# Patient Record
Sex: Female | Born: 1985 | Race: Black or African American | Hispanic: No | Marital: Single | Smoking: Former smoker
Health system: Southern US, Community
[De-identification: ages and names within clinical notes are randomized; demographics above are authoritative.]

## PROBLEM LIST (undated history)

## (undated) ENCOUNTER — Inpatient Hospital Stay (HOSPITAL_COMMUNITY): Payer: Self-pay

## (undated) DIAGNOSIS — R569 Unspecified convulsions: Secondary | ICD-10-CM

## (undated) DIAGNOSIS — J45909 Unspecified asthma, uncomplicated: Secondary | ICD-10-CM

## (undated) DIAGNOSIS — N809 Endometriosis, unspecified: Secondary | ICD-10-CM

## (undated) DIAGNOSIS — R87629 Unspecified abnormal cytological findings in specimens from vagina: Secondary | ICD-10-CM

## (undated) DIAGNOSIS — N812 Incomplete uterovaginal prolapse: Secondary | ICD-10-CM

## (undated) DIAGNOSIS — R102 Pelvic and perineal pain: Secondary | ICD-10-CM

## (undated) DIAGNOSIS — N926 Irregular menstruation, unspecified: Secondary | ICD-10-CM

## (undated) DIAGNOSIS — Z3490 Encounter for supervision of normal pregnancy, unspecified, unspecified trimester: Secondary | ICD-10-CM

## (undated) HISTORY — DX: Endometriosis, unspecified: N80.9

## (undated) HISTORY — PX: LEEP: SHX91

## (undated) HISTORY — DX: Incomplete uterovaginal prolapse: N81.2

## (undated) HISTORY — DX: Pelvic and perineal pain: R10.2

## (undated) HISTORY — DX: Unspecified convulsions: R56.9

## (undated) HISTORY — DX: Encounter for supervision of normal pregnancy, unspecified, unspecified trimester: Z34.90

## (undated) HISTORY — DX: Irregular menstruation, unspecified: N92.6

## (undated) HISTORY — DX: Unspecified abnormal cytological findings in specimens from vagina: R87.629

---
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
## 2003-05-15 HISTORY — PX: OTHER SURGICAL HISTORY: SHX169

## 2005-06-07 ENCOUNTER — Emergency Department (HOSPITAL_COMMUNITY): Admission: EM | Admit: 2005-06-07 | Discharge: 2005-06-07 | Payer: Self-pay | Admitting: Emergency Medicine

## 2011-09-04 DIAGNOSIS — T782XXA Anaphylactic shock, unspecified, initial encounter: Secondary | ICD-10-CM | POA: Insufficient documentation

## 2011-09-04 HISTORY — DX: Anaphylactic shock, unspecified, initial encounter: T78.2XXA

## 2012-07-13 ENCOUNTER — Encounter (HOSPITAL_COMMUNITY): Payer: Self-pay | Admitting: Emergency Medicine

## 2012-07-13 ENCOUNTER — Emergency Department (HOSPITAL_COMMUNITY)
Admission: EM | Admit: 2012-07-13 | Discharge: 2012-07-13 | Disposition: A | Discharge status: Home / Self Care | Payer: Medicaid Other | Attending: Emergency Medicine | Admitting: Emergency Medicine

## 2012-07-13 ENCOUNTER — Emergency Department (HOSPITAL_COMMUNITY): Payer: Medicaid Other

## 2012-07-13 DIAGNOSIS — Y9289 Other specified places as the place of occurrence of the external cause: Secondary | ICD-10-CM | POA: Insufficient documentation

## 2012-07-13 DIAGNOSIS — S93409A Sprain of unspecified ligament of unspecified ankle, initial encounter: Secondary | ICD-10-CM | POA: Insufficient documentation

## 2012-07-13 DIAGNOSIS — Y936A Activity, physical games generally associated with school recess, summer camp and children: Secondary | ICD-10-CM | POA: Insufficient documentation

## 2012-07-13 DIAGNOSIS — X500XXA Overexertion from strenuous movement or load, initial encounter: Secondary | ICD-10-CM | POA: Insufficient documentation

## 2012-07-13 DIAGNOSIS — R296 Repeated falls: Secondary | ICD-10-CM | POA: Insufficient documentation

## 2012-07-13 DIAGNOSIS — F17200 Nicotine dependence, unspecified, uncomplicated: Secondary | ICD-10-CM | POA: Insufficient documentation

## 2012-07-13 MED ORDER — IBUPROFEN 600 MG PO TABS: 600.0000 mg | ORAL_TABLET | Freq: Four times a day (QID) | ORAL | Status: DC | PRN

## 2012-07-13 MED ORDER — HYDROCODONE-ACETAMINOPHEN 5-325 MG PO TABS: 1.0000 | ORAL_TABLET | ORAL | Status: DC | PRN

## 2012-07-13 MED ORDER — HYDROCODONE-ACETAMINOPHEN 5-325 MG PO TABS
1.0000 | ORAL_TABLET | Freq: Once | ORAL | Status: AC
  Administered 2012-07-13: 1 via ORAL
  Filled 2012-07-13: qty 1

## 2012-07-13 NOTE — ED Notes (Signed)
Pt c/o right ankle pain/swelling after falling while playing kickball Friday.

## 2012-07-13 NOTE — ED Provider Notes (Signed)
History     CSN: [id number]  Arrival date & time 07/13/12  0902   First MD Initiated Contact with Patient 07/13/12 [phone]      Chief Complaint  Patient presents with  . Ankle Pain    (Consider location/radiation/quality/duration/timing/severity/associated sxs/prior treatment) HPI Comments: Valerie Huber is [name] presenting with right lateral ankle pain,  Swelling and bruising which has been progressively worsened since she rolled the ankle 2 days ago while playing kickball with her children.  She had mild pain yesterday which has worsened today.  She has minimized weight bearing today but pain is constant, sharp, and does not radiate,  Even at rest.  She has taken no medications prior to arrival.  She is visiting her family here,  And plans to go back to her home in [place] in 1 week.     The history is provided by the patient.    History reviewed. No pertinent past medical history.  History reviewed. No pertinent past surgical history.  No family history on file.  History  Substance Use Topics  . Smoking status: Current Every Day Smoker    Types: Cigarettes  . Smokeless tobacco: Not on file  . Alcohol Use: No    OB History   Grav Para Term Preterm Abortions TAB SAB Ect Mult Living                  Review of Systems  Musculoskeletal: Positive for joint swelling and arthralgias.  Skin: Negative for wound.  Neurological: Negative for weakness and numbness.    Allergies  Review of patient's allergies indicates no known allergies.  Home Medications   Current Outpatient Rx  Name  Route  Sig  Dispense  Refill  . HYDROcodone-acetaminophen (NORCO/VICODIN) 5-325 MG per tablet   Oral   Take 1 tablet by mouth every 4 (four) hours as needed for pain.   12 tablet   0   . ibuprofen (ADVIL,MOTRIN) 600 MG tablet   Oral   Take 1 tablet (600 mg total) by mouth every 6 (six) hours as needed for pain.   30 tablet   0     BP 132/80  Pulse 97  Temp(Src) 98  F (36.7 C)  Resp 20  Ht 5\' 4"  (1.626 m)  Wt 145 lb (65.772 kg)  BMI 24.88 kg/m2  SpO2 100%  LMP 06/29/2012  Physical Exam  Nursing note and vitals reviewed. Constitutional: She appears well-developed and well-nourished.  HENT:  Head: Normocephalic.  Cardiovascular: Normal rate and intact distal pulses.  Exam reveals no decreased pulses.   Pulses:      Dorsalis pedis pulses are 2+ on the right side, and 2+ on the left side.       Posterior tibial pulses are 2+ on the right side, and 2+ on the left side.  Musculoskeletal: She exhibits edema and tenderness.       Right ankle: She exhibits decreased range of motion, swelling and ecchymosis. She exhibits normal pulse. Tenderness. Lateral malleolus tenderness found. No head of 5th metatarsal and no proximal fibula tenderness found. Achilles tendon normal.  Neurological: She is alert. No sensory deficit.  Skin: Skin is warm, dry and intact.    ED Course  Procedures (including critical care time)  Labs Reviewed - No data to display Dg Ankle Complete Right  07/13/2012  *RADIOLOGY REPORT*  Clinical Data: Pain, swelling.  RIGHT ANKLE - COMPLETE 3+ VIEW  Comparison: None.  Findings: Spurring noted along the medial  talus, likely sequelae of old injury.  I see no acute fracture.  Joint spaces are maintained. Soft tissues are intact.  No subluxation or dislocation.  IMPRESSION: No acute bony abnormality.   Original Report Authenticated By: Charlett Nose, M.D.      1. Ankle sprain and strain, right, initial encounter       MDM  Pt placed in aso,  Crutches supplied.  Hydrocodone and ibuprofen prescribed.  Encouraged RICE,  Recheck by pcp in 1 week if not improved.    Patients labs and/or radiological studies were reviewed during the medical decision making and disposition process.         [name], PA 07/13/12 1622

## 2012-07-15 NOTE — ED Provider Notes (Signed)
Medical screening examination/treatment/procedure(s) were performed by non-physician practitioner and as supervising physician I was immediately available for consultation/collaboration.   Joseph L Zammit, MD 07/15/12 1439 

## 2012-08-14 ENCOUNTER — Emergency Department (HOSPITAL_COMMUNITY): Payer: Medicaid Other

## 2012-08-14 ENCOUNTER — Emergency Department (HOSPITAL_COMMUNITY)
Admission: EM | Admit: 2012-08-14 | Discharge: 2012-08-14 | Disposition: A | Discharge status: Home / Self Care | Payer: Medicaid Other | Attending: Emergency Medicine | Admitting: Emergency Medicine

## 2012-08-14 ENCOUNTER — Encounter (HOSPITAL_COMMUNITY): Payer: Self-pay | Admitting: *Deleted

## 2012-08-14 DIAGNOSIS — S0083XA Contusion of other part of head, initial encounter: Secondary | ICD-10-CM | POA: Insufficient documentation

## 2012-08-14 DIAGNOSIS — Z79899 Other long term (current) drug therapy: Secondary | ICD-10-CM | POA: Insufficient documentation

## 2012-08-14 DIAGNOSIS — S8000XA Contusion of unspecified knee, initial encounter: Secondary | ICD-10-CM | POA: Insufficient documentation

## 2012-08-14 DIAGNOSIS — S0003XA Contusion of scalp, initial encounter: Secondary | ICD-10-CM | POA: Insufficient documentation

## 2012-08-14 DIAGNOSIS — F17200 Nicotine dependence, unspecified, uncomplicated: Secondary | ICD-10-CM | POA: Insufficient documentation

## 2012-08-14 DIAGNOSIS — S8002XA Contusion of left knee, initial encounter: Secondary | ICD-10-CM

## 2012-08-14 MED ORDER — HYDROCODONE-ACETAMINOPHEN 5-325 MG PO TABS
1.0000 | ORAL_TABLET | ORAL | Status: DC | PRN
Start: 2012-08-14 — End: 2012-11-16

## 2012-08-14 MED ORDER — IBUPROFEN 600 MG PO TABS: 600.0000 mg | ORAL_TABLET | Freq: Four times a day (QID) | ORAL | Status: DC | PRN
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
## 2012-08-15 NOTE — ED Provider Notes (Signed)
Medical screening examination/treatment/procedure(s) were performed by non-physician practitioner and as supervising physician I was immediately available for consultation/collaboration.   Dione Booze, MD 08/15/12 [phone]

## 2012-10-31 ENCOUNTER — Emergency Department (HOSPITAL_COMMUNITY)
Admission: EM | Admit: 2012-10-31 | Discharge: 2012-10-31 | Disposition: A | Discharge status: Home / Self Care | Payer: Medicaid Other | Attending: Emergency Medicine | Admitting: Emergency Medicine

## 2012-10-31 ENCOUNTER — Encounter (HOSPITAL_COMMUNITY): Payer: Self-pay

## 2012-10-31 DIAGNOSIS — Y92410 Unspecified street and highway as the place of occurrence of the external cause: Secondary | ICD-10-CM | POA: Insufficient documentation

## 2012-10-31 DIAGNOSIS — S161XXA Strain of muscle, fascia and tendon at neck level, initial encounter: Secondary | ICD-10-CM

## 2012-10-31 DIAGNOSIS — S0993XA Unspecified injury of face, initial encounter: Secondary | ICD-10-CM | POA: Diagnosis present

## 2012-10-31 DIAGNOSIS — F17200 Nicotine dependence, unspecified, uncomplicated: Secondary | ICD-10-CM | POA: Insufficient documentation

## 2012-10-31 DIAGNOSIS — Y9389 Activity, other specified: Secondary | ICD-10-CM | POA: Diagnosis not present

## 2012-10-31 DIAGNOSIS — S139XXA Sprain of joints and ligaments of unspecified parts of neck, initial encounter: Secondary | ICD-10-CM | POA: Diagnosis not present

## 2012-10-31 MED ORDER — HYDROCODONE-ACETAMINOPHEN 5-325 MG PO TABS: 1.0000 | ORAL_TABLET | ORAL | Status: DC | PRN

## 2012-10-31 MED ORDER — IBUPROFEN 800 MG PO TABS: 800.0000 mg | ORAL_TABLET | Freq: Three times a day (TID) | ORAL | Status: DC

## 2012-10-31 MED ORDER — CYCLOBENZAPRINE HCL 5 MG PO TABS: 5.0000 mg | ORAL_TABLET | Freq: Three times a day (TID) | ORAL | Status: DC | PRN
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
## 2012-11-01 NOTE — ED Provider Notes (Signed)
Medical screening examination/treatment/procedure(s) were performed by non-physician practitioner and as supervising physician I was immediately available for consultation/collaboration.   Ala Capri W. Faiza Bansal, MD 11/01/12 1549 
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
## 2012-11-16 ENCOUNTER — Encounter (HOSPITAL_COMMUNITY): Payer: Self-pay | Admitting: Emergency Medicine

## 2012-11-16 ENCOUNTER — Emergency Department (HOSPITAL_COMMUNITY)
Admission: EM | Admit: 2012-11-16 | Discharge: 2012-11-16 | Discharge status: Against Medical Advice | Payer: Medicaid Other | Attending: Emergency Medicine | Admitting: Emergency Medicine

## 2012-11-16 ENCOUNTER — Emergency Department (HOSPITAL_COMMUNITY): Payer: Medicaid Other

## 2012-11-16 DIAGNOSIS — F17200 Nicotine dependence, unspecified, uncomplicated: Secondary | ICD-10-CM | POA: Insufficient documentation

## 2012-11-16 DIAGNOSIS — M79609 Pain in unspecified limb: Secondary | ICD-10-CM | POA: Insufficient documentation

## 2012-11-16 DIAGNOSIS — Z79899 Other long term (current) drug therapy: Secondary | ICD-10-CM | POA: Insufficient documentation

## 2012-11-16 DIAGNOSIS — Z791 Long term (current) use of non-steroidal anti-inflammatories (NSAID): Secondary | ICD-10-CM | POA: Insufficient documentation

## 2012-11-16 DIAGNOSIS — T07XXXA Unspecified multiple injuries, initial encounter: Secondary | ICD-10-CM

## 2012-11-16 DIAGNOSIS — R233 Spontaneous ecchymoses: Secondary | ICD-10-CM | POA: Insufficient documentation

## 2012-11-16 LAB — COMPREHENSIVE METABOLIC PANEL
ALT: 10 U/L (ref 0–35)
Alkaline Phosphatase: 49 U/L (ref 39–117)
BUN: 7 mg/dL (ref 6–23)
CO2: 28 mEq/L (ref 19–32)
Chloride: 104 mEq/L (ref 96–112)
GFR calc Af Amer: >90 mL/min (ref >90)
GFR calc non Af Amer: >90 mL/min (ref >90)
Glucose, Bld: 87 mg/dL (ref 70–99)
Potassium: 3.9 mEq/L (ref 3.5–5.1)
Sodium: 139 mEq/L (ref 135–145)
Total Bilirubin: 0.2 mg/dL — ABNORMAL LOW (ref 0.3–1.2)
Total Protein: 7.4 g/dL (ref 6.0–8.3)

## 2012-11-16 LAB — CBC WITH DIFFERENTIAL/PLATELET
Hemoglobin: 11.8 g/dL — ABNORMAL LOW (ref 12.0–15.0)
Lymphocytes Relative: 47 % — ABNORMAL HIGH (ref 12–46)
Lymphs Abs: 2.5 10*3/uL (ref 0.7–4.0)
MCH: 32.3 pg (ref 26.0–34.0)
Monocytes Relative: 9 % (ref 3–12)
Neutro Abs: 2.3 10*3/uL (ref 1.7–7.7)
Neutrophils Relative %: 43 % (ref 43–77)
Platelets: 238 10*3/uL (ref 150–400)
RBC: 3.65 MIL/uL — ABNORMAL LOW (ref 3.87–5.11)
WBC: 5.2 10*3/uL (ref 4.0–10.5)
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
## 2012-11-16 NOTE — ED Provider Notes (Signed)
History    CSN: [id number] Arrival date & time 11/16/12  1143  First MD Initiated Contact with Patient 11/16/12 1314     Chief Complaint  Patient presents with  . Headache  . Leg Pain   (Consider location/radiation/quality/duration/timing/severity/associated sxs/prior Treatment) Patient is a 27 y.o. female presenting with headaches and leg pain. The history is provided by the patient (pt complains of a headache and bruising to her legs).  Headache Pain location:  Generalized Quality:  Dull Radiates to:  Does not radiate Severity currently:  4/10 Severity at highest:  6/10 Onset quality:  Gradual Timing:  Constant Progression:  Unchanged Chronicity:  New Context: not activity   Associated symptoms: no abdominal pain, no back pain, no congestion, no cough, no diarrhea, no fatigue, no seizures and no sinus pressure   Leg Pain Associated symptoms: no back pain and no fatigue    History reviewed. No pertinent past medical history. Past Surgical History  Procedure Laterality Date  . Surgery for endometriosis     History reviewed. No pertinent family history. History  Substance Use Topics  . Smoking status: Current Every Day Smoker    Types: Cigarettes  . Smokeless tobacco: Not on file  . Alcohol Use: No   OB History   Grav Para Term Preterm Abortions TAB SAB Ect Mult Living                 Review of Systems  Constitutional: Negative for appetite change and fatigue.  HENT: Negative for congestion, sinus pressure and ear discharge.   Eyes: Negative for discharge.  Respiratory: Negative for cough.   Cardiovascular: Negative for chest pain.  Gastrointestinal: Negative for abdominal pain and diarrhea.  Genitourinary: Negative for frequency and hematuria.  Musculoskeletal: Negative for back pain.       Bruising to legs  Skin: Negative for rash.  Neurological: Positive for headaches. Negative for seizures.  Psychiatric/Behavioral: Negative for hallucinations.     Allergies  Review of patient's allergies indicates no known allergies.  Home Medications   Current Outpatient Rx  Name  Route  Sig  Dispense  Refill  . albuterol (PROVENTIL HFA;VENTOLIN HFA) 108 (90 BASE) MCG/ACT inhaler   Inhalation   Inhale 2 puffs into the lungs every 6 (six) hours as needed for wheezing.         . cyclobenzaprine (FLEXERIL) 5 MG tablet   Oral   Take 1 tablet (5 mg total) by mouth 3 (three) times daily as needed for muscle spasms.   15 tablet   0   . HYDROcodone-acetaminophen (NORCO/VICODIN) 5-325 MG per tablet   Oral   Take 1 tablet by mouth every 4 (four) hours as needed for pain.   15 tablet   0   . ibuprofen (ADVIL,MOTRIN) 800 MG tablet   Oral   Take 1 tablet (800 mg total) by mouth 3 (three) times daily.   21 tablet   0    BP 133/84  Pulse 77  Temp(Src) 98.7 F (37.1 C) (Oral)  Resp 18  Ht 5\' 4"  (1.626 m)  Wt 138 lb (62.596 kg)  BMI 23.68 kg/m2  SpO2 100%  LMP 10/24/2012 Physical Exam  Constitutional: She is oriented to person, place, and time. She appears well-developed.  HENT:  Head: Normocephalic.  Eyes: Conjunctivae and EOM are normal. No scleral icterus.  Neck: Neck supple. No thyromegaly present.  Cardiovascular: Normal rate and regular rhythm.  Exam reveals no gallop and no friction rub.   No murmur  heard. Pulmonary/Chest: No stridor. She has no wheezes. She has no rales. She exhibits no tenderness.  Abdominal: She exhibits no distension. There is no tenderness. There is no rebound.  Musculoskeletal: Normal range of motion. She exhibits no edema.  Moderate bruising to both legs  Lymphadenopathy:    She has no cervical adenopathy.  Neurological: She is oriented to person, place, and time. Coordination normal.  Skin: No rash noted. No erythema.  Psychiatric: She has a normal mood and affect. Her behavior is normal.    ED Course  Procedures (including critical care time) Labs Reviewed  CBC WITH DIFFERENTIAL - Abnormal;  Notable for the following:    RBC 3.65 (*)    Hemoglobin 11.8 (*)    HCT 35.2 (*)    Lymphocytes Relative 47 (*)    All other components within normal limits  COMPREHENSIVE METABOLIC PANEL - Abnormal; Notable for the following:    Total Bilirubin 0.2 (*)    All other components within normal limits  URINALYSIS, ROUTINE W REFLEX MICROSCOPIC   No results found. 1. Multiple bruises     MDM    Benny Lennert, MD 11/16/12 1433

## 2012-11-26 ENCOUNTER — Emergency Department (HOSPITAL_COMMUNITY)
Admission: EM | Admit: 2012-11-26 | Discharge: 2012-11-26 | Disposition: A | Discharge status: Home / Self Care | Payer: Medicaid Other | Attending: Emergency Medicine | Admitting: Emergency Medicine

## 2012-11-26 ENCOUNTER — Encounter (HOSPITAL_COMMUNITY): Payer: Self-pay | Admitting: Emergency Medicine

## 2012-11-26 ENCOUNTER — Emergency Department (HOSPITAL_COMMUNITY): Payer: Medicaid Other

## 2012-11-26 DIAGNOSIS — Y936A Activity, physical games generally associated with school recess, summer camp and children: Secondary | ICD-10-CM | POA: Insufficient documentation

## 2012-11-26 DIAGNOSIS — F17200 Nicotine dependence, unspecified, uncomplicated: Secondary | ICD-10-CM | POA: Insufficient documentation

## 2012-11-26 DIAGNOSIS — S93409A Sprain of unspecified ligament of unspecified ankle, initial encounter: Secondary | ICD-10-CM | POA: Insufficient documentation

## 2012-11-26 DIAGNOSIS — X500XXA Overexertion from strenuous movement or load, initial encounter: Secondary | ICD-10-CM | POA: Insufficient documentation

## 2012-11-26 DIAGNOSIS — S93401A Sprain of unspecified ligament of right ankle, initial encounter: Secondary | ICD-10-CM

## 2012-11-26 DIAGNOSIS — J45909 Unspecified asthma, uncomplicated: Secondary | ICD-10-CM | POA: Insufficient documentation

## 2012-11-26 DIAGNOSIS — Y929 Unspecified place or not applicable: Secondary | ICD-10-CM | POA: Insufficient documentation

## 2012-11-26 DIAGNOSIS — Z79899 Other long term (current) drug therapy: Secondary | ICD-10-CM | POA: Insufficient documentation

## 2012-11-26 HISTORY — DX: Unspecified asthma, uncomplicated: J45.909

## 2012-11-26 MED ORDER — OXYCODONE-ACETAMINOPHEN 5-325 MG PO TABS: 1.0000 | ORAL_TABLET | Freq: Four times a day (QID) | ORAL | Status: DC | PRN

## 2012-11-26 NOTE — ED Provider Notes (Signed)
   History    CSN: [id number] Arrival date & time 11/26/12  1022  First MD Initiated Contact with Patient 11/26/12 1037     Chief Complaint  Patient presents with  . Ankle Injury   (Consider location/radiation/quality/duration/timing/severity/associated sxs/prior Treatment) HPI  SUBJECTIVE: Valerie Huber is a 27 y.o. female who complains of inversion injury to the right ankle 3 day(s) ago. Immediate symptoms: immediate pain, immediate swelling, inability to bear weight directly after injury, no deformity was noted by the patient. Symptoms have been acute since that time. Prior history of related problems: previous foot/ankle injury (previous sprain). There is pain and swelling at the lateral aspect of that ankle.  Denies numbness or tingling. Able to bear weight but it causes sig. Pain. Low dose advil without relief. Patient using home brace with no relief.  Past Medical History  Diagnosis Date  . Asthma    Past Surgical History  Procedure Laterality Date  . Surgery for endometriosis     No family history on file. History  Substance Use Topics  . Smoking status: Current Every Day Smoker    Types: Cigarettes  . Smokeless tobacco: Not on file  . Alcohol Use: No   OB History   Grav Para Term Preterm Abortions TAB SAB Ect Mult Living                 Review of Systems  Allergies  Review of patient's allergies indicates no known allergies.  Home Medications   Current Outpatient Rx  Name  Route  Sig  Dispense  Refill  . cyclobenzaprine (FLEXERIL) 5 MG tablet   Oral   Take 1 tablet (5 mg total) by mouth 3 (three) times daily as needed for muscle spasms.   15 tablet   0   . ibuprofen (ADVIL,MOTRIN) 800 MG tablet   Oral   Take 1 tablet (800 mg total) by mouth 3 (three) times daily.   21 tablet   0   . albuterol (PROVENTIL HFA;VENTOLIN HFA) 108 (90 BASE) MCG/ACT inhaler   Inhalation   Inhale 2 puffs into the lungs every 6 (six) hours as needed for wheezing.           BP 115/81  Pulse 85  Temp(Src) 98.1 F (36.7 C) (Oral)  SpO2 100%  LMP 10/16/2012 Physical Exam She appears well, vital signs are normal.  CV: RRR, No M/R/G, Peripheral pulses intact. No peripheral edema. Lungs: CTAB Abd: Soft, Non tender, non distended There is swelling, ecchymosis, and tenderness over the lateral malleolus. No tenderness over the medial aspect of the ankle. The fifth metatarsal is tender. The ankle joint is intact without excessive opening on stressing.  TTP over the distal fibula.  ED Course  Procedures (including critical care time) Labs Reviewed - No data to display No results found. 1. Ankle sprain, right, initial encounter     MDM  11:05 AM BP 115/81  Pulse 85  Temp(Src) 98.1 F (36.7 C) (Oral)  SpO2 100%  LMP 10/16/2012 Imaging ordered. [place] ankle rules suggest imaging required.   No fissures or dislocations. We'll treat with Cam Walker boot pain meds followup at [facility].The patient appears reasonably screened and/or stabilized for discharge and I doubt any other medical condition or other [facility] requiring further screening, evaluation, or treatment in the ED at this time prior to discharge.   Arthor Captain, PA-C 11/26/12 1211

## 2012-11-26 NOTE — ED Notes (Signed)
Pt injured right ankle while playing kick-Valerie Huber 3 days ago. Right ankle swollen and discolored.

## 2012-11-26 NOTE — ED Provider Notes (Signed)
Medical screening examination/treatment/procedure(s) were performed by non-physician practitioner and as supervising physician I was immediately available for consultation/collaboration.   Yuriana Gaal, MD 11/26/12 1511 

## 2012-12-25 ENCOUNTER — Emergency Department (HOSPITAL_COMMUNITY)
Admission: EM | Admit: 2012-12-25 | Discharge: 2012-12-25 | Disposition: A | Discharge status: Home / Self Care | Payer: Medicaid Other | Attending: Emergency Medicine | Admitting: Emergency Medicine

## 2012-12-25 ENCOUNTER — Emergency Department (HOSPITAL_COMMUNITY): Payer: Medicaid Other

## 2012-12-25 ENCOUNTER — Encounter (HOSPITAL_COMMUNITY): Payer: Self-pay | Admitting: Emergency Medicine

## 2012-12-25 DIAGNOSIS — Y9289 Other specified places as the place of occurrence of the external cause: Secondary | ICD-10-CM | POA: Insufficient documentation

## 2012-12-25 DIAGNOSIS — Z3202 Encounter for pregnancy test, result negative: Secondary | ICD-10-CM | POA: Insufficient documentation

## 2012-12-25 DIAGNOSIS — Z79899 Other long term (current) drug therapy: Secondary | ICD-10-CM | POA: Insufficient documentation

## 2012-12-25 DIAGNOSIS — S139XXA Sprain of joints and ligaments of unspecified parts of neck, initial encounter: Secondary | ICD-10-CM | POA: Insufficient documentation

## 2012-12-25 DIAGNOSIS — S161XXA Strain of muscle, fascia and tendon at neck level, initial encounter: Secondary | ICD-10-CM

## 2012-12-25 DIAGNOSIS — J45909 Unspecified asthma, uncomplicated: Secondary | ICD-10-CM | POA: Insufficient documentation

## 2012-12-25 DIAGNOSIS — Y9389 Activity, other specified: Secondary | ICD-10-CM | POA: Insufficient documentation

## 2012-12-25 DIAGNOSIS — F17200 Nicotine dependence, unspecified, uncomplicated: Secondary | ICD-10-CM | POA: Insufficient documentation

## 2012-12-25 LAB — POCT PREGNANCY, URINE: Preg Test, Ur: NEGATIVE

## 2012-12-25 MED ORDER — NAPROXEN 500 MG PO TABS: 500.0000 mg | ORAL_TABLET | Freq: Two times a day (BID) | ORAL | Status: DC

## 2012-12-25 MED ORDER — METHOCARBAMOL 500 MG PO TABS: ORAL_TABLET | ORAL | Status: DC
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
## 2012-12-25 NOTE — Progress Notes (Signed)
ED/CM noted patient did not have health insurance and/or PCP listed in the computer.  Patient was given the [place] resource handout with information on the clinics, food pantries, and the handout for new health insurance sign-up.  Patient expressed appreciation for this. 
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
## 2012-12-26 NOTE — ED Provider Notes (Signed)
Medical screening examination/treatment/procedure(s) were performed by non-physician practitioner and as supervising physician I was immediately available for consultation/collaboration.    Brett Soza D Karren Newland, MD 12/26/12 0933 

## 2013-01-27 ENCOUNTER — Emergency Department (HOSPITAL_COMMUNITY)
Admission: EM | Admit: 2013-01-27 | Discharge: 2013-01-27 | Discharge status: Against Medical Advice | Payer: Medicaid Other | Attending: Emergency Medicine | Admitting: Emergency Medicine

## 2013-01-27 ENCOUNTER — Encounter (HOSPITAL_COMMUNITY): Payer: Self-pay

## 2013-01-27 DIAGNOSIS — R634 Abnormal weight loss: Secondary | ICD-10-CM | POA: Insufficient documentation

## 2013-01-27 DIAGNOSIS — N898 Other specified noninflammatory disorders of vagina: Secondary | ICD-10-CM | POA: Insufficient documentation

## 2013-01-27 DIAGNOSIS — F17200 Nicotine dependence, unspecified, uncomplicated: Secondary | ICD-10-CM | POA: Insufficient documentation

## 2013-01-27 NOTE — ED Notes (Signed)
Pt told registration she was leaving and will come back later.

## 2013-01-27 NOTE — ED Notes (Signed)
Pt reports has been having abnormal vaginal bleeding for the past year.  Reports her periods have been lasting approx 16 days and are very heavy.  Also reports has lost approx 22lb in the past month without trying.  Reports decreased appetite and when does eat, she vomits.  Pt says she just drinks water.

## 2013-02-09 ENCOUNTER — Emergency Department (HOSPITAL_COMMUNITY)
Admission: EM | Admit: 2013-02-09 | Discharge: 2013-02-09 | Disposition: A | Discharge status: Home / Self Care | Payer: Medicaid Other | Attending: Emergency Medicine | Admitting: Emergency Medicine

## 2013-02-09 ENCOUNTER — Emergency Department (HOSPITAL_COMMUNITY): Payer: Medicaid Other

## 2013-02-09 ENCOUNTER — Encounter (HOSPITAL_COMMUNITY): Payer: Self-pay | Admitting: *Deleted

## 2013-02-09 DIAGNOSIS — S2020XA Contusion of thorax, unspecified, initial encounter: Secondary | ICD-10-CM | POA: Insufficient documentation

## 2013-02-09 DIAGNOSIS — Z3202 Encounter for pregnancy test, result negative: Secondary | ICD-10-CM | POA: Insufficient documentation

## 2013-02-09 DIAGNOSIS — Z791 Long term (current) use of non-steroidal anti-inflammatories (NSAID): Secondary | ICD-10-CM | POA: Insufficient documentation

## 2013-02-09 DIAGNOSIS — S298XXA Other specified injuries of thorax, initial encounter: Secondary | ICD-10-CM | POA: Insufficient documentation

## 2013-02-09 DIAGNOSIS — J45901 Unspecified asthma with (acute) exacerbation: Secondary | ICD-10-CM | POA: Insufficient documentation

## 2013-02-09 DIAGNOSIS — Z79899 Other long term (current) drug therapy: Secondary | ICD-10-CM | POA: Insufficient documentation

## 2013-02-09 DIAGNOSIS — F17200 Nicotine dependence, unspecified, uncomplicated: Secondary | ICD-10-CM | POA: Insufficient documentation

## 2013-02-09 LAB — URINALYSIS, ROUTINE W REFLEX MICROSCOPIC
Bilirubin Urine: NEGATIVE
Ketones, ur: 15 mg/dL — AB
Specific Gravity, Urine: >1.03 — ABNORMAL HIGH (ref 1.005–1.030)
Urobilinogen, UA: 0.2 mg/dL (ref 0.0–1.0)

## 2013-02-09 LAB — URINE MICROSCOPIC-ADD ON

## 2013-02-09 LAB — POCT PREGNANCY, URINE: Preg Test, Ur: NEGATIVE

## 2013-02-09 MED ORDER — IBUPROFEN 600 MG PO TABS: 600.0000 mg | ORAL_TABLET | Freq: Four times a day (QID) | ORAL | Status: DC | PRN

## 2013-02-09 MED ORDER — OXYCODONE-ACETAMINOPHEN 5-325 MG PO TABS: 1.0000 | ORAL_TABLET | ORAL | Status: DC | PRN

## 2013-02-09 MED ORDER — HYDROMORPHONE HCL PF 2 MG/ML IJ SOLN
2.0000 mg | Freq: Once | INTRAMUSCULAR | Status: AC
  Administered 2013-02-09: 2 mg via INTRAMUSCULAR
  Filled 2013-02-09: qty 1
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
## 2013-02-09 NOTE — ED Notes (Signed)
MD at bedside. 
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
## 2013-07-28 ENCOUNTER — Emergency Department (HOSPITAL_COMMUNITY)
Admission: EM | Admit: 2013-07-28 | Discharge: 2013-07-28 | Disposition: A | Discharge status: Home / Self Care | Payer: Medicaid Other | Attending: Emergency Medicine | Admitting: Emergency Medicine

## 2013-07-28 ENCOUNTER — Encounter (HOSPITAL_COMMUNITY): Payer: Self-pay | Admitting: Emergency Medicine

## 2013-07-28 ENCOUNTER — Emergency Department (HOSPITAL_COMMUNITY): Payer: Medicaid Other

## 2013-07-28 DIAGNOSIS — J45909 Unspecified asthma, uncomplicated: Secondary | ICD-10-CM | POA: Insufficient documentation

## 2013-07-28 DIAGNOSIS — S8990XA Unspecified injury of unspecified lower leg, initial encounter: Secondary | ICD-10-CM | POA: Insufficient documentation

## 2013-07-28 DIAGNOSIS — S8010XA Contusion of unspecified lower leg, initial encounter: Secondary | ICD-10-CM | POA: Insufficient documentation

## 2013-07-28 DIAGNOSIS — F17200 Nicotine dependence, unspecified, uncomplicated: Secondary | ICD-10-CM | POA: Insufficient documentation

## 2013-07-28 DIAGNOSIS — M25569 Pain in unspecified knee: Secondary | ICD-10-CM

## 2013-07-28 DIAGNOSIS — S99929A Unspecified injury of unspecified foot, initial encounter: Principal | ICD-10-CM

## 2013-07-28 DIAGNOSIS — F411 Generalized anxiety disorder: Secondary | ICD-10-CM | POA: Insufficient documentation

## 2013-07-28 DIAGNOSIS — Y9301 Activity, walking, marching and hiking: Secondary | ICD-10-CM | POA: Insufficient documentation

## 2013-07-28 DIAGNOSIS — Z79899 Other long term (current) drug therapy: Secondary | ICD-10-CM | POA: Insufficient documentation

## 2013-07-28 DIAGNOSIS — R296 Repeated falls: Secondary | ICD-10-CM | POA: Insufficient documentation

## 2013-07-28 DIAGNOSIS — Y92838 Other recreation area as the place of occurrence of the external cause: Secondary | ICD-10-CM

## 2013-07-28 DIAGNOSIS — S99919A Unspecified injury of unspecified ankle, initial encounter: Principal | ICD-10-CM

## 2013-07-28 DIAGNOSIS — Y9239 Other specified sports and athletic area as the place of occurrence of the external cause: Secondary | ICD-10-CM | POA: Insufficient documentation

## 2013-07-28 MED ORDER — KETOROLAC TROMETHAMINE 60 MG/2ML IM SOLN
60.0000 mg | Freq: Once | INTRAMUSCULAR | Status: AC
  Administered 2013-07-28: 60 mg via INTRAMUSCULAR
  Filled 2013-07-28: qty 2

## 2013-07-28 MED ORDER — HYDROCODONE-ACETAMINOPHEN 5-325 MG PO TABS: 1.0000 | ORAL_TABLET | Freq: Four times a day (QID) | ORAL | Status: DC | PRN

## 2013-07-28 NOTE — ED Notes (Signed)
Pt reports was out walking her little dog and a big dog started chasing them.  Reports fell on pavement.  Pt has bruising and scratches to r lower leg.

## 2013-07-28 NOTE — ED Provider Notes (Signed)
CSN: [id number]     Arrival date & time 07/28/13  1520 History   First MD Initiated Contact with Patient 07/28/13 1613     Chief Complaint  Patient presents with  . Leg Pain     (Consider location/radiation/quality/duration/timing/severity/associated sxs/prior Treatment) Patient is a 28 y.o. female presenting with leg pain. The history is provided by the patient. No language interpreter was used.  Leg Pain Location:  Leg Injury: yes   Leg location:  R lower leg Pain details:    Quality:  Aching   Radiates to:  Does not radiate Dislocation: no   Associated symptoms: no back pain, no fever, no muscle weakness, no numbness and no tingling   Pt is a 28 year old female who was out in the park today walking her dog when a larger dog (Rottweiler) came after her and her dog. She was able to climb unde the bleachers but watched her dog get attacked. She reports that he dog was injured and this caused her a lot of anxiety. She also reports that she fell onto her right lower leg and she has bruises and pain in her right knee. She is ambulatory however, reports that it is very painful.   Past Medical History  Diagnosis Date  . Asthma    Past Surgical History  Procedure Laterality Date  . Surgery for endometriosis     Family History  Problem Relation Age of Onset  . Cancer Mother   . Cancer Other    History  Substance Use Topics  . Smoking status: Current Every Day Smoker -- 0.05 packs/day for 14 years    Types: Cigarettes  . Smokeless tobacco: Never Used  . Alcohol Use: No   OB History   Grav Para Term Preterm Abortions TAB SAB Ect Mult Living   3 1 1  2 2    1      Review of Systems  Constitutional: Negative for fever and chills.  Musculoskeletal: Positive for myalgias. Negative for back pain, gait problem and joint swelling.  Skin: Negative for rash.  Neurological: Negative for dizziness, weakness and numbness.  All other systems reviewed and are  negative.      Allergies  Flexeril  Home Medications   Current Outpatient Rx  Name  Route  Sig  Dispense  Refill  . acetaminophen (TYLENOL) 500 MG tablet   Oral   Take 1,000 mg by mouth daily as needed for mild pain, moderate pain or fever.         [name] albuterol (PROVENTIL HFA;VENTOLIN HFA) 108 (90 BASE) MCG/ACT inhaler   Inhalation   Inhale 2 puffs into the lungs every 6 (six) hours as needed for wheezing.         [name] EPINEPHrine (EPI-PEN) 0.3 mg/0.3 mL SOAJ injection   Intramuscular   Inject 0.3 mg into the muscle once.         [name] HYDROcodone-acetaminophen (NORCO/VICODIN) 5-325 MG per tablet   Oral   Take 1-2 tablets by mouth every 6 (six) hours as needed.   15 tablet   0    BP 123/84  Pulse 84  Temp(Src) 98.6 F (37 C) (Oral)  Resp 20  SpO2 95%  LMP 07/16/2013 Physical Exam  Nursing note and vitals reviewed. Constitutional: She is oriented to person, place, and time. She appears well-developed and well-nourished. No distress.  Well-appearing   HENT:  Head: Normocephalic and atraumatic.  Eyes: Conjunctivae and EOM are normal.  Neck: Normal range of motion. Neck  supple. No JVD present. No tracheal deviation present. No thyromegaly present.  Cardiovascular: Normal rate and normal heart sounds.   Pulmonary/Chest: Effort normal and breath sounds normal. No respiratory distress. She has no wheezes.  Musculoskeletal:  Right lower, leg painful and knee with limited ROM due to pain. No numbness or tingling. No focal weakness or deficits. Distal pulses 2+, capillary refill brisk.   Lymphadenopathy:    She has no cervical adenopathy.  Neurological: She is oriented to person, place, and time.  Skin: Skin is warm and dry. No rash noted.  Right shin with ecchymosis and small scratch  Psychiatric: She has a normal mood and affect. Her behavior is normal. Judgment and thought content normal.    ED Course  Procedures (including critical care time) Labs Review Labs  Reviewed - No data to display Imaging Review No results found.   EKG Interpretation None      MDM   Final diagnoses:  Knee pain    Right knee x-ray; no fracture or dislocation. Limited ROM of right knee due to pain and discomfort. No gross deformity or swelling. Distal pulses 2+, capillary refill brisk. Ace wrap applied to right knee for support. RICE instructions given. Discussed plan of care with pt and she agrees. Prescription for hydrocodone given. Follow-up with Dr. Luna Glasgow.        Elisha Headland, NP 08/01/13 1816

## 2013-07-28 NOTE — Discharge Instructions (Signed)
Knee Effusion The medical term for having fluid in your knee is effusion. This is often due to an internal derangement of the knee. This means something is wrong inside the knee. Some of the causes of fluid in the knee may be torn cartilage, a torn ligament, or bleeding into the joint from an injury. Your knee is likely more difficult to bend and move. This is often because there is increased pain and pressure in the joint. The time it takes for recovery from a knee effusion depends on different factors, including:   Type of injury.  Your age.  Physical and medical conditions.  Rehabilitation Strategies. How long you will be away from your normal activities will depend on what kind of knee problem you have and how much damage is present. Your knee has two types of cartilage. Articular cartilage covers the bone ends and lets your knee bend and move smoothly. Two menisci, thick pads of cartilage that form a rim inside the joint, help absorb shock and stabilize your knee. Ligaments bind the bones together and support your knee joint. Muscles move the joint, help support your knee, and take stress off the joint itself. CAUSES  Often an effusion in the knee is caused by an injury to one of the menisci. This is often a tear in the cartilage. Recovery after a meniscus injury depends on how much meniscus is damaged and whether you have damaged other knee tissue. Small tears may heal on their own with conservative treatment. Conservative means rest, limited weight bearing activity and muscle strengthening exercises. Your recovery may take up to 6 weeks.  TREATMENT  Larger tears may require surgery. Meniscus injuries may be treated during arthroscopy. Arthroscopy is a procedure in which your surgeon uses a small telescope like instrument to look in your knee. Your caregiver can make a more accurate diagnosis (learning what is wrong) by performing an arthroscopic procedure. If your injury is on the inner margin  of the meniscus, your surgeon may trim the meniscus back to a smooth rim. In other cases your surgeon will try to repair a damaged meniscus with stitches (sutures). This may make rehabilitation take longer, but may provide better long term result by helping your knee keep its shock absorption capabilities. Ligaments which are completely torn usually require surgery for repair. HOME CARE INSTRUCTIONS  Use crutches as instructed.  If a brace is applied, use as directed.  Once you are home, an ice pack applied to your swollen knee may help with discomfort and help decrease swelling.  Keep your knee raised (elevated) when you are not up and around or on crutches.  Only take over-the-counter or prescription medicines for pain, discomfort, or fever as directed by your caregiver.  Your caregivers will help with instructions for rehabilitation of your knee. This often includes strengthening exercises.  You may resume a normal diet and activities as directed. SEEK MEDICAL CARE IF:   There is increased swelling in your knee.  You notice redness, swelling, or increasing pain in your knee.  An unexplained oral temperature above 102 F (38.9 C) develops. SEEK IMMEDIATE MEDICAL CARE IF:   You develop a rash.  You have difficulty breathing.  You have any allergic reactions from medications you may have been given.  There is severe pain with any motion of the knee. MAKE SURE YOU:   Understand these instructions.  Will watch your condition.  Will get help right away if you are not doing well or get worse.  Document Released: 07/21/2003 Document Revised: 07/23/2011 Document Reviewed: 09/24/2007 [facility] Patient Information 2014 [employer].   RICE: Routine Care for Injuries The routine care of many injuries includes Rest, Ice, Compression, and Elevation (RICE). HOME CARE INSTRUCTIONS  Rest is needed to allow your body to heal. Routine activities can usually be resumed when  comfortable. Injured tendons and bones can take up to 6 weeks to heal. Tendons are the cord-like structures that attach muscle to bone.  Ice following an injury helps keep the swelling down and reduces pain.  Put ice in a plastic bag.  Place a towel between your skin and the bag.  Leave the ice on for 15-20 minutes, 03-04 times a day. Do this while awake, for the first 24 to 48 hours. After that, continue as directed by your caregiver.  Compression helps keep swelling down. It also gives support and helps with discomfort. If an elastic bandage has been applied, it should be removed and reapplied every 3 to 4 hours. It should not be applied tightly, but firmly enough to keep swelling down. Watch fingers or toes for swelling, bluish discoloration, coldness, numbness, or excessive pain. If any of these problems occur, remove the bandage and reapply loosely. Contact your caregiver if these problems continue.  Elevation helps reduce swelling and decreases pain. With extremities, such as the arms, hands, legs, and feet, the injured area should be placed near or above the level of the heart, if possible. SEEK IMMEDIATE MEDICAL CARE IF:  You have persistent pain and swelling.  You develop redness, numbness, or unexpected weakness.  Your symptoms are getting worse rather than improving after several days. These symptoms may indicate that further evaluation or further X-rays are needed. Sometimes, X-rays may not show a small broken bone (fracture) until 1 week or 10 days later. Make a follow-up appointment with your caregiver. Ask when your X-ray results will be ready. Make sure you get your X-ray results. Document Released: 08/12/2000 Document Revised: 07/23/2011 Document Reviewed: 09/29/2010 [facility] Patient Information 2014 [place], [place].   Follow RICE instructions Follow-up with Dr. Luna Glasgow, make appt within the week Ibuprofen for pain or discomfort Wear ace wrap for support Take hydrocodone  for mod-severe pain

## 2013-08-02 NOTE — ED Provider Notes (Signed)
Medical screening examination/treatment/procedure(s) were performed by non-physician practitioner and as supervising physician I was immediately available for consultation/collaboration.   EKG Interpretation None       Nat Christen, MD 08/02/13 [phone]

## 2013-08-04 ENCOUNTER — Encounter (HOSPITAL_COMMUNITY): Payer: Self-pay | Admitting: Emergency Medicine

## 2013-08-04 ENCOUNTER — Emergency Department (HOSPITAL_COMMUNITY)
Admission: EM | Admit: 2013-08-04 | Discharge: 2013-08-04 | Disposition: A | Discharge status: Home / Self Care | Payer: Medicaid Other | Attending: Emergency Medicine | Admitting: Emergency Medicine

## 2013-08-04 ENCOUNTER — Emergency Department (HOSPITAL_COMMUNITY): Payer: Medicaid Other

## 2013-08-04 DIAGNOSIS — S90559A Superficial foreign body, unspecified ankle, initial encounter: Principal | ICD-10-CM

## 2013-08-04 DIAGNOSIS — S70259A Superficial foreign body, unspecified hip, initial encounter: Secondary | ICD-10-CM | POA: Insufficient documentation

## 2013-08-04 DIAGNOSIS — S70359A Superficial foreign body, unspecified thigh, initial encounter: Principal | ICD-10-CM

## 2013-08-04 DIAGNOSIS — IMO0002 Reserved for concepts with insufficient information to code with codable children: Secondary | ICD-10-CM | POA: Insufficient documentation

## 2013-08-04 DIAGNOSIS — Y9302 Activity, running: Secondary | ICD-10-CM | POA: Insufficient documentation

## 2013-08-04 DIAGNOSIS — W64XXXA Exposure to other animate mechanical forces, initial encounter: Secondary | ICD-10-CM | POA: Insufficient documentation

## 2013-08-04 DIAGNOSIS — S80251A Superficial foreign body, right knee, initial encounter: Secondary | ICD-10-CM

## 2013-08-04 DIAGNOSIS — S80859A Superficial foreign body, unspecified lower leg, initial encounter: Principal | ICD-10-CM

## 2013-08-04 DIAGNOSIS — F17200 Nicotine dependence, unspecified, uncomplicated: Secondary | ICD-10-CM | POA: Insufficient documentation

## 2013-08-04 DIAGNOSIS — J45909 Unspecified asthma, uncomplicated: Secondary | ICD-10-CM | POA: Insufficient documentation

## 2013-08-04 DIAGNOSIS — Z79899 Other long term (current) drug therapy: Secondary | ICD-10-CM | POA: Insufficient documentation

## 2013-08-04 DIAGNOSIS — Y929 Unspecified place or not applicable: Secondary | ICD-10-CM | POA: Insufficient documentation

## 2013-08-04 MED ORDER — LIDOCAINE-EPINEPHRINE 1 %-1:100000 IJ SOLN: 10.0000 mL | Freq: Once | INTRAMUSCULAR | Status: DC

## 2013-08-04 MED ORDER — IBUPROFEN 400 MG PO TABS
600.0000 mg | ORAL_TABLET | Freq: Once | ORAL | Status: AC
  Administered 2013-08-04: 600 mg via ORAL
  Filled 2013-08-04: qty 2

## 2013-08-04 MED ORDER — LORAZEPAM 1 MG PO TABS
1.0000 mg | ORAL_TABLET | Freq: Once | ORAL | Status: AC
  Administered 2013-08-04: 1 mg via ORAL
  Filled 2013-08-04: qty 1

## 2013-08-04 MED ORDER — HYDROMORPHONE HCL PF 1 MG/ML IJ SOLN
1.0000 mg | Freq: Once | INTRAMUSCULAR | Status: AC
  Administered 2013-08-04: 1 mg via INTRAMUSCULAR
  Filled 2013-08-04: qty 1

## 2013-08-04 MED ORDER — LIDOCAINE HCL (PF) 1 % IJ SOLN
INTRAMUSCULAR | Status: AC
  Administered 2013-08-04: 18:00:00
  Filled 2013-08-04: qty 5

## 2013-08-04 MED ORDER — CLINDAMYCIN HCL 300 MG PO CAPS: 300.0000 mg | ORAL_CAPSULE | Freq: Three times a day (TID) | ORAL | Status: DC

## 2013-08-04 NOTE — ED Notes (Signed)
Seen on 3/17 for same - dx with Knee effusion - c/o worsening pain since with no relief from pain meds.  Reports unable to see orthopedic until 4/7.  Pt states is unable to wait that long.  Pt tearful in triage.  Denies new injury.

## 2013-08-04 NOTE — ED Provider Notes (Addendum)
CSN: [id number]     Arrival date & time 08/04/13  1520 History  This chart was scribed for Virgel Manifold, MD,  by Stacy Gardner, ED Scribe. The patient was seen in room APFT21/APFT21 and the patient's care was started at 3:39 PM.    First MD Initiated Contact with Patient 08/04/13 1535     Chief Complaint  Patient presents with  . Knee Pain     (Consider location/radiation/quality/duration/timing/severity/associated sxs/prior Treatment) Patient is a 28 y.o. female presenting with knee pain. The history is provided by the patient and medical records. No language interpreter was used.  Knee Pain  HPI Comments: Valerie Huber is a 28 y.o. female who presents to the Emergency Department complaining of progressively worsening severe, constant right knee pain. Pt was seen last week for similar complain and was dx with knee effusion. The pain is worse since her visit and she denies any relief with medications. Pt request a another x-ray and referral to another orthopedist. Pt was running from a large dog and she hit her leg with trying to climb away from the dog. She mentions that the pain is now stabbing into her knee and worse with movement. Pt explains,  "something is not right".  Pt has to lift her leg to get into tub. Nothing seems to relieve the pain.  Past Medical History  Diagnosis Date  . Asthma    Past Surgical History  Procedure Laterality Date  . Surgery for endometriosis     Family History  Problem Relation Age of Onset  . Cancer Mother   . Cancer Other    History  Substance Use Topics  . Smoking status: Current Every Day Smoker -- 0.05 packs/day for 14 years    Types: Cigarettes  . Smokeless tobacco: Never Used  . Alcohol Use: No   OB History   Grav Para Term Preterm Abortions TAB SAB Ect Mult Living   3 1 1  2 2    1      Review of Systems  Musculoskeletal: Positive for arthralgias and myalgias.  All other systems reviewed and are  negative.      Allergies  Flexeril  Home Medications   Current Outpatient Rx  Name  Route  Sig  Dispense  Refill  . acetaminophen (TYLENOL) 500 MG tablet   Oral   Take 1,000 mg by mouth daily as needed for mild pain, moderate pain or fever.         [name] albuterol (PROVENTIL HFA;VENTOLIN HFA) 108 (90 BASE) MCG/ACT inhaler   Inhalation   Inhale 2 puffs into the lungs every 6 (six) hours as needed for wheezing.         [name] EPINEPHrine (EPI-PEN) 0.3 mg/0.3 mL SOAJ injection   Intramuscular   Inject 0.3 mg into the muscle once.         [name] HYDROcodone-acetaminophen (NORCO/VICODIN) 5-325 MG per tablet   Oral   Take 1-2 tablets by mouth every 6 (six) hours as needed.   15 tablet   0    BP 125/81  Pulse 95  Temp(Src) 98.9 F (37.2 C) (Oral)  Resp 16  Ht 5\' 4"  (1.626 m)  Wt 140 lb (63.504 kg)  BMI 24.02 kg/m2  SpO2 99%  LMP 07/16/2013 Physical Exam  Nursing note and vitals reviewed. Constitutional: She is oriented to person, place, and time. She appears well-developed and well-nourished.  HENT:  Head: Normocephalic.  Eyes: EOM are normal.  Neck: Normal range of motion.  Pulmonary/Chest: Effort  normal.  Abdominal: She exhibits no distension.  Musculoskeletal: She exhibits tenderness.  Small effusion of the right knee Severe tenderness of the anterior and lateral aspects of the right knee Sign def ROM secondary to pain NVI   Neurological: She is alert and oriented to person, place, and time.  Psychiatric: She has a normal mood and affect.    ED Course  FOREIGN BODY REMOVAL Date/Time: 08/04/2013 4:30 PM Performed by: Virgel Manifold Authorized by: Virgel Manifold Consent: Verbal consent obtained. Risks and benefits: risks, benefits and alternatives were discussed Consent given by: patient Patient identity confirmed: verbally with patient and arm band Body area: skin General location: lower extremity Location details: right knee Anesthesia: local  infiltration Local anesthetic: lidocaine 1% without epinephrine Anesthetic total: 1 ml Patient sedated: no Patient restrained: no (.) Patient cooperative: yes Localization method: probed (CT imaging) Removal mechanism: forceps and scalpel (a tiny incision was made with an #11 blade adjacent to palpated edge of FB .) Dressing: dressing applied Tendon involvement: none Depth: subcutaneous Complexity: simple 1 objects recovered. Objects recovered: ~3.8HW thin metalic object with a pointed end Post-procedure assessment: foreign body removed Patient tolerance: Patient tolerated the procedure well with no immediate complications.   (including critical care time) Labs Review Labs Reviewed - No data to display Imaging Review No results found.  Ct Knee Right Wo Contrast  08/04/2013   CLINICAL DATA:  Right knee swelling, bruising and stabbing pain after running injury last week.  EXAM: CT OF THE RIGHT KNEE WITHOUT CONTRAST  TECHNIQUE: Multidetector CT imaging was performed according to the standard protocol. Multiplanar CT image reconstructions were also generated.  COMPARISON:  Radiographs 07/28/2013.  FINDINGS: There is no evidence of acute fracture or dislocation. The joint spaces are preserved.  There is no significant joint effusion or evidence of intra-articular loose body. As evaluated by CT, the menisci, cruciate ligaments and extensor mechanism appear normal.  There is an apparent linear metallic foreign body within the subcutaneous tissues lateral to the patellar tendon. This is superficially positioned within the subcutaneous fat and may extend to the skin surface anteriorly, although does not appear to reflect something overlying the patient. It measures 1.4 cm in length and is not visible on the radiographs obtained last week.  IMPRESSION: 1. Apparent new linear metallic foreign body within the subcutaneous fat lateral to the patellar tendon. Question needle fragment. 2. No evidence of  joint effusion or intra-articular infection. 3. No evidence of acute fracture or dislocation.   Electronically Signed   By: Camie Patience M.D.   On: 08/04/2013 16:49    EKG Interpretation None      MDM   Final diagnoses:  Foreign body of knee, right, superficial    28 year old female with right knee pain. Interestingly, she has a metallic foreign body. This was not noted on recent x-rays. She adamantly denies any interim trauma or sticking herself with anything. On re-exam, the foreign body was palpated very superficially. A tiny nick in the overlying skin was made and the foreign body was extracted in its entirety. It appeared to be a broken 18g brad or other similar object with a pointed end.  No signs of infection, but will put on a short course of antibiotics.    Virgel Manifold, MD 08/09/13 [place], MD 08/13/13 [phone]

## 2013-08-04 NOTE — ED Notes (Signed)
Pain rt knee , seen here 3/17 for same. Has appt 4/7 with Dr Luna Glasgow.  Says she has "stabbing" pain rt knee that is new.  No new injury, Has an ace bandage on knee and using  Crutches.

## 2013-08-04 NOTE — ED Notes (Signed)
Dr Wilson Singer in to remove FB from knee

## 2013-08-04 NOTE — ED Notes (Signed)
Small metalic fb removed by Dr Wilson Singer. Appears to be part of a sewing needle

## 2013-11-17 ENCOUNTER — Other Ambulatory Visit (HOSPITAL_COMMUNITY): Payer: Self-pay | Admitting: Nurse Practitioner

## 2013-11-17 DIAGNOSIS — N920 Excessive and frequent menstruation with regular cycle: Secondary | ICD-10-CM

## 2013-11-20 ENCOUNTER — Ambulatory Visit (HOSPITAL_COMMUNITY)
Admission: RE | Admit: 2013-11-20 | Discharge: 2013-11-20 | Disposition: A | Discharge status: Home / Self Care | Payer: Medicaid Other | Source: Ambulatory Visit | Attending: Nurse Practitioner | Admitting: Nurse Practitioner

## 2013-11-20 DIAGNOSIS — N920 Excessive and frequent menstruation with regular cycle: Secondary | ICD-10-CM | POA: Insufficient documentation

## 2014-01-01 ENCOUNTER — Encounter (HOSPITAL_COMMUNITY): Payer: Self-pay | Admitting: Emergency Medicine

## 2014-01-01 ENCOUNTER — Emergency Department (HOSPITAL_COMMUNITY)
Admission: EM | Admit: 2014-01-01 | Discharge: 2014-01-01 | Disposition: A | Discharge status: Home / Self Care | Payer: Medicaid Other | Attending: Emergency Medicine | Admitting: Emergency Medicine

## 2014-01-01 ENCOUNTER — Emergency Department (HOSPITAL_COMMUNITY): Payer: Medicaid Other

## 2014-01-01 DIAGNOSIS — Y9289 Other specified places as the place of occurrence of the external cause: Secondary | ICD-10-CM | POA: Insufficient documentation

## 2014-01-01 DIAGNOSIS — Z792 Long term (current) use of antibiotics: Secondary | ICD-10-CM | POA: Diagnosis not present

## 2014-01-01 DIAGNOSIS — F17200 Nicotine dependence, unspecified, uncomplicated: Secondary | ICD-10-CM | POA: Insufficient documentation

## 2014-01-01 DIAGNOSIS — S93409A Sprain of unspecified ligament of unspecified ankle, initial encounter: Secondary | ICD-10-CM | POA: Insufficient documentation

## 2014-01-01 DIAGNOSIS — S99919A Unspecified injury of unspecified ankle, initial encounter: Secondary | ICD-10-CM | POA: Diagnosis present

## 2014-01-01 DIAGNOSIS — Y9366 Activity, soccer: Secondary | ICD-10-CM | POA: Diagnosis not present

## 2014-01-01 DIAGNOSIS — S93401A Sprain of unspecified ligament of right ankle, initial encounter: Secondary | ICD-10-CM

## 2014-01-01 DIAGNOSIS — J45909 Unspecified asthma, uncomplicated: Secondary | ICD-10-CM | POA: Insufficient documentation

## 2014-01-01 DIAGNOSIS — S99929A Unspecified injury of unspecified foot, initial encounter: Secondary | ICD-10-CM

## 2014-01-01 DIAGNOSIS — S8990XA Unspecified injury of unspecified lower leg, initial encounter: Secondary | ICD-10-CM | POA: Insufficient documentation

## 2014-01-01 DIAGNOSIS — X500XXA Overexertion from strenuous movement or load, initial encounter: Secondary | ICD-10-CM | POA: Insufficient documentation

## 2014-01-01 MED ORDER — NAPROXEN 500 MG PO TABS: 500.0000 mg | ORAL_TABLET | Freq: Two times a day (BID) | ORAL | Status: DC

## 2014-01-01 NOTE — ED Notes (Signed)
Pt was playing soccer last night.  Twisted ankle.  Placed ace bandage/ice and used tylenol for pain.  No relief.  Tried to go to work today and it is more difficult to walk.

## 2014-01-01 NOTE — Discharge Instructions (Signed)

## 2014-01-01 NOTE — ED Provider Notes (Signed)
CSN: [id number]     Arrival date & time 01/01/14  1227 History  This chart was scribed for Domenic Moras, PA, working with Pamella Pert, MD found by Starleen Arms, ED Scribe. This patient was seen in room WTR5/WTR5 and the patient's care was started at 12:43 PM.    Chief Complaint  Patient presents with  . Ankle Pain   The history is provided by the patient. No language interpreter was used.    HPI Comments: Valerie Huber is a 28 y.o. female who presents to the Emergency Department complaining of a right ankle injury sustained yesterday.  She states she was playing soccer with her kids when she twisted her ankle inward.  She was able to ambulate afterward but it aggravates the pain.  She states the pain worsened today and is currently rated a 9/10.  She describes it as throbbing, non radiating.  She states she has taken 6 Tylenol's since onset, iced the area, and used an ace bandage with no relief.  She states she has a history of a sprain of the affected anklye 3 years ago.  Patient has had associated mild knee pain yesterday that has since resolved.    Past Medical History  Diagnosis Date  . Asthma    Past Surgical History  Procedure Laterality Date  . Surgery for endometriosis     Family History  Problem Relation Age of Onset  . Cancer Mother   . Cancer Other    History  Substance Use Topics  . Smoking status: Current Every Day Smoker -- 0.05 packs/day for 14 years    Types: Cigarettes  . Smokeless tobacco: Never Used  . Alcohol Use: No   OB History   Grav Para Term Preterm Abortions TAB SAB Ect Mult Living   3 1 1  2 2    1      Review of Systems  Constitutional: Negative for fever.  Musculoskeletal: Positive for joint swelling.  Neurological: Negative for numbness.      Allergies  Flexeril  Home Medications   Prior to Admission medications   Medication Sig Start Date End Date Taking? Authorizing Provider  acetaminophen (TYLENOL) 500 MG tablet Take 1,000 mg  by mouth daily as needed for mild pain, moderate pain or fever.    Historical Provider, MD  albuterol (PROVENTIL HFA;VENTOLIN HFA) 108 (90 BASE) MCG/ACT inhaler Inhale 2 puffs into the lungs every 6 (six) hours as needed for wheezing.    Historical Provider, MD  clindamycin (CLEOCIN) 300 MG capsule Take 1 capsule (300 mg total) by mouth 3 (three) times daily. 08/04/13   Virgel Manifold, MD  EPINEPHrine (EPI-PEN) 0.3 mg/0.3 mL SOAJ injection Inject 0.3 mg into the muscle once.    Historical Provider, MD  HYDROcodone-acetaminophen (NORCO/VICODIN) 5-325 MG per tablet Take 1-2 tablets by mouth every 6 (six) hours as needed. 07/28/13   Elisha Headland, NP   There were no vitals taken for this visit. Physical Exam  Nursing note and vitals reviewed. Constitutional: She is oriented to person, place, and time. She appears well-developed and well-nourished. No distress.  HENT:  Head: Normocephalic and atraumatic.  Eyes: Conjunctivae and EOM are normal.  Neck: Neck supple. No tracheal deviation present.  Cardiovascular: Normal rate.   Pulmonary/Chest: Effort normal. No respiratory distress.  Musculoskeletal: Normal range of motion. She exhibits edema and tenderness.  Point tenderness to lateral malleolus with mild edema and erythema to affected area but no crepitus or obvious deformity.  Pain with dorsiflexion, plantarflexion, eversion,  and inversion.  Pedal pulses palpable with brisk cap refill.  Knees normal.    Neurological: She is alert and oriented to person, place, and time.  Skin: Skin is warm and dry.  Psychiatric: She has a normal mood and affect. Her behavior is normal.    ED Course  Procedures (including critical care time)  DIAGNOSTIC STUDIES: Oxygen Saturation is 100% on RA, normal by my interpretation.    COORDINATION OF CARE:  12:49 PM Advised patient of suspicion of ankle sprain.  Will order ankle x-ray to r/o fracture.  Patient acknowledges and agrees with plan.    1:37 PM Xray neg,  RICE therapy recommended.  ICE pack given.  Ortho referral as needed.  Pt is NVI.  ASO provided.  Pt able to ambulate. 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  Will provide note  Labs Review Labs Reviewed - No data to display  Imaging Review Dg Ankle Complete Right  01/01/2014   CLINICAL DATA:  Inverted RIGHT foot playing soccer 1 day ago, ankle pain, pain worse this morning; history of sprained ankle 2-3 years ago  EXAM: RIGHT ANKLE - COMPLETE 3+ VIEW  COMPARISON:  11/26/2012  FINDINGS: Osseous mineralization normal.  Joint spaces preserved.  No fracture, dislocation, or bone destruction.  IMPRESSION: Normal exam.   Electronically Signed   By: Lavonia Dana M.D.   On: 01/01/2014 13:19     EKG Interpretation None      MDM   Final diagnoses:  Right ankle sprain, initial encounter    BP 156/98  Pulse 113  Temp(Src) 99.6 F (37.6 C) (Oral)  Resp 18  SpO2 100%  LMP 12/23/2013  I have reviewed nursing notes and vital signs. I personally reviewed the imaging tests through PACS system  I reviewed available ER/hospitalization records thought the EMR   I personally performed the services described in this documentation, which was scribed in my presence. The recorded information has been reviewed and is accurate.     Domenic Moras, PA-C 01/01/14 2000

## 2014-01-03 NOTE — ED Provider Notes (Signed)
Medical screening examination/treatment/procedure(s) were performed by non-physician practitioner and as supervising physician I was immediately available for consultation/collaboration.   EKG Interpretation None        Pamella Pert, MD 01/03/14 [phone]

## 2014-02-01 ENCOUNTER — Emergency Department (HOSPITAL_COMMUNITY): Payer: Medicaid Other

## 2014-02-01 ENCOUNTER — Encounter (HOSPITAL_COMMUNITY): Payer: Self-pay | Admitting: Emergency Medicine

## 2014-02-01 ENCOUNTER — Emergency Department (HOSPITAL_COMMUNITY)
Admission: EM | Admit: 2014-02-01 | Discharge: 2014-02-01 | Disposition: A | Discharge status: Home / Self Care | Payer: Medicaid Other | Attending: Emergency Medicine | Admitting: Emergency Medicine

## 2014-02-01 DIAGNOSIS — J45909 Unspecified asthma, uncomplicated: Secondary | ICD-10-CM | POA: Insufficient documentation

## 2014-02-01 DIAGNOSIS — S99919A Unspecified injury of unspecified ankle, initial encounter: Secondary | ICD-10-CM

## 2014-02-01 DIAGNOSIS — S99929A Unspecified injury of unspecified foot, initial encounter: Secondary | ICD-10-CM

## 2014-02-01 DIAGNOSIS — Y929 Unspecified place or not applicable: Secondary | ICD-10-CM | POA: Diagnosis not present

## 2014-02-01 DIAGNOSIS — F17200 Nicotine dependence, unspecified, uncomplicated: Secondary | ICD-10-CM | POA: Insufficient documentation

## 2014-02-01 DIAGNOSIS — Y9389 Activity, other specified: Secondary | ICD-10-CM | POA: Diagnosis not present

## 2014-02-01 DIAGNOSIS — W010XXA Fall on same level from slipping, tripping and stumbling without subsequent striking against object, initial encounter: Secondary | ICD-10-CM | POA: Diagnosis not present

## 2014-02-01 DIAGNOSIS — S8012XA Contusion of left lower leg, initial encounter: Secondary | ICD-10-CM

## 2014-02-01 DIAGNOSIS — S8010XA Contusion of unspecified lower leg, initial encounter: Secondary | ICD-10-CM | POA: Diagnosis not present

## 2014-02-01 DIAGNOSIS — S8990XA Unspecified injury of unspecified lower leg, initial encounter: Secondary | ICD-10-CM | POA: Insufficient documentation

## 2014-02-01 MED ORDER — HYDROMORPHONE HCL 1 MG/ML IJ SOLN
1.0000 mg | Freq: Once | INTRAMUSCULAR | Status: AC
Start: 2014-02-01 — End: 2014-02-01
  Administered 2014-02-01: 1 mg via INTRAVENOUS
  Filled 2014-02-01: qty 1

## 2014-02-01 MED ORDER — ONDANSETRON HCL 4 MG/2ML IJ SOLN
4.0000 mg | Freq: Once | INTRAMUSCULAR | Status: AC
  Administered 2014-02-01: 4 mg via INTRAVENOUS
  Filled 2014-02-01: qty 2

## 2014-02-01 MED ORDER — FENTANYL CITRATE 0.05 MG/ML IJ SOLN
50.0000 ug | Freq: Once | INTRAMUSCULAR | Status: AC
  Administered 2014-02-01: 50 ug via INTRAVENOUS

## 2014-02-01 MED ORDER — LORAZEPAM 2 MG/ML IJ SOLN
0.5000 mg | Freq: Once | INTRAMUSCULAR | Status: AC
  Administered 2014-02-01: 0.5 mg via INTRAVENOUS
  Filled 2014-02-01: qty 1

## 2014-02-01 MED ORDER — FENTANYL CITRATE 0.05 MG/ML IJ SOLN
INTRAMUSCULAR | Status: AC
  Filled 2014-02-01: qty 2

## 2014-02-01 MED ORDER — HYDROCODONE-ACETAMINOPHEN 5-325 MG PO TABS: 1.0000 | ORAL_TABLET | Freq: Four times a day (QID) | ORAL | Status: DC | PRN

## 2014-02-01 NOTE — ED Notes (Signed)
Pt calling sister to come pick her up, sister works in [place].

## 2014-02-01 NOTE — ED Notes (Signed)
Pt's sister on way from [place] to pick her up.

## 2014-02-01 NOTE — ED Notes (Signed)
Pt slipped in shower and fell, deformity to left leg. Splinted on arrival by EMS.

## 2014-02-01 NOTE — Discharge Instructions (Signed)
Keep legs elevated.  Follow up with dr. Luna Glasgow in one week.

## 2014-02-02 NOTE — ED Provider Notes (Signed)
CSN: [id number]     Arrival date & time 02/01/14  1346 History   First MD Initiated Contact with Patient 02/01/14 1417     Chief Complaint  Patient presents with  . Leg Injury     (Consider location/radiation/quality/duration/timing/severity/associated sxs/prior Treatment) Patient is a 28 y.o. female presenting with fall. The history is provided by the patient (the pt fell in the bath tub and hurt her left lower leg).  Fall This is a new problem. The current episode started 1 to 2 hours ago. The problem occurs constantly. The problem has not changed since onset.Pertinent negatives include no chest pain, no abdominal pain and no headaches. The symptoms are aggravated by bending. Nothing relieves the symptoms. She has tried nothing for the symptoms.    Past Medical History  Diagnosis Date  . Asthma    Past Surgical History  Procedure Laterality Date  . Surgery for endometriosis     Family History  Problem Relation Age of Onset  . Cancer Mother   . Cancer Other    History  Substance Use Topics  . Smoking status: Current Every Day Smoker -- 0.05 packs/day for 14 years    Types: Cigarettes  . Smokeless tobacco: Never Used  . Alcohol Use: No   OB History   Grav Para Term Preterm Abortions TAB SAB Ect Mult Living   3 1 1  2 2    1      Review of Systems  Constitutional: Negative for appetite change and fatigue.  HENT: Negative for congestion, ear discharge and sinus pressure.   Eyes: Negative for discharge.  Respiratory: Negative for cough.   Cardiovascular: Negative for chest pain.  Gastrointestinal: Negative for abdominal pain and diarrhea.  Genitourinary: Negative for frequency and hematuria.  Musculoskeletal: Negative for back pain.       Left lower leg pain  Skin: Negative for rash.  Neurological: Negative for seizures and headaches.  Psychiatric/Behavioral: Negative for hallucinations.      Allergies  Flexeril and Ibuprofen  Home Medications   Prior to  Admission medications   Medication Sig Start Date End Date Taking? Authorizing Provider  acetaminophen (TYLENOL) 500 MG tablet Take 1,000 mg by mouth every 6 (six) hours as needed for moderate pain or fever (For ankle pain.).    Yes Historical Provider, MD  albuterol (PROVENTIL HFA;VENTOLIN HFA) 108 (90 BASE) MCG/ACT inhaler Inhale 2 puffs into the lungs every 6 (six) hours as needed for wheezing.   Yes Historical Provider, MD  EPINEPHrine (EPI-PEN) 0.3 mg/0.3 mL SOAJ injection Inject 0.3 mg into the muscle once.   Yes Historical Provider, MD  Multiple Vitamin (MULTIVITAMIN WITH MINERALS) TABS tablet Take 1 tablet by mouth every morning.   Yes Historical Provider, MD  HYDROcodone-acetaminophen (NORCO/VICODIN) 5-325 MG per tablet Take 1 tablet by mouth every 6 (six) hours as needed. 02/01/14   Maudry Diego, MD   BP 116/85  Pulse 68  Temp(Src) 98.5 F (36.9 C) (Oral)  Resp 18  Ht 5\' 3"  (1.6 m)  Wt 150 lb (68.04 kg)  BMI 26.58 kg/m2  SpO2 100%  LMP 02/01/2014 Physical Exam  Constitutional: She is oriented to person, place, and time. She appears well-developed.  HENT:  Head: Normocephalic.  Eyes: Conjunctivae are normal.  Neck: No tracheal deviation present.  Cardiovascular:  No murmur heard. Musculoskeletal:  Tender left lower leg anterior with swelling  Neurological: She is oriented to person, place, and time.  Skin: Skin is warm.  Psychiatric: She has a  normal mood and affect.    ED Course  Procedures (including critical care time) Labs Review Labs Reviewed - No data to display  Imaging Review Dg Tibia/fibula Left  02/01/2014   CLINICAL DATA:  LEFT lower leg pain, fell in shower today, abrasion, bruising and "goose-egg" at proximal LEFT lower leg  EXAM: LEFT TIBIA AND FIBULA - 2 VIEW  COMPARISON:  LEFT knee radiographs 08/14/2012  FINDINGS: Knee and ankle joint alignments normal.  Osseous mineralization normal.  Pretibial soft tissue swelling at proximal LEFT lower leg.  No  acute fracture, dislocation, or bone destruction.  IMPRESSION: No acute osseous abnormalities.   Electronically Signed   By: Lavonia Dana M.D.   On: 02/01/2014 14:42     EKG Interpretation None      MDM   Final diagnoses:  Contusion of leg, left, initial encounter    The chart was scribed for me under my direct supervision.  I personally performed the history, physical, and medical decision making and all procedures in the evaluation of this patient.Maudry Diego, MD 02/02/14 [phone]

## 2014-02-06 ENCOUNTER — Emergency Department (HOSPITAL_COMMUNITY)
Admission: EM | Admit: 2014-02-06 | Discharge: 2014-02-06 | Disposition: A | Discharge status: Home / Self Care | Payer: Medicaid Other | Attending: Emergency Medicine | Admitting: Emergency Medicine

## 2014-02-06 ENCOUNTER — Encounter (HOSPITAL_COMMUNITY): Payer: Self-pay | Admitting: Emergency Medicine

## 2014-02-06 DIAGNOSIS — M79609 Pain in unspecified limb: Secondary | ICD-10-CM | POA: Diagnosis present

## 2014-02-06 DIAGNOSIS — J45909 Unspecified asthma, uncomplicated: Secondary | ICD-10-CM | POA: Diagnosis not present

## 2014-02-06 DIAGNOSIS — Z79899 Other long term (current) drug therapy: Secondary | ICD-10-CM | POA: Diagnosis not present

## 2014-02-06 DIAGNOSIS — S8012XD Contusion of left lower leg, subsequent encounter: Secondary | ICD-10-CM

## 2014-02-06 DIAGNOSIS — Z792 Long term (current) use of antibiotics: Secondary | ICD-10-CM | POA: Insufficient documentation

## 2014-02-06 DIAGNOSIS — F17200 Nicotine dependence, unspecified, uncomplicated: Secondary | ICD-10-CM | POA: Insufficient documentation

## 2014-02-06 DIAGNOSIS — G8911 Acute pain due to trauma: Secondary | ICD-10-CM | POA: Insufficient documentation

## 2014-02-06 MED ORDER — ONDANSETRON 8 MG PO TBDP
8.0000 mg | ORAL_TABLET | Freq: Once | ORAL | Status: AC
  Administered 2014-02-06: 8 mg via ORAL
  Filled 2014-02-06: qty 1

## 2014-02-06 MED ORDER — CEPHALEXIN 500 MG PO CAPS: 500.0000 mg | ORAL_CAPSULE | Freq: Three times a day (TID) | ORAL | Status: DC

## 2014-02-06 MED ORDER — HYDROMORPHONE HCL 1 MG/ML IJ SOLN
1.0000 mg | Freq: Once | INTRAMUSCULAR | Status: AC
  Administered 2014-02-06: 1 mg via INTRAMUSCULAR
  Filled 2014-02-06: qty 1

## 2014-02-06 MED ORDER — OXYCODONE-ACETAMINOPHEN 7.5-325 MG PO TABS: 1.0000 | ORAL_TABLET | Freq: Four times a day (QID) | ORAL | Status: DC | PRN

## 2014-02-06 NOTE — ED Notes (Signed)
Patient with no complaints at this time. Respirations even and unlabored. Skin warm/dry. Discharge instructions reviewed with patient at this time. Patient given opportunity to voice concerns/ask questions. Patient discharged at this time and left Emergency Department with steady gait.   

## 2014-02-06 NOTE — Discharge Instructions (Signed)
Contusion °A contusion is a deep bruise. Contusions happen when an injury causes bleeding under the skin. Signs of bruising include pain, puffiness (swelling), and discolored skin. The contusion may turn blue, purple, or yellow. °HOME CARE  °· Put ice on the injured area. °¨ Put ice in a plastic bag. °¨ Place a towel between your skin and the bag. °¨ Leave the ice on for 15-20 minutes, 03-04 times a day. °· Only take medicine as told by your doctor. °· Rest the injured area. °· If possible, raise (elevate) the injured area to lessen puffiness. °GET HELP RIGHT AWAY IF:  °· You have more bruising or puffiness. °· You have pain that is getting worse. °· Your puffiness or pain is not helped by medicine. °MAKE SURE YOU:  °· Understand these instructions. °· Will watch your condition. °· Will get help right away if you are not doing well or get worse. °Document Released: 10/17/2007 Document Revised: 07/23/2011 Document Reviewed: 03/05/2011 °ExitCare® Patient Information ©2015 [employer]. This information is not intended to replace advice given to you by your health care provider. Make sure you discuss any questions you have with your health care provider. ° °

## 2014-02-06 NOTE — ED Notes (Signed)
Pt was seen in ED on 9/21 for a leg injury. Tried to make appt with Dr. Luna Glasgow but needs a referral from a primary care doctor. Come in today because of increased pain to Valerie Huber left leg. The pain medication she was given on 9/21 is not providing any relief from the pain.

## 2014-02-08 NOTE — ED Provider Notes (Signed)
CSN: [id number]     Arrival date & time 02/06/14  1749 History   First MD Initiated Contact with Patient 02/06/14 1803     Chief Complaint  Patient presents with  . Leg Pain     (Consider location/radiation/quality/duration/timing/severity/associated sxs/prior Treatment) HPI  Valerie Huber is a 28 y.o. female who presents to the Emergency Department complaining of pain to her left lower leg.  Patient was seen here on 02/01/14 after a after from a bathub in which she struck her left lower leg on the edge of the tub as she fell.  She reports continued pain to the front of her left lower leg with bruising.  She has been taking her prescribed pain medication, but states it does nothing to help control the pain despite taking two every four hours.  She was advised to f/u with Dr. Brooke Bonito office and she states that when she contacted his office, she was told she needed a referral from her PCP in order to be seen and her PCP cannot see her for another week.  She denies fever, chills, redness, numbness or weakness of the leg.  Pain is worse with movement of her foot and weight bearing.     Past Medical History  Diagnosis Date  . Asthma    Past Surgical History  Procedure Laterality Date  . Surgery for endometriosis     Family History  Problem Relation Age of Onset  . Cancer Mother   . Cancer Other    History  Substance Use Topics  . Smoking status: Current Every Day Smoker -- 0.05 packs/day for 14 years    Types: Cigarettes  . Smokeless tobacco: Never Used  . Alcohol Use: No   OB History   Grav Para Term Preterm Abortions TAB SAB Ect Mult Living   3 1 1  2 2    1      Review of Systems  Constitutional: Negative for fever and chills.  Respiratory: Negative for shortness of breath.   Genitourinary: Negative for dysuria and difficulty urinating.  Musculoskeletal: Positive for arthralgias and myalgias. Negative for joint swelling.  Skin: Negative for wound.  All other systems  reviewed and are negative.     Allergies  Flexeril and Ibuprofen  Home Medications   Prior to Admission medications   Medication Sig Start Date End Date Taking? Authorizing Provider  acetaminophen (TYLENOL) 500 MG tablet Take 1,000 mg by mouth every 6 (six) hours as needed for moderate pain or fever (For ankle pain.).    Yes Historical Provider, MD  HYDROcodone-acetaminophen (NORCO/VICODIN) 5-325 MG per tablet Take 1 tablet by mouth every 6 (six) hours as needed. 02/01/14  Yes Maudry Diego, MD  Multiple Vitamin (MULTIVITAMIN WITH MINERALS) TABS tablet Take 1 tablet by mouth daily.    Yes Historical Provider, MD  cephALEXin (KEFLEX) 500 MG capsule Take 1 capsule (500 mg total) by mouth 3 (three) times daily. 02/06/14   Janica Eldred L. Thalya Fouche, PA-C  EPINEPHrine (EPI-PEN) 0.3 mg/0.3 mL SOAJ injection Inject 0.3 mg into the muscle once.    Historical Provider, MD  oxyCODONE-acetaminophen (PERCOCET) 7.5-325 MG per tablet Take 1 tablet by mouth every 6 (six) hours as needed for pain. 02/06/14   Jevon Littlepage L. Bettyanne Dittman, PA-C   BP 135/100  Pulse 86  Temp(Src) 99.3 F (37.4 C) (Oral)  Resp 20  Ht 5\' 4"  (1.626 m)  Wt 145 lb (65.772 kg)  BMI 24.88 kg/m2  SpO2 100%  LMP 02/01/2014 Physical Exam  Nursing note  and vitals reviewed. Constitutional: She is oriented to person, place, and time. She appears well-developed.  Pt is tearful  HENT:  Head: Normocephalic and atraumatic.  Cardiovascular: Normal rate, regular rhythm, normal heart sounds and intact distal pulses.   No murmur heard. Pulmonary/Chest: Effort normal and breath sounds normal. No respiratory distress.  Musculoskeletal: She exhibits tenderness.       Left lower leg: She exhibits tenderness and swelling. She exhibits no bony tenderness, no deformity and no laceration.       Legs: Left lower leg is soft, ttp anteriorly, posterior aspect is non-tender.  DP pulse is brisk, distal sensation intact.  CR is < 2 sec  Neurological: She is alert  and oriented to person, place, and time. She exhibits normal muscle tone. Coordination normal.  Skin: Skin is warm and dry.    ED Course  Procedures (including critical care time) Labs Review Labs Reviewed - No data to display  Imaging Review Dg Tibia/fibula Left  02/01/2014   CLINICAL DATA:  LEFT lower leg pain, fell in shower today, abrasion, bruising and "goose-egg" at proximal LEFT lower leg  EXAM: LEFT TIBIA AND FIBULA - 2 VIEW  COMPARISON:  LEFT knee radiographs 08/14/2012  FINDINGS: Knee and ankle joint alignments normal.  Osseous mineralization normal.  Pretibial soft tissue swelling at proximal LEFT lower leg.  No acute fracture, dislocation, or bone destruction.  IMPRESSION: No acute osseous abnormalities.   Electronically Signed   By: Lavonia Dana M.D.   On: 02/01/2014 14:42    EKG Interpretation None      MDM   Final diagnoses:  Contusion, lower leg, left, subsequent encounter    Patient with injury to left lower leg on 02/01/14 and seen here at onset.  Had negative imaging 5 days ago.  Mild STS of the anterior LLE with mild ecchymosis.  NV intact.  Compartments of the leg are soft.  Patient also evaluated by Dr. Lacinda Axon and clinical suspicion for compartment syndrome is low, no edema, erythema or tenderness posteriorly to suggest DVT. Care plan discussed to include different pain medication and antibiotic to cover for possible early developing cellulitis.     Patient is feeling better after IM pain medication and has crutches.  ACE wrap also given for comfort and pt advised not to wear for long periods of time.  She requests referral to another orthopedic provider and referral info given for [facility] and Dr. Aline Brochure.  Rx for percocet and pt agrees to continue minimal wt bearing , ice and elevation.  She appears stable for d/c and agrees to plan.  Filed Vitals:   02/06/14 2016  BP: 135/100  Pulse: 86  Temp:   Resp:      Kilyn Maragh L. Latunya Kissick, PA-C 02/08/14 1218  Atheena Spano L.  Vanessa Reston, PA-C 02/08/14 1219

## 2014-02-09 NOTE — ED Provider Notes (Signed)
Medical screening examination/treatment/procedure(s) were performed by non-physician practitioner and as supervising physician I was immediately available for consultation/collaboration.   EKG Interpretation None       Nat Christen, MD 02/09/14 2303

## 2014-03-15 ENCOUNTER — Encounter (HOSPITAL_COMMUNITY): Payer: Self-pay | Admitting: Emergency Medicine

## 2014-03-31 ENCOUNTER — Encounter (HOSPITAL_COMMUNITY): Payer: Self-pay | Admitting: *Deleted

## 2014-03-31 ENCOUNTER — Emergency Department (HOSPITAL_COMMUNITY)
Admission: EM | Admit: 2014-03-31 | Discharge: 2014-03-31 | Disposition: A | Discharge status: Home / Self Care | Payer: Medicaid Other | Attending: Emergency Medicine | Admitting: Emergency Medicine

## 2014-03-31 DIAGNOSIS — Y929 Unspecified place or not applicable: Secondary | ICD-10-CM | POA: Diagnosis not present

## 2014-03-31 DIAGNOSIS — Y9389 Activity, other specified: Secondary | ICD-10-CM | POA: Insufficient documentation

## 2014-03-31 DIAGNOSIS — T161XXA Foreign body in right ear, initial encounter: Secondary | ICD-10-CM | POA: Insufficient documentation

## 2014-03-31 DIAGNOSIS — Y998 Other external cause status: Secondary | ICD-10-CM | POA: Diagnosis not present

## 2014-03-31 DIAGNOSIS — X58XXXA Exposure to other specified factors, initial encounter: Secondary | ICD-10-CM | POA: Insufficient documentation

## 2014-03-31 DIAGNOSIS — Z720 Tobacco use: Secondary | ICD-10-CM | POA: Diagnosis not present

## 2014-03-31 DIAGNOSIS — J45909 Unspecified asthma, uncomplicated: Secondary | ICD-10-CM | POA: Insufficient documentation

## 2014-03-31 DIAGNOSIS — Z79899 Other long term (current) drug therapy: Secondary | ICD-10-CM | POA: Diagnosis not present

## 2014-03-31 DIAGNOSIS — Z792 Long term (current) use of antibiotics: Secondary | ICD-10-CM | POA: Insufficient documentation

## 2014-03-31 NOTE — Discharge Instructions (Signed)
Ear Foreign Body °An ear foreign body is an object that is stuck in the ear. It is common for young children to put objects into the ear canal. These may include pebbles, beads, beans, and any other small objects which will fit. In adults, objects such as cotton swabs may become lodged in the ear canal. In all ages, the most common foreign bodies are insects that enter the ear canal.  °SYMPTOMS  °Foreign bodies may cause pain, buzzing or roaring sounds, hearing loss, and ear drainage.  °HOME CARE INSTRUCTIONS  °· Keep all follow-up appointments with your caregiver as told. °· Keep small objects out of reach of young children. Tell them not to put anything in their ears. °SEEK IMMEDIATE MEDICAL CARE IF:  °· You have bleeding from the ear. °· You have increased pain or swelling of the ear. °· You have reduced hearing. °· You have discharge coming from the ear. °· You have a fever. °· You have a headache. °MAKE SURE YOU:  °· Understand these instructions. °· Will watch your condition. °· Will get help right away if you are not doing well or get worse. °Document Released: 04/27/2000 Document Revised: 07/23/2011 Document Reviewed: 12/17/2007 °ExitCare® Patient Information ©2015 [employer]. This information is not intended to replace advice given to you by your health care provider. Make sure you discuss any questions you have with your health care provider. ° °

## 2014-03-31 NOTE — ED Notes (Signed)
Pt was scratching the inside of her rt ear with a wet Q-tip, end of Q-tip came off in pt's ear.

## 2014-03-31 NOTE — ED Provider Notes (Signed)
CSN: [id number]     Arrival date & time 03/31/14  1555 History   First MD Initiated Contact with Patient 03/31/14 1739     Chief Complaint  Patient presents with  . Foreign Body in [place]     (Consider location/radiation/quality/duration/timing/severity/associated sxs/prior Treatment) Patient is a 28 y.o. female presenting with foreign body in ear. The history is provided by the patient. No language interpreter was used.  Foreign Body in Ear This is a new problem. The current episode started today. The problem occurs constantly. Nothing aggravates the symptoms. She has tried nothing for the symptoms.  Pt reports cotton tip of qtip came off in her ear.  Past Medical History  Diagnosis Date  . Asthma    Past Surgical History  Procedure Laterality Date  . Surgery for endometriosis     Family History  Problem Relation Age of Onset  . Cancer Mother   . Cancer Other    History  Substance Use Topics  . Smoking status: Current Every Day Smoker -- 0.05 packs/day for 14 years    Types: Cigarettes  . Smokeless tobacco: Never Used  . Alcohol Use: No   OB History    Gravida Para Term Preterm AB TAB SAB Ectopic Multiple Living   3 1 1  2 2    1      Review of Systems  HENT: Positive for ear pain.   All other systems reviewed and are negative.     Allergies  Flexeril and Ibuprofen  Home Medications   Prior to Admission medications   Medication Sig Start Date End Date Taking? Authorizing Provider  acetaminophen (TYLENOL) 500 MG tablet Take 1,000 mg by mouth every 6 (six) hours as needed for moderate pain or fever (For ankle pain.).     Historical Provider, MD  cephALEXin (KEFLEX) 500 MG capsule Take 1 capsule (500 mg total) by mouth 3 (three) times daily. 02/06/14   Tammy L. Triplett, PA-C  EPINEPHrine (EPI-PEN) 0.3 mg/0.3 mL SOAJ injection Inject 0.3 mg into the muscle once.    Historical Provider, MD  HYDROcodone-acetaminophen (NORCO/VICODIN) 5-325 MG per tablet Take 1 tablet  by mouth every 6 (six) hours as needed. 02/01/14   Maudry Diego, MD  Multiple Vitamin (MULTIVITAMIN WITH MINERALS) TABS tablet Take 1 tablet by mouth daily.     Historical Provider, MD  oxyCODONE-acetaminophen (PERCOCET) 7.5-325 MG per tablet Take 1 tablet by mouth every 6 (six) hours as needed for pain. 02/06/14   Tammy L. Triplett, PA-C   BP 131/91 mmHg  Pulse 76  Temp(Src) 99.1 F (37.3 C) (Oral)  Resp 18  Ht 5\' 4"  (1.626 m)  Wt 149 lb (67.586 kg)  BMI 25.56 kg/m2  SpO2 100%  LMP 03/17/2014 Physical Exam  Constitutional: She is oriented to person, place, and time. She appears well-developed and well-nourished.  HENT:  Head: Normocephalic.  Mouth/Throat: Oropharynx is clear and moist.  Cotton in right ear canal  Deep,   Eyes: Pupils are equal, round, and reactive to light.  Neurological: She is alert and oriented to person, place, and time.  Skin: Skin is warm.  Psychiatric: She has a normal mood and affect.  Nursing note and vitals reviewed.   ED Course  Procedures (including critical care time) Labs Review Labs Reviewed - No data to display  Imaging Review No results found.   EKG Interpretation None      MDM I attempted removal with alligator forcep.  Pt unable to tolerate.  Ear canal  irrigatted to remove end of qtip,  qtip broke into multiple pieces of cotton.  Tm intact post irriagtion and pt reports relief.   Final diagnoses:  Foreign body in right ear, initial encounter        Fransico Meadow, PA-C 04/01/14 0003  Orpah Greek, MD 04/05/14 1041

## 2014-11-02 LAB — HM PAP SMEAR: HM Pap smear: DETECTED

## 2014-11-18 ENCOUNTER — Encounter: Payer: Self-pay | Admitting: *Deleted

## 2014-11-19 ENCOUNTER — Encounter: Payer: Self-pay | Admitting: Obstetrics and Gynecology

## 2014-11-19 ENCOUNTER — Ambulatory Visit (INDEPENDENT_AMBULATORY_CARE_PROVIDER_SITE_OTHER): Payer: Medicaid Other | Admitting: Obstetrics and Gynecology

## 2014-11-19 VITALS — BP 110/66 | Ht 64.0 in | Wt 147.0 lb

## 2014-11-19 DIAGNOSIS — N871 Moderate cervical dysplasia: Secondary | ICD-10-CM

## 2014-11-19 NOTE — Progress Notes (Signed)
Patient ID: Valerie Huber, female   DOB: 03-23-1986, 29 y.o.   MRN: [id number] Pt here today for a colposcopy. Pt states that this is her first abnormal pap and that she would like to discuss the results of this with Dr. Glo Herring. Pt was unable to void today but started her cycle Wednesday.

## 2014-11-19 NOTE — Addendum Note (Signed)
Addended by: Farley Ly on: 11/19/2014 12:48 PM   Modules accepted: Orders

## 2014-11-19 NOTE — Progress Notes (Signed)
Patient ID: Valerie Huber, female   DOB: 01-16-1986, 29 y.o.   MRN: [id number] Referral from [facility] for abnormal pap noting suspected HSIL. Records are reviewed and confirmed.I agree that colposcopy indicated.  [name] 29 y.o. [id number] here for colposcopy for high-grade squamous intraepithelial neoplasia  (HGSIL-encompassing moderate and severe dysplasia) with high risk HPV pap smear on 6/21 at [place] was her first abnormal pap smear  . Pt is currently menstruating.   Discussed role for HPV in cervical dysplasia, need for surveillance.  Patient given informed consent, signed copy in the chart, time out was performed.  Placed in lithotomy position. Cervix viewed with speculum and colposcope after application of acetic acid.   Colposcopy adequate? Yes  Moderate abnormalities at posterior lip of the cervix; biopsies obtained at 3 o'clock and 6 o'clock.   ECC specimen obtained. All specimens were labelled and sent to pathology. 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 Will follow up pathology and manage accordingly.  Routine preventative health maintenance measures emphasized.   Will f/u by phone w/i a week   This chart was scribed for [name], MD by [name], ED Scribe. This patient was seen in room 2 and the patient's care was started at 12:14 PM.   I personally performed the services described in this documentation, which was SCRIBED in my presence. The recorded information has been reviewed and considered accurate. It has been edited as necessary during review. [name], MD

## 2014-11-22 ENCOUNTER — Other Ambulatory Visit: Payer: Self-pay | Admitting: Obstetrics and Gynecology

## 2014-11-23 ENCOUNTER — Telehealth: Payer: Self-pay | Admitting: Obstetrics and Gynecology

## 2014-11-23 ENCOUNTER — Emergency Department (HOSPITAL_COMMUNITY): Payer: Medicaid Other

## 2014-11-23 ENCOUNTER — Emergency Department (HOSPITAL_COMMUNITY)
Admission: EM | Admit: 2014-11-23 | Discharge: 2014-11-23 | Disposition: A | Discharge status: Home / Self Care | Payer: Medicaid Other | Attending: Emergency Medicine | Admitting: Emergency Medicine

## 2014-11-23 ENCOUNTER — Encounter (HOSPITAL_COMMUNITY): Payer: Self-pay | Admitting: Emergency Medicine

## 2014-11-23 DIAGNOSIS — Z720 Tobacco use: Secondary | ICD-10-CM | POA: Diagnosis not present

## 2014-11-23 DIAGNOSIS — Z87448 Personal history of other diseases of urinary system: Secondary | ICD-10-CM | POA: Diagnosis not present

## 2014-11-23 DIAGNOSIS — Z3202 Encounter for pregnancy test, result negative: Secondary | ICD-10-CM | POA: Insufficient documentation

## 2014-11-23 DIAGNOSIS — Z79899 Other long term (current) drug therapy: Secondary | ICD-10-CM | POA: Insufficient documentation

## 2014-11-23 DIAGNOSIS — J45909 Unspecified asthma, uncomplicated: Secondary | ICD-10-CM | POA: Diagnosis not present

## 2014-11-23 DIAGNOSIS — K920 Hematemesis: Secondary | ICD-10-CM | POA: Diagnosis not present

## 2014-11-23 DIAGNOSIS — R1110 Vomiting, unspecified: Secondary | ICD-10-CM

## 2014-11-23 LAB — COMPREHENSIVE METABOLIC PANEL
ALT: 11 U/L — ABNORMAL LOW (ref 14–54)
ANION GAP: 8 (ref 5–15)
AST: 15 U/L (ref 15–41)
Albumin: 4.3 g/dL (ref 3.5–5.0)
Alkaline Phosphatase: 39 U/L (ref 38–126)
BUN: 7 mg/dL (ref 6–20)
CALCIUM: 8.7 mg/dL — AB (ref 8.9–10.3)
CO2: 24 mmol/L (ref 22–32)
Chloride: 104 mmol/L (ref 101–111)
Creatinine, Ser: 0.67 mg/dL (ref 0.44–1.00)
GFR calc non Af Amer: >60 mL/min (ref >60)
Glucose, Bld: 100 mg/dL — ABNORMAL HIGH (ref 65–99)
Potassium: 3.4 mmol/L — ABNORMAL LOW (ref 3.5–5.1)
SODIUM: 136 mmol/L (ref 135–145)
TOTAL PROTEIN: 7.3 g/dL (ref 6.5–8.1)
Total Bilirubin: 0.6 mg/dL (ref 0.3–1.2)

## 2014-11-23 LAB — CBC WITH DIFFERENTIAL/PLATELET
Basophils Absolute: 0 10*3/uL (ref 0.0–0.1)
Basophils Relative: 0 % (ref 0–1)
Eosinophils Absolute: 0 10*3/uL (ref 0.0–0.7)
Eosinophils Relative: 1 % (ref 0–5)
HEMATOCRIT: 34.6 % — AB (ref 36.0–46.0)
Hemoglobin: 11.6 g/dL — ABNORMAL LOW (ref 12.0–15.0)
Lymphocytes Relative: 46 % (ref 12–46)
Lymphs Abs: 2.2 10*3/uL (ref 0.7–4.0)
MCH: 32.1 pg (ref 26.0–34.0)
MCHC: 33.5 g/dL (ref 30.0–36.0)
MCV: 95.8 fL (ref 78.0–100.0)
MONO ABS: 0.4 10*3/uL (ref 0.1–1.0)
Monocytes Relative: 8 % (ref 3–12)
Neutro Abs: 2.1 10*3/uL (ref 1.7–7.7)
Neutrophils Relative %: 45 % (ref 43–77)
PLATELETS: 220 10*3/uL (ref 150–400)
RBC: 3.61 MIL/uL — ABNORMAL LOW (ref 3.87–5.11)
RDW: 12.2 % (ref 11.5–15.5)
WBC: 4.7 10*3/uL (ref 4.0–10.5)

## 2014-11-23 LAB — URINE MICROSCOPIC-ADD ON

## 2014-11-23 LAB — URINALYSIS, ROUTINE W REFLEX MICROSCOPIC
BILIRUBIN URINE: NEGATIVE
Glucose, UA: NEGATIVE mg/dL
KETONES UR: NEGATIVE mg/dL
LEUKOCYTES UA: NEGATIVE
NITRITE: NEGATIVE
PH: 6.5 (ref 5.0–8.0)
PROTEIN: NEGATIVE mg/dL
SPECIFIC GRAVITY, URINE: 1.01 (ref 1.005–1.030)
Urobilinogen, UA: 0.2 mg/dL (ref 0.0–1.0)

## 2014-11-23 LAB — PREGNANCY, URINE: PREG TEST UR: NEGATIVE

## 2014-11-23 MED ORDER — PANTOPRAZOLE SODIUM 40 MG IV SOLR
40.0000 mg | Freq: Once | INTRAVENOUS | Status: AC
  Administered 2014-11-23: 40 mg via INTRAVENOUS
  Filled 2014-11-23: qty 40

## 2014-11-23 MED ORDER — ONDANSETRON HCL 4 MG/2ML IJ SOLN
4.0000 mg | Freq: Once | INTRAMUSCULAR | Status: AC
  Administered 2014-11-23: 4 mg via INTRAVENOUS

## 2014-11-23 MED ORDER — PANTOPRAZOLE SODIUM 40 MG PO TBEC: 40.0000 mg | DELAYED_RELEASE_TABLET | Freq: Every day | ORAL | Status: DC

## 2014-11-23 MED ORDER — SODIUM CHLORIDE 0.9 % IV BOLUS (SEPSIS)
1000.0000 mL | Freq: Once | INTRAVENOUS | Status: AC
Start: 2014-11-23 — End: 2014-11-23
  Administered 2014-11-23: 1000 mL via INTRAVENOUS

## 2014-11-23 MED ORDER — ONDANSETRON HCL 4 MG/2ML IJ SOLN
INTRAMUSCULAR | Status: AC
  Administered 2014-11-23: 4 mg via INTRAVENOUS
  Filled 2014-11-23: qty 2

## 2014-11-23 MED ORDER — ONDANSETRON 4 MG PO TBDP: ORAL_TABLET | ORAL | Status: DC

## 2014-11-23 NOTE — ED Provider Notes (Signed)
CSN: [id number]     Arrival date & time 11/23/14  0919 History   First MD Initiated Contact with Patient 11/23/14 [phone]     Chief Complaint  Patient presents with  . Hematemesis     (Consider location/radiation/quality/duration/timing/severity/associated sxs/prior Treatment) Patient is a 29 y.o. female presenting with vomiting. The history is provided by the patient (pt states she has been vomiting blood since last night.  she has vomited about 5 times.  blood is bright red).  Emesis Severity:  Mild Timing:  Intermittent Quality:  Bright red blood Able to tolerate:  Liquids Progression:  Unchanged Chronicity:  New Recent urination:  Normal Associated symptoms: no abdominal pain, no diarrhea and no headaches     Past Medical History  Diagnosis Date  . Asthma   . Vaginal Pap smear, abnormal   . Endometriosis   . Seizures    Past Surgical History  Procedure Laterality Date  . Surgery for endometriosis     Family History  Problem Relation Age of Onset  . Cancer Mother     kidney   . Asthma Mother   . Hypertension Mother    History  Substance Use Topics  . Smoking status: Current Every Day Smoker -- 0.50 packs/day for 14 years    Types: Cigarettes  . Smokeless tobacco: Never Used  . Alcohol Use: No     Comment: occ.   OB History    Gravida Para Term Preterm AB TAB SAB Ectopic Multiple Living   3 1 1  2 2    1      Review of Systems  Constitutional: Negative for appetite change and fatigue.  HENT: Negative for congestion, ear discharge and sinus pressure.   Eyes: Negative for discharge.  Respiratory: Negative for cough.   Cardiovascular: Negative for chest pain.  Gastrointestinal: Positive for vomiting. Negative for abdominal pain and diarrhea.  Genitourinary: Negative for frequency and hematuria.  Musculoskeletal: Negative for back pain.  Skin: Negative for rash.  Neurological: Negative for seizures and headaches.  Psychiatric/Behavioral: Negative for  hallucinations.      Allergies  Flexeril and Ibuprofen  Home Medications   Prior to Admission medications   Medication Sig Start Date End Date Taking? Authorizing Provider  acetaminophen (TYLENOL) 500 MG tablet Take 1,000 mg by mouth every 6 (six) hours as needed for moderate pain or fever (For ankle pain.).    Yes Historical Provider, MD  EPINEPHrine (EPI-PEN) 0.3 mg/0.3 mL SOAJ injection Inject 0.3 mg into the muscle once.   Yes Historical Provider, MD  Multiple Vitamin (MULTIVITAMIN WITH MINERALS) TABS tablet Take 1 tablet by mouth daily.    Yes Historical Provider, MD  ondansetron (ZOFRAN ODT) 4 MG disintegrating tablet 4mg  ODT q4 hours prn nausea/vomit 11/23/14   Milton Ferguson, MD  pantoprazole (PROTONIX) 40 MG tablet Take 1 tablet (40 mg total) by mouth daily. 11/23/14   Milton Ferguson, MD   BP 104/75 mmHg  Pulse 56  Temp(Src) 98.6 F (37 C) (Oral)  Resp 18  Ht 5\' 4"  (1.626 m)  Wt 147 lb (66.679 kg)  BMI 25.22 kg/m2  SpO2 100%  LMP 11/17/2014 Physical Exam  Constitutional: She is oriented to person, place, and time. She appears well-developed.  HENT:  Head: Normocephalic.  Eyes: Conjunctivae and EOM are normal. No scleral icterus.  Neck: Neck supple. No thyromegaly present.  Cardiovascular: Normal rate and regular rhythm.  Exam reveals no gallop and no friction rub.   No murmur heard. Pulmonary/Chest: No stridor. She  has no wheezes. She has no rales. She exhibits no tenderness.  Abdominal: She exhibits no distension. There is no tenderness. There is no rebound.  Musculoskeletal: Normal range of motion. She exhibits no edema.  Lymphadenopathy:    She has no cervical adenopathy.  Neurological: She is oriented to person, place, and time. She exhibits normal muscle tone. Coordination normal.  Skin: No rash noted. No erythema.  Psychiatric: She has a normal mood and affect. Her behavior is normal.    ED Course  Procedures (including critical care time) Labs Review Labs  Reviewed  CBC WITH DIFFERENTIAL/PLATELET - Abnormal; Notable for the following:    RBC 3.61 (*)    Hemoglobin 11.6 (*)    HCT 34.6 (*)    All other components within normal limits  COMPREHENSIVE METABOLIC PANEL - Abnormal; Notable for the following:    Potassium 3.4 (*)    Glucose, Bld 100 (*)    Calcium 8.7 (*)    ALT 11 (*)    All other components within normal limits  URINALYSIS, ROUTINE W REFLEX MICROSCOPIC (NOT AT [facility]) - Abnormal; Notable for the following:    Hgb urine dipstick MODERATE (*)    All other components within normal limits  URINE MICROSCOPIC-ADD ON - Abnormal; Notable for the following:    Squamous Epithelial / LPF MANY (*)    Bacteria, UA FEW (*)    All other components within normal limits  PREGNANCY, URINE    Imaging Review Dg Abd Acute W/chest  11/23/2014   CLINICAL DATA:  Vomiting and diarrhea.  Right lower abdominal pain.  EXAM: DG ABDOMEN ACUTE W/ 1V CHEST  COMPARISON:  Chest x-ray on 02/09/2013  FINDINGS: The heart size is normal. There is no evidence of pulmonary edema, consolidation, pneumothorax, nodule or pleural fluid.  Abdominal films show no evidence of bowel obstruction, ileus or free air. No abnormal calcifications are identified. Soft tissues are unremarkable.  IMPRESSION: No acute findings in the chest or abdomen.   Electronically Signed   By: Aletta Edouard M.D.   On: 11/23/2014 13:10     EKG Interpretation None      MDM   Final diagnoses:  Vomiting  Hematemesis with nausea    Hematemesis,  Possibly ulcer, gastritis, unlikely esophageal varices,   Will rx with protonix and zofran with gi follow up     Milton Ferguson, MD 11/23/14 1359

## 2014-11-23 NOTE — ED Notes (Signed)
Pt states that she is feeling better at this time.

## 2014-11-23 NOTE — ED Notes (Signed)
Pt states that she had a colposcopy done on 11/19/14.  States that she was having some bleeding after removing tampon yesterday.  States bleeding has stopped today but started vomiting blood yesterday and today has vomited blood twice.

## 2014-11-23 NOTE — Discharge Instructions (Signed)
Follow up with dr. Oneida Alar this week.  Return if problems

## 2014-11-23 NOTE — ED Notes (Signed)
Pt denies any needs.

## 2014-11-23 NOTE — Telephone Encounter (Signed)
I spoke with the pt and the pt went to ED and is currently being treated at this time. Pt may call us back to update [place]. Pt has been vomiting and vomiting blood a few times.

## 2014-11-23 NOTE — ED Notes (Signed)
Pt actively vomiting.  No blood noted.  Medicated with Zofran.

## 2014-11-24 ENCOUNTER — Telehealth: Payer: Self-pay | Admitting: Gastroenterology

## 2014-11-24 NOTE — Telephone Encounter (Signed)
CONTACT PT FOR NPV WITH ANY PROVIDER FOR HEMATEMESIS/NAUSEA WITHIN NEXT 2 WEEKS.

## 2014-11-25 ENCOUNTER — Encounter: Payer: Self-pay | Admitting: Gastroenterology

## 2014-11-25 LAB — URINE CULTURE

## 2014-11-25 NOTE — Telephone Encounter (Signed)
OV made for 7/27 at 8 with AS, appt letter mailed and I am trying to get medical records from [facility]

## 2014-11-26 NOTE — Progress Notes (Signed)
ED Antimicrobial Stewardship Positive Culture Follow Up   Valerie Huber is an 29 y.o. female who presented to [facility] on 11/23/2014 with a chief complaint of  Chief Complaint  Patient presents with  . Hematemesis    Recent Results (from the past 720 hour(s))  Urine culture     Status: None   Collection Time: 11/23/14 10:17 AM  Result Value Ref Range Status   Specimen Description URINE, CLEAN CATCH  Final   Special Requests NONE  Final   Culture   Final    80,000 COLONIES/ml GROUP B STREP(S.AGALACTIAE)ISOLATED TESTING AGAINST S. AGALACTIAE NOT ROUTINELY PERFORMED DUE TO PREDICTABILITY OF AMP/PEN/VAN SUSCEPTIBILITY. Performed at [facility]    Report Status 11/25/2014 FINAL  Final    [x]  No treatment needed  51 YOF s/p a recent colonoscopy on 7/8 who presented with hematemesis and received work-up related to this. The patient was s/p recent menstrual cycle with tampon use - no specific urinary complaints, afebrile, WBC wnl, not pregnant, likely colonizer and not pathogenic.   New antibiotic prescription: No treatment needed >> asymptomatic bacteriuria.   ED Provider: Glendell Docker, NP   Lawson Radar 11/26/2014, 8:21 AM Infectious Diseases Pharmacist Phone# [phone]

## 2014-11-27 ENCOUNTER — Telehealth: Payer: Self-pay | Admitting: *Deleted

## 2014-11-27 NOTE — ED Notes (Signed)
(+)  urine culture, would not treat, colonized, no urinary  Sx, per Alycia Rossetti, Pharm

## 2014-11-29 ENCOUNTER — Telehealth: Payer: Self-pay | Admitting: Obstetrics and Gynecology

## 2014-11-29 NOTE — Telephone Encounter (Signed)
Pt came into the office today stating she has not heard back from our office in regards to her cervical biopsy and ECC results from 11/22/2014. 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 Glo Herring. LEEP,  procedure discussed in detail, all questions answered. Pt scheduled for LEEP on 12/06/2014 with Dr. Glo Herring.

## 2014-12-06 ENCOUNTER — Ambulatory Visit (INDEPENDENT_AMBULATORY_CARE_PROVIDER_SITE_OTHER): Payer: Medicaid Other | Admitting: Obstetrics and Gynecology

## 2014-12-06 ENCOUNTER — Encounter: Payer: Self-pay | Admitting: Obstetrics and Gynecology

## 2014-12-06 ENCOUNTER — Other Ambulatory Visit: Payer: Self-pay | Admitting: Obstetrics and Gynecology

## 2014-12-06 VITALS — BP 100/70 | Ht 64.0 in | Wt 143.5 lb

## 2014-12-06 DIAGNOSIS — R87618 Other abnormal cytological findings on specimens from cervix uteri: Secondary | ICD-10-CM

## 2014-12-06 DIAGNOSIS — Z3200 Encounter for pregnancy test, result unknown: Secondary | ICD-10-CM | POA: Diagnosis not present

## 2014-12-06 DIAGNOSIS — N871 Moderate cervical dysplasia: Secondary | ICD-10-CM

## 2014-12-06 LAB — POCT URINE PREGNANCY: Preg Test, Ur: NEGATIVE

## 2014-12-06 NOTE — Addendum Note (Signed)
Addended by: Farley Ly on: 12/06/2014 03:09 PM   Modules accepted: Orders

## 2014-12-06 NOTE — Progress Notes (Signed)
Patient ID: Valerie Huber, female   DOB: 02-Apr-1986, 29 y.o.   MRN: [id number] Pt here today for a LEEP. Procedure explained and consent signed.

## 2014-12-06 NOTE — Progress Notes (Signed)
Patient ID: Valerie Huber, female   DOB: [birth year], 29 y.o.   MRN: [id number]    [facility] Visit  Patient name: Valerie Huber MRN [id number]  Date of birth: Sep 25, 1985  CC & HPI:  Zanayah Shadowens is a 29 y.o. female presenting today for LEEP. 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 She had a negative UPT.  ROS:  A complete 10 system review of systems was obtained and all systems are negative except as noted in the HPI and PMH.   Pertinent History Reviewed:   Reviewed: Significant for abnormal pap, endometriosis  Medical         Past Medical History  Diagnosis Date  . Asthma   . Vaginal Pap smear, abnormal   . Endometriosis   . Seizures                               Surgical Hx:    Past Surgical History  Procedure Laterality Date  . Surgery for endometriosis     Medications: Reviewed & Updated - see associated section                       Current outpatient prescriptions:  .  acetaminophen (TYLENOL) 500 MG tablet, Take 1,000 mg by mouth every 6 (six) hours as needed for moderate pain or fever (For ankle pain.). , Disp: , Rfl:  .  pantoprazole (PROTONIX) 40 MG tablet, Take 1 tablet (40 mg total) by mouth daily., Disp: 30 tablet, Rfl: 0 .  EPINEPHrine (EPI-PEN) 0.3 mg/0.3 mL SOAJ injection, Inject 0.3 mg into the muscle once., Disp: , Rfl:  .  Multiple Vitamin (MULTIVITAMIN WITH MINERALS) TABS tablet, Take 1 tablet by mouth daily. , Disp: , Rfl:  .  ondansetron (ZOFRAN ODT) 4 MG disintegrating tablet, 4mg  ODT q4 hours prn nausea/vomit (Patient not taking: Reported on 12/06/2014), Disp: 12 tablet, Rfl: 0   Social History: Reviewed -  reports that she has been smoking Cigarettes.  She has a 7 pack-year smoking history. She has never used smokeless tobacco.  Objective Findings:  Vitals: Blood pressure 100/70, height 5\' 4"  (1.626 m), weight 143 lb 8 oz (65.091 kg), last menstrual period  11/17/2014.  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  A coated speculum was inserted into the vagina and colposcopic examination was performed with 4% acidic acid with findings noted above.  The cervix was then painted with Lugol's solution to delineate the margins of the lesion and transformation zone.  Approximately 10cc's of 2% xylocaine with epinephrine was infiltrated deep near the outer margin of the transformation zone circumferentially at 12, 3, 6, and 9 o'clock positions.  The [facility] Electrosurgical Generator was then turned on after the patient was grounded with pad electrode on her thigh and jewelry removed.  The  settings on the generator were Blend 1 current 40 watts cut and 40 watts on the coagulation mode.  A size 69mm loop electrode was utilized to exercise the atypical transformation zone.  The tip of the electrode was placed 3 mm from the edge of the lesion at 3, 6, 9 and 12 o'clock position.  The electrode was moved slowly over the lesion was within the loop limits.  A vaginal wall retractor was not used.  The loop was then repositioned and the finger switch on the hand held piece was activated ( or footpedal depressed).  A slight pressur5e on the shaft was applied and the loop was extended into the tissue up to its crossbar to a depth of 5 mm, then  with steady, slow motion across and underneath the endocervical button was not excised.  The loop electrode was replaced with ball electrode set at 50 watts and the base of the crater was fulgurated circumferentially.  Monsell's paint was then applied for additional hemostasis. The specime was opened at 9 for orientation, and placed in formation fixative for pathology evaluation.  Patient tolerated the procedure well with minimal blood loss and without any complications.  After the procedure patient left office with stable vital signs and instructions sheet.   I personally performed the services described in this documentation, which was SCRIBED in my presence. The recorded information has been reviewed and considered accurate. It has been edited as necessary during review. [name], MD   LEEP   Specimen open at 9 o'clock   Assessment & Plan:   A:  1. LEEP performed  P:  1. Advised pt to refrain from sexual activity for approximately 2 weeks    This chart was scribed for [name], MD by [name], Medical Scribe. This patient was seen in room 2 and the patient's care was started at 2:04 PM.   I personally performed the services described in this documentation, which was SCRIBED in my presence. The recorded information has been reviewed and considered accurate. It has been edited as necessary during review. [name], MD

## 2014-12-08 ENCOUNTER — Ambulatory Visit (INDEPENDENT_AMBULATORY_CARE_PROVIDER_SITE_OTHER): Payer: Medicaid Other | Admitting: Gastroenterology

## 2014-12-08 ENCOUNTER — Other Ambulatory Visit: Payer: Self-pay

## 2014-12-08 ENCOUNTER — Encounter: Payer: Self-pay | Admitting: Gastroenterology

## 2014-12-08 VITALS — BP 114/78 | HR 69 | Temp 97.6°F | Ht 64.0 in | Wt 145.2 lb

## 2014-12-08 DIAGNOSIS — K920 Hematemesis: Secondary | ICD-10-CM | POA: Diagnosis not present

## 2014-12-08 DIAGNOSIS — R110 Nausea: Secondary | ICD-10-CM | POA: Diagnosis not present

## 2014-12-08 NOTE — Assessment & Plan Note (Signed)
29 year old with acute onset of hematemesis several weeks ago, now resolved. No prior GERD symptoms, use of NSAIDs/aspirin powders. Query MW tear, esophagitis, gastritis/PUD. Would recommend EGD for diagnostic purposes.   Proceed with upper endoscopy in the near future with Dr. Oneida Alar. The risks, benefits, and alternatives have been discussed in detail with patient. They have stated understanding and desire to proceed.  Continue Protonix and Zofran as instructed Continue to use tylenol products only

## 2014-12-08 NOTE — Progress Notes (Signed)
Primary Care Physician:  [place] Primary Gastroenterologist:  Dr. Oneida Alar   Chief Complaint  Patient presents with  . Nausea  . Abdominal Pain    HPI:   Valerie Huber is a 29 y.o. female presenting today at the request of the ED secondary to hematemesis.   Acute symptom onset, starting on July 11 and lasting through the 12. Had repetitive vomiting, 5-6 times. Looked like color of tomato sauce. The next morning still with hematemesis. No fever. Hematemesis with first episode of vomiting, not after repetitive vomiting. No further hematemesis after ED visit. Prescribed nausea medication, Protonix. Some mild nausea but no vomiting. No history of GERD. No NSAIDs or aspirin powders. Has been taking tylenol a lot due to GYN issues. No melena. No appetite. Will go a few days without food then eat again. Not normal for patient.   Recently had LEEP procedure on July 25. Menstrual cycle prolonged, sometimes a month or longer. Associated with lower abdominal.   Past Medical History  Diagnosis Date  . Asthma   . Vaginal Pap smear, abnormal   . Endometriosis   . Seizures     Past Surgical History  Procedure Laterality Date  . Surgery for endometriosis  2005  . Leep      Current Outpatient Prescriptions  Medication Sig Dispense Refill  . EPINEPHrine (EPI-PEN) 0.3 mg/0.3 mL SOAJ injection Inject 0.3 mg into the muscle once.    . ondansetron (ZOFRAN ODT) 4 MG disintegrating tablet 4mg  ODT q4 hours prn nausea/vomit 12 tablet 0  . pantoprazole (PROTONIX) 40 MG tablet Take 1 tablet (40 mg total) by mouth daily. 30 tablet 0   No current facility-administered medications for this visit.    Allergies as of 12/08/2014 - Review Complete 12/08/2014  Allergen Reaction Noted  . Flexeril [cyclobenzaprine] Hives 12/25/2012  . Ibuprofen Other (See Comments) 01/01/2014    Family History  Problem Relation Age of Onset  . Cancer Mother     kidney   . Asthma  Mother   . Hypertension Mother   . Colon cancer Neg Hx     History   Social History  . Marital Status: Single    Spouse Name: N/A  . Number of Children: N/A  . Years of Education: N/A   Occupational History  . Housecleaning    Social History Main Topics  . Smoking status: Current Every Day Smoker -- 0.50 packs/day for 14 years    Types: Cigarettes  . Smokeless tobacco: Never Used     Comment: QUIT SMOKING ON JULY 11th. STATES WOULD ONLY HAVE A FEW CIGARETTES A WEEK PRIOR TO THIS.   [name] Alcohol Use: No     Comment: occ.  . Drug Use: No  . Sexual Activity: Yes    Birth Control/ Protection: None   Other Topics Concern  . Not on file   Social History Narrative    Review of Systems: Negative unless mentioned in HPI.  Physical Exam: BP 114/78 mmHg  Pulse 69  Temp(Src) 97.6 F (36.4 C) (Oral)  Ht 5\' 4"  (1.626 m)  Wt 145 lb 3.2 oz (65.862 kg)  BMI 24.91 kg/m2  LMP 11/17/2014 General:   Alert and oriented. Pleasant and cooperative. Well-nourished and well-developed.  Head:  Normocephalic and atraumatic. Eyes:  Without icterus, sclera clear and conjunctiva pink.  Ears:  Normal auditory acuity. Nose:  No deformity, discharge,  or lesions. Lungs:  Clear to auscultation bilaterally. No wheezes, rales,  or rhonchi. No distress.  Heart:  S1, S2 present without murmurs appreciated.  Abdomen:  +BS, soft, mild discomfort to left of umbilicus and non-distended. No HSM noted. No guarding or rebound. No masses appreciated.  Rectal:  Deferred  Msk:  Symmetrical without gross deformities. Normal posture. Extremities:  Without edema. Neurologic:  Alert and  oriented x4;  grossly normal neurologically. Psych:  Alert and cooperative. Normal mood and affect.  Lab Results  Component Value Date   WBC 4.7 11/23/2014   HGB 11.6* 11/23/2014   HCT 34.6* 11/23/2014   MCV 95.8 11/23/2014   PLT 220 11/23/2014   Lab Results  Component Value Date   ALT 11* 11/23/2014   AST 15 11/23/2014     ALKPHOS 39 11/23/2014   BILITOT 0.6 11/23/2014

## 2014-12-08 NOTE — Patient Instructions (Signed)
We have scheduled you for an upper endoscopy with Dr. Oneida Alar to further evaluate.   Continue taking Protonix once each morning, 30 minutes before breakfast. Continue Zofran as needed.

## 2014-12-10 ENCOUNTER — Ambulatory Visit (HOSPITAL_COMMUNITY)
Admission: RE | Admit: 2014-12-10 | Discharge: 2014-12-10 | Disposition: A | Discharge status: Home / Self Care | Payer: Medicaid Other | Source: Ambulatory Visit | Attending: Gastroenterology | Admitting: Gastroenterology

## 2014-12-10 ENCOUNTER — Encounter (HOSPITAL_COMMUNITY)
Admission: RE | Disposition: A | Discharge status: Home / Self Care | Payer: Self-pay | Source: Ambulatory Visit | Attending: Gastroenterology

## 2014-12-10 ENCOUNTER — Encounter (HOSPITAL_COMMUNITY): Payer: Self-pay | Admitting: *Deleted

## 2014-12-10 DIAGNOSIS — K2950 Unspecified chronic gastritis without bleeding: Secondary | ICD-10-CM | POA: Insufficient documentation

## 2014-12-10 DIAGNOSIS — K269 Duodenal ulcer, unspecified as acute or chronic, without hemorrhage or perforation: Secondary | ICD-10-CM | POA: Diagnosis not present

## 2014-12-10 DIAGNOSIS — K920 Hematemesis: Secondary | ICD-10-CM

## 2014-12-10 DIAGNOSIS — R110 Nausea: Secondary | ICD-10-CM

## 2014-12-10 DIAGNOSIS — F17210 Nicotine dependence, cigarettes, uncomplicated: Secondary | ICD-10-CM | POA: Insufficient documentation

## 2014-12-10 DIAGNOSIS — K2980 Duodenitis without bleeding: Secondary | ICD-10-CM | POA: Insufficient documentation

## 2014-12-10 DIAGNOSIS — K2990 Gastroduodenitis, unspecified, without bleeding: Secondary | ICD-10-CM | POA: Diagnosis not present

## 2014-12-10 DIAGNOSIS — B9681 Helicobacter pylori [H. pylori] as the cause of diseases classified elsewhere: Secondary | ICD-10-CM | POA: Insufficient documentation

## 2014-12-10 DIAGNOSIS — K2970 Gastritis, unspecified, without bleeding: Secondary | ICD-10-CM | POA: Diagnosis not present

## 2014-12-10 HISTORY — PX: ESOPHAGOGASTRODUODENOSCOPY: SHX5428

## 2014-12-10 SURGERY — EGD (ESOPHAGOGASTRODUODENOSCOPY)
Anesthesia: Moderate Sedation

## 2014-12-10 MED ORDER — SODIUM CHLORIDE 0.9 % IJ SOLN
INTRAMUSCULAR | Status: AC
Start: 2014-12-10 — End: 2014-12-10
  Filled 2014-12-10: qty 3

## 2014-12-10 MED ORDER — PANTOPRAZOLE SODIUM 40 MG PO TBEC: 40.0000 mg | DELAYED_RELEASE_TABLET | Freq: Two times a day (BID) | ORAL | Status: DC

## 2014-12-10 MED ORDER — MIDAZOLAM HCL 5 MG/5ML IJ SOLN
INTRAMUSCULAR | Status: DC | PRN
  Administered 2014-12-10: 2 mg via INTRAVENOUS
  Administered 2014-12-10: 1 mg via INTRAVENOUS
  Administered 2014-12-10 (×2): 2 mg via INTRAVENOUS
  Administered 2014-12-10: 1 mg via INTRAVENOUS

## 2014-12-10 MED ORDER — LIDOCAINE VISCOUS 2 % MT SOLN
OROMUCOSAL | Status: AC
  Filled 2014-12-10: qty 15

## 2014-12-10 MED ORDER — LIDOCAINE VISCOUS 2 % MT SOLN
OROMUCOSAL | Status: DC | PRN
  Administered 2014-12-10: 5 mL via OROMUCOSAL

## 2014-12-10 MED ORDER — SODIUM CHLORIDE 0.9 % IV SOLN: INTRAVENOUS | Status: DC

## 2014-12-10 MED ORDER — MEPERIDINE HCL 100 MG/ML IJ SOLN
INTRAMUSCULAR | Status: AC
  Filled 2014-12-10: qty 2

## 2014-12-10 MED ORDER — MIDAZOLAM HCL 5 MG/5ML IJ SOLN
INTRAMUSCULAR | Status: AC
  Filled 2014-12-10: qty 10

## 2014-12-10 MED ORDER — MEPERIDINE HCL 100 MG/ML IJ SOLN
INTRAMUSCULAR | Status: DC | PRN
  Administered 2014-12-10: 50 mg via INTRAVENOUS
  Administered 2014-12-10 (×4): 25 mg via INTRAVENOUS

## 2014-12-10 MED ORDER — PROMETHAZINE HCL 25 MG/ML IJ SOLN
INTRAMUSCULAR | Status: AC
  Filled 2014-12-10: qty 1

## 2014-12-10 MED ORDER — PROMETHAZINE HCL 25 MG/ML IJ SOLN: 12.5000 mg | Freq: Once | INTRAMUSCULAR | Status: DC

## 2014-12-10 MED ORDER — STERILE WATER FOR IRRIGATION IR SOLN
Status: DC | PRN
  Administered 2014-12-10: 09:00:00

## 2014-12-10 MED ORDER — PROMETHAZINE HCL 25 MG/ML IJ SOLN
INTRAMUSCULAR | Status: DC | PRN
  Administered 2014-12-10: 12.5 mg via INTRAVENOUS

## 2014-12-10 NOTE — Op Note (Signed)
[facility] [address] [place], [address]   ENDOSCOPY PROCEDURE REPORT  PATIENT: Valerie Huber, Valerie Huber  MR#: [id number] BIRTHDATE: 1985-07-01 , 29  yrs. old GENDER: female  ENDOSCOPIST: Danie Binder, MD REFERRED KD:[facility]  PROCEDURE DATE: 01-05-2015 PROCEDURE:   EGD w/ biopsy  INDICATIONS:hematemesis. MEDICATIONS: Promethazine (Phenergan) 12.5 mg IV, Demerol 150 mg IV, and Versed 8 mg IV TOPICAL ANESTHETIC:   Viscous Xylocaine ASA CLASS:  DESCRIPTION OF PROCEDURE:     Physical exam was performed.  Informed consent was obtained from the patient after explaining the benefits, risks, and alternatives to the procedure.  The patient was connected to the monitor and placed in the left lateral position.  Continuous oxygen was provided by nasal cannula and IV medicine administered through an indwelling cannula.  After administration of sedation, the patients esophagus was intubated and the EG-[id number] ([id number])  endoscope was advanced under direct visualization to the second portion of the duodenum.  The scope was removed slowly by carefully examining the color, texture, anatomy, and integrity of the mucosa on the way out.  The patient was recovered in endoscopy and discharged home in satisfactory condition.  Estimated blood loss is zero unless otherwise noted in this procedure report.    ESOPHAGUS: The mucosa of the esophagus appeared normal.   STOMACH: Moderate erosive gastritis (inflammation) was found in the gastric antrum.  Multiple biopsies were performed using cold forceps. DUODENUM: A small non-bleeding, clean-based and punctate ulcer was found in the duodenal bulb.   Moderate duodenal inflammation was found in the duodenal bulb.   The duodenal mucosa showed no abnormalities in the 2nd part of the duodenum. COMPLICATIONS: There were no immediate complications.  ENDOSCOPIC IMPRESSION: 1.   HEMATEMESIS DUE TO MODERATE Erosive gastritis,  DUODENITIS, AND A DUODENAL ULCER  RECOMMENDATIONS: INCREASE PRotonix TO twice daily. STRICTLY AVOID ASPIRIN, BC/GOODY POWDERS, IBUPROFEN/MOTRIN, OR NAPROXEN/ALEVE. AWAIT BIOPSY RESULTS . FOLLOW UP Jan 30th @ 9:30am  REPEAT EXAM:   _______________________________ Lorrin MaisDanie Binder, MD 01-05-15 3:31 PM   CPT CODES: ICD CODES:  The ICD and CPT codes recommended by this software are interpretations from the data that the clinical staff has captured with the software.  The verification of the translation of this report to the ICD and CPT codes and modifiers is the sole responsibility of the health care institution and practicing physician where this report was generated.  [facility] will not be held responsible for the validity of the ICD and CPT codes included on this report.  AMA assumes no liability for data contained or not contained herein. CPT is a Designer, television/film set of the [employer].

## 2014-12-10 NOTE — H&P (Signed)
  Primary Care Physician:  [place] Primary Gastroenterologist:  Dr. Oneida Alar  Pre-Procedure History & Physical: HPI:  Valerie Huber is a 29 y.o. female here for HEMATEMESIS.  Past Medical History  Diagnosis Date  . Asthma   . Vaginal Pap smear, abnormal   . Endometriosis   . Seizures     Past Surgical History  Procedure Laterality Date  . Surgery for endometriosis  2005  . Leep      Prior to Admission medications   Medication Sig Start Date End Date Taking? Authorizing Provider  acetaminophen (TYLENOL) 500 MG tablet Take 1,000 mg by mouth every 6 (six) hours as needed for moderate pain.   Yes Historical Provider, MD  ondansetron (ZOFRAN ODT) 4 MG disintegrating tablet 4mg  ODT q4 hours prn nausea/vomit 11/23/14  Yes Milton Ferguson, MD  pantoprazole (PROTONIX) 40 MG tablet Take 1 tablet (40 mg total) by mouth daily. 11/23/14  Yes Milton Ferguson, MD  EPINEPHrine (EPI-PEN) 0.3 mg/0.3 mL SOAJ injection Inject 0.3 mg into the muscle once.    Historical Provider, MD    Allergies as of 12/08/2014 - Review Complete 12/08/2014  Allergen Reaction Noted  . Flexeril [cyclobenzaprine] Hives 12/25/2012  . Ibuprofen Other (See Comments) 01/01/2014    Family History  Problem Relation Age of Onset  . Cancer Mother     kidney   . Asthma Mother   . Hypertension Mother   . Colon cancer Neg Hx     History   Social History  . Marital Status: Single    Spouse Name: N/A  . Number of Children: N/A  . Years of Education: N/A   Occupational History  . Housecleaning    Social History Main Topics  . Smoking status: Current Every Day Smoker -- 0.25 packs/day for 14 years    Types: Cigarettes  . Smokeless tobacco: Never Used     Comment: QUIT SMOKING ON JULY 11th. STATES WOULD ONLY HAVE A FEW CIGARETTES A WEEK PRIOR TO THIS.   [name] Alcohol Use: No     Comment: occ.  . Drug Use: No  . Sexual Activity: Yes    Birth Control/ Protection: None   Other Topics  Concern  . Not on file   Social History Narrative    Review of Systems: See HPI, otherwise negative ROS   Physical Exam: LMP 12/10/2014 General:   Alert,  pleasant and cooperative in NAD Head:  Normocephalic and atraumatic. Neck:  Supple; Lungs:  Clear throughout to auscultation.    Heart:  Regular rate and rhythm. Abdomen:  Soft, nontender and nondistended. Normal bowel sounds, without guarding, and without rebound.   Neurologic:  Alert and  oriented x4;  grossly normal neurologically.  Impression/Plan:     HEMATEMESIS  PLAN:  EGD TODAY

## 2014-12-10 NOTE — Discharge Instructions (Signed)
YOU THREW UP BLOOD DUE TO GASTRITIS AND SMALL BOWEL ULCERS. I biopsied your stomach.    INCREASE PRotonix TO twice daily.  STRICTLY AVOID ASPIRIN, BC/GOODY POWDERS, IBUPROFEN/MOTRIN, OR NAPROXEN/ALEVE BECAUSE YOU HAVE AN ULCER.  YOUR BIOPSY RESULTS WILL BE AVAILABLE IN MY CHART AFTER AUG 1 AND MY OFFICE WILL CONTACT YOU IN 10-14 DAYS WITH YOUR RESULTS.   FOLLOW UP IN 6 MOS.  Jan 30th @ 9:30am    UPPER ENDOSCOPY AFTER CARE Read the instructions outlined below and refer to this sheet in the next week. These discharge instructions provide you with general information on caring for yourself after you leave the hospital. While your treatment has been planned according to the most current medical practices available, unavoidable complications occasionally occur. If you have any problems or questions after discharge, call DR. FIELDS, [phone].  ACTIVITY  You may resume your regular activity, but move at a slower pace for the next 24 hours.   Take frequent rest periods for the next 24 hours.   Walking will help get rid of the air and reduce the bloated feeling in your belly (abdomen).   No driving for 24 hours (because of the medicine (anesthesia) used during the test).   You may shower.   Do not sign any important legal documents or operate any machinery for 24 hours (because of the anesthesia used during the test).    NUTRITION  Drink plenty of fluids.   You may resume your normal diet as instructed by your doctor.   Begin with a light meal and progress to your normal diet. Heavy or fried foods are harder to digest and may make you feel sick to your stomach (nauseated).   Avoid alcoholic beverages for 24 hours or as instructed.    MEDICATIONS  You may resume your normal medications.   WHAT YOU CAN EXPECT TODAY  Some feelings of bloating in the abdomen.   Passage of more gas than usual.    IF YOU HAD A BIOPSY TAKEN DURING THE UPPER ENDOSCOPY:  No aspirin  products for 14 days.   Eat a soft diet IF YOU HAVE NAUSEA, BLOATING, ABDOMINAL PAIN, OR VOMITING.    FINDING OUT THE RESULTS OF YOUR TEST Not all test results are available during your visit. DR. Oneida Alar WILL CALL YOU WITHIN 14 DAYS OF YOUR PROCEDUE WITH YOUR RESULTS. Do not assume everything is normal if you have not heard from DR. FIELDS, CALL HER OFFICE AT [phone].  SEEK IMMEDIATE MEDICAL ATTENTION AND CALL THE OFFICE: [phone] IF:  You have more than a spotting of blood in your stool.   Your belly is swollen (abdominal distention).   You are nauseated or vomiting.   You have a temperature over 101F.   You have abdominal pain or discomfort that is severe or gets worse throughout the day.  Ulcer Disease (Peptic Ulcer, Gastric Ulcer, Duodenal Ulcer) You have an ulcer. This may be in your stomach (gastric ulcer) or in the first part of your small bowel, the duodenum (duodenal ulcer). An ulcer is a break in the lining of the stomach or duodenum. The ulcer causes erosion into the deeper tissue.  CAUSES The stomach has a lining to protect itself from the acid that digests food. The lining can be damaged in two main ways:  The Helico Pylori bacteria (H. Pyolori) can infect the lining of the stomach and cause ulcers.   ASPIRIN/Nonsteroidal, anti-inflammatory medications (NSAIDS) can cause gastric ulcerations.   Smoking tobacco can increase  the acid in the stomach. This can lead to ulcers, and will impair healing of ulcers.   SYMPTOMS The problems (symptoms) of ulcer disease are usually a burning or gnawing of the mid-upper belly (abdomen). This is often worse on an empty stomach and may get better with food. This may be associated with feeling sick to your stomach (nausea), bloating, and vomiting. If the ulcer results in bleeding, it can cause:  Black, tarry stools.   Vomiting of bright red blood.   Vomiting coffee-ground-looking materials.   SEVERE ANEMIA  HOME CARE  INSTRUCTIONS Continue regular work and usual activities unless advised otherwise by your caregiver.  Avoid tobacco, alcohol, and caffeine. Tobacco use will decrease and slow the rates of healing.   Avoid foods that seem to aggravate or cause discomfort.   Special diets are not usually needed.   Gastritis  Gastritis is an inflammation (the body's way of reacting to injury and/or infection) of the stomach. It is often caused by viral or bacterial (germ) infections. It can also be caused BY ASPIRIN, BC/GOODY POWDER'S, (IBUPROFEN) MOTRIN, OR ALEVE (NAPROXEN), chemicals (including alcohol), SPICY FOODS, and medications. This illness may be associated with generalized malaise (feeling tired, not well), UPPER ABDOMINAL STOMACH cramps, and fever. One common bacterial cause of gastritis is an organism known as H. Pylori. This can be treated with antibiotics.

## 2014-12-10 NOTE — Progress Notes (Signed)
REVIEWED-NO ADDITIONAL RECOMMENDATIONS. 

## 2014-12-13 NOTE — Progress Notes (Signed)
CC'ED TO PCP 

## 2014-12-14 ENCOUNTER — Encounter (HOSPITAL_COMMUNITY): Payer: Self-pay | Admitting: Gastroenterology

## 2014-12-17 ENCOUNTER — Telehealth: Payer: Self-pay | Admitting: Gastroenterology

## 2014-12-17 NOTE — Telephone Encounter (Signed)
Tried to call with no answer  

## 2014-12-17 NOTE — Telephone Encounter (Signed)
PLEASE CALL PT. Her stomach Bx showed H. Pylori infection. She needs AMOXICILLIN 500 mg 2 po BID for 10 days and Biaxin 500 mg po bid for 10 days. She needs PROTONIX 40 mg BID for 10 days then 1 po DAILY for 3 mos. Med side effects include NAUSEA, VOMITING, DIARRHEA, ABDOMINAL pain, and metallic taste.  OPV JAN 30.

## 2014-12-20 NOTE — Telephone Encounter (Signed)
Pt is aware.  

## 2014-12-20 NOTE — Telephone Encounter (Signed)
OV MADE °

## 2014-12-20 NOTE — Telephone Encounter (Signed)
Rx's called to [facility] at [facility].

## 2014-12-21 ENCOUNTER — Encounter: Payer: Self-pay | Admitting: Obstetrics and Gynecology

## 2014-12-21 ENCOUNTER — Ambulatory Visit (INDEPENDENT_AMBULATORY_CARE_PROVIDER_SITE_OTHER): Payer: Medicaid Other | Admitting: Obstetrics and Gynecology

## 2014-12-21 VITALS — BP 120/76 | Ht 64.0 in | Wt 143.0 lb

## 2014-12-21 DIAGNOSIS — Z9889 Other specified postprocedural states: Secondary | ICD-10-CM

## 2014-12-21 DIAGNOSIS — D069 Carcinoma in situ of cervix, unspecified: Secondary | ICD-10-CM | POA: Insufficient documentation

## 2014-12-21 NOTE — Progress Notes (Signed)
   Subjective:  Valerie Huber is a 29 y.o. female now 2 weeks status post LEEP for CIN III.[name] Path: Cin iii with + endocervical margin. THE PATient has a lot of deep pelvic pain, which was present long before the Leep, and continues bilateral lower abd ache.  pt had Bright red blood in vomitus postop and is being seen by GI for endoscopy due to hx of ULCERS.  Review of Systems Negative except + dyspareunia   Diet:   bland   Bowel movements : normal.  Pain is controlled with current analgesics. Medications being used: otc meds,.  Also on biaxin protonix, zofran  Objective:  BP 120/76 mmHg  Ht 5\' 4"  (1.626 m)  Wt 143 lb (64.864 kg)  BMI 24.53 kg/m2  LMP 12/10/2014 General:Well developed, well nourished.  No acute distress. Abdomen: Bowel sounds normal, soft, non-tender. Pelvic Exam:    External Genitalia:  Normal.    Vagina: Normal    Cervix: Normal excellent healing s/p LEEP     Uterus:2nd degree descensus, retroverted, dull ache with contact.    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  DYSPARUENIA UTERINE RETROVERSION.  Plan:  1.Wound care discussed   2. . current medications.UNCHANGED  3. Activity restrictions: none 4. return to work: not applicable. 5. Follow up in 4 week  PT ADVISED TO SELF EXAM, AND GIVEN INFO RE RETROVERTED UTERUS AND DYSPAREUNIA  PT MAY CONSIDER VAG HYST FOR ABOVE REASONS.[name]

## 2014-12-21 NOTE — Progress Notes (Signed)
Patient ID: Valerie Huber, female   DOB: December 24, 1985, 29 y.o.   MRN: [id number] Pt here today for follow up after a LEEP procedure. Pt states that her pain has gotten worse. Pt states that she has noticed the same odor and discharge from her last visit.

## 2014-12-31 ENCOUNTER — Encounter: Payer: Self-pay | Admitting: Adult Health

## 2014-12-31 ENCOUNTER — Ambulatory Visit (INDEPENDENT_AMBULATORY_CARE_PROVIDER_SITE_OTHER): Payer: Medicaid Other | Admitting: Adult Health

## 2014-12-31 VITALS — BP 122/88 | HR 68 | Ht 64.0 in | Wt 145.5 lb

## 2014-12-31 DIAGNOSIS — R102 Pelvic and perineal pain: Secondary | ICD-10-CM

## 2014-12-31 DIAGNOSIS — N926 Irregular menstruation, unspecified: Secondary | ICD-10-CM | POA: Diagnosis not present

## 2014-12-31 DIAGNOSIS — N812 Incomplete uterovaginal prolapse: Secondary | ICD-10-CM

## 2014-12-31 DIAGNOSIS — N809 Endometriosis, unspecified: Secondary | ICD-10-CM

## 2014-12-31 HISTORY — DX: Irregular menstruation, unspecified: N92.6

## 2014-12-31 HISTORY — DX: Pelvic and perineal pain: R10.2

## 2014-12-31 HISTORY — DX: Incomplete uterovaginal prolapse: N81.2

## 2014-12-31 MED ORDER — HYDROCODONE-ACETAMINOPHEN 5-325 MG PO TABS: 1.0000 | ORAL_TABLET | Freq: Four times a day (QID) | ORAL | Status: DC | PRN

## 2014-12-31 NOTE — Patient Instructions (Signed)
Pelvic Pain Female pelvic pain can be caused by many different things and start from a variety of places. Pelvic pain refers to pain that is located in the lower half of the abdomen and between your hips. The pain may occur over a short period of time (acute) or may be reoccurring (chronic). The cause of pelvic pain may be related to disorders affecting the female reproductive organs (gynecologic), but it may also be related to the bladder, kidney stones, an intestinal complication, or muscle or skeletal problems. Getting help right away for pelvic pain is important, especially if there has been severe, sharp, or a sudden onset of unusual pain. It is also important to get help right away because some types of pelvic pain can be life threatening.  CAUSES  Below are only some of the causes of pelvic pain. The causes of pelvic pain can be in one of several categories.   Gynecologic.  Pelvic inflammatory disease.  Sexually transmitted infection.  Ovarian cyst or a twisted ovarian ligament (ovarian torsion).  Uterine lining that grows outside the uterus (endometriosis).  Fibroids, cysts, or tumors.  Ovulation.  Pregnancy.  Pregnancy that occurs outside the uterus (ectopic pregnancy).  Miscarriage.  Labor.  Abruption of the placenta or ruptured uterus.  Infection.  Uterine infection (endometritis).  Bladder infection.  Diverticulitis.  Miscarriage related to a uterine infection (septic abortion).  Bladder.  Inflammation of the bladder (cystitis).  Kidney stone(s).  Gastrointestinal.  Constipation.  Diverticulitis.  Neurologic.  Trauma.  Feeling pelvic pain because of mental or emotional causes (psychosomatic).  Cancers of the bowel or pelvis. EVALUATION  Your caregiver will want to take a careful history of your concerns. This includes recent changes in your health, a careful gynecologic history of your periods (menses), and a sexual history. Obtaining your family  history and medical history is also important. Your caregiver may suggest a pelvic exam. A pelvic exam will help identify the location and severity of the pain. It also helps in the evaluation of which organ system may be involved. In order to identify the cause of the pelvic pain and be properly treated, your caregiver may order tests. These tests may include:   A pregnancy test.  Pelvic ultrasonography.  An X-ray exam of the abdomen.  A urinalysis or evaluation of vaginal discharge.  Blood tests. HOME CARE INSTRUCTIONS   Only take over-the-counter or prescription medicines for pain, discomfort, or fever as directed by your caregiver.   Rest as directed by your caregiver.   Eat a balanced diet.   Drink enough fluids to make your urine clear or pale yellow, or as directed.   Avoid sexual intercourse if it causes pain.   Apply warm or cold compresses to the lower abdomen depending on which one helps the pain.   Avoid stressful situations.   Keep a journal of your pelvic pain. Write down when it started, where the pain is located, and if there are things that seem to be associated with the pain, such as food or your menstrual cycle.  Follow up with your caregiver as directed.  SEEK MEDICAL CARE IF:  Your medicine does not help your pain.  You have abnormal vaginal discharge. SEEK IMMEDIATE MEDICAL CARE IF:   You have heavy bleeding from the vagina.   Your pelvic pain increases.   You feel light-headed or faint.   You have chills.   You have pain with urination or blood in your urine.   You have uncontrolled diarrhea   or vomiting.   You have a fever or persistent symptoms for more than 3 days.  You have a fever and your symptoms suddenly get worse.   You are being physically or sexually abused.  MAKE SURE YOU:  Understand these instructions.  Will watch your condition.  Will get help if you are not doing well or get worse. Document Released:  03/27/2004 Document Revised: 09/14/2013 Document Reviewed: 08/20/2011 [facility] Patient Information 2015 [place], [place]. This information is not intended to replace advice given to you by your health care provider. Make sure you discuss any questions you have with your health care provider. Return in 4 days for [place] and then see Dr Glo Herring in 1-2 days

## 2014-12-31 NOTE — Progress Notes (Signed)
Subjective:     Patient ID: Valerie Huber, female   DOB: 1985-12-23, 29 y.o.   MRN: [id number]  HPI Valerie Huber is a 29 year old black female in complaining of pelvic pain and irregular period, had one 8/1 to 8/8 then again 8/17, pain worse when bleeding, has history of endometriosis. She is seeing Dr Oneida Alar and has being treated for an ulcer.She had a LEEP in July by Dr Glo Herring for [place]. She says she has not worked in 2 weeks due to pain in stomach.   Review of Systems Patient denies any headaches, hearing loss, fatigue, blurred vision, shortness of breath, chest pain, problems with bowel movements, urination, or intercourse. No joint pain or mood swings.See HPI for positives. Reviewed past medical,surgical, social and family history. Reviewed medications and allergies.     Objective:   Physical Exam BP 122/88 mmHg  Pulse 68  Ht '5\' 4"'  (1.626 m)  Wt 145 lb 8 oz (65.998 kg)  BMI 24.96 kg/m2  LMP 12/29/2014 Skin warm and dry.Pelvic: external genitalia is normal in appearance no lesions, vagina: period like blood,urethra has no lesions or masses noted, cervix: bulbous, uterus: normal size, shape and contour, mildly tender, no masses felt,has second degree uterine descensus, and posterior position, adnexa: no masses, has tenderness across lower pelvic area, right>left. Bladder is non tender and no masses felt. Neck: mid line trachea, normal thyroid, good ROM, no lymphadenopathy noted. Lungs: clear to ausculation bilaterally. Cardiovascular: regular rate and rhythm. GC/CHL obtained. Will get labs and [place] to assess, discussed surgery as option or lupron, she would like 1 more child if possible.    Assessment:     Pelvic pain Endometriosis by lap. Second degree uterine descensus Irregular period    Plan:     Check CBC,CMP,TSH and ESR GC/CHL sent  Rx Norco 5-325 mg #30 1 every 6 hours prn pain with no refills Return in 31 days for gyn [place] and then see Dr Glo Herring in 1-2 days to discuss  surgery vs lupron   Review handout on pelvic pain

## 2015-01-01 LAB — COMPREHENSIVE METABOLIC PANEL
ALT: 15 IU/L (ref 0–32)
AST: 12 IU/L (ref 0–40)
Albumin/Globulin Ratio: 1.7 (ref 1.1–2.5)
Albumin: 4.5 g/dL (ref 3.5–5.5)
Alkaline Phosphatase: 43 IU/L (ref 39–117)
BUN/Creatinine Ratio: 17 (ref 8–20)
BUN: 12 mg/dL (ref 6–20)
Bilirubin Total: <0.2 mg/dL (ref 0.0–1.2)
CALCIUM: 9.4 mg/dL (ref 8.7–10.2)
CO2: 22 mmol/L (ref 18–29)
CREATININE: 0.72 mg/dL (ref 0.57–1.00)
Chloride: 105 mmol/L (ref 97–108)
GFR, EST AFRICAN AMERICAN: 129 mL/min/{1.73_m2} (ref >59)
GFR, EST NON AFRICAN AMERICAN: 112 mL/min/{1.73_m2} (ref >59)
GLUCOSE: 90 mg/dL (ref 65–99)
Globulin, Total: 2.6 g/dL (ref 1.5–4.5)
Potassium: 4.2 mmol/L (ref 3.5–5.2)
Sodium: 144 mmol/L (ref 134–144)
TOTAL PROTEIN: 7.1 g/dL (ref 6.0–8.5)

## 2015-01-01 LAB — CBC
Hematocrit: 35.5 % (ref 34.0–46.6)
Hemoglobin: 11.6 g/dL (ref 11.1–15.9)
MCH: 30.9 pg (ref 26.6–33.0)
MCHC: 32.7 g/dL (ref 31.5–35.7)
MCV: 94 fL (ref 79–97)
PLATELETS: 267 10*3/uL (ref 150–379)
RBC: 3.76 x10E6/uL — AB (ref 3.77–5.28)
RDW: 12.7 % (ref 12.3–15.4)
WBC: 4.1 10*3/uL (ref 3.4–10.8)

## 2015-01-01 LAB — TSH: TSH: 0.684 u[IU]/mL (ref 0.450–4.500)

## 2015-01-01 LAB — SEDIMENTATION RATE: Sed Rate: 6 mm/hr (ref 0–32)

## 2015-01-02 LAB — GC/CHLAMYDIA PROBE AMP
Chlamydia trachomatis, NAA: NEGATIVE
Neisseria gonorrhoeae by PCR: NEGATIVE

## 2015-01-04 ENCOUNTER — Ambulatory Visit (INDEPENDENT_AMBULATORY_CARE_PROVIDER_SITE_OTHER): Payer: Medicaid Other

## 2015-01-04 ENCOUNTER — Telehealth: Payer: Self-pay | Admitting: Adult Health

## 2015-01-04 ENCOUNTER — Other Ambulatory Visit: Payer: Self-pay | Admitting: Adult Health

## 2015-01-04 DIAGNOSIS — R102 Pelvic and perineal pain: Secondary | ICD-10-CM

## 2015-01-04 DIAGNOSIS — N921 Excessive and frequent menstruation with irregular cycle: Secondary | ICD-10-CM | POA: Diagnosis not present

## 2015-01-04 NOTE — Progress Notes (Signed)
PELVIC US TA/TV: retroverted uterus w a 1.1 x .7 x 1.1cm submucosal fundal fibroid,EEC 4.89mm, normal ov's bilat (mobile),small amount of complex cul de sac fluid,mult nabothian cysts,pelvic discomfort during ultrasound

## 2015-01-04 NOTE — Telephone Encounter (Signed)
Left message to call about labs 

## 2015-01-06 ENCOUNTER — Ambulatory Visit: Payer: Medicaid Other | Admitting: Obstetrics and Gynecology

## 2015-01-06 ENCOUNTER — Telehealth: Payer: Self-pay | Admitting: Adult Health

## 2015-01-06 ENCOUNTER — Encounter: Payer: Self-pay | Admitting: *Deleted

## 2015-01-06 NOTE — Telephone Encounter (Signed)
Left message to call about US  

## 2015-01-10 ENCOUNTER — Telehealth: Payer: Self-pay | Admitting: *Deleted

## 2015-01-11 ENCOUNTER — Telehealth: Payer: Self-pay | Admitting: Obstetrics and Gynecology

## 2015-01-11 NOTE — Telephone Encounter (Signed)
Mailbox full, unable to leave msg. Pt has fu appt 01/21/15

## 2015-01-11 NOTE — Telephone Encounter (Signed)
Mailbox full

## 2015-01-18 ENCOUNTER — Ambulatory Visit: Payer: Medicaid Other | Admitting: Obstetrics and Gynecology

## 2015-01-21 ENCOUNTER — Encounter: Payer: Self-pay | Admitting: Obstetrics and Gynecology

## 2015-01-21 ENCOUNTER — Ambulatory Visit (INDEPENDENT_AMBULATORY_CARE_PROVIDER_SITE_OTHER): Payer: Medicaid Other | Admitting: Obstetrics and Gynecology

## 2015-01-21 VITALS — BP 120/76 | Ht 64.0 in | Wt 147.0 lb

## 2015-01-21 DIAGNOSIS — N871 Moderate cervical dysplasia: Secondary | ICD-10-CM

## 2015-01-21 DIAGNOSIS — Z9889 Other specified postprocedural states: Secondary | ICD-10-CM

## 2015-01-21 MED ORDER — ETHYNODIOL DIAC-ETH ESTRADIOL 1-35 MG-MCG PO TABS: 1.0000 | ORAL_TABLET | Freq: Every day | ORAL | Status: DC

## 2015-01-21 MED ORDER — ESTRADIOL 1 MG PO TABS: 1.0000 mg | ORAL_TABLET | Freq: Every day | ORAL | Status: DC

## 2015-01-21 NOTE — Progress Notes (Signed)
Patient ID: Amariyana Heacox, female   DOB: 02-08-1986, 29 y.o.   MRN: [id number]   [facility] Visit  Patient name: Sanvi Ehler MRN [id number]  Date of birth: 04/29/1986  CC & HPI:  Cinzia Devos is a 29 y.o. female presenting today for f/u of irregular period and severe pelvic pain. Pt reports she continues to bleed today and experience pain. Pt denies being on any birth control at this time and denies being sexually active currently. Pt, however, plans on having more children in the future. Pt notes a Hx of similar Sx and reports she was unsuccessfully treated with depo (stopping one year ago) and thereafter [facility] pills. Pt denies any current tobacco use, reporting she quit some months ago.    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 ROS:  Office visit for discussion only.   Pertinent History Reviewed:   Reviewed: Significant for endometriosis, irregular periods and pelvic pain.  Medical         Past Medical History  Diagnosis Date  . Asthma   . Vaginal Pap smear, abnormal   . Endometriosis   . Seizures   . Pelvic pain in female 12/31/2014  . Second degree uterine prolapse 12/31/2014  . Irregular periods 12/31/2014                              Surgical Hx:    Past Surgical History  Procedure Laterality Date  . Surgery for endometriosis  2005  . Leep    . Esophagogastroduodenoscopy N/A 12/10/2014    Procedure: ESOPHAGOGASTRODUODENOSCOPY (EGD);  Surgeon: Danie Binder, MD;  Location: [facility];  Service: Endoscopy;  Laterality: N/A;  1415 - moved to 8:45 - office to notify   Medications: Reviewed & Updated - see associated section                       Current outpatient prescriptions:  .  acetaminophen (TYLENOL) 500 MG tablet, Take 1,000 mg by mouth  every 6 (six) hours as needed for moderate pain., Disp: , Rfl:  .  amoxicillin (AMOXIL) 500 MG capsule, Take 500 mg by mouth 3 (three) times daily., Disp: , Rfl:  .  clarithromycin (BIAXIN) 500 MG tablet, Take 500 mg by mouth 2 (two) times daily., Disp: , Rfl:  .  EPINEPHrine (EPI-PEN) 0.3 mg/0.3 mL SOAJ injection, Inject 0.3 mg into the muscle once., Disp: , Rfl:  .  HYDROcodone-acetaminophen (NORCO/VICODIN) 5-325 MG per tablet, Take 1 tablet by mouth every 6 (six) hours as needed., Disp: 30 tablet, Rfl: 0 .  pantoprazole (PROTONIX) 40 MG tablet, Take 1 tablet (40 mg total) by mouth 2 (two) times daily., Disp: 60 tablet, Rfl: 3   Social History: Reviewed -  reports that she has quit smoking. Her smoking use included Cigarettes. She has a 3.5 pack-year smoking history. She has never used smokeless tobacco.  Objective Findings:  Vitals: Blood pressure 120/76, height 5\' 4"  (1.626 m), weight 147 lb (66.679 kg), last menstrual period 12/29/2014.  Physical Examination: Office visit for discussion only.     Assessment & Plan:   A:  1. Discussed results of pelvic sonogram for menorrhagia/ pelvic pain 2. Discussed colposcopy & need  for further tx for endocervical margin involvement at initial leep 3. Higher dose BC pill to combat bleeding.   P:  1. Colposcopy & button of endocervical tissue to be excised in 2 months.  2. In 2 months procedure for further LEEP.  3. Pt advised to keep calendar documenting pain levels and bleeding.  4Rx OCP 1/35, daily, with estradiol 1 mg on days of breakthru.    This chart was SCRIBED for Mallory Shirk, MD by Terressa Koyanagi, ED Scribe. This patient was seen in room office, and the patient's care was started at 10:32 AM. `````Attestation of Attending Supervision of Advanced Practitioner: Evaluation and management procedures were performed by the PA/NP/CNM/OB Fellow under my supervision/collaboration. Chart reviewed and agree with management and  plan.  Kendalynn Wideman V 01/22/2015 9:41 PM

## 2015-01-21 NOTE — Progress Notes (Signed)
Patient ID: Valerie Huber, female   DOB: 01/09/1986, 29 y.o.   MRN: [id number] Pt here today for follow up and go over results. Pt states that she is still having some bleeding.

## 2015-01-22 DIAGNOSIS — N871 Moderate cervical dysplasia: Secondary | ICD-10-CM | POA: Insufficient documentation

## 2015-02-18 ENCOUNTER — Encounter: Payer: Medicaid Other | Admitting: Obstetrics and Gynecology

## 2015-02-25 ENCOUNTER — Encounter: Payer: Medicaid Other | Admitting: Obstetrics and Gynecology

## 2015-04-14 ENCOUNTER — Ambulatory Visit (INDEPENDENT_AMBULATORY_CARE_PROVIDER_SITE_OTHER): Payer: Medicaid Other | Admitting: Adult Health

## 2015-04-14 ENCOUNTER — Encounter: Payer: Self-pay | Admitting: Adult Health

## 2015-04-14 VITALS — BP 108/60 | HR 82 | Ht 64.0 in | Wt 154.5 lb

## 2015-04-14 DIAGNOSIS — O3680X0 Pregnancy with inconclusive fetal viability, not applicable or unspecified: Secondary | ICD-10-CM

## 2015-04-14 DIAGNOSIS — Z3490 Encounter for supervision of normal pregnancy, unspecified, unspecified trimester: Secondary | ICD-10-CM

## 2015-04-14 DIAGNOSIS — N926 Irregular menstruation, unspecified: Secondary | ICD-10-CM

## 2015-04-14 DIAGNOSIS — Z3201 Encounter for pregnancy test, result positive: Secondary | ICD-10-CM

## 2015-04-14 HISTORY — DX: Encounter for supervision of normal pregnancy, unspecified, unspecified trimester: Z34.90

## 2015-04-14 LAB — POCT URINE PREGNANCY: PREG TEST UR: POSITIVE — AB

## 2015-04-14 MED ORDER — PRENATAL PLUS 27-1 MG PO TABS: 1.0000 | ORAL_TABLET | Freq: Every day | ORAL | Status: DC

## 2015-04-14 NOTE — Progress Notes (Signed)
Subjective:     Patient ID: Valerie Huber, female   DOB: August 04, 1985, 29 y.o.   MRN: [id number]  HPI Valerie Huber is a 29 year old black female in for UPT has missed a period and had +HPT.  Review of Systems Patient denies any headaches, hearing loss, fatigue, blurred vision, shortness of breath, chest pain, abdominal pain, problems with bowel movements, urination, or intercourse. No joint pain or mood swings. Reviewed past medical,surgical, social and family history. Reviewed medications and allergies.     Objective:   Physical Exam BP 108/60 mmHg  Pulse 82  Ht 5\' 4"  (1.626 m)  Wt 154 lb 8 oz (70.081 kg)  BMI 26.51 kg/m2  LMP 02/12/2015 (Approximate) UPT +, about 8+5 week sby LMP with EDD 11/19/15,medicaid form given, Skin warm and dry. Neck: mid line trachea, normal thyroid, good ROM, no lymphadenopathy noted. Lungs: clear to ausculation bilaterally. Cardiovascular: regular rate and rhythm.Abdomen soft non tender, uterus retroverted,can not see on abdominal US, will get in 12/5 for [place].    Assessment:     Pregnant     Plan:    Rx prenatal plus #30 take 1 daily with 11 refills Return 12/5 for dating [place] Review handout on first trimester

## 2015-04-14 NOTE — Patient Instructions (Signed)
Return 12/5 for dating [place] First Trimester of Pregnancy The first trimester of pregnancy is from week 1 until the end of week 12 (months 1 through 3). A week after a sperm fertilizes an egg, the egg will implant on the wall of the uterus. This embryo will begin to develop into a baby. Genes from you and your partner are forming the baby. The female genes determine whether the baby is a boy or a girl. At 6-8 weeks, the eyes and face are formed, and the heartbeat can be seen on ultrasound. At the end of 12 weeks, all the baby's organs are formed.  Now that you are pregnant, you will want to do everything you can to have a healthy baby. Two of the most important things are to get good prenatal care and to follow your health care provider's instructions. Prenatal care is all the medical care you receive before the baby's birth. This care will help prevent, find, and treat any problems during the pregnancy and childbirth. BODY CHANGES Your body goes through many changes during pregnancy. The changes vary from woman to woman.   You may gain or lose a couple of pounds at first.  You may feel sick to your stomach (nauseous) and throw up (vomit). If the vomiting is uncontrollable, call your health care provider.  You may tire easily.  You may develop headaches that can be relieved by medicines approved by your health care provider.  You may urinate more often. Painful urination may mean you have a bladder infection.  You may develop heartburn as a result of your pregnancy.  You may develop constipation because certain hormones are causing the muscles that push waste through your intestines to slow down.  You may develop hemorrhoids or swollen, bulging veins (varicose veins).  Your breasts may begin to grow larger and become tender. Your nipples may stick out more, and the tissue that surrounds them (areola) may become darker.  Your gums may bleed and may be sensitive to brushing and flossing.  Dark  spots or blotches (chloasma, mask of pregnancy) may develop on your face. This will likely fade after the baby is born.  Your menstrual periods will stop.  You may have a loss of appetite.  You may develop cravings for certain kinds of food.  You may have changes in your emotions from day to day, such as being excited to be pregnant or being concerned that something may go wrong with the pregnancy and baby.  You may have more vivid and strange dreams.  You may have changes in your hair. These can include thickening of your hair, rapid growth, and changes in texture. Some women also have hair loss during or after pregnancy, or hair that feels dry or thin. Your hair will most likely return to normal after your baby is born. WHAT TO EXPECT AT YOUR PRENATAL VISITS During a routine prenatal visit:  You will be weighed to make sure you and the baby are growing normally.  Your blood pressure will be taken.  Your abdomen will be measured to track your baby's growth.  The fetal heartbeat will be listened to starting around week 10 or 12 of your pregnancy.  Test results from any previous visits will be discussed. Your health care provider may ask you:  How you are feeling.  If you are feeling the baby move.  If you have had any abnormal symptoms, such as leaking fluid, bleeding, severe headaches, or abdominal cramping.  If you  are using any tobacco products, including cigarettes, chewing tobacco, and electronic cigarettes.  If you have any questions. Other tests that may be performed during your first trimester include:  Blood tests to find your blood type and to check for the presence of any previous infections. They will also be used to check for low iron levels (anemia) and Rh antibodies. Later in the pregnancy, blood tests for diabetes will be done along with other tests if problems develop.  Urine tests to check for infections, diabetes, or protein in the urine.  An ultrasound to  confirm the proper growth and development of the baby.  An amniocentesis to check for possible genetic problems.  Fetal screens for spina bifida and Down syndrome.  You may need other tests to make sure you and the baby are doing well.  HIV (human immunodeficiency virus) testing. Routine prenatal testing includes screening for HIV, unless you choose not to have this test. HOME CARE INSTRUCTIONS  Medicines  Follow your health care provider's instructions regarding medicine use. Specific medicines may be either safe or unsafe to take during pregnancy.  Take your prenatal vitamins as directed.  If you develop constipation, try taking a stool softener if your health care provider approves. Diet  Eat regular, well-balanced meals. Choose a variety of foods, such as meat or vegetable-based protein, fish, milk and low-fat dairy products, vegetables, fruits, and whole grain breads and cereals. Your health care provider will help you determine the amount of weight gain that is right for you.  Avoid raw meat and uncooked cheese. These carry germs that can cause birth defects in the baby.  Eating four or five small meals rather than three large meals a day may help relieve nausea and vomiting. If you start to feel nauseous, eating a few soda crackers can be helpful. Drinking liquids between meals instead of during meals also seems to help nausea and vomiting.  If you develop constipation, eat more high-fiber foods, such as fresh vegetables or fruit and whole grains. Drink enough fluids to keep your urine clear or pale yellow. Activity and Exercise  Exercise only as directed by your health care provider. Exercising will help you:  Control your weight.  Stay in shape.  Be prepared for labor and delivery.  Experiencing pain or cramping in the lower abdomen or low back is a good sign that you should stop exercising. Check with your health care provider before continuing normal exercises.  Try  to avoid standing for long periods of time. Move your legs often if you must stand in one place for a long time.  Avoid heavy lifting.  Wear low-heeled shoes, and practice good posture.  You may continue to have sex unless your health care provider directs you otherwise. Relief of Pain or Discomfort  Wear a good support bra for breast tenderness.   Take warm sitz baths to soothe any pain or discomfort caused by hemorrhoids. Use hemorrhoid cream if your health care provider approves.   Rest with your legs elevated if you have leg cramps or low back pain.  If you develop varicose veins in your legs, wear support hose. Elevate your feet for 15 minutes, 3-4 times a day. Limit salt in your diet. Prenatal Care  Schedule your prenatal visits by the twelfth week of pregnancy. They are usually scheduled monthly at first, then more often in the last 2 months before delivery.  Write down your questions. Take them to your prenatal visits.  Keep all your prenatal  visits as directed by your health care provider. Safety  Wear your seat belt at all times when driving.  Make a list of emergency phone numbers, including numbers for family, friends, the hospital, and police and fire departments. General Tips  Ask your health care provider for a referral to a local prenatal education class. Begin classes no later than at the beginning of month 6 of your pregnancy.  Ask for help if you have counseling or nutritional needs during pregnancy. Your health care provider can offer advice or refer you to specialists for help with various needs.  Do not use hot tubs, steam rooms, or saunas.  Do not douche or use tampons or scented sanitary pads.  Do not cross your legs for long periods of time.  Avoid cat litter boxes and soil used by cats. These carry germs that can cause birth defects in the baby and possibly loss of the fetus by miscarriage or stillbirth.  Avoid all smoking, herbs, alcohol, and  medicines not prescribed by your health care provider. Chemicals in these affect the formation and growth of the baby.  Do not use any tobacco products, including cigarettes, chewing tobacco, and electronic cigarettes. If you need help quitting, ask your health care provider. You may receive counseling support and other resources to help you quit.  Schedule a dentist appointment. At home, brush your teeth with a soft toothbrush and be gentle when you floss. SEEK MEDICAL CARE IF:   You have dizziness.  You have mild pelvic cramps, pelvic pressure, or nagging pain in the abdominal area.  You have persistent nausea, vomiting, or diarrhea.  You have a bad smelling vaginal discharge.  You have pain with urination.  You notice increased swelling in your face, hands, legs, or ankles. SEEK IMMEDIATE MEDICAL CARE IF:   You have a fever.  You are leaking fluid from your vagina.  You have spotting or bleeding from your vagina.  You have severe abdominal cramping or pain.  You have rapid weight gain or loss.  You vomit blood or material that looks like coffee grounds.  You are exposed to [place] measles and have never had them.  You are exposed to fifth disease or chickenpox.  You develop a severe headache.  You have shortness of breath.  You have any kind of trauma, such as from a fall or a car accident.   This information is not intended to replace advice given to you by your health care provider. Make sure you discuss any questions you have with your health care provider.   Document Released: 04/24/2001 Document Revised: 05/21/2014 Document Reviewed: 03/10/2013 Elsevier Interactive Patient Education [employer].

## 2015-04-18 ENCOUNTER — Ambulatory Visit (INDEPENDENT_AMBULATORY_CARE_PROVIDER_SITE_OTHER): Payer: Medicaid Other

## 2015-04-18 DIAGNOSIS — O3680X0 Pregnancy with inconclusive fetal viability, not applicable or unspecified: Secondary | ICD-10-CM

## 2015-04-18 DIAGNOSIS — Z3A01 Less than 8 weeks gestation of pregnancy: Secondary | ICD-10-CM | POA: Diagnosis not present

## 2015-04-18 NOTE — Progress Notes (Signed)
[place] 5+4wks,single IUP w/ys,pos fht 92bpm,normal ov's bilat,retroverted uterus,crl 2.63mm,f/u ultrasound in 10 days to recheck fht per [facility] [place].

## 2015-04-21 ENCOUNTER — Other Ambulatory Visit: Payer: Self-pay | Admitting: Adult Health

## 2015-04-21 DIAGNOSIS — O3680X0 Pregnancy with inconclusive fetal viability, not applicable or unspecified: Secondary | ICD-10-CM

## 2015-04-28 ENCOUNTER — Encounter: Payer: Medicaid Other | Admitting: Women's Health

## 2015-04-28 ENCOUNTER — Other Ambulatory Visit: Payer: Self-pay | Admitting: Adult Health

## 2015-04-28 ENCOUNTER — Ambulatory Visit (INDEPENDENT_AMBULATORY_CARE_PROVIDER_SITE_OTHER): Payer: Medicaid Other

## 2015-04-28 DIAGNOSIS — Z3A01 Less than 8 weeks gestation of pregnancy: Secondary | ICD-10-CM | POA: Diagnosis not present

## 2015-04-28 DIAGNOSIS — O3680X0 Pregnancy with inconclusive fetal viability, not applicable or unspecified: Secondary | ICD-10-CM

## 2015-04-28 NOTE — Progress Notes (Signed)
[place] 7wks measurements c/w dates,fhr 144bpm,normal ov's bilat,crl 12.16mm

## 2015-05-10 ENCOUNTER — Ambulatory Visit (INDEPENDENT_AMBULATORY_CARE_PROVIDER_SITE_OTHER): Payer: Medicaid Other | Admitting: Advanced Practice Midwife

## 2015-05-10 ENCOUNTER — Encounter: Payer: Self-pay | Admitting: Advanced Practice Midwife

## 2015-05-10 VITALS — BP 110/70 | HR 78 | Wt 158.0 lb

## 2015-05-10 DIAGNOSIS — Z1389 Encounter for screening for other disorder: Secondary | ICD-10-CM

## 2015-05-10 DIAGNOSIS — Z3481 Encounter for supervision of other normal pregnancy, first trimester: Secondary | ICD-10-CM | POA: Diagnosis not present

## 2015-05-10 DIAGNOSIS — Z369 Encounter for antenatal screening, unspecified: Secondary | ICD-10-CM

## 2015-05-10 DIAGNOSIS — Z0283 Encounter for blood-alcohol and blood-drug test: Secondary | ICD-10-CM

## 2015-05-10 DIAGNOSIS — Z331 Pregnant state, incidental: Secondary | ICD-10-CM

## 2015-05-10 DIAGNOSIS — Z3A08 8 weeks gestation of pregnancy: Secondary | ICD-10-CM

## 2015-05-10 DIAGNOSIS — Z3490 Encounter for supervision of normal pregnancy, unspecified, unspecified trimester: Secondary | ICD-10-CM | POA: Insufficient documentation

## 2015-05-10 DIAGNOSIS — Z3491 Encounter for supervision of normal pregnancy, unspecified, first trimester: Secondary | ICD-10-CM

## 2015-05-10 LAB — POCT URINALYSIS DIPSTICK
Blood, UA: NEGATIVE
GLUCOSE UA: NEGATIVE
Ketones, UA: NEGATIVE
LEUKOCYTES UA: NEGATIVE
NITRITE UA: NEGATIVE
Protein, UA: NEGATIVE

## 2015-05-10 NOTE — Progress Notes (Signed)
Subjective:    Valerie Huber is a [id number] [redacted]w[redacted]d being seen today for her first obstetrical visit.  Her obstetrical history is significant for TAB X2 and uncomplicated SVD in [id number].  Pregnancy history fully reviewed.  Patient reports some mild nausea, declines meds. 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    Filed Vitals:   05/10/15 1354  BP: 110/70  Pulse: 78  Weight: 158 lb (71.668 kg)    HISTORY: OB History  Gravida Para Term Preterm AB SAB TAB Ectopic Multiple Living  4 1 1  2  2   2     # Outcome Date GA Lbr Len/2nd Weight Sex Delivery Anes PTL Lv  4 Current           3 Term 10/07/05 [redacted]w[redacted]d  7 lb 5 oz (3.317 kg) F Vag-Spont EPI N Y  2 TAB           1 TAB              Past Medical History  Diagnosis Date  . Asthma   . Vaginal Pap smear, abnormal   . Endometriosis   . Seizures ([place])   . Pelvic pain in female 12/31/2014  . Second degree uterine prolapse 12/31/2014  . Irregular periods 12/31/2014  . Pregnant 04/14/2015   Past Surgical History  Procedure Laterality Date  . Surgery for endometriosis  2005  . Leep    . Esophagogastroduodenoscopy N/A 12/10/2014    Procedure: ESOPHAGOGASTRODUODENOSCOPY (EGD);  Surgeon: Danie Binder, MD;  Location: [facility];  Service: Endoscopy;  Laterality: N/A;  1415 - moved to 8:45 - office to notify   Family History  Problem Relation Age of Onset  . Cancer Mother     kidney   . Asthma Mother   . Hypertension Mother   . Colon cancer Neg Hx   . Cancer Paternal Grandmother      Exam                                      System:     Skin: normal coloration and turgor, no rashes    Neurologic: oriented, normal, normal mood   Extremities: normal strength, tone, and muscle mass   HEENT PERRLA   Mouth/Teeth mucous membranes moist, normal  dentition   Neck supple and no masses   Cardiovascular: regular rate and rhythm   Respiratory:  appears well, vitals normal, no respiratory distress, acyanotic   Abdomen: soft, non-tender;   FHR: 160 [place]          Assessment:    Pregnancy: [id number] Patient Active Problem List   Diagnosis Date Noted  . Supervision of normal pregnancy 05/10/2015  . Moderate dysplasia of cervix (CIN II)  at endocervical margin of LEEP specimen 01/22/2015  . Pelvic pain in female 12/31/2014  . Endometriosis determined by laparoscopy 12/31/2014  . Second degree uterine prolapse 12/31/2014  . Irregular periods 12/31/2014  . 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 Hematemesis with nausea   . Hematemesis 12/08/2014        Plan:     Initial labs drawn. Continue prenatal vitamins  Problem list reviewed and updated  Reviewed n/v relief measures and warning s/s to report  Reviewed recommended weight gain based on pre-gravid BMI  Encouraged well-balanced diet Genetic Screening discussed Integrated Screen: requested.  Ultrasound discussed; fetal survey: requested.  Return for LROB, US:NT+1st IT.  CRESENZO-DISHMAN,Marvelyn Bouchillon 05/10/2015

## 2015-05-10 NOTE — Patient Instructions (Signed)

## 2015-05-11 LAB — URINE CULTURE: ORGANISM ID, BACTERIA: NO GROWTH

## 2015-05-15 NOTE — L&D Delivery Note (Signed)
Delivery Note At 12:59 AM a viable female was delivered via  (Presentation: direct OA). Compound hand and loose nuchal cord x 1.  APGAR: 8,9 ; weight  Pending.   Placenta status: Intact, Spontaneous.  Cord:  3 vessel with the following complications: none. SOOL with SROM, no augmentation. GBS inadequately treated.  Anesthesia:  Epidural Episiotomy:  none Lacerations:  none Suture Repair: none Est. Blood Loss (mL):  150mL  Mom to postpartum.  Baby to Couplet care / Skin to Skin.  Ralene Ok 12/02/2015, 1:24 AM   I was present for the above  CRESENZO-DISHMAN,[place] Teters

## 2015-05-20 LAB — CBC
HEMOGLOBIN: 10.9 g/dL — AB (ref 11.1–15.9)
Hematocrit: 32 % — ABNORMAL LOW (ref 34.0–46.6)
MCH: 31.8 pg (ref 26.6–33.0)
MCHC: 34.1 g/dL (ref 31.5–35.7)
MCV: 93 fL (ref 79–97)
Platelets: 258 10*3/uL (ref 150–379)
RBC: 3.43 x10E6/uL — ABNORMAL LOW (ref 3.77–5.28)
RDW: 12.5 % (ref 12.3–15.4)
WBC: 5.3 10*3/uL (ref 3.4–10.8)

## 2015-05-20 LAB — URINALYSIS, ROUTINE W REFLEX MICROSCOPIC
Bilirubin, UA: NEGATIVE
Glucose, UA: NEGATIVE
Ketones, UA: NEGATIVE
NITRITE UA: NEGATIVE
PH UA: 6.5 (ref 5.0–7.5)
Protein, UA: NEGATIVE
Specific Gravity, UA: 1.021 (ref 1.005–1.030)
UUROB: 0.2 mg/dL (ref 0.2–1.0)

## 2015-05-20 LAB — PMP SCREEN PROFILE (10S), URINE
AMPHETAMINE SCRN UR: NEGATIVE ng/mL
Barbiturate Screen, Ur: NEGATIVE ng/mL
Benzodiazepine Screen, Urine: NEGATIVE ng/mL
COCAINE(METAB.) SCREEN, URINE: NEGATIVE ng/mL
Cannabinoids Ur Ql Scn: POSITIVE ng/mL
Creatinine(Crt), U: 143.5 mg/dL (ref 20.0–300.0)
METHADONE SCREEN, URINE: NEGATIVE ng/mL
OPIATE SCRN UR: NEGATIVE ng/mL
Oxycodone+Oxymorphone Ur Ql Scn: NEGATIVE ng/mL
PCP SCRN UR: NEGATIVE ng/mL
PROPOXYPHENE SCREEN: NEGATIVE ng/mL
Ph of Urine: 6.5 (ref 4.5–8.9)

## 2015-05-20 LAB — CYSTIC FIBROSIS MUTATION 97: GENE DIS ANAL CARRIER INTERP BLD/T-IMP: NOT DETECTED

## 2015-05-20 LAB — RPR: RPR: NONREACTIVE

## 2015-05-20 LAB — ANTIBODY SCREEN: ANTIBODY SCREEN: NEGATIVE

## 2015-05-20 LAB — MICROSCOPIC EXAMINATION: Casts: NONE SEEN /lpf

## 2015-05-20 LAB — ABO/RH: RH TYPE: POSITIVE

## 2015-05-20 LAB — RUBELLA SCREEN: Rubella Antibodies, IGG: 5.9 index (ref >0.99)

## 2015-05-20 LAB — HEPATITIS B SURFACE ANTIGEN: HEP B S AG: NEGATIVE

## 2015-05-20 LAB — SICKLE CELL SCREEN: Sickle Cell Screen: NEGATIVE

## 2015-05-20 LAB — VARICELLA ZOSTER ANTIBODY, IGG: VARICELLA: 514 {index} (ref >165)

## 2015-06-06 ENCOUNTER — Other Ambulatory Visit: Payer: Self-pay | Admitting: Advanced Practice Midwife

## 2015-06-06 DIAGNOSIS — Z3682 Encounter for antenatal screening for nuchal translucency: Secondary | ICD-10-CM

## 2015-06-07 ENCOUNTER — Encounter: Payer: Self-pay | Admitting: Women's Health

## 2015-06-07 ENCOUNTER — Ambulatory Visit (INDEPENDENT_AMBULATORY_CARE_PROVIDER_SITE_OTHER): Payer: Medicaid Other | Admitting: Women's Health

## 2015-06-07 ENCOUNTER — Ambulatory Visit (INDEPENDENT_AMBULATORY_CARE_PROVIDER_SITE_OTHER): Payer: Medicaid Other

## 2015-06-07 VITALS — BP 102/62 | HR 64 | Wt 158.0 lb

## 2015-06-07 DIAGNOSIS — Z3491 Encounter for supervision of normal pregnancy, unspecified, first trimester: Secondary | ICD-10-CM

## 2015-06-07 DIAGNOSIS — Z3682 Encounter for antenatal screening for nuchal translucency: Secondary | ICD-10-CM

## 2015-06-07 DIAGNOSIS — Z1389 Encounter for screening for other disorder: Secondary | ICD-10-CM

## 2015-06-07 DIAGNOSIS — Z331 Pregnant state, incidental: Secondary | ICD-10-CM

## 2015-06-07 DIAGNOSIS — Z36 Encounter for antenatal screening of mother: Secondary | ICD-10-CM

## 2015-06-07 LAB — POCT URINALYSIS DIPSTICK
GLUCOSE UA: NEGATIVE
KETONES UA: NEGATIVE
LEUKOCYTES UA: NEGATIVE
Nitrite, UA: NEGATIVE
Protein, UA: NEGATIVE

## 2015-06-07 NOTE — Patient Instructions (Signed)

## 2015-06-07 NOTE — Progress Notes (Signed)
[place] 12+5wks,measurements c/w dates,crl 74.43mm,unable to see rt ov,normal lt ov,NB present,NT 1.39mm,fhr 158bpm,ant pl gr 0

## 2015-06-07 NOTE — Progress Notes (Signed)
Low-risk OB appointment [id number] [redacted]w[redacted]d Estimated Date of Delivery: 12/15/15 BP 102/62 mmHg  Pulse 64  Wt 158 lb (71.668 kg)  LMP 02/12/2015 (Approximate)  BP, weight, and urine reviewed.  Refer to obstetrical flow sheet for FH & FHR.  No fm yet. Denies cramping, lof, vb, or uti s/s. No complaints. Reviewed today's normal nt u/s, warning s/s to report. Plan:  Continue routine obstetrical care  F/U in 4wks for OB appointment w/ pap, and 2nd IT

## 2015-06-09 LAB — MATERNAL SCREEN, INTEGRATED #1
CROWN RUMP LENGTH MAT SCREEN: 74 mm
GEST. AGE ON COLLECTION DATE: 13.1 wk
MATERNAL AGE AT EDD: 32.3 a
NUCHAL TRANSLUCENCY (NT): 1.6 mm
NUMBER OF FETUSES: 1
PAPP-A Value: 3033.2 ng/mL
WEIGHT: 158 [lb_av]

## 2015-06-13 ENCOUNTER — Ambulatory Visit: Payer: Medicaid Other | Admitting: Nurse Practitioner

## 2015-06-13 ENCOUNTER — Telehealth: Payer: Self-pay | Admitting: Nurse Practitioner

## 2015-06-13 ENCOUNTER — Encounter: Payer: Self-pay | Admitting: Nurse Practitioner

## 2015-06-13 NOTE — Telephone Encounter (Signed)
Noted  

## 2015-06-13 NOTE — Telephone Encounter (Signed)
PATIENT WAS A NO SHOW AND LETTER SENT  °

## 2015-07-05 ENCOUNTER — Ambulatory Visit (INDEPENDENT_AMBULATORY_CARE_PROVIDER_SITE_OTHER): Payer: Medicaid Other | Admitting: Women's Health

## 2015-07-05 ENCOUNTER — Encounter: Payer: Self-pay | Admitting: Women's Health

## 2015-07-05 ENCOUNTER — Other Ambulatory Visit (HOSPITAL_COMMUNITY)
Admission: RE | Admit: 2015-07-05 | Discharge: 2015-07-05 | Disposition: A | Discharge status: Home / Self Care | Payer: Medicaid Other | Source: Ambulatory Visit | Attending: Obstetrics & Gynecology | Admitting: Obstetrics & Gynecology

## 2015-07-05 VITALS — BP 100/64 | HR 76 | Wt 159.0 lb

## 2015-07-05 DIAGNOSIS — Z1151 Encounter for screening for human papillomavirus (HPV): Secondary | ICD-10-CM | POA: Diagnosis present

## 2015-07-05 DIAGNOSIS — Z01419 Encounter for gynecological examination (general) (routine) without abnormal findings: Secondary | ICD-10-CM | POA: Diagnosis present

## 2015-07-05 DIAGNOSIS — Z113 Encounter for screening for infections with a predominantly sexual mode of transmission: Secondary | ICD-10-CM | POA: Insufficient documentation

## 2015-07-05 DIAGNOSIS — Z363 Encounter for antenatal screening for malformations: Secondary | ICD-10-CM

## 2015-07-05 DIAGNOSIS — Z3492 Encounter for supervision of normal pregnancy, unspecified, second trimester: Secondary | ICD-10-CM

## 2015-07-05 DIAGNOSIS — Z124 Encounter for screening for malignant neoplasm of cervix: Secondary | ICD-10-CM

## 2015-07-05 DIAGNOSIS — F1290 Cannabis use, unspecified, uncomplicated: Secondary | ICD-10-CM

## 2015-07-05 DIAGNOSIS — Z1389 Encounter for screening for other disorder: Secondary | ICD-10-CM

## 2015-07-05 DIAGNOSIS — Z3A17 17 weeks gestation of pregnancy: Secondary | ICD-10-CM

## 2015-07-05 DIAGNOSIS — F1210 Cannabis abuse, uncomplicated: Secondary | ICD-10-CM

## 2015-07-05 DIAGNOSIS — Z3682 Encounter for antenatal screening for nuchal translucency: Secondary | ICD-10-CM

## 2015-07-05 DIAGNOSIS — Z331 Pregnant state, incidental: Secondary | ICD-10-CM

## 2015-07-05 NOTE — Addendum Note (Signed)
Addended by: Traci Sermon A on: 07/05/2015 03:43 PM   Modules accepted: Orders

## 2015-07-05 NOTE — Patient Instructions (Signed)

## 2015-07-05 NOTE — Progress Notes (Signed)
Low-risk OB appointment [id number] [redacted]w[redacted]d Estimated Date of Delivery: 12/15/15 BP 100/64 mmHg  Pulse 76  Wt 159 lb (72.122 kg)  LMP 02/12/2015 (Approximate)  BP, weight reviewed.  Unable to void, voided right before she came. Refer to obstetrical flow sheet for FH & FHR.  Has been feeling a lot of fm. Denies cramping, lof, vb, or uti s/s. No complaints. Declines flu shot Thin prep pap obtained Reviewed warning s/s to report. Plan:  Continue routine obstetrical care  F/U in 3wks for OB appointment and anatomy u/s 2nd IT today

## 2015-07-07 LAB — MATERNAL SCREEN, INTEGRATED #2
AFP MOM: 1.19
Alpha-Fetoprotein: 44.4 ng/mL
CROWN RUMP LENGTH: 74 mm
DIA MoM: 1.92
DIA VALUE: 317 pg/mL
Estriol, Unconjugated: 1.72 ng/mL
GEST. AGE ON COLLECTION DATE: 13.1 wk
GESTATIONAL AGE: 17.1 wk
HCG VALUE: 14.3 [IU]/mL
MATERNAL AGE AT EDD: 32.3 a
Nuchal Translucency (NT): 1.6 mm
Nuchal Translucency MoM: 0.88
Number of Fetuses: 1
PAPP-A MOM: 2.56
PAPP-A VALUE: 3033.2 ng/mL
Test Results:: NEGATIVE
WEIGHT: 159 [lb_av]
Weight: 158 [lb_av]
hCG MoM: 0.53
uE3 MoM: 1.61

## 2015-07-11 LAB — CYTOLOGY - PAP

## 2015-07-13 ENCOUNTER — Emergency Department (HOSPITAL_COMMUNITY)
Admission: EM | Admit: 2015-07-13 | Discharge: 2015-07-13 | Disposition: A | Discharge status: Home / Self Care | Payer: Medicaid Other | Source: Home / Self Care | Attending: Emergency Medicine | Admitting: Emergency Medicine

## 2015-07-13 ENCOUNTER — Inpatient Hospital Stay (HOSPITAL_COMMUNITY)
Admission: AD | Admit: 2015-07-13 | Discharge: 2015-07-13 | Disposition: A | Discharge status: Home / Self Care | Payer: Medicaid Other | Source: Ambulatory Visit | Attending: Family Medicine | Admitting: Family Medicine

## 2015-07-13 ENCOUNTER — Telehealth: Payer: Self-pay | Admitting: *Deleted

## 2015-07-13 ENCOUNTER — Inpatient Hospital Stay (HOSPITAL_COMMUNITY): Payer: Medicaid Other

## 2015-07-13 ENCOUNTER — Encounter (HOSPITAL_COMMUNITY): Payer: Self-pay | Admitting: Emergency Medicine

## 2015-07-13 ENCOUNTER — Encounter (HOSPITAL_COMMUNITY): Payer: Self-pay | Admitting: *Deleted

## 2015-07-13 DIAGNOSIS — Z3A17 17 weeks gestation of pregnancy: Secondary | ICD-10-CM | POA: Diagnosis not present

## 2015-07-13 DIAGNOSIS — S6992XA Unspecified injury of left wrist, hand and finger(s), initial encounter: Secondary | ICD-10-CM | POA: Diagnosis not present

## 2015-07-13 DIAGNOSIS — W182XXA Fall in (into) shower or empty bathtub, initial encounter: Secondary | ICD-10-CM

## 2015-07-13 DIAGNOSIS — O9A212 Injury, poisoning and certain other consequences of external causes complicating pregnancy, second trimester: Secondary | ICD-10-CM | POA: Diagnosis not present

## 2015-07-13 DIAGNOSIS — R109 Unspecified abdominal pain: Secondary | ICD-10-CM | POA: Diagnosis not present

## 2015-07-13 DIAGNOSIS — O26892 Other specified pregnancy related conditions, second trimester: Secondary | ICD-10-CM | POA: Insufficient documentation

## 2015-07-13 DIAGNOSIS — M25532 Pain in left wrist: Secondary | ICD-10-CM | POA: Insufficient documentation

## 2015-07-13 DIAGNOSIS — J45909 Unspecified asthma, uncomplicated: Secondary | ICD-10-CM | POA: Insufficient documentation

## 2015-07-13 DIAGNOSIS — W010XXA Fall on same level from slipping, tripping and stumbling without subsequent striking against object, initial encounter: Secondary | ICD-10-CM | POA: Diagnosis not present

## 2015-07-13 DIAGNOSIS — S3991XA Unspecified injury of abdomen, initial encounter: Secondary | ICD-10-CM | POA: Diagnosis not present

## 2015-07-13 DIAGNOSIS — O9989 Other specified diseases and conditions complicating pregnancy, childbirth and the puerperium: Secondary | ICD-10-CM | POA: Diagnosis not present

## 2015-07-13 DIAGNOSIS — W182XXS Fall in (into) shower or empty bathtub, sequela: Secondary | ICD-10-CM

## 2015-07-13 DIAGNOSIS — Z87891 Personal history of nicotine dependence: Secondary | ICD-10-CM | POA: Diagnosis not present

## 2015-07-13 LAB — URINALYSIS, ROUTINE W REFLEX MICROSCOPIC
Bilirubin Urine: NEGATIVE
Glucose, UA: NEGATIVE mg/dL
Ketones, ur: NEGATIVE mg/dL
Nitrite: NEGATIVE
Protein, ur: NEGATIVE mg/dL
Specific Gravity, Urine: 1.015 (ref 1.005–1.030)
pH: 6.5 (ref 5.0–8.0)

## 2015-07-13 LAB — URINE MICROSCOPIC-ADD ON: BACTERIA UA: NONE SEEN

## 2015-07-13 MED ORDER — ACETAMINOPHEN 325 MG PO TABS: 650.0000 mg | ORAL_TABLET | Freq: Once | ORAL | Status: DC

## 2015-07-13 NOTE — MAU Provider Note (Signed)
History     CSN: [id number]  Arrival date and time: 07/13/15 1207   First Provider Initiated Contact with Patient 07/13/15 1237        Chief Complaint  Patient presents with  . Fall  . Abdominal Pain  . Wrist Pain   HPI Comments: Valerie Huber is a 30 y.o. G3P1011 at [redacted]w[redacted]d who presents s/p fall. States slipped on "suds" while getting out of the shower; grabbed onto the shower curtain & fell outside of the tub landing on her abdomen & wrist. Reports left wrist pain & mid to RLQ abdominal pain since the fall. Fall occurred around 11 am this morning. Denies vaginal bleeding or LOF. Denies LOC.  Fall The accident occurred 1 to 3 hours ago. The fall occurred while standing (slipped in bathtub after shower). She landed on hard floor. There was no blood loss. The point of impact was the left wrist (& abdomen). The pain is present in the left wrist (& abdomen). The pain is at a severity of 5/10. The symptoms are aggravated by pressure on injury. Associated symptoms include abdominal pain. Pertinent negatives include no headaches or loss of consciousness. She has tried nothing for the symptoms.    OB History    Gravida Para Term Preterm AB TAB SAB Ectopic Multiple Living   3 1 1  1 1    1       Past Medical History  Diagnosis Date  . Asthma   . Vaginal Pap smear, abnormal   . Endometriosis   . Seizures ([place])   . Pelvic pain in female 12/31/2014  . Second degree uterine prolapse 12/31/2014  . Irregular periods 12/31/2014  . Pregnant 04/14/2015    Past Surgical History  Procedure Laterality Date  . Surgery for endometriosis  2005  . Leep    . Esophagogastroduodenoscopy N/A 12/10/2014    SLF: hematoemesis due to moderate erosive gastritis, duoedentitis, and duodenal ulcer    Family History  Problem Relation Age of Onset  . Cancer Mother     kidney   . Asthma Mother   . Hypertension Mother   . Colon cancer Neg Hx   . Cancer Paternal Grandmother     Social History  Substance  Use Topics  . Smoking status: Former Smoker -- 0.25 packs/day for 14 years    Types: Cigarettes  . Smokeless tobacco: Never Used     Comment: QUIT SMOKING ON JULY 11th. STATES WOULD ONLY HAVE A FEW CIGARETTES A WEEK PRIOR TO THIS.   [name] Alcohol Use: No     Comment: occ.    Allergies:  Allergies  Allergen Reactions  . Flexeril [Cyclobenzaprine] Hives  . Ibuprofen Other (See Comments)    "If I take 2-3 during the day it causes my stomach to become upset"    Prescriptions prior to admission  Medication Sig Dispense Refill Last Dose  . EPINEPHrine (EPI-PEN) 0.3 mg/0.3 mL SOAJ injection Inject 0.3 mg into the muscle once. Reported on 06/07/2015   Not Taking  . prenatal vitamin w/FE, FA (PRENATAL 1 + 1) 27-1 MG TABS tablet Take 1 tablet by mouth daily at 12 noon. 30 each 11 Taking    Review of Systems  Constitutional: Negative.   Gastrointestinal: Positive for abdominal pain.  Genitourinary: Negative.   Musculoskeletal: Positive for falls. Negative for back pain and neck pain.  Neurological: Negative for loss of consciousness and headaches.   Physical Exam   Blood pressure 114/65, pulse 78, temperature 99.5 F (37.5 C),  temperature source Oral, resp. rate 18, last menstrual period 02/12/2015.  Physical Exam  Nursing note and vitals reviewed. Constitutional: She is oriented to person, place, and time. She appears well-developed and well-nourished. No distress.  HENT:  Head: Normocephalic and atraumatic.  Eyes: Conjunctivae are normal. Right eye exhibits no discharge. Left eye exhibits no discharge. No scleral icterus.  Neck: Normal range of motion.  Cardiovascular: Normal rate, regular rhythm and normal heart sounds.   No murmur heard. Respiratory: Effort normal and breath sounds normal. No respiratory distress. She has no wheezes.  GI: Soft. Bowel sounds are normal. There is tenderness in the right lower quadrant. There is no rigidity and no guarding.    Musculoskeletal:        Left wrist: She exhibits tenderness (@ ulnar aspect of wrist). She exhibits normal range of motion, no swelling, no deformity and no laceration.  Neurological: She is alert and oriented to person, place, and time.  Skin: Skin is warm and dry. She is not diaphoretic.  Psychiatric: She has a normal mood and affect. Her behavior is normal. Judgment and thought content normal.    MAU Course  Procedures Results for orders placed or performed during the hospital encounter of 07/13/15 (from the past 24 hour(s))  Urinalysis, Routine w reflex microscopic (not at [facility])     Status: Abnormal   Collection Time: 07/13/15 12:15 PM  Result Value Ref Range   Color, Urine YELLOW YELLOW   APPearance CLEAR CLEAR   Specific Gravity, Urine 1.015 1.005 - 1.030   pH 6.5 5.0 - 8.0   Glucose, UA NEGATIVE NEGATIVE mg/dL   Hgb urine dipstick TRACE (A) NEGATIVE   Bilirubin Urine NEGATIVE NEGATIVE   Ketones, ur NEGATIVE NEGATIVE mg/dL   Protein, ur NEGATIVE NEGATIVE mg/dL   Nitrite NEGATIVE NEGATIVE   Leukocytes, UA TRACE (A) NEGATIVE  Urine microscopic-add on     Status: Abnormal   Collection Time: 07/13/15 12:15 PM  Result Value Ref Range   Squamous Epithelial / LPF 0-5 (A) NONE SEEN   WBC, UA 0-5 0 - 5 WBC/hpf   RBC / HPF 0-5 0 - 5 RBC/hpf   Bacteria, UA NONE SEEN NONE SEEN   Urine-Other MUCOUS PRESENT    Dg Wrist Complete Left  07/13/2015  CLINICAL DATA:  [place] this morning on wrist. Wrist pain and swelling. Initial encounter. EXAM: LEFT WRIST - COMPLETE 3+ VIEW COMPARISON:  None. FINDINGS: There is no evidence of fracture or dislocation. There is no evidence of arthropathy or other focal bone abnormality. Soft tissues are unremarkable. IMPRESSION: Negative. Electronically Signed   By: Earle Gell M.D.   On: 07/13/2015 13:20    MDM FHT 146 by doppler Xray of wrist normal Tylenol 650 mg po Ultrasound normal  Assessment and Plan  A:  1. Fall in (into) shower or empty bathtub, sequela   2. [redacted] weeks  gestation of pregnancy   3. Abdominal trauma, initial encounter   4. Acute wrist pain, left     P: Discharge home Discussed reasons to return to MAU Apply otc antibiotic ointment prn to abrasion Take otc tylenol per package instructions Keep schedule f/u with ob  Jorje Guild 07/13/2015, 12:37 PM

## 2015-07-13 NOTE — Telephone Encounter (Signed)
Pt states she is at [facility] ER because she fell getting out of the bathroom on her stomach. Per Nigel Berthold, CNM, Pt needs to go to [facility] for 4 hour monitoring. Pt verbalized understanding.

## 2015-07-13 NOTE — Discharge Instructions (Signed)
Apply antibiotic ointment to abrasion on abdomen.  Return to MAU immediately for severe abdominal pain and/or vaginal bleeding    What Do I Need to Know About Injuries During Pregnancy? Trauma is the most common cause of injury and death in pregnant women. This can also result in significant harm or death of the baby. Your baby is protected in the womb (uterus) by a sac filled with fluid (amniotic sac). Your baby can be harmed if there is direct, high-impact trauma to your abdomen and pelvis. This type of trauma can result in tearing of your uterus, the placenta pulling away from the wall of the uterus (placenta abruption), or the amniotic sac breaking open (rupture of membranes). These injuries can decrease or stop the blood supply to your baby or cause you to go into labor earlier than expected. Minor falls and low-impact automobile accidents do not usually harm your baby, even if they do minimally harm you. WHAT KIND OF INJURIES CAN AFFECT MY PREGNANCY? The most common causes of injury or death to a baby include:  Falls. Falls are more common in the second and third trimester of the pregnancy. Factors that increase your risk of falling include:  Increase in your weight.  The change in your center of gravity.  Tripping over an object that cannot be seen.  Increased looseness (laxity) of your ligaments resulting in less coordinated movements (you may feel clumsy).  Falling during high-risk activities like horseback riding or skiing.  Automobile accidents. It is important to wear your seat belt properly, with the lap belt below your abdomen, and always practice safe driving.  Domestic violence or assault.  Burns (Building services engineer). The most common causes of injury or death to the pregnant woman include:  Injuries that cause severe bleeding, shock, and loss of blood flow to major organs.  Head and neck injuries that result in severe brain or spinal damage.  Chest trauma that can  cause direct injury to the heart and lungs or any injury that affects the area enclosed by the ribs. Trauma to this area can result in cardiorespiratory arrest. WHAT CAN I DO TO PROTECT MYSELF AND MY BABY FROM INJURY WHILE I AM PREGNANT?  Remove slippery rugs and loose objects on the floor that increase your risk of tripping.  Avoid walking on wet or slippery floors.  Wear comfortable shoes that have a good grip on the sole. Do not wear high-heeled shoes.  Always wear your seat belt properly, with the lap belt below your abdomen, and always practice safe driving. Do not ride on a motorcycle while pregnant.  Do not participate in high-impact activities or sports.  Avoid fires, starting fires, lifting heavy pots of boiling or hot liquids, and fixing electrical problems.  Only take over-the-counter or prescription medicines for pain, fever, or discomfort as directed by your health care provider.  Know your blood type and the father's blood type in case you develop vaginal bleeding or experience an injury for which a blood transfusion may be necessary.  Call your local emergency services (911 in the [place] if you are a victim of domestic violence or assault. Spousal abuse can be a significant cause of trauma during pregnancy. For help and support, contact the [employer]. WHEN SHOULD I SEEK IMMEDIATE MEDICAL CARE?   You fall on your abdomen or experience any high-force accident or injury.  You have been assaulted (domestic or otherwise).  You have been in a car accident.  You develop  vaginal bleeding.  You develop fluid leaking from the vagina.  You develop uterine contractions (pelvic cramping, pain, or significant low back pain).  You become weak or faint, or have uncontrolled vomiting after trauma.  You had a serious burn. This includes burns to the face, neck, hands, or genitals, or burns greater than the size of your palm anywhere else.  You develop neck  stiffness or pain after a fall or from other trauma.  You develop a headache or vision problems after a fall or from other trauma.  You do not feel the baby moving or the baby is not moving as much as before a fall or other trauma.   This information is not intended to replace advice given to you by your health care provider. Make sure you discuss any questions you have with your health care provider.   Document Released: 06/07/2004 Document Revised: 05/21/2014 Document Reviewed: 02/04/2013 Elsevier Interactive Patient Education [employer].

## 2015-07-13 NOTE — ED Notes (Signed)
[place] getting out of shower at 1045.  Pt is 4 months pregnant.  Injury to abdomen and left wrist.  C/o burning to abdomen.

## 2015-07-13 NOTE — MAU Note (Signed)
Pt presents to MAU with complaints of falling in the shower and landing on her abdomen and left wrist. Denies any vaginal bleeding or abnormal discharge

## 2015-07-13 NOTE — ED Notes (Signed)
Pt not in room at this time.  Gown lying on bed.

## 2015-07-26 ENCOUNTER — Ambulatory Visit (INDEPENDENT_AMBULATORY_CARE_PROVIDER_SITE_OTHER): Payer: Medicaid Other | Admitting: Women's Health

## 2015-07-26 ENCOUNTER — Encounter: Payer: Self-pay | Admitting: Women's Health

## 2015-07-26 ENCOUNTER — Ambulatory Visit (INDEPENDENT_AMBULATORY_CARE_PROVIDER_SITE_OTHER): Payer: Medicaid Other

## 2015-07-26 VITALS — BP 110/58 | HR 64 | Wt 166.0 lb

## 2015-07-26 DIAGNOSIS — O99322 Drug use complicating pregnancy, second trimester: Secondary | ICD-10-CM | POA: Diagnosis not present

## 2015-07-26 DIAGNOSIS — Z36 Encounter for antenatal screening of mother: Secondary | ICD-10-CM

## 2015-07-26 DIAGNOSIS — Z1389 Encounter for screening for other disorder: Secondary | ICD-10-CM

## 2015-07-26 DIAGNOSIS — L292 Pruritus vulvae: Secondary | ICD-10-CM | POA: Diagnosis not present

## 2015-07-26 DIAGNOSIS — B9689 Other specified bacterial agents as the cause of diseases classified elsewhere: Secondary | ICD-10-CM

## 2015-07-26 DIAGNOSIS — F1290 Cannabis use, unspecified, uncomplicated: Secondary | ICD-10-CM

## 2015-07-26 DIAGNOSIS — N760 Acute vaginitis: Secondary | ICD-10-CM | POA: Diagnosis not present

## 2015-07-26 DIAGNOSIS — Z331 Pregnant state, incidental: Secondary | ICD-10-CM | POA: Diagnosis not present

## 2015-07-26 DIAGNOSIS — Z3492 Encounter for supervision of normal pregnancy, unspecified, second trimester: Secondary | ICD-10-CM

## 2015-07-26 DIAGNOSIS — Z363 Encounter for antenatal screening for malformations: Secondary | ICD-10-CM

## 2015-07-26 LAB — POCT WET PREP (WET MOUNT): Clue Cells Wet Prep Whiff POC: POSITIVE

## 2015-07-26 MED ORDER — METRONIDAZOLE 500 MG PO TABS: 500.0000 mg | ORAL_TABLET | Freq: Two times a day (BID) | ORAL | Status: DC

## 2015-07-26 NOTE — Patient Instructions (Addendum)
[place] Pediatricians/Family Doctors:  [facility] [phone]                 [facility] [phone] (usually not accepting new patients unless you have family there already, you are always welcome to call and ask)            [facility] ([place]) [phone]   [facility] Pediatricians/Family Doctors:   [place]: [phone]  [facility]: [phone]   Second Trimester of Pregnancy The second trimester is from week 13 through week 28, months 4 through 6. The second trimester is often a time when you feel your best. Your body has also adjusted to being pregnant, and you begin to feel better physically. Usually, morning sickness has lessened or quit completely, you may have more energy, and you may have an increase in appetite. The second trimester is also a time when the fetus is growing rapidly. At the end of the sixth month, the fetus is about 9 inches long and weighs about 1 pounds. You will likely begin to feel the baby move (quickening) between 18 and 20 weeks of the pregnancy. BODY CHANGES Your body goes through many changes during pregnancy. The changes vary from woman to woman.  2. Your weight will continue to increase. You will notice your lower abdomen bulging out. 3. You may begin to get stretch marks on your hips, abdomen, and breasts. 4. You may develop headaches that can be relieved by medicines approved by your health care provider. 5. You may urinate more often because the fetus is pressing on your bladder. 6. You may develop or continue to have heartburn as a result of your pregnancy. 7. You may develop constipation because certain hormones are causing the muscles that push waste through your intestines to slow down. 8. You may develop hemorrhoids or swollen, bulging veins (varicose veins). 9. You may have back pain because of the  weight gain and pregnancy hormones relaxing your joints between the bones in your pelvis and as a result of a shift in weight and the muscles that support your balance. 10. Your breasts will continue to grow and be tender. 11. Your gums may bleed and may be sensitive to brushing and flossing. 12. Dark spots or blotches (chloasma, mask of pregnancy) may develop on your face. This will likely fade after the baby is born. 13. A dark line from your belly button to the pubic area (linea nigra) may appear. This will likely fade after the baby is born. 14. You may have changes in your hair. These can include thickening of your hair, rapid growth, and changes in texture. Some women also have hair loss during or after pregnancy, or hair that feels dry or thin. Your hair will most likely return to normal after your baby is born. WHAT TO EXPECT AT YOUR PRENATAL VISITS During a routine prenatal visit: 2. You will be weighed to make sure you and the fetus are growing normally. 3. Your blood pressure will be taken. 4. Your abdomen will be measured to track your baby's growth. 5. The fetal heartbeat will be listened to. 6. Any test results from the previous visit will be discussed. Your health care provider may ask you: 2. How you are feeling. 3. If you are feeling the baby move. 4. If you have had any abnormal symptoms, such as leaking fluid, bleeding, severe  headaches, or abdominal cramping. 5. If you are using any tobacco products, including cigarettes, chewing tobacco, and electronic cigarettes. 6. If you have any questions. Other tests that may be performed during your second trimester include: 2. Blood tests that check for: 1. Low iron levels (anemia). 2. Gestational diabetes (between 24 and 28 weeks). 3. Rh antibodies. 3. Urine tests to check for infections, diabetes, or protein in the urine. 4. An ultrasound to confirm the proper growth and development of the baby. 5. An amniocentesis to check for  possible genetic problems. 6. Fetal screens for spina bifida and Down syndrome. 7. HIV (human immunodeficiency virus) testing. Routine prenatal testing includes screening for HIV, unless you choose not to have this test. HOME CARE INSTRUCTIONS  3. Avoid all smoking, herbs, alcohol, and unprescribed drugs. These chemicals affect the formation and growth of the baby. 4. Do not use any tobacco products, including cigarettes, chewing tobacco, and electronic cigarettes. If you need help quitting, ask your health care provider. You may receive counseling support and other resources to help you quit. 5. Follow your health care provider's instructions regarding medicine use. There are medicines that are either safe or unsafe to take during pregnancy. 6. Exercise only as directed by your health care provider. Experiencing uterine cramps is a good sign to stop exercising. 7. Continue to eat regular, healthy meals. 8. Wear a good support bra for breast tenderness. 9. Do not use hot tubs, steam rooms, or saunas. 10. Wear your seat belt at all times when driving. 11. Avoid raw meat, uncooked cheese, cat litter boxes, and soil used by cats. These carry germs that can cause birth defects in the baby. 12. Take your prenatal vitamins. 13. Take 1500-2000 mg of calcium daily starting at the 20th week of pregnancy until you deliver your baby. 14. Try taking a stool softener (if your health care provider approves) if you develop constipation. Eat more high-fiber foods, such as fresh vegetables or fruit and whole grains. Drink plenty of fluids to keep your urine clear or pale yellow. 15. Take warm sitz baths to soothe any pain or discomfort caused by hemorrhoids. Use hemorrhoid cream if your health care provider approves. 16. If you develop varicose veins, wear support hose. Elevate your feet for 15 minutes, 3-4 times a day. Limit salt in your diet. 17. Avoid heavy lifting, wear low heel shoes, and practice good  posture. 18. Rest with your legs elevated if you have leg cramps or low back pain. 19. Visit your dentist if you have not gone yet during your pregnancy. Use a soft toothbrush to brush your teeth and be gentle when you floss. 20. A sexual relationship may be continued unless your health care provider directs you otherwise. 21. Continue to go to all your prenatal visits as directed by your health care provider. SEEK MEDICAL CARE IF:   You have dizziness.  You have mild pelvic cramps, pelvic pressure, or nagging pain in the abdominal area.  You have persistent nausea, vomiting, or diarrhea.  You have a bad smelling vaginal discharge.  You have pain with urination. SEEK IMMEDIATE MEDICAL CARE IF:   You have a fever.  You are leaking fluid from your vagina.  You have spotting or bleeding from your vagina.  You have severe abdominal cramping or pain.  You have rapid weight gain or loss.  You have shortness of breath with chest pain.  You notice sudden or extreme swelling of your face, hands, ankles, feet, or legs.  You have not felt your baby move in over an hour.  You have severe headaches that do not go away with medicine.  You have vision changes.   This information is not intended to replace advice given to you by your health care provider. Make sure you discuss any questions you have with your health care provider.   Document Released: 04/24/2001 Document Revised: 05/21/2014 Document Reviewed: 07/01/2012 Elsevier Interactive Patient Education 2016 Elsevier Inc.  Bacterial Vaginosis Bacterial vaginosis is a vaginal infection that occurs when the normal balance of bacteria in the vagina is disrupted. It results from an overgrowth of certain bacteria. This is the most common vaginal infection in women of childbearing age. Treatment is important to prevent complications, especially in pregnant women, as it can cause a premature delivery. CAUSES  Bacterial vaginosis is  caused by an increase in harmful bacteria that are normally present in smaller amounts in the vagina. Several different kinds of bacteria can cause bacterial vaginosis. However, the reason that the condition develops is not fully understood. RISK FACTORS Certain activities or behaviors can put you at an increased risk of developing bacterial vaginosis, including: 15. Having a new sex partner or multiple sex partners. 16. Douching. 17. Using an intrauterine device (IUD) for contraception. Women do not get bacterial vaginosis from toilet seats, bedding, swimming pools, or contact with objects around them. SIGNS AND SYMPTOMS  Some women with bacterial vaginosis have no signs or symptoms. Common symptoms include: 7. Grey vaginal discharge. 8. A fishlike odor with discharge, especially after sexual intercourse. 9. Itching or burning of the vagina and vulva. 10. Burning or pain with urination. DIAGNOSIS  Your health care provider will take a medical history and examine the vagina for signs of bacterial vaginosis. A sample of vaginal fluid may be taken. Your health care provider will look at this sample under a microscope to check for bacteria and abnormal cells. A vaginal pH test may also be done.  TREATMENT  Bacterial vaginosis may be treated with antibiotic medicines. These may be given in the form of a pill or a vaginal cream. A second round of antibiotics may be prescribed if the condition comes back after treatment. Because bacterial vaginosis increases your risk for sexually transmitted diseases, getting treated can help reduce your risk for chlamydia, gonorrhea, HIV, and herpes. HOME CARE INSTRUCTIONS  7. Only take over-the-counter or prescription medicines as directed by your health care provider. 8. If antibiotic medicine was prescribed, take it as directed. Make sure you finish it even if you start to feel better. 9. Tell all sexual partners that you have a vaginal infection. They should see  their health care provider and be treated if they have problems, such as a mild rash or itching. 10. During treatment, it is important that you follow these instructions: 1. Avoid sexual activity or use condoms correctly. 2. Do not douche. 3. Avoid alcohol as directed by your health care provider. 4. Avoid breastfeeding as directed by your health care provider. SEEK MEDICAL CARE IF:  8. Your symptoms are not improving after 3 days of treatment. 9. You have increased discharge or pain. 10. You have a fever. MAKE SURE YOU:  22. Understand these instructions. 23. Will watch your condition. 24. Will get help right away if you are not doing well or get worse. FOR MORE INFORMATION  Centers for Disease Control and Prevention, Division of STD Prevention: Appraiser[employer]raud[employer] American Sexual Health Association (ASHA): www.ashastd.org    This information is not intended  to replace advice given to you by your health care provider. Make sure you discuss any questions you have with your health care provider.   Document Released: 04/30/2005 Document Revised: 05/21/2014 Document Reviewed: 12/10/2012 Elsevier Interactive Patient Education [employer].

## 2015-07-26 NOTE — Addendum Note (Signed)
Addended by: Traci Sermon A on: 07/26/2015 03:04 PM   Modules accepted: Orders

## 2015-07-26 NOTE — Progress Notes (Signed)
Low-risk OB appointment G3P1011 [redacted]w[redacted]d Estimated Date of Delivery: 12/15/15 BP 110/58 mmHg  Pulse 64  Wt 166 lb (75.297 kg)  LMP 02/12/2015 (Approximate)  BP, weight.  Unable to void, will try again before she leaves. Refer to obstetrical flow sheet for FH & FHR.  Reports good fm.  Denies regular uc's, lof, vb, or uti s/s. Vulvar itching x few days, no odor/discharge. Wants STD screen.  Spec exam: cx visually closed, mod amt thin white malodorous d/c, wet prep many clues c/w BV. Rx metronidazole 500mg  BID x 7d for BV, no sex or etoh while taking. Will also check gc/ct per pt request. No THC in 4mths- will recheck UDS today.  Reviewed today's normal anatomy u/s, warning s/s to report, fm. Plan:  Continue routine obstetrical care  F/U in 4wks for OB appointment

## 2015-07-26 NOTE — Progress Notes (Signed)
[place] 19+5 wks,breech,ant pl gr 0,cx 4.6 cm,normal ov's bilat,svp of fluid 5 cm,efw 364 g,fhr 150 bpm, anatomy complete no obvious abnormalities seen

## 2015-07-27 ENCOUNTER — Telehealth: Payer: Self-pay | Admitting: Obstetrics & Gynecology

## 2015-07-27 NOTE — Telephone Encounter (Signed)
Pt states got sick, N/V, dizziness after taking the Metronidazole. Pt informed to eat something before taking the Metronidazole, push fluids. Pt verbalized understanding.

## 2015-07-27 NOTE — Telephone Encounter (Signed)
Pt called stating that she would like a call back from  A nurse regarding a medication that is making her throw up. Please contact

## 2015-08-23 ENCOUNTER — Encounter: Payer: Medicaid Other | Admitting: Obstetrics & Gynecology

## 2015-09-16 ENCOUNTER — Ambulatory Visit (INDEPENDENT_AMBULATORY_CARE_PROVIDER_SITE_OTHER): Payer: Medicaid Other | Admitting: Obstetrics & Gynecology

## 2015-09-16 ENCOUNTER — Encounter: Payer: Self-pay | Admitting: Obstetrics & Gynecology

## 2015-09-16 VITALS — BP 100/60 | HR 76 | Wt 166.4 lb

## 2015-09-16 DIAGNOSIS — B9689 Other specified bacterial agents as the cause of diseases classified elsewhere: Secondary | ICD-10-CM

## 2015-09-16 DIAGNOSIS — Z3492 Encounter for supervision of normal pregnancy, unspecified, second trimester: Secondary | ICD-10-CM

## 2015-09-16 DIAGNOSIS — N760 Acute vaginitis: Secondary | ICD-10-CM

## 2015-09-16 DIAGNOSIS — A499 Bacterial infection, unspecified: Secondary | ICD-10-CM | POA: Diagnosis not present

## 2015-09-16 DIAGNOSIS — Z331 Pregnant state, incidental: Secondary | ICD-10-CM

## 2015-09-16 MED ORDER — METRONIDAZOLE 500 MG PO TABS: 500.0000 mg | ORAL_TABLET | Freq: Two times a day (BID) | ORAL | Status: DC

## 2015-09-16 NOTE — Progress Notes (Signed)
Pt has not been seen since 07/26/2015  G3P1011 [redacted]w[redacted]d Estimated Date of Delivery: 12/15/15  Blood pressure 100/60, pulse 76, weight 166 lb 6.4 oz (75.479 kg), last menstrual period 02/12/2015.   BP weight and urine results all reviewed and noted.  Please refer to the obstetrical flow sheet for the fundal height and fetal heart rate documentation:  Patient reports good fetal movement, denies any bleeding and no rupture of membranes symptoms or regular contractions. Patient is complaining of a malodorous vaginal discharge Was treated for BV 3/14, pt states she took her meds  Wet Prep:   A sample of vaginal discharge was obtained from the posterior fornix using a cotton swab. 2 drops of saline were placed on a slide and the cotton swab was immersed in the saline. Microscopic evaluation was performed and results were as follows:  Negative  for yeast  Positive for clue cells , consistent with Bacterial vaginosis Negative for trichomonas  Abnormal- heavy WBC population   Whiff test: Positive  All questions were answered.  No orders of the defined types were placed in this encounter.    Plan:  Continued routine obstetrical care,     Return in about 1 week (around 09/23/2015) for PN2, , LROB.   Meds ordered this encounter  Medications  . metroNIDAZOLE (FLAGYL) 500 MG tablet    Sig: Take 1 tablet (500 mg total) by mouth 2 (two) times daily.    Dispense:  14 tablet    Refill:  0

## 2015-09-23 ENCOUNTER — Other Ambulatory Visit: Payer: Medicaid Other

## 2015-09-23 ENCOUNTER — Encounter: Payer: Medicaid Other | Admitting: Obstetrics & Gynecology

## 2015-09-23 ENCOUNTER — Encounter: Payer: Self-pay | Admitting: Obstetrics & Gynecology

## 2015-10-20 ENCOUNTER — Other Ambulatory Visit: Payer: Medicaid Other

## 2015-10-21 LAB — GLUCOSE TOLERANCE, 2 HOURS W/ 1HR
GLUCOSE, 1 HOUR: 170 mg/dL (ref 65–179)
Glucose, 2 hour: 140 mg/dL (ref 65–152)
Glucose, Fasting: 82 mg/dL (ref 65–91)

## 2015-10-21 LAB — CBC
Hematocrit: 31.3 % — ABNORMAL LOW (ref 34.0–46.6)
Hemoglobin: 10.4 g/dL — ABNORMAL LOW (ref 11.1–15.9)
MCH: 30.9 pg (ref 26.6–33.0)
MCHC: 33.2 g/dL (ref 31.5–35.7)
MCV: 93 fL (ref 79–97)
PLATELETS: 217 10*3/uL (ref 150–379)
RBC: 3.37 x10E6/uL — AB (ref 3.77–5.28)
RDW: 14 % (ref 12.3–15.4)
WBC: 5.6 10*3/uL (ref 3.4–10.8)

## 2015-10-21 LAB — RPR: RPR: NONREACTIVE

## 2015-10-21 LAB — HIV ANTIBODY (ROUTINE TESTING W REFLEX): HIV Screen 4th Generation wRfx: NONREACTIVE

## 2015-10-21 LAB — ANTIBODY SCREEN: ANTIBODY SCREEN: NEGATIVE

## 2015-10-25 ENCOUNTER — Ambulatory Visit (INDEPENDENT_AMBULATORY_CARE_PROVIDER_SITE_OTHER): Payer: Medicaid Other | Admitting: Advanced Practice Midwife

## 2015-10-25 VITALS — BP 98/68 | HR 94 | Wt 171.0 lb

## 2015-10-25 DIAGNOSIS — Z3493 Encounter for supervision of normal pregnancy, unspecified, third trimester: Secondary | ICD-10-CM

## 2015-10-25 DIAGNOSIS — F1210 Cannabis abuse, uncomplicated: Secondary | ICD-10-CM

## 2015-10-25 DIAGNOSIS — Z1389 Encounter for screening for other disorder: Secondary | ICD-10-CM

## 2015-10-25 DIAGNOSIS — Z331 Pregnant state, incidental: Secondary | ICD-10-CM

## 2015-10-25 DIAGNOSIS — O0933 Supervision of pregnancy with insufficient antenatal care, third trimester: Secondary | ICD-10-CM

## 2015-10-25 DIAGNOSIS — O0930 Supervision of pregnancy with insufficient antenatal care, unspecified trimester: Secondary | ICD-10-CM | POA: Insufficient documentation

## 2015-10-25 DIAGNOSIS — F1290 Cannabis use, unspecified, uncomplicated: Secondary | ICD-10-CM

## 2015-10-25 LAB — POCT URINALYSIS DIPSTICK
Glucose, UA: NEGATIVE
Ketones, UA: NEGATIVE
LEUKOCYTES UA: NEGATIVE
NITRITE UA: NEGATIVE
RBC UA: NEGATIVE

## 2015-10-25 NOTE — Progress Notes (Signed)
[id number] [redacted]w[redacted]d Estimated Date of Delivery: 12/15/15  Blood pressure 98/68, pulse 94, weight 171 lb (77.565 kg), last menstrual period 02/12/2015.   BP weight and urine results all reviewed and noted.  Please refer to the obstetrical flow sheet for the fundal height and fetal heart rate documentation:  Patient reports good fetal movement, denies any bleeding and no rupture of membranes symptoms or regular contractions. Patient is without complaints other than normal pregnancy complaints.  Wants to be induced ASAP.  All questions were answered.  Orders Placed This Encounter  Procedures  . Pain Management Screening Profile (10S)  . POCT urinalysis dipstick    Plan:  Continued routine obstetrical care,   Return in about 2 weeks (around 11/08/2015) for LROB.

## 2015-10-25 NOTE — Addendum Note (Signed)
Addended by: Diona Fanti A on: 10/25/2015 05:15 PM   Modules accepted: Orders

## 2015-10-25 NOTE — Addendum Note (Signed)
Addended by: Diona Fanti A on: 10/25/2015 05:11 PM   Modules accepted: Orders

## 2015-10-26 LAB — PMP SCREEN PROFILE (10S), URINE
AMPHETAMINE SCRN UR: NEGATIVE ng/mL
Barbiturate Screen, Ur: NEGATIVE ng/mL
Benzodiazepine Screen, Urine: NEGATIVE ng/mL
CANNABINOIDS UR QL SCN: POSITIVE ng/mL
COCAINE(METAB.) SCREEN, URINE: NEGATIVE ng/mL
Creatinine(Crt), U: 197 mg/dL (ref 20.0–300.0)
METHADONE SCREEN, URINE: NEGATIVE ng/mL
OXYCODONE+OXYMORPHONE UR QL SCN: NEGATIVE ng/mL
Opiate Scrn, Ur: NEGATIVE ng/mL
PCP SCRN UR: NEGATIVE ng/mL
PH UR, DRUG SCRN: 6.3 (ref 4.5–8.9)
PROPOXYPHENE SCREEN: NEGATIVE ng/mL

## 2015-10-27 ENCOUNTER — Telehealth: Payer: Self-pay | Admitting: Advanced Practice Midwife

## 2015-10-27 NOTE — Telephone Encounter (Signed)
Pt walked into office today with c/o bruising one on the back of both legs (size of quarter). Pt states "woke up this am and they were there." Nigel Berthold, CNM visualized bruises, platelet count 217 on 10/20/2015, pt reassured nothing to be concerned about at this time. Pt verbalized understanding.

## 2015-11-02 ENCOUNTER — Telehealth: Payer: Self-pay | Admitting: *Deleted

## 2015-11-02 ENCOUNTER — Ambulatory Visit (INDEPENDENT_AMBULATORY_CARE_PROVIDER_SITE_OTHER): Payer: Medicaid Other | Admitting: Obstetrics and Gynecology

## 2015-11-02 VITALS — BP 100/60 | HR 100 | Wt 171.0 lb

## 2015-11-02 DIAGNOSIS — Z1389 Encounter for screening for other disorder: Secondary | ICD-10-CM

## 2015-11-02 DIAGNOSIS — O368130 Decreased fetal movements, third trimester, not applicable or unspecified: Secondary | ICD-10-CM

## 2015-11-02 DIAGNOSIS — Z3A34 34 weeks gestation of pregnancy: Secondary | ICD-10-CM | POA: Diagnosis not present

## 2015-11-02 DIAGNOSIS — Z331 Pregnant state, incidental: Secondary | ICD-10-CM

## 2015-11-02 NOTE — Progress Notes (Signed)
Pt worked in today for decreased fetal movement. Pt states that she has not felt the baby move since 6:30 yesterday morning. Unable to void at this time.

## 2015-11-02 NOTE — Progress Notes (Signed)
Patient ID: Valerie Huber, female   DOB: Feb 19, 1986, 30 y.o.   MRN: [id number] Royetta Asal APPOINTMENT [id number] [redacted]w[redacted]d Estimated Date of Delivery: 12/15/15 [facility]  Patient reports decreased fetal movement, denies any bleeding and no rupture of membranes symptoms or regular contractions. Patient complaints: Pt WORKED IN for decreased fetal movement.  Weight 171 lb (77.565 kg), last menstrual period 02/12/2015.  refer to the ob flow sheet for FH and FHR, also BP, Wt, Urine results:notable for trace protein                          FH: 34 cm  FHR; 131 bpm                                            Questions were answered. Assessment: LROB G3P1011 @ [redacted]w[redacted]d   NST normal. Pt reassured.   Plan:  Continued routine obstetrical care,   F/u as scheduled for pnx care   By signing my name below, I, Stephania Fragmin, attest that this documentation has been prepared under the direction and in the presence of [name], MD. Electronically Signed: Stephania Fragmin, ED Scribe. 11/02/2015. 3:14 PM.  I personally performed the services described in this documentation, which was SCRIBED in my presence. The recorded information has been reviewed and considered accurate. It has been edited as necessary during review. [name], MD

## 2015-11-02 NOTE — Telephone Encounter (Signed)
Pt states no FM yesterday or today. Pt states she has ate and drank and still no movement. Pt given an appt today for evaluation.

## 2015-11-08 ENCOUNTER — Encounter: Payer: Medicaid Other | Admitting: Women's Health

## 2015-11-08 ENCOUNTER — Encounter (HOSPITAL_COMMUNITY): Payer: Self-pay

## 2015-11-08 ENCOUNTER — Inpatient Hospital Stay (HOSPITAL_COMMUNITY)
Admission: AD | Admit: 2015-11-08 | Discharge: 2015-11-08 | Disposition: A | Discharge status: Home / Self Care | Payer: Medicaid Other | Source: Ambulatory Visit | Attending: Family Medicine | Admitting: Family Medicine

## 2015-11-08 DIAGNOSIS — Z3A34 34 weeks gestation of pregnancy: Secondary | ICD-10-CM | POA: Diagnosis not present

## 2015-11-08 DIAGNOSIS — O99613 Diseases of the digestive system complicating pregnancy, third trimester: Secondary | ICD-10-CM | POA: Insufficient documentation

## 2015-11-08 DIAGNOSIS — J45909 Unspecified asthma, uncomplicated: Secondary | ICD-10-CM | POA: Diagnosis not present

## 2015-11-08 DIAGNOSIS — O26893 Other specified pregnancy related conditions, third trimester: Secondary | ICD-10-CM | POA: Diagnosis not present

## 2015-11-08 DIAGNOSIS — Z87891 Personal history of nicotine dependence: Secondary | ICD-10-CM | POA: Insufficient documentation

## 2015-11-08 DIAGNOSIS — O99513 Diseases of the respiratory system complicating pregnancy, third trimester: Secondary | ICD-10-CM | POA: Insufficient documentation

## 2015-11-08 DIAGNOSIS — N898 Other specified noninflammatory disorders of vagina: Secondary | ICD-10-CM | POA: Diagnosis not present

## 2015-11-08 DIAGNOSIS — K5900 Constipation, unspecified: Secondary | ICD-10-CM | POA: Diagnosis not present

## 2015-11-08 LAB — URINALYSIS, ROUTINE W REFLEX MICROSCOPIC
BILIRUBIN URINE: NEGATIVE
Glucose, UA: NEGATIVE mg/dL
Ketones, ur: 15 mg/dL — AB
Nitrite: NEGATIVE
PH: 7 (ref 5.0–8.0)
Protein, ur: NEGATIVE mg/dL
SPECIFIC GRAVITY, URINE: 1.01 (ref 1.005–1.030)

## 2015-11-08 LAB — URINE MICROSCOPIC-ADD ON

## 2015-11-08 NOTE — MAU Note (Signed)
Pt reports a lot of rectal pressure and a mucous discharge.

## 2015-11-08 NOTE — MAU Provider Note (Signed)
History     CSN: [id number]  Arrival date and time: 11/08/15 [id number]   First Provider Initiated Contact with Patient 11/08/15 2025      No chief complaint on file.  HPI  Pt is 288w5d G3P1011 who presents with ?LOF.  At 5:55pm pt took underwear off and noticed a lot of white milky dischare.  Pt put pantiliner on and had heavy clearish discharge .  Pt denies spotting or bleeding.  Baby is active. Pt had a bowel movement yesterday.  Pt has increase rectal pressure since 5 something feels like needs to have BM but cannot go.  Past Medical History  Diagnosis Date  . Asthma   . Vaginal Pap smear, abnormal   . Endometriosis   . Seizures (HCC)   . Pelvic pain in female 12/31/2014  . Second degree uterine prolapse 12/31/2014  . Irregular periods 12/31/2014  . Pregnant 04/14/2015    Past Surgical History  Procedure Laterality Date  . Surgery for endometriosis  2005  . Leep    . Esophagogastroduodenoscopy N/A 12/10/2014    SLF: hematoemesis due to moderate erosive gastritis, duoedentitis, and duodenal ulcer    Family History  Problem Relation Age of Onset  . Cancer Mother     kidney   . Asthma Mother   . Hypertension Mother   . Colon cancer Neg Hx   . Cancer Paternal Grandmother     Social History  Substance Use Topics  . Smoking status: Former Smoker -- 0.25 packs/day for 14 years    Types: Cigarettes  . Smokeless tobacco: Never Used     Comment: QUIT SMOKING ON JULY 11th. STATES WOULD ONLY HAVE A FEW CIGARETTES A WEEK PRIOR TO THIS.   [name]. Alcohol Use: No     Comment: occ.    Allergies:  Allergies  Allergen Reactions  . Flexeril [Cyclobenzaprine] Hives  . Ibuprofen Other (See Comments)    "If I take 2-3 during the day it causes my stomach to become upset"    Prescriptions prior to admission  Medication Sig Dispense Refill Last Dose  . EPINEPHrine (EPI-PEN) 0.3 mg/0.3 mL SOAJ injection Inject 0.3 mg into the muscle once. Reported on 09/16/2015   Taking  . metroNIDAZOLE  (FLAGYL) 500 MG tablet Take 1 tablet (500 mg total) by mouth 2 (two) times daily. (Patient not taking: Reported on 10/25/2015) 14 tablet 0 Not Taking  . prenatal vitamin w/FE, FA (PRENATAL 1 + 1) 27-1 MG TABS tablet Take 1 tablet by mouth daily at 12 noon. 30 each 11 Taking    Review of Systems  Constitutional: Negative for fever and chills.  Gastrointestinal: Negative for nausea, vomiting, abdominal pain, diarrhea and constipation.  Genitourinary: Negative for dysuria.  Neurological: Negative for dizziness and headaches.   Physical Exam   Blood pressure 117/71, pulse 98, temperature 98.8 F (37.1 C), temperature source Oral, resp. rate 16, height 5\' 4"  (1.626 m), last menstrual period 02/12/2015, SpO2 100 %.  Physical Exam  Vitals reviewed. Constitutional: She is oriented to person, place, and time. She appears well-developed and well-nourished. No distress.  HENT:  Head: Normocephalic.  Eyes: Pupils are equal, round, and reactive to light.  Neck: Normal range of motion. Neck supple.  Cardiovascular: Normal rate.   Respiratory: Effort normal.  GI: Soft. She exhibits distension. There is no tenderness. There is no rebound and no guarding.  Genitourinary:  Small amount of thick white discharge in vault; cervix closed-FT; long, soft Stool in rectum palpable  Musculoskeletal: Normal  range of motion.  Neurological: She is alert and oriented to person, place, and time.  Skin: Skin is warm and dry.  Psychiatric: She has a normal mood and affect.    MAU Course  Procedures Fern- neg NST reactive baeline 135 with 6-25 bpm variability; 15x15 accelerations; no ctx noted  Assessment and Plan  Vaginal discharge in pregnancy [redacted]w[redacted]d Neg Fern Reactive NST Keep OB appointment Return for any spotting/bleeding, LOF Kick counts Constipation- discussed options OTC remedies Valerie Huber 11/08/2015, 8:26 PM

## 2015-11-08 NOTE — Discharge Instructions (Signed)

## 2015-11-16 ENCOUNTER — Encounter: Payer: Self-pay | Admitting: Women's Health

## 2015-11-16 ENCOUNTER — Ambulatory Visit (INDEPENDENT_AMBULATORY_CARE_PROVIDER_SITE_OTHER): Payer: Medicaid Other | Admitting: Women's Health

## 2015-11-16 VITALS — BP 100/80 | HR 88 | Wt 173.4 lb

## 2015-11-16 DIAGNOSIS — F1290 Cannabis use, unspecified, uncomplicated: Secondary | ICD-10-CM

## 2015-11-16 DIAGNOSIS — O26893 Other specified pregnancy related conditions, third trimester: Secondary | ICD-10-CM

## 2015-11-16 DIAGNOSIS — Z3483 Encounter for supervision of other normal pregnancy, third trimester: Secondary | ICD-10-CM

## 2015-11-16 DIAGNOSIS — R630 Anorexia: Secondary | ICD-10-CM

## 2015-11-16 DIAGNOSIS — F1210 Cannabis abuse, uncomplicated: Secondary | ICD-10-CM

## 2015-11-16 DIAGNOSIS — Z3493 Encounter for supervision of normal pregnancy, unspecified, third trimester: Secondary | ICD-10-CM

## 2015-11-16 DIAGNOSIS — Z3A36 36 weeks gestation of pregnancy: Secondary | ICD-10-CM

## 2015-11-16 DIAGNOSIS — O99323 Drug use complicating pregnancy, third trimester: Secondary | ICD-10-CM

## 2015-11-16 NOTE — Progress Notes (Signed)
Low-risk OB appointment G3P1011 [redacted]w[redacted]d Estimated Date of Delivery: 12/15/15 BP 100/80 mmHg  Pulse 88  Wt 173 lb 6.4 oz (78.654 kg)  LMP 02/12/2015 (Approximate)  BP, weight, reviewed.  Unable to void at this time. Refer to obstetrical flow sheet for FH & FHR.  Reports good fm.  Denies regular uc's, lof, vb, or uti s/s. No appetite since Thursday, states she hasn't consumed any solid food since Thurs. Has to make herself drink liquids. Doesn't feel nauseated, does have nausea meds at home. No diarrhea, no recent sick contacts. Feels like she just sleeps and lays around all the time. Denies depression/anxiety or h/o same. Denies SI. Very stoic. Asked if she is interested in counseling- states she called [facility] herself 2wks ago and has seen them once for counseling. When asked what made her call, she stated 'that's when my decreased appetite started'. Only set up to see them once/mth at this time. Encouraged her to call to see them more often. States they have not mentioned anything about her being depressed/they just talk. Discussed antidepressants, states she doesn't want to take any medicine. Just 'wants this baby out', lots of pressure and feeling uncomfortable, just 'ready to have this baby'. Encouraged eating small snacks/meals throughout the day, supplement w/ boost/ensure if needed.  Reviewed ptl s/s, fkc. Plan:  Continue routine obstetrical care  F/U in 1wk for OB appointment and gbs

## 2015-11-16 NOTE — Patient Instructions (Signed)
Boost/ensure 2-3 times daily  Call the office [phone]) or go to [facility] if:  You begin to have strong, frequent contractions  Your water breaks.  Sometimes it is a big gush of fluid, sometimes it is just a trickle that keeps getting your panties wet or running down your legs  You have vaginal bleeding.  It is normal to have a small amount of spotting if your cervix was checked.   You don't feel your baby moving like normal.  If you don't, get you something to eat and drink and lay down and focus on feeling your baby move.  You should feel at least 10 movements in 2 hours.  If you don't, you should call the office or go to [facility] Preterm labor is when labor starts at less than 37 weeks of pregnancy. The normal length of a pregnancy is 39 to 41 weeks. CAUSES Often, there is no identifiable underlying cause as to why a woman goes into preterm labor. One of the most common known causes of preterm labor is infection. Infections of the uterus, cervix, vagina, amniotic sac, bladder, kidney, or even the lungs (pneumonia) can cause labor to start. Other suspected causes of preterm labor include:   Urogenital infections, such as yeast infections and bacterial vaginosis.   Uterine abnormalities (uterine shape, uterine septum, fibroids, or bleeding from the placenta).   A cervix that has been operated on (it may fail to stay closed).   Malformations in the fetus.   Multiple gestations (twins, triplets, and so on).   Breakage of the amniotic sac.  RISK FACTORS  Having a previous history of preterm labor.   Having premature rupture of membranes (PROM).   Having a placenta that covers the opening of the cervix (placenta previa).   Having a placenta that separates from the uterus (placental abruption).   Having a cervix that is too weak to hold the fetus in the uterus (incompetent cervix).   Having too much fluid in the amniotic  sac (polyhydramnios).   Taking illegal drugs or smoking while pregnant.   Not gaining enough weight while pregnant.   Being younger than 26 and older than 32 years old.   Having a low socioeconomic status.   Being African American. SYMPTOMS Signs and symptoms of preterm labor include:   Menstrual-like cramps, abdominal pain, or back pain.  Uterine contractions that are regular, as frequent as six in an hour, regardless of their intensity (may be mild or painful).  Contractions that start on the top of the uterus and spread down to the lower abdomen and back.   A sense of increased pelvic pressure.   A watery or bloody mucus discharge that comes from the vagina.  TREATMENT Depending on the length of the pregnancy and other circumstances, your health care provider may suggest bed rest. If necessary, there are medicines that can be given to stop contractions and to mature the fetal lungs. If labor happens before 34 weeks of pregnancy, a prolonged hospital stay may be recommended. Treatment depends on the condition of both you and the fetus.  WHAT SHOULD YOU DO IF YOU THINK YOU ARE IN PRETERM LABOR? Call your health care provider right away. You will need to go to the hospital to get checked immediately. HOW CAN YOU PREVENT PRETERM LABOR IN FUTURE PREGNANCIES? You should:   Stop smoking if you smoke.  Maintain healthy weight gain and avoid chemicals and drugs that are not  necessary.  Be watchful for any type of infection.  Inform your health care provider if you have a known history of preterm labor.   This information is not intended to replace advice given to you by your health care provider. Make sure you discuss any questions you have with your health care provider.   Document Released: 07/21/2003 Document Revised: 12/31/2012 Document Reviewed: 06/02/2012 Elsevier Interactive Patient Education [employer].

## 2015-11-23 ENCOUNTER — Encounter: Payer: Self-pay | Admitting: Women's Health

## 2015-11-23 ENCOUNTER — Ambulatory Visit (INDEPENDENT_AMBULATORY_CARE_PROVIDER_SITE_OTHER): Payer: Medicaid Other | Admitting: Women's Health

## 2015-11-23 VITALS — BP 118/64 | HR 72 | Wt 176.0 lb

## 2015-11-23 DIAGNOSIS — Z1159 Encounter for screening for other viral diseases: Secondary | ICD-10-CM

## 2015-11-23 DIAGNOSIS — Z118 Encounter for screening for other infectious and parasitic diseases: Secondary | ICD-10-CM

## 2015-11-23 DIAGNOSIS — Z1389 Encounter for screening for other disorder: Secondary | ICD-10-CM

## 2015-11-23 DIAGNOSIS — Z3483 Encounter for supervision of other normal pregnancy, third trimester: Secondary | ICD-10-CM | POA: Diagnosis not present

## 2015-11-23 DIAGNOSIS — Z331 Pregnant state, incidental: Secondary | ICD-10-CM

## 2015-11-23 DIAGNOSIS — Z3493 Encounter for supervision of normal pregnancy, unspecified, third trimester: Secondary | ICD-10-CM

## 2015-11-23 DIAGNOSIS — Z3685 Encounter for antenatal screening for Streptococcus B: Secondary | ICD-10-CM

## 2015-11-23 DIAGNOSIS — Z3A37 37 weeks gestation of pregnancy: Secondary | ICD-10-CM

## 2015-11-23 LAB — POCT URINALYSIS DIPSTICK
GLUCOSE UA: NEGATIVE
Ketones, UA: NEGATIVE
Leukocytes, UA: NEGATIVE
Nitrite, UA: NEGATIVE
Protein, UA: NEGATIVE

## 2015-11-23 NOTE — Patient Instructions (Signed)
Call the office ([phone]) or go to [facility] if:  You begin to have strong, frequent contractions  Your water breaks.  Sometimes it is a big gush of fluid, sometimes it is just a trickle that keeps getting your panties wet or running down your legs  You have vaginal bleeding.  It is normal to have a small amount of spotting if your cervix was checked.   You don't feel your baby moving like normal.  If you don't, get you something to eat and drink and lay down and focus on feeling your baby move.  You should feel at least 10 movements in 2 hours.  If you don't, you should call the office or go to [facility].    Braxton Hicks Contractions Contractions of the uterus can occur throughout pregnancy. Contractions are not always a sign that you are in labor.  WHAT ARE BRAXTON HICKS CONTRACTIONS?  Contractions that occur before labor are called Braxton Hicks contractions, or false labor. Toward the end of pregnancy (32-34 weeks), these contractions can develop more often and may become more forceful. This is not true labor because these contractions do not result in opening (dilatation) and thinning of the cervix. They are sometimes difficult to tell apart from true labor because these contractions can be forceful and people have different pain tolerances. You should not feel embarrassed if you go to the hospital with false labor. Sometimes, the only way to tell if you are in true labor is for your health care provider to look for changes in the cervix. If there are no prenatal problems or other health problems associated with the pregnancy, it is completely safe to be sent home with false labor and await the onset of true labor. HOW CAN YOU TELL THE DIFFERENCE BETWEEN TRUE AND FALSE LABOR? False Labor  The contractions of false labor are usually shorter and not as hard as those of true labor.   The contractions are usually irregular.   The contractions are often felt in the front of  the lower abdomen and in the groin.   The contractions may go away when you walk around or change positions while lying down.   The contractions get weaker and are shorter lasting as time goes on.   The contractions do not usually become progressively stronger, regular, and closer together as with true labor.  True Labor  Contractions in true labor last 30-70 seconds, become very regular, usually become more intense, and increase in frequency.   The contractions do not go away with walking.   The discomfort is usually felt in the top of the uterus and spreads to the lower abdomen and low back.   True labor can be determined by your health care provider with an exam. This will show that the cervix is dilating and getting thinner.  WHAT TO REMEMBER  Keep up with your usual exercises and follow other instructions given by your health care provider.   Take medicines as directed by your health care provider.   Keep your regular prenatal appointments.   Eat and drink lightly if you think you are going into labor.   If Braxton Hicks contractions are making you uncomfortable:   Change your position from lying down or resting to walking, or from walking to resting.   Sit and rest in a tub of warm water.   Drink 2-3 glasses of water. Dehydration may cause these contractions.   Do slow and deep breathing several times an hour.    WHEN SHOULD I SEEK IMMEDIATE MEDICAL CARE? Seek immediate medical care if:  Your contractions become stronger, more regular, and closer together.   You have fluid leaking or gushing from your vagina.   You have a fever.   You pass blood-tinged mucus.   You have vaginal bleeding.   You have continuous abdominal pain.   You have low back pain that you never had before.   You feel your baby's head pushing down and causing pelvic pressure.   Your baby is not moving as much as it used to.    This information is not intended to  replace advice given to you by your health care provider. Make sure you discuss any questions you have with your health care provider.   Document Released: 04/30/2005 Document Revised: 05/05/2013 Document Reviewed: 02/09/2013 Elsevier Interactive Patient Education [employer].

## 2015-11-23 NOTE — Progress Notes (Signed)
Low-risk OB appointment G3P1011 [redacted]w[redacted]d Estimated Date of Delivery: 12/15/15 BP 118/64 mmHg  Pulse 72  Wt 176 lb (79.833 kg)  LMP 02/12/2015 (Approximate)  BP, weight, and urine reviewed.  Refer to obstetrical flow sheet for FH & FHR.  Reports good fm.  Denies regular uc's, lof, vb, or uti s/s. No complaints. Eating a little better than last week, has gained 3lbs. Next appt w/ [facility] is next week. Seems to be in a little better spirits this week. GBS & gc/ct collected SVE per request: 1.5/50/-2, vtx Reviewed labor s/s, fkc. Plan:  Continue routine obstetrical care  F/U in 1wk for OB appointment

## 2015-11-25 LAB — GC/CHLAMYDIA PROBE AMP
CHLAMYDIA, DNA PROBE: NEGATIVE
Neisseria gonorrhoeae by PCR: NEGATIVE

## 2015-11-25 LAB — STREP GP B NAA: STREP GROUP B AG: POSITIVE — AB

## 2015-11-26 ENCOUNTER — Encounter (HOSPITAL_COMMUNITY): Payer: Self-pay | Admitting: *Deleted

## 2015-11-26 ENCOUNTER — Inpatient Hospital Stay (HOSPITAL_COMMUNITY)
Admission: AD | Admit: 2015-11-26 | Discharge: 2015-11-26 | Disposition: A | Discharge status: Home / Self Care | Payer: Medicaid Other | Source: Ambulatory Visit | Attending: Obstetrics & Gynecology | Admitting: Obstetrics & Gynecology

## 2015-11-26 DIAGNOSIS — Z888 Allergy status to other drugs, medicaments and biological substances status: Secondary | ICD-10-CM | POA: Diagnosis not present

## 2015-11-26 DIAGNOSIS — O0933 Supervision of pregnancy with insufficient antenatal care, third trimester: Secondary | ICD-10-CM

## 2015-11-26 DIAGNOSIS — R102 Pelvic and perineal pain: Secondary | ICD-10-CM | POA: Diagnosis present

## 2015-11-26 DIAGNOSIS — O99613 Diseases of the digestive system complicating pregnancy, third trimester: Secondary | ICD-10-CM

## 2015-11-26 DIAGNOSIS — O26893 Other specified pregnancy related conditions, third trimester: Secondary | ICD-10-CM | POA: Insufficient documentation

## 2015-11-26 DIAGNOSIS — Z8249 Family history of ischemic heart disease and other diseases of the circulatory system: Secondary | ICD-10-CM | POA: Diagnosis not present

## 2015-11-26 DIAGNOSIS — K591 Functional diarrhea: Secondary | ICD-10-CM

## 2015-11-26 DIAGNOSIS — Z3A37 37 weeks gestation of pregnancy: Secondary | ICD-10-CM | POA: Insufficient documentation

## 2015-11-26 DIAGNOSIS — Z87891 Personal history of nicotine dependence: Secondary | ICD-10-CM | POA: Diagnosis not present

## 2015-11-26 DIAGNOSIS — R197 Diarrhea, unspecified: Secondary | ICD-10-CM | POA: Diagnosis not present

## 2015-11-26 LAB — URINALYSIS, ROUTINE W REFLEX MICROSCOPIC
Bilirubin Urine: NEGATIVE
Glucose, UA: NEGATIVE mg/dL
Hgb urine dipstick: NEGATIVE
Ketones, ur: NEGATIVE mg/dL
Leukocytes, UA: NEGATIVE
Nitrite: NEGATIVE
Protein, ur: NEGATIVE mg/dL
Specific Gravity, Urine: 1.02 (ref 1.005–1.030)
pH: 6 (ref 5.0–8.0)

## 2015-11-26 NOTE — MAU Provider Note (Signed)
MAU HISTORY AND PHYSICAL  Chief Complaint:  Vaginal Pain and Diarrhea   Valerie Huber is a 30 y.o.  G3P1011 with IUP at [redacted]w[redacted]d presenting for Vaginal Pain and Diarrhea . Patient states she has been having  irregular contractions, none vaginal bleeding, intact membranes, with active fetal movement.    Patient reports she has been having increased pelvic pressure for the past week or so. She denies dysuria and reports only rarely feeling contractions. She is drinking plenty of fluids and staying hydrated. She has not fevers or chills. She reports that 4 days ago she had many BMs that where loose that day. Yesterday she only had two BM and today she has only had one. She reports no blood in her stool. She denies fevers or chills. No one else in the home has loose BM. She has no sick contacts and is not high risk for c.diff  Past Medical History  Diagnosis Date  . Asthma   . Vaginal Pap smear, abnormal   . Endometriosis   . Seizures ([place])   . Pelvic pain in female 12/31/2014  . Second degree uterine prolapse 12/31/2014  . Irregular periods 12/31/2014  . Pregnant 04/14/2015    Past Surgical History  Procedure Laterality Date  . Surgery for endometriosis  2005  . Leep    . Esophagogastroduodenoscopy N/A 12/10/2014    SLF: hematoemesis due to moderate erosive gastritis, duoedentitis, and duodenal ulcer    Family History  Problem Relation Age of Onset  . Cancer Mother     kidney   . Asthma Mother   . Hypertension Mother   . Colon cancer Neg Hx   . Cancer Paternal Grandmother     Social History  Substance Use Topics  . Smoking status: Former Smoker -- 0.25 packs/day for 14 years    Types: Cigarettes  . Smokeless tobacco: Never Used     Comment: QUIT SMOKING ON JULY 11th. STATES WOULD ONLY HAVE A FEW CIGARETTES A WEEK PRIOR TO THIS.   [name] Alcohol Use: No     Comment: occ.    Allergies  Allergen Reactions  . Flexeril [Cyclobenzaprine] Hives  . Ibuprofen Other (See Comments)   "If I take 2-3 during the day it causes my stomach to become upset"    Prescriptions prior to admission  Medication Sig Dispense Refill Last Dose  . EPINEPHrine (EPI-PEN) 0.3 mg/0.3 mL SOAJ injection Inject 0.3 mg into the muscle once. Reported on 11/23/2015   Not Taking  . prenatal vitamin w/FE, FA (PRENATAL 1 + 1) 27-1 MG TABS tablet Take 1 tablet by mouth daily at 12 noon. (Patient not taking: Reported on 11/16/2015) 30 each 11 Not Taking    Review of Systems - Negative except for what is mentioned in HPI.  Physical Exam  Blood pressure 109/72, pulse 94, temperature 98.3 F (36.8 C), temperature source Oral, resp. rate 16, last menstrual period 02/12/2015. GENERAL: Well-developed, well-nourished female in no acute distress.  LUNGS: Clear to auscultation bilaterally.  HEART: Regular rate and rhythm. ABDOMEN: Soft, nontender, nondistended, gravid. No CVA tenderness EXTREMITIES: Nontender, no edema, 2+ distal pulses. Cervical Exam: 1.5 think high FHT:  140/mod var/ reactive cat 1 Contractions: Every very irregular   Labs: Results for orders placed or performed during the hospital encounter of 11/26/15 (from the past 24 hour(s))  Urinalysis, Routine w reflex microscopic (not at [facility])   Collection Time: 11/26/15  1:47 PM  Result Value Ref Range   Color, Urine YELLOW YELLOW  APPearance CLEAR CLEAR   Specific Gravity, Urine 1.020 1.005 - 1.030   pH 6.0 5.0 - 8.0   Glucose, UA NEGATIVE NEGATIVE mg/dL   Hgb urine dipstick NEGATIVE NEGATIVE   Bilirubin Urine NEGATIVE NEGATIVE   Ketones, ur NEGATIVE NEGATIVE mg/dL   Protein, ur NEGATIVE NEGATIVE mg/dL   Nitrite NEGATIVE NEGATIVE   Leukocytes, UA NEGATIVE NEGATIVE    Imaging Studies:  No results found.  Assessment: Valerie Huber is  30 y.o. G3P1011 at [redacted]w[redacted]d presents with Vaginal Pain and Diarrhea .  Plan: Pt with pelvic pressure. Pt is not experiencing contractions. Cervix is unchanged. This is likely normal sensation of  pregnancy.  Loose BM: resolving likley viral, not concern for bacterial with no fever. Possibly viral gastro but also not very likely with no nausea. Will advised supportive care and follow up if not resolving.   [name] 7/15/20172:41 PM

## 2015-11-26 NOTE — MAU Note (Signed)
C/o diarrhea since wed night; c/o vaginal pressure since Wednesday night;

## 2015-11-26 NOTE — Discharge Instructions (Signed)
Diarrhea  Diarrhea is watery poop (stool). It can make you feel weak, tired, thirsty, or give you a dry mouth (signs of dehydration). Watery poop is a sign of another problem, most often an infection. It often lasts 2-3 days. It can last longer if it is a sign of something serious. Take care of yourself as told by your doctor.  HOME CARE   · Drink 1 cup (8 ounces) of fluid each time you have watery poop.  · Do not drink the following fluids:    Those that contain simple sugars (fructose, glucose, galactose, lactose, sucrose, maltose).    Sports drinks.    Fruit juices.    Whole milk products.    Sodas.    Drinks with caffeine (coffee, tea, soda) or alcohol.  · Oral rehydration solution may be used if the doctor says it is okay. You may make your own solution. Follow this recipe:    ?-? teaspoon table salt.    ¾ teaspoon baking soda.    ? teaspoon salt substitute containing potassium chloride.    1 ? tablespoons sugar.    1 liter (34 ounces) of water.  · Avoid the following foods:    High fiber foods, such as raw fruits and vegetables.    Nuts, seeds, and whole grain breads and cereals.     Those that are sweetened with sugar alcohols (xylitol, sorbitol, mannitol).  · Try eating the following foods:    Starchy foods, such as rice, toast, pasta, low-sugar cereal, oatmeal, baked potatoes, crackers, and bagels.    Bananas.    Applesauce.  · Eat probiotic-rich foods, such as yogurt and milk products that are fermented.  · Wash your hands well after each time you have watery poop.  · Only take medicine as told by your doctor.  · Take a warm bath to help lessen burning or pain from having watery poop.  GET HELP RIGHT AWAY IF:   · You cannot drink fluids without throwing up (vomiting).  · You keep throwing up.  · You have blood in your poop, or your poop looks black and tarry.  · You do not pee (urinate) in 6-8 hours, or there is only a small amount of very dark pee.  · You have belly (abdominal) pain that gets worse or  stays in the same spot (localizes).  · You are weak, dizzy, confused, or light-headed.  · You have a very bad headache.  · Your watery poop gets worse or does not get better.  · You have a fever or lasting symptoms for more than 2-3 days.  · You have a fever and your symptoms suddenly get worse.  MAKE SURE YOU:   · Understand these instructions.  · Will watch your condition.  · Will get help right away if you are not doing well or get worse.     This information is not intended to replace advice given to you by your health care provider. Make sure you discuss any questions you have with your health care provider.     Document Released: 10/17/2007 Document Revised: 05/21/2014 Document Reviewed: 01/06/2012  Elsevier Interactive Patient Education ©2016 [employer]

## 2015-11-29 ENCOUNTER — Inpatient Hospital Stay (HOSPITAL_COMMUNITY)
Admission: AD | Admit: 2015-11-29 | Discharge: 2015-11-29 | Disposition: A | Discharge status: Home / Self Care | Payer: Medicaid Other | Source: Ambulatory Visit | Attending: Obstetrics and Gynecology | Admitting: Obstetrics and Gynecology

## 2015-11-29 ENCOUNTER — Encounter (HOSPITAL_COMMUNITY): Payer: Self-pay

## 2015-11-29 DIAGNOSIS — Z3493 Encounter for supervision of normal pregnancy, unspecified, third trimester: Secondary | ICD-10-CM | POA: Diagnosis not present

## 2015-11-29 DIAGNOSIS — Z3A38 38 weeks gestation of pregnancy: Secondary | ICD-10-CM | POA: Insufficient documentation

## 2015-11-29 NOTE — MAU Note (Signed)
Pt reports contractions and "a lot of bleeding " since 2 am. Also reports a lot of pressure.

## 2015-11-29 NOTE — Discharge Instructions (Signed)
Keep your scheduled appointment for prenatal care. Call office with further concerns or return to MAU as needed.

## 2015-11-30 ENCOUNTER — Encounter: Payer: Self-pay | Admitting: Advanced Practice Midwife

## 2015-11-30 ENCOUNTER — Telehealth: Payer: Self-pay | Admitting: Women's Health

## 2015-11-30 ENCOUNTER — Encounter: Payer: Medicaid Other | Admitting: Advanced Practice Midwife

## 2015-11-30 DIAGNOSIS — F339 Major depressive disorder, recurrent, unspecified: Secondary | ICD-10-CM | POA: Insufficient documentation

## 2015-11-30 DIAGNOSIS — Z87898 Personal history of other specified conditions: Secondary | ICD-10-CM | POA: Insufficient documentation

## 2015-11-30 DIAGNOSIS — F1911 Other psychoactive substance abuse, in remission: Secondary | ICD-10-CM | POA: Insufficient documentation

## 2015-11-30 DIAGNOSIS — F32A Depression, unspecified: Secondary | ICD-10-CM | POA: Insufficient documentation

## 2015-11-30 NOTE — Telephone Encounter (Signed)
Pt c/o contractions every 4-5 min, a lot of pain, been seen at [facility] this am was told dilated to 4-5 cm and having contractions. Pt advised to go to [facility] for evaluation. Pt verbalized understanding.

## 2015-12-01 ENCOUNTER — Inpatient Hospital Stay (HOSPITAL_COMMUNITY)
Admission: AD | Admit: 2015-12-01 | Discharge: 2015-12-04 | DRG: 775 | Disposition: A | Discharge status: Home / Self Care | Payer: Medicaid Other | Source: Ambulatory Visit | Attending: Family Medicine | Admitting: Family Medicine

## 2015-12-01 ENCOUNTER — Inpatient Hospital Stay (HOSPITAL_COMMUNITY): Payer: Medicaid Other | Admitting: Anesthesiology

## 2015-12-01 ENCOUNTER — Encounter (HOSPITAL_COMMUNITY): Payer: Self-pay

## 2015-12-01 DIAGNOSIS — Z8249 Family history of ischemic heart disease and other diseases of the circulatory system: Secondary | ICD-10-CM | POA: Diagnosis not present

## 2015-12-01 DIAGNOSIS — Z825 Family history of asthma and other chronic lower respiratory diseases: Secondary | ICD-10-CM

## 2015-12-01 DIAGNOSIS — IMO0001 Reserved for inherently not codable concepts without codable children: Secondary | ICD-10-CM

## 2015-12-01 DIAGNOSIS — Z3A38 38 weeks gestation of pregnancy: Secondary | ICD-10-CM

## 2015-12-01 DIAGNOSIS — O99354 Diseases of the nervous system complicating childbirth: Secondary | ICD-10-CM | POA: Diagnosis present

## 2015-12-01 DIAGNOSIS — J45909 Unspecified asthma, uncomplicated: Secondary | ICD-10-CM | POA: Diagnosis present

## 2015-12-01 DIAGNOSIS — Z8711 Personal history of peptic ulcer disease: Secondary | ICD-10-CM | POA: Diagnosis not present

## 2015-12-01 DIAGNOSIS — O9952 Diseases of the respiratory system complicating childbirth: Secondary | ICD-10-CM | POA: Diagnosis present

## 2015-12-01 DIAGNOSIS — O99824 Streptococcus B carrier state complicating childbirth: Secondary | ICD-10-CM | POA: Diagnosis present

## 2015-12-01 DIAGNOSIS — Z87891 Personal history of nicotine dependence: Secondary | ICD-10-CM

## 2015-12-01 DIAGNOSIS — O0933 Supervision of pregnancy with insufficient antenatal care, third trimester: Secondary | ICD-10-CM

## 2015-12-01 DIAGNOSIS — Z3483 Encounter for supervision of other normal pregnancy, third trimester: Secondary | ICD-10-CM | POA: Diagnosis present

## 2015-12-01 LAB — CBC
HEMATOCRIT: 31.6 % — AB (ref 36.0–46.0)
Hemoglobin: 10.8 g/dL — ABNORMAL LOW (ref 12.0–15.0)
MCH: 31.2 pg (ref 26.0–34.0)
MCHC: 34.2 g/dL (ref 30.0–36.0)
MCV: 91.3 fL (ref 78.0–100.0)
PLATELETS: 237 10*3/uL (ref 150–400)
RBC: 3.46 MIL/uL — ABNORMAL LOW (ref 3.87–5.11)
RDW: 13.3 % (ref 11.5–15.5)
WBC: 11.2 10*3/uL — ABNORMAL HIGH (ref 4.0–10.5)

## 2015-12-01 LAB — TYPE AND SCREEN
ABO/RH(D): A POS
ANTIBODY SCREEN: NEGATIVE

## 2015-12-01 MED ORDER — PHENYLEPHRINE 40 MCG/ML (10ML) SYRINGE FOR IV PUSH (FOR BLOOD PRESSURE SUPPORT)
80.0000 ug | PREFILLED_SYRINGE | INTRAVENOUS | Status: DC | PRN
  Filled 2015-12-01: qty 5

## 2015-12-01 MED ORDER — LIDOCAINE HCL (PF) 1 % IJ SOLN
INTRAMUSCULAR | Status: DC | PRN
  Administered 2015-12-01 (×2): 4 mL

## 2015-12-01 MED ORDER — ACETAMINOPHEN 325 MG PO TABS: 650.0000 mg | ORAL_TABLET | ORAL | Status: DC | PRN

## 2015-12-01 MED ORDER — SOD CITRATE-CITRIC ACID 500-334 MG/5ML PO SOLN: 30.0000 mL | ORAL | Status: DC | PRN

## 2015-12-01 MED ORDER — PHENYLEPHRINE 40 MCG/ML (10ML) SYRINGE FOR IV PUSH (FOR BLOOD PRESSURE SUPPORT)
PREFILLED_SYRINGE | INTRAVENOUS | Status: AC
  Filled 2015-12-01: qty 20

## 2015-12-01 MED ORDER — LIDOCAINE HCL (PF) 1 % IJ SOLN
30.0000 mL | INTRAMUSCULAR | Status: DC | PRN
  Filled 2015-12-01: qty 30

## 2015-12-01 MED ORDER — EPHEDRINE 5 MG/ML INJ
10.0000 mg | INTRAVENOUS | Status: DC | PRN
  Filled 2015-12-01: qty 2

## 2015-12-01 MED ORDER — LACTATED RINGERS IV SOLN: 500.0000 mL | Freq: Once | INTRAVENOUS | Status: DC

## 2015-12-01 MED ORDER — PHENYLEPHRINE 40 MCG/ML (10ML) SYRINGE FOR IV PUSH (FOR BLOOD PRESSURE SUPPORT)
80.0000 ug | PREFILLED_SYRINGE | INTRAVENOUS | Status: AC | PRN
  Administered 2015-12-01 – 2015-12-02 (×3): 80 ug via INTRAVENOUS

## 2015-12-01 MED ORDER — PENICILLIN G POTASSIUM 5000000 UNITS IJ SOLR
2.5000 10*6.[IU] | INTRAVENOUS | Status: DC
  Filled 2015-12-01 (×3): qty 2.5

## 2015-12-01 MED ORDER — OXYCODONE-ACETAMINOPHEN 5-325 MG PO TABS: 2.0000 | ORAL_TABLET | ORAL | Status: DC | PRN

## 2015-12-01 MED ORDER — PENICILLIN G POTASSIUM 5000000 UNITS IJ SOLR
5.0000 10*6.[IU] | Freq: Once | INTRAVENOUS | Status: AC
  Administered 2015-12-02: 5 10*6.[IU] via INTRAVENOUS
  Filled 2015-12-01 (×2): qty 5

## 2015-12-01 MED ORDER — LACTATED RINGERS IV SOLN
INTRAVENOUS | Status: DC
  Administered 2015-12-02: via INTRAVENOUS

## 2015-12-01 MED ORDER — FLEET ENEMA 7-19 GM/118ML RE ENEM: 1.0000 | ENEMA | RECTAL | Status: DC | PRN

## 2015-12-01 MED ORDER — FENTANYL 2.5 MCG/ML BUPIVACAINE 1/10 % EPIDURAL INFUSION (WH - ANES)
14.0000 mL/h | INTRAMUSCULAR | Status: DC | PRN
  Administered 2015-12-01 (×2): 14 mL/h via EPIDURAL

## 2015-12-01 MED ORDER — LACTATED RINGERS IV SOLN
500.0000 mL | INTRAVENOUS | Status: DC | PRN
  Administered 2015-12-01: 500 mL via INTRAVENOUS

## 2015-12-01 MED ORDER — FENTANYL 2.5 MCG/ML BUPIVACAINE 1/10 % EPIDURAL INFUSION (WH - ANES)
INTRAMUSCULAR | Status: AC
  Filled 2015-12-01: qty 125

## 2015-12-01 MED ORDER — OXYTOCIN 40 UNITS IN LACTATED RINGERS INFUSION - SIMPLE MED
2.5000 [IU]/h | INTRAVENOUS | Status: DC
  Filled 2015-12-01: qty 1000

## 2015-12-01 MED ORDER — OXYCODONE-ACETAMINOPHEN 5-325 MG PO TABS: 1.0000 | ORAL_TABLET | ORAL | Status: DC | PRN

## 2015-12-01 MED ORDER — OXYTOCIN BOLUS FROM INFUSION
500.0000 mL | INTRAVENOUS | Status: DC
  Administered 2015-12-02: 500 mL via INTRAVENOUS

## 2015-12-01 MED ORDER — FENTANYL CITRATE (PF) 100 MCG/2ML IJ SOLN
50.0000 ug | INTRAMUSCULAR | Status: DC | PRN
Start: 2015-12-01 — End: 2015-12-02

## 2015-12-01 MED ORDER — DIPHENHYDRAMINE HCL 50 MG/ML IJ SOLN: 12.5000 mg | INTRAMUSCULAR | Status: DC | PRN

## 2015-12-01 MED ORDER — ONDANSETRON HCL 4 MG/2ML IJ SOLN: 4.0000 mg | Freq: Four times a day (QID) | INTRAMUSCULAR | Status: DC | PRN

## 2015-12-01 NOTE — Progress Notes (Signed)
Notified of pt SROM, vaginal exam, and discomfort. Will admit to labor and delivery.

## 2015-12-01 NOTE — Anesthesia Procedure Notes (Signed)
Epidural Patient location during procedure: OB  Staffing Anesthesiologist: Joclynn Lumb Performed by: anesthesiologist   Preanesthetic Checklist Completed: patient identified, site marked, surgical consent, pre-op evaluation, timeout performed, IV checked, risks and benefits discussed and monitors and equipment checked  Epidural Patient position: sitting Prep: site prepped and draped and DuraPrep Patient monitoring: continuous pulse ox and blood pressure Approach: midline Location: L3-L4 Injection technique: LOR saline  Needle:  Needle type: Tuohy  Needle gauge: 17 G Needle length: 9 cm and 9 Needle insertion depth: 6 cm Catheter type: closed end flexible Catheter size: 19 Gauge Catheter at skin depth: 10 cm Test dose: negative  Assessment Events: blood not aspirated, injection not painful, no injection resistance, negative IV test and no paresthesia  Additional Notes Patient identified. Risks/Benefits/Options discussed with patient including but not limited to bleeding, infection, nerve damage, paralysis, failed block, incomplete pain control, headache, blood pressure changes, nausea, vomiting, reactions to medication both or allergic, itching and postpartum back pain. Confirmed with bedside nurse the patient's most recent platelet count. Confirmed with patient that they are not currently taking any anticoagulation, have any bleeding history or any family history of bleeding disorders. Patient expressed understanding and wished to proceed. All questions were answered. Sterile technique was used throughout the entire procedure. Please see nursing notes for vital signs. Test dose was given through epidural catheter and negative prior to continuing to dose epidural or start infusion. Warning signs of high block given to the patient including shortness of breath, tingling/numbness in hands, complete motor block, or any concerning symptoms with instructions to call for help. Patient was  given instructions on fall risk and not to get out of bed. All questions and concerns addressed with instructions to call with any issues or inadequate analgesia.    

## 2015-12-01 NOTE — MAU Note (Signed)
Pt presents complaining of SROM at 2119 and contractions every 3 minutes. 5cm on last exam.

## 2015-12-01 NOTE — H&P (Signed)
Amelyah Bargerstock is a 30 y.o. female G3P1011 with IUP at [redacted]w[redacted]d presenting for contractions. Pt states she has been having regular, every 2-3 minutes contractions, associated with none vaginal bleeding for 2 hours..  Membranes are ruptured, clear fluid, with active fetal movement.   [facility], missed a lot of appts  Prenatal History/Complications:  Insufficient care  Past Medical History: Past Medical History  Diagnosis Date  . Asthma   . Vaginal Pap smear, abnormal   . Endometriosis   . Seizures ([place])   . Pelvic pain in female 12/31/2014  . Second degree uterine prolapse 12/31/2014  . Irregular periods 12/31/2014  . Pregnant 04/14/2015    Past Surgical History: Past Surgical History  Procedure Laterality Date  . Surgery for endometriosis  2005  . Leep    . Esophagogastroduodenoscopy N/A 12/10/2014    SLF: hematoemesis due to moderate erosive gastritis, duoedentitis, and duodenal ulcer    Obstetrical History: OB History    Gravida Para Term Preterm AB TAB SAB Ectopic Multiple Living   3 1 1  1 1    1        Social History: Social History   Social History  . Marital Status: Single    Spouse Name: N/A  . Number of Children: N/A  . Years of Education: N/A   Occupational History  . Housecleaning    Social History Main Topics  . Smoking status: Former Smoker -- 0.25 packs/day for 14 years    Types: Cigarettes  . Smokeless tobacco: Never Used     Comment: QUIT SMOKING ON JULY 11th. STATES WOULD ONLY HAVE A FEW CIGARETTES A WEEK PRIOR TO THIS.   [name] Alcohol Use: No     Comment: occ.  . Drug Use: No  . Sexual Activity: Yes    Birth Control/ Protection: None   Other Topics Concern  . None   Social History Narrative    Family History: Family History  Problem Relation Age of Onset  . Cancer Mother     kidney   . Asthma Mother   . Hypertension Mother   . Colon cancer Neg Hx   . Cancer Paternal Grandmother     Allergies: Allergies  Allergen  Reactions  . Flexeril [Cyclobenzaprine] Hives  . Ibuprofen Other (See Comments)    "If I take 2-3 during the day it causes my stomach to become upset"    Prescriptions prior to admission  Medication Sig Dispense Refill Last Dose  . acetaminophen (TYLENOL) 500 MG tablet Take 1,000 mg by mouth every 6 (six) hours as needed for mild pain.   Past Week at Unknown time  . EPINEPHrine (EPI-PEN) 0.3 mg/0.3 mL SOAJ injection Inject 0.3 mg into the muscle once. Reported on 11/23/2015   never  . prenatal vitamin w/FE, FA (PRENATAL 1 + 1) 27-1 MG TABS tablet Take 1 tablet by mouth daily at 12 noon. 4 each 11 Past Month at Unknown time     Prenatal Transfer Tool  Maternal Diabetes: No Genetic Screening: Normal Maternal Ultrasounds/Referrals: Normal Fetal Ultrasounds or other Referrals:  None Maternal Substance Abuse:  No Significant Maternal Medications:  None Significant Maternal Lab Results: Lab values include: Group B Strep positive     Review of Systems   Constitutional: Negative for fever and chills Eyes: Negative for visual disturbances Respiratory: Negative for shortness of breath, dyspnea Cardiovascular: Negative for chest pain or palpitations  Gastrointestinal: Negative for vomiting, diarrhea and constipation.  POSITIVE for abdominal pain (contractions) Genitourinary:  Negative for dysuria and urgency Musculoskeletal: Negative for back pain, joint pain, myalgias  Neurological: Negative for dizziness and headaches      Height 5\' 4"  (1.626 m), weight 78.926 kg (174 lb), last menstrual period 02/12/2015. General appearance: alert, cooperative and no distress Lungs: clear to auscultation bilaterally Heart: regular rate and rhythm Abdomen: soft, non-tender; bowel sounds normal Pelvic: leaking clear fluid Extremities: Homans sign is negative, no sign of DVT DTR's 2+ Presentation: cephalic Fetal monitoring  Baseline: 140 bpm, Variability: Good {> 6 bpm), Accelerations: Reactive  and Decelerations: Absent Uterine activity  2-3 minutes  Dilation: 6 Effacement (%): 80 Station: -1 Exam by:: Glenford Peers [facility]   Prenatal labs: ABO, Rh: A/Positive/-- (12/27 1451) Antibody: Negative (06/08 0938) Rubella: !Error! RPR: Non Reactive (06/08 0938)  HBsAg: Negative (12/27 1451)  HIV: Non Reactive (06/08 [id number])  GBS: Positive (07/12 1330)    [facility]  Initiated Care at  84 weeks  FOB [place]  Dating By 6 week [place]  Pap LEEP 8/16  Pap 2/17: neg w/ -HRHPV  GC/CT -/- on pap      Repeat @ 36wks:  -/-  Genetic Screen NT/IT: neg  CF screen neg  Anatomic [place] Normal female, '[name]'  Flu vaccine Declined 07/05/15  Tdap Recommended ~ 28wks  Glucose Screen  2 hr  82/170/140  GBS Pos  Feed Preference breast  Contraception condoms  Circumcision Yes, at [place] Interested, info given-didn't make it  Pediatrician [facility]    No results found for this or any previous visit (from the past 24 hour(s)).  Assessment: Jaedyn Leight is a 30 y.o. G3P1011 with an IUP at [redacted]w[redacted]d presenting for active labor  Plan: #Labor: expectant management #Pain:  Per request #FWB Cat 1 #ID: GBS: [id number]   CRESENZO-DISHMAN,Kevonta Phariss 12/01/2015, 10:46 PM

## 2015-12-01 NOTE — Anesthesia Preprocedure Evaluation (Signed)
Anesthesia Evaluation  Patient identified by MRN, date of birth, ID band Patient awake    Reviewed: Allergy & Precautions, NPO status , Patient's Chart, lab work & pertinent test results  History of Anesthesia Complications Negative for: history of anesthetic complications  Airway Mallampati: II  TM Distance: >3 FB Neck ROM: Full    Dental no notable dental hx. (+) Dental Advisory Given   Pulmonary asthma , former smoker,    Pulmonary exam normal breath sounds clear to auscultation       Cardiovascular negative cardio ROS Normal cardiovascular exam Rhythm:Regular Rate:Normal     Neuro/Psych Seizures - (last sz one year prior, no current therapy),  negative psych ROS   GI/Hepatic negative GI ROS, Neg liver ROS,   Endo/Other  negative endocrine ROS  Renal/GU negative Renal ROS  negative genitourinary   Musculoskeletal negative musculoskeletal ROS (+)   Abdominal   Peds negative pediatric ROS (+)  Hematology negative hematology ROS (+)   Anesthesia Other Findings   Reproductive/Obstetrics (+) Pregnancy                             Anesthesia Physical Anesthesia Plan  ASA: II  Anesthesia Plan: Epidural   Post-op Pain Management:    Induction:   Airway Management Planned:   Additional Equipment:   Intra-op Plan:   Post-operative Plan:   Informed Consent: I have reviewed the patients History and Physical, chart, labs and discussed the procedure including the risks, benefits and alternatives for the proposed anesthesia with the patient or authorized representative who has indicated his/her understanding and acceptance.   Dental advisory given  Plan Discussed with: CRNA  Anesthesia Plan Comments:         Anesthesia Quick Evaluation

## 2015-12-02 ENCOUNTER — Encounter: Payer: Medicaid Other | Admitting: Obstetrics and Gynecology

## 2015-12-02 ENCOUNTER — Encounter (HOSPITAL_COMMUNITY): Payer: Self-pay

## 2015-12-02 DIAGNOSIS — Z3A38 38 weeks gestation of pregnancy: Secondary | ICD-10-CM

## 2015-12-02 DIAGNOSIS — IMO0001 Reserved for inherently not codable concepts without codable children: Secondary | ICD-10-CM

## 2015-12-02 DIAGNOSIS — O99824 Streptococcus B carrier state complicating childbirth: Secondary | ICD-10-CM

## 2015-12-02 LAB — ABO/RH: ABO/RH(D): A POS

## 2015-12-02 MED ORDER — DIPHENHYDRAMINE HCL 25 MG PO CAPS: 25.0000 mg | ORAL_CAPSULE | Freq: Four times a day (QID) | ORAL | Status: DC | PRN

## 2015-12-02 MED ORDER — PENICILLIN G POTASSIUM 5000000 UNITS IJ SOLR
2.5000 10*6.[IU] | INTRAVENOUS | Status: DC
  Filled 2015-12-02 (×2): qty 2.5

## 2015-12-02 MED ORDER — ONDANSETRON HCL 4 MG/2ML IJ SOLN: 4.0000 mg | INTRAMUSCULAR | Status: DC | PRN

## 2015-12-02 MED ORDER — BENZOCAINE-MENTHOL 20-0.5 % EX AERO: 1.0000 "application " | INHALATION_SPRAY | CUTANEOUS | Status: DC | PRN

## 2015-12-02 MED ORDER — COCONUT OIL OIL: 1.0000 "application " | TOPICAL_OIL | Status: DC | PRN

## 2015-12-02 MED ORDER — SOD CITRATE-CITRIC ACID 500-334 MG/5ML PO SOLN: 30.0000 mL | ORAL | Status: DC | PRN

## 2015-12-02 MED ORDER — DIBUCAINE 1 % RE OINT: 1.0000 "application " | TOPICAL_OINTMENT | RECTAL | Status: DC | PRN

## 2015-12-02 MED ORDER — NALBUPHINE HCL 10 MG/ML IJ SOLN: 5.0000 mg | INTRAMUSCULAR | Status: DC | PRN

## 2015-12-02 MED ORDER — WITCH HAZEL-GLYCERIN EX PADS: 1.0000 "application " | MEDICATED_PAD | CUTANEOUS | Status: DC | PRN

## 2015-12-02 MED ORDER — LIDOCAINE HCL (PF) 1 % IJ SOLN
30.0000 mL | INTRAMUSCULAR | Status: DC | PRN
  Filled 2015-12-02: qty 30

## 2015-12-02 MED ORDER — ACETAMINOPHEN 325 MG PO TABS
650.0000 mg | ORAL_TABLET | ORAL | Status: DC | PRN
  Administered 2015-12-02 – 2015-12-04 (×8): 650 mg via ORAL
  Filled 2015-12-02 (×8): qty 2

## 2015-12-02 MED ORDER — OXYTOCIN 40 UNITS IN LACTATED RINGERS INFUSION - SIMPLE MED: 2.5000 [IU]/h | INTRAVENOUS | Status: DC

## 2015-12-02 MED ORDER — PENICILLIN G POTASSIUM 5000000 UNITS IJ SOLR
5.0000 10*6.[IU] | Freq: Once | INTRAVENOUS | Status: DC
  Filled 2015-12-02: qty 5

## 2015-12-02 MED ORDER — ONDANSETRON HCL 4 MG/2ML IJ SOLN: 4.0000 mg | Freq: Four times a day (QID) | INTRAMUSCULAR | Status: DC | PRN

## 2015-12-02 MED ORDER — OXYCODONE-ACETAMINOPHEN 5-325 MG PO TABS: 2.0000 | ORAL_TABLET | ORAL | Status: DC | PRN

## 2015-12-02 MED ORDER — PRENATAL MULTIVITAMIN CH
1.0000 | ORAL_TABLET | Freq: Every day | ORAL | Status: DC
  Administered 2015-12-02 – 2015-12-03 (×2): 1 via ORAL
  Filled 2015-12-02 (×2): qty 1

## 2015-12-02 MED ORDER — ACETAMINOPHEN 325 MG PO TABS: 650.0000 mg | ORAL_TABLET | ORAL | Status: DC | PRN

## 2015-12-02 MED ORDER — ZOLPIDEM TARTRATE 5 MG PO TABS: 5.0000 mg | ORAL_TABLET | Freq: Every evening | ORAL | Status: DC | PRN

## 2015-12-02 MED ORDER — TETANUS-DIPHTH-ACELL PERTUSSIS 5-2.5-18.5 LF-MCG/0.5 IM SUSP: 0.5000 mL | Freq: Once | INTRAMUSCULAR | Status: DC

## 2015-12-02 MED ORDER — LACTATED RINGERS IV SOLN: INTRAVENOUS | Status: DC

## 2015-12-02 MED ORDER — OXYTOCIN BOLUS FROM INFUSION: 500.0000 mL | INTRAVENOUS | Status: DC

## 2015-12-02 MED ORDER — LACTATED RINGERS IV SOLN
500.0000 mL | INTRAVENOUS | Status: DC | PRN
Start: 2015-12-02 — End: 2015-12-02

## 2015-12-02 MED ORDER — ONDANSETRON HCL 4 MG PO TABS: 4.0000 mg | ORAL_TABLET | ORAL | Status: DC | PRN

## 2015-12-02 MED ORDER — SENNOSIDES-DOCUSATE SODIUM 8.6-50 MG PO TABS
2.0000 | ORAL_TABLET | ORAL | Status: DC
  Administered 2015-12-02: 2 via ORAL
  Filled 2015-12-02 (×2): qty 2

## 2015-12-02 MED ORDER — OXYCODONE-ACETAMINOPHEN 5-325 MG PO TABS: 1.0000 | ORAL_TABLET | ORAL | Status: DC | PRN

## 2015-12-02 MED ORDER — SIMETHICONE 80 MG PO CHEW
80.0000 mg | CHEWABLE_TABLET | ORAL | Status: DC | PRN
Start: 2015-12-02 — End: 2015-12-04

## 2015-12-02 NOTE — Lactation Note (Signed)
This note was copied from a baby's chart. Lactation Consultation Note  Patient Name: Valerie Huber [id number] Date: 12/02/2015 Reason for consult: Initial assessment  Initial visit at 15 hours of life. Mom is a P2 who nursed her 1st child for 2 weeks, but quit b/c of nipple pain (that child is now 32 yo). Mom says that "Valerie Huber" has been nursing well, so far. Mom made aware of O/P services, breastfeeding support groups, community resources, and our phone # for post-discharge questions.   I asked Mom re: the last time she smoked pot. She said she took "1-2 tokes" about 1 week ago b/c of "pain." When I informed Mom that smoking marijuana may be more detrimental during breastfeeding than during pregnancy, Mom stated that she did not plan to smoke pot anymore.  Valerie Hughs [facility] 12/02/2015, 4:47 PM

## 2015-12-02 NOTE — Anesthesia Postprocedure Evaluation (Signed)
Anesthesia Post Note  Patient: Valerie Huber  Procedure(s) Performed: * No procedures listed *  Patient location during evaluation: Mother Baby Anesthesia Type: Epidural Level of consciousness: awake, awake and alert, oriented and patient cooperative Pain management: pain level controlled Vital Signs Assessment: post-procedure vital signs reviewed and stable Respiratory status: spontaneous breathing, nonlabored ventilation and respiratory function stable Cardiovascular status: stable Postop Assessment: no headache, no backache, patient able to bend at knees and no signs of nausea or vomiting Anesthetic complications: no     Last Vitals:  Filed Vitals:   12/02/15 0308 12/02/15 0400  BP: 116/81 100/58  Pulse: 101 101  Temp: 37.9 C 37 C  Resp:      Last Pain:  Filed Vitals:   12/02/15 0505  PainSc: 0-No pain   Pain Goal:                 Valerie Huber L

## 2015-12-03 DIAGNOSIS — O99824 Streptococcus B carrier state complicating childbirth: Secondary | ICD-10-CM

## 2015-12-03 DIAGNOSIS — O99354 Diseases of the nervous system complicating childbirth: Secondary | ICD-10-CM

## 2015-12-03 DIAGNOSIS — Z3A38 38 weeks gestation of pregnancy: Secondary | ICD-10-CM

## 2015-12-03 DIAGNOSIS — Z87891 Personal history of nicotine dependence: Secondary | ICD-10-CM

## 2015-12-03 DIAGNOSIS — Z8711 Personal history of peptic ulcer disease: Secondary | ICD-10-CM

## 2015-12-03 DIAGNOSIS — O9952 Diseases of the respiratory system complicating childbirth: Secondary | ICD-10-CM

## 2015-12-03 LAB — HIV ANTIBODY (ROUTINE TESTING W REFLEX): HIV Screen 4th Generation wRfx: NONREACTIVE

## 2015-12-03 LAB — RPR: RPR Ser Ql: NONREACTIVE

## 2015-12-03 NOTE — Progress Notes (Signed)
Post Partum Day 1 Subjective: Eating, drinking, voiding, ambulating well.  +flatus.  Lochia and pain wnl.  Denies dizziness, lightheadedness, or sob. No complaints.   Objective: Blood pressure 112/74, pulse 77, temperature 98.2 F (36.8 C), temperature source Oral, resp. rate 18, height 5\' 4"  (1.626 m), weight 78.926 kg (174 lb), last menstrual period 02/12/2015, SpO2 99 %, unknown if currently breastfeeding.  Physical Exam:  General: alert, cooperative and no distress Lochia: appropriate Uterine Fundus: firm Incision: n/a DVT Evaluation: No evidence of DVT seen on physical exam. Negative Homan's sign. No cords or calf tenderness. No significant calf/ankle edema.    Recent Labs  12/01/15 2245  HGB 10.8*  HCT 31.6*    Assessment/Plan: Plan for d/c tomorrow, GBS tx inadequate SW consult d/t possible depression as noted during pnc, +THC, insufficient pnc Bottlefeeding IUD for contraception OP circ   LOS: 2 days   Tawnya Crook 12/03/2015, 10:42 AM

## 2015-12-03 NOTE — Progress Notes (Signed)
CSW acknowledged consult for MOB.  CSW attempted to meet with MOB on this date, however MOB was asleep.  CSW will attempt to meet with MOB again at a later time.

## 2015-12-03 NOTE — Progress Notes (Signed)
Post Partum Day Day 1  Subjective:  Valerie Huber is a 30 y.o. [id number] [redacted]w[redacted]d s/p SVD.  No acute events overnight.  Pt denies problems with ambulating, voiding or po intake.  She denies nausea or vomiting.  Pain is well controlled.  She has had flatus. She has not had bowel movement.  Lochia Moderate.  Plan for birth control is IUD.  Method of Feeding: Bottle  Objective: BP 112/74 mmHg  Pulse 77  Temp(Src) 98.2 F (36.8 C) (Oral)  Resp 18  Ht 5\' 4"  (1.626 m)  Wt 78.926 kg (174 lb)  BMI 29.85 kg/m2  SpO2 99%  LMP 02/12/2015 (Approximate)  Breastfeeding? Unknown  Physical Exam:  General: alert, cooperative and no distress Lochia:normal flow Chest: CTAB Heart: RRR no m/r/g Abdomen: soft, nontender, fundus firm  Uterine Fundus: firm, minimally tender to palpation DVT Evaluation: No evidence of DVT seen on physical exam. Negative Homan's sign.  Extremities: trace edema   Recent Labs  12/01/15 2245  HGB 10.8*  HCT 31.6*    Assessment/Plan:  ASSESSMENT: Valerie Huber is a 30 y.o. [id number] [redacted]w[redacted]d ppd #1 s/p NSVD doing well.  Discussed with midwife the need for social work consult due to suspected depression during pregnancy.  GBS treatment was inadequate. Plan for discharge tomorrow.   Patient plans to bottle feed baby, outpatient circumcision, and  IUD for contraception.     LOS: 2 days   Salomon Mast 12/03/2015,  0930

## 2015-12-04 MED ORDER — SENNOSIDES-DOCUSATE SODIUM 8.6-50 MG PO TABS: 2.0000 | ORAL_TABLET | ORAL | 4 refills | Status: DC

## 2015-12-04 NOTE — Discharge Summary (Signed)
OB Discharge Summary  Patient Name: Valerie Huber DOB: 1985-09-17 MRN: [id number]  Date of admission: 12/01/2015 Delivering MD: Sela Hilding   Date of discharge: 12/04/2015  Admitting diagnosis: 38w water broke, ctx every 3 min Intrauterine pregnancy: [redacted]w[redacted]d     Secondary diagnosis:Active Problems:   Active labor   Active labor at term  Additional problems:none     Discharge diagnosis: Term Pregnancy Delivered                                                                     Post partum procedures:n/a  Augmentation: n/a  Complications: None  Hospital course:  Onset of Labor With Vaginal Delivery     30 y.o. yo [id number] at [redacted]w[redacted]d was admitted in Active Labor on 12/01/2015. Patient had an uncomplicated labor course as follows:  Membrane Rupture Time/Date: 9:19 PM ,12/01/2015   Intrapartum Procedures: Episiotomy: None [1]                                         Lacerations:  None [1]  Patient had a delivery of a Viable infant. 12/02/2015  Information for the patient's newborn:  Evi, Pollok [id number]  Delivery Method: Vag-Spont    Pateint had an uncomplicated postpartum course.  She is ambulating, tolerating a regular diet, passing flatus, and urinating well. Patient is discharged home in stable condition on 12/04/15.    Physical exam Vitals:   12/02/15 1700 12/03/15 0549 12/03/15 1836 12/04/15 0625  BP: 116/77 112/74 109/84 117/77  Pulse: 91 77 (!) 105 72  Resp: 18 18 18 18   Temp: 98 F (36.7 C) 98.2 F (36.8 C) 99.3 F (37.4 C) 97.8 F (36.6 C)  TempSrc: Oral Oral Oral   SpO2:      Weight:      Height:       General: alert, cooperative and no distress Lochia: appropriate Uterine Fundus: firm Incision: N/A DVT Evaluation: No evidence of DVT seen on physical exam. Labs: Lab Results  Component Value Date   WBC 11.2 (H) 12/01/2015   HGB 10.8 (L) 12/01/2015   HCT 31.6 (L) 12/01/2015   MCV 91.3 12/01/2015   PLT 237 12/01/2015   CMP  Latest Ref Rng & Units 12/31/2014  Glucose 65 - 99 mg/dL 90  BUN 6 - 20 mg/dL 12  Creatinine 0.57 - 1.00 mg/dL 0.72  Sodium 134 - 144 mmol/L 144  Potassium 3.5 - 5.2 mmol/L 4.2  Chloride 97 - 108 mmol/L 105  CO2 18 - 29 mmol/L 22  Calcium 8.7 - 10.2 mg/dL 9.4  Total Protein 6.0 - 8.5 g/dL 7.1  Total Bilirubin 0.0 - 1.2 mg/dL <0.2  Alkaline Phos 39 - 117 IU/L 43  AST 0 - 40 IU/L 12  ALT 0 - 32 IU/L 15    Discharge instruction: per After Visit Summary and "Baby and Me Booklet".  After Visit Meds:    Medication List    STOP taking these medications   acetaminophen 500 MG tablet Commonly known as:  TYLENOL     TAKE these medications   EPINEPHrine 0.3 mg/0.3 mL Soaj injection Commonly known as:  EPI-PEN Inject 0.3 mg into the muscle once. Reported on 11/23/2015   prenatal vitamin w/FE, FA 27-1 MG Tabs tablet Take 1 tablet by mouth daily at 12 noon.   senna-docusate 8.6-50 MG tablet Commonly known as:  Senokot-S Take 2 tablets by mouth daily.       Diet: routine diet  Activity: Advance as tolerated. Pelvic rest for 6 weeks.   Outpatient follow up:6 weeks Follow up Appt:No future appointments. Follow up visit: No Follow-up on file.  Postpartum contraception: IUD Mirena  Newborn Data: Live born female  Birth Weight: 6 lb 12.5 oz (3075 g) APGAR: 8, 9  Baby Feeding: Bottle Disposition:home with mother   12/04/2015 Koren Shiver, CNM

## 2016-01-11 ENCOUNTER — Ambulatory Visit: Payer: Medicaid Other | Admitting: Women's Health

## 2016-01-26 ENCOUNTER — Emergency Department (HOSPITAL_COMMUNITY): Payer: Medicaid Other

## 2016-01-26 ENCOUNTER — Encounter (HOSPITAL_COMMUNITY): Payer: Self-pay | Admitting: Emergency Medicine

## 2016-01-26 ENCOUNTER — Emergency Department (HOSPITAL_COMMUNITY)
Admission: EM | Admit: 2016-01-26 | Discharge: 2016-01-26 | Disposition: A | Discharge status: Home / Self Care | Payer: Medicaid Other | Attending: Emergency Medicine | Admitting: Emergency Medicine

## 2016-01-26 DIAGNOSIS — Y929 Unspecified place or not applicable: Secondary | ICD-10-CM | POA: Insufficient documentation

## 2016-01-26 DIAGNOSIS — W19XXXA Unspecified fall, initial encounter: Secondary | ICD-10-CM

## 2016-01-26 DIAGNOSIS — R0602 Shortness of breath: Secondary | ICD-10-CM | POA: Diagnosis not present

## 2016-01-26 DIAGNOSIS — Y939 Activity, unspecified: Secondary | ICD-10-CM | POA: Diagnosis not present

## 2016-01-26 DIAGNOSIS — S299XXA Unspecified injury of thorax, initial encounter: Secondary | ICD-10-CM | POA: Diagnosis present

## 2016-01-26 DIAGNOSIS — S20229A Contusion of unspecified back wall of thorax, initial encounter: Secondary | ICD-10-CM

## 2016-01-26 DIAGNOSIS — J45909 Unspecified asthma, uncomplicated: Secondary | ICD-10-CM | POA: Insufficient documentation

## 2016-01-26 DIAGNOSIS — T148XXA Other injury of unspecified body region, initial encounter: Secondary | ICD-10-CM

## 2016-01-26 DIAGNOSIS — Y999 Unspecified external cause status: Secondary | ICD-10-CM | POA: Diagnosis not present

## 2016-01-26 DIAGNOSIS — W11XXXA Fall on and from ladder, initial encounter: Secondary | ICD-10-CM | POA: Diagnosis not present

## 2016-01-26 DIAGNOSIS — Z87891 Personal history of nicotine dependence: Secondary | ICD-10-CM | POA: Diagnosis not present

## 2016-01-26 MED ORDER — HYDROCODONE-ACETAMINOPHEN 5-325 MG PO TABS: 2.0000 | ORAL_TABLET | ORAL | 0 refills | Status: DC | PRN

## 2016-01-26 MED ORDER — METHOCARBAMOL 500 MG PO TABS: 500.0000 mg | ORAL_TABLET | Freq: Two times a day (BID) | ORAL | 0 refills | Status: DC

## 2016-01-26 NOTE — ED Triage Notes (Signed)
Pt was painting fell about 5 feet off a ladder, knocking the breath out of her, complaining of mid back pain

## 2016-01-26 NOTE — ED Provider Notes (Signed)
[facility] DEPT Provider Note   CSN: [id number] Arrival date & time: 01/26/16  1506     History   Chief Complaint Chief Complaint  Patient presents with  . Fall    HPI Valerie Huber is a 30 y.o. female.  The history is provided by the patient. No language interpreter was used.  Fall  This is a new problem. The problem occurs constantly. The problem has not changed since onset.Associated symptoms include shortness of breath. Pertinent negatives include no chest pain, no abdominal pain and no headaches. Nothing aggravates the symptoms. Nothing relieves the symptoms. She has tried nothing for the symptoms. The treatment provided no relief.  Pt fell off of a ladder yesterday.  Pt reports she hit on her bottom.  Pt complains of pain in her low back.  No impact of head. No loss of consciousness. No heel impact  Past Medical History:  Diagnosis Date  . Asthma   . Endometriosis   . Irregular periods 12/31/2014  . Pelvic pain in female 12/31/2014  . Pregnant 04/14/2015  . Second degree uterine prolapse 12/31/2014  . Seizures ([place])   . Vaginal Pap smear, abnormal     Patient Active Problem List   Diagnosis Date Noted  . Active labor at term 12/02/2015  . Active labor 12/01/2015  . Decreased appetite 11/16/2015  . Insufficient prenatal care 10/25/2015  . Marijuana use 07/05/2015  . Supervision of normal pregnancy 05/10/2015  . Moderate dysplasia of cervix (CIN II)  at endocervical margin of LEEP specimen 01/22/2015  . Pelvic pain in female 12/31/2014  . Endometriosis determined by laparoscopy 12/31/2014  . Second degree uterine prolapse 12/31/2014  . Irregular periods 12/31/2014  . 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 Hematemesis with nausea     Past Surgical History:  Procedure Laterality Date  . ESOPHAGOGASTRODUODENOSCOPY N/A 12/10/2014   SLF: hematoemesis due to moderate erosive gastritis, duoedentitis, and duodenal ulcer  . LEEP    . surgery for  endometriosis  2005    OB History    Gravida Para Term Preterm AB Living   3 2 2   1 2    SAB TAB Ectopic Multiple Live Births     1   0 2       Home Medications    Prior to Admission medications   Medication Sig Start Date End Date Taking? Authorizing Provider  EPINEPHrine (EPI-PEN) 0.3 mg/0.3 mL SOAJ injection Inject 0.3 mg into the muscle once. Reported on 11/23/2015    Historical Provider, MD  HYDROcodone-acetaminophen (NORCO/VICODIN) 5-325 MG tablet Take 2 tablets by mouth every 4 (four) hours as needed. 01/26/16   Fransico Meadow, PA-C  methocarbamol (ROBAXIN) 500 MG tablet Take 1 tablet (500 mg total) by mouth 2 (two) times daily. 01/26/16   Fransico Meadow, PA-C  prenatal vitamin w/FE, FA (PRENATAL 1 + 1) 27-1 MG TABS tablet Take 1 tablet by mouth daily at 12 noon. 04/14/15   Estill Dooms, NP  senna-docusate (SENOKOT-S) 8.6-50 MG tablet Take 2 tablets by mouth daily. 12/04/15   Keitha Butte, CNM    Family History Family History  Problem Relation Age of Onset  . Cancer Mother     kidney   . Asthma Mother   . Hypertension Mother   . Cancer Paternal Grandmother   . Colon cancer Neg Hx     Social History Social History  Substance Use Topics  . Smoking status: Former Smoker    Packs/day: 0.25  Years: 14.00    Types: Cigarettes  . Smokeless tobacco: Never Used     Comment: QUIT SMOKING ON JULY 11th. STATES WOULD ONLY HAVE A FEW CIGARETTES A WEEK PRIOR TO THIS.   [name] Alcohol use No     Comment: occ.     Allergies   Flexeril [cyclobenzaprine] and Ibuprofen   Review of Systems Review of Systems  Respiratory: Positive for shortness of breath.   Cardiovascular: Negative for chest pain.  Gastrointestinal: Negative for abdominal pain.  Neurological: Negative for headaches.  All other systems reviewed and are negative.    Physical Exam Updated Vital Signs BP 121/88   Pulse 80   Temp 100.2 F (37.9 C) (Oral)   Resp 18   Ht 5\' 4"  (1.626 m)   Wt 72.6 kg    LMP 01/17/2016   SpO2 100%   BMI 27.46 kg/m   Physical Exam  Constitutional: She is oriented to person, place, and time. She appears well-developed and well-nourished. No distress.  HENT:  Head: Normocephalic and atraumatic.  Eyes: Conjunctivae are normal.  Neck: Neck supple.  Cardiovascular: Normal rate and regular rhythm.   No murmur heard. Pulmonary/Chest: Effort normal and breath sounds normal. No respiratory distress.  Abdominal: Soft. There is no tenderness.  Musculoskeletal: She exhibits no edema.  Neurological: She is alert and oriented to person, place, and time.  Skin: Skin is warm and dry.  Psychiatric: She has a normal mood and affect.  Nursing note and vitals reviewed.    ED Treatments / Results  Labs (all labs ordered are listed, but only abnormal results are displayed) Labs Reviewed - No data to display  EKG  EKG Interpretation None       Radiology Dg Lumbar Spine Complete  Result Date: 01/26/2016 CLINICAL DATA:  Lumbar pain since a fall off a 5 foot ladder. EXAM: LUMBAR SPINE - COMPLETE 4+ VIEW COMPARISON:  None. FINDINGS: There is no evidence of lumbar spine fracture. Alignment is normal. Intervertebral disc spaces are maintained. IMPRESSION: Negative. Electronically Signed   By: Kathreen Devoid   On: 01/26/2016 17:34    Procedures Procedures (including critical care time)  Medications Ordered in ED Medications - No data to display   Initial Impression / Assessment and Plan / ED Course  I have reviewed the triage vital signs and the nursing notes.  Pertinent labs & imaging results that were available during my care of the patient were reviewed by me and considered in my medical decision making (see chart for details).  Clinical Course    Pt counseled on pain.   Pt advised to follow up with her MD for recheck if pain persist  Final Clinical Impressions(s) / ED Diagnoses   Final diagnoses:  Fall, initial encounter  Contusion, back, unspecified  laterality, initial encounter  Contusion    New Prescriptions Discharge Medication List as of 01/26/2016  5:59 PM    START taking these medications   Details  HYDROcodone-acetaminophen (NORCO/VICODIN) 5-325 MG tablet Take 2 tablets by mouth every 4 (four) hours as needed., Starting Thu 01/26/2016, Print    methocarbamol (ROBAXIN) 500 MG tablet Take 1 tablet (500 mg total) by mouth 2 (two) times daily., Starting Thu 01/26/2016, Print      An After Visit Summary was printed and given to the patient.   Hollace Kinnier Kenton Vale, PA-C 01/26/16 1851    Virgel Manifold, MD 01/29/16 [phone]

## 2016-01-26 NOTE — ED Notes (Signed)
Pt seen and evaluated by EDPa for initial assessment. 

## 2016-03-08 ENCOUNTER — Ambulatory Visit (INDEPENDENT_AMBULATORY_CARE_PROVIDER_SITE_OTHER): Payer: Medicaid Other | Admitting: *Deleted

## 2016-03-08 ENCOUNTER — Encounter: Payer: Self-pay | Admitting: Advanced Practice Midwife

## 2016-03-08 ENCOUNTER — Encounter: Payer: Self-pay | Admitting: *Deleted

## 2016-03-08 ENCOUNTER — Ambulatory Visit (INDEPENDENT_AMBULATORY_CARE_PROVIDER_SITE_OTHER): Payer: Medicaid Other | Admitting: Advanced Practice Midwife

## 2016-03-08 VITALS — BP 110/80 | HR 78 | Ht 64.0 in | Wt 169.0 lb

## 2016-03-08 DIAGNOSIS — Z3202 Encounter for pregnancy test, result negative: Secondary | ICD-10-CM

## 2016-03-08 DIAGNOSIS — Z3009 Encounter for other general counseling and advice on contraception: Secondary | ICD-10-CM

## 2016-03-08 DIAGNOSIS — Z308 Encounter for other contraceptive management: Secondary | ICD-10-CM

## 2016-03-08 DIAGNOSIS — Z30013 Encounter for initial prescription of injectable contraceptive: Secondary | ICD-10-CM | POA: Diagnosis not present

## 2016-03-08 LAB — POCT URINE PREGNANCY: Preg Test, Ur: NEGATIVE

## 2016-03-08 MED ORDER — MEDROXYPROGESTERONE ACETATE 150 MG/ML IM SUSP
150.0000 mg | Freq: Once | INTRAMUSCULAR | Status: AC
  Administered 2016-03-08: 150 mg via INTRAMUSCULAR

## 2016-03-08 MED ORDER — MEDROXYPROGESTERONE ACETATE 150 MG/ML IM SUSP: 150.0000 mg | INTRAMUSCULAR | 3 refills | Status: DC

## 2016-03-08 NOTE — Progress Notes (Signed)
Pt here for Depo. Pt tolerated shot well. Return in 12 weeks for next shot. JSY 

## 2016-03-08 NOTE — Progress Notes (Signed)
[facility] Visit  Patient name: Valerie Huber MRN [id number]  Date of birth: 1985/09/26  CC & HPI:  Valerie Huber is a 30 y.o. African American female presenting today for depo.  LMP 10/17 and hasn't had sex since then. Has been on depo before.   Pertinent History Reviewed:  Medical & Surgical Hx:   Past Medical History:  Diagnosis Date  . Asthma   . Endometriosis   . Irregular periods 12/31/2014  . Pelvic pain in female 12/31/2014  . Pregnant 04/14/2015  . Second degree uterine prolapse 12/31/2014  . Seizures ([place])   . Vaginal Pap smear, abnormal    Past Surgical History:  Procedure Laterality Date  . ESOPHAGOGASTRODUODENOSCOPY N/A 12/10/2014   SLF: hematoemesis due to moderate erosive gastritis, duoedentitis, and duodenal ulcer  . LEEP    . surgery for endometriosis  2005   Family History  Problem Relation Age of Onset  . Cancer Mother     kidney   . Asthma Mother   . Hypertension Mother   . Cancer Paternal Grandmother   . Colon cancer Neg Hx     Current Outpatient Prescriptions:  .  EPINEPHrine (EPI-PEN) 0.3 mg/0.3 mL SOAJ injection, Inject 0.3 mg into the muscle once. Reported on 11/23/2015, Disp: , Rfl:  .  medroxyPROGESTERone (DEPO-PROVERA) 150 MG/ML injection, Inject 1 mL (150 mg total) into the muscle every 3 (three) months., Disp: 1 mL, Rfl: 3 Social History: Reviewed -  reports that she has quit smoking. Her smoking use included Cigarettes. She has a 3.50 pack-year smoking history. She has never used smokeless tobacco.  Review of Systems:   Constitutional: Negative for fever and chills Eyes: Negative for visual disturbances Respiratory: Negative for shortness of breath, dyspnea Cardiovascular: Negative for chest pain or palpitations  Gastrointestinal: Negative for vomiting, diarrhea and constipation; no abdominal pain Genitourinary: Negative for dysuria and urgency, vaginal irritation or itching Musculoskeletal: Negative for back pain, joint  pain, myalgias  Neurological: Negative for dizziness and headaches    Objective Findings:    Physical Examination: General appearance - well appearing, and in no distress Mental status - alert, oriented to person, place, and time Chest:  Normal respiratory effort Heart - normal rate and regular rhythm Musculoskeletal:  Normal range of motion without pain Extremities:  No edema    No results found for this or any previous visit (from the past 24 hour(s)).    Assessment & Plan:  A:   Contraception management P:     Return for will come back later for depo injection.  CRESENZO-DISHMAN,Torian Quintero CNM 03/08/2016 3:33 PM

## 2016-04-20 ENCOUNTER — Encounter (HOSPITAL_COMMUNITY): Payer: Self-pay | Admitting: Emergency Medicine

## 2016-04-20 ENCOUNTER — Emergency Department (HOSPITAL_COMMUNITY): Payer: Medicaid Other

## 2016-04-20 ENCOUNTER — Emergency Department (HOSPITAL_COMMUNITY)
Admission: EM | Admit: 2016-04-20 | Discharge: 2016-04-20 | Disposition: A | Discharge status: Home Health | Payer: Medicaid Other | Attending: Emergency Medicine | Admitting: Emergency Medicine

## 2016-04-20 DIAGNOSIS — Y999 Unspecified external cause status: Secondary | ICD-10-CM | POA: Diagnosis not present

## 2016-04-20 DIAGNOSIS — Z87891 Personal history of nicotine dependence: Secondary | ICD-10-CM | POA: Diagnosis not present

## 2016-04-20 DIAGNOSIS — M549 Dorsalgia, unspecified: Secondary | ICD-10-CM | POA: Diagnosis not present

## 2016-04-20 DIAGNOSIS — Z79899 Other long term (current) drug therapy: Secondary | ICD-10-CM | POA: Insufficient documentation

## 2016-04-20 DIAGNOSIS — M25562 Pain in left knee: Secondary | ICD-10-CM | POA: Insufficient documentation

## 2016-04-20 DIAGNOSIS — M542 Cervicalgia: Secondary | ICD-10-CM | POA: Insufficient documentation

## 2016-04-20 DIAGNOSIS — J45909 Unspecified asthma, uncomplicated: Secondary | ICD-10-CM | POA: Insufficient documentation

## 2016-04-20 DIAGNOSIS — M25521 Pain in right elbow: Secondary | ICD-10-CM | POA: Diagnosis not present

## 2016-04-20 DIAGNOSIS — Y929 Unspecified place or not applicable: Secondary | ICD-10-CM | POA: Diagnosis not present

## 2016-04-20 DIAGNOSIS — Y939 Activity, unspecified: Secondary | ICD-10-CM | POA: Insufficient documentation

## 2016-04-20 DIAGNOSIS — S199XXA Unspecified injury of neck, initial encounter: Secondary | ICD-10-CM | POA: Diagnosis present

## 2016-04-20 MED ORDER — HYDROCODONE-ACETAMINOPHEN 5-325 MG PO TABS
1.0000 | ORAL_TABLET | Freq: Once | ORAL | Status: AC
  Administered 2016-04-20: 1 via ORAL
  Filled 2016-04-20: qty 1

## 2016-04-20 MED ORDER — HYDROCODONE-ACETAMINOPHEN 5-325 MG PO TABS: 1.0000 | ORAL_TABLET | Freq: Four times a day (QID) | ORAL | 0 refills | Status: DC | PRN

## 2016-04-20 NOTE — ED Notes (Signed)
Pt given pre-pack.  Prescription shredded.
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
## 2016-04-23 MED FILL — Hydrocodone-Acetaminophen Tab 5-325 MG: ORAL | Qty: 6 | Status: AC

## 2016-06-11 ENCOUNTER — Ambulatory Visit (INDEPENDENT_AMBULATORY_CARE_PROVIDER_SITE_OTHER): Payer: Medicaid Other

## 2016-06-11 ENCOUNTER — Encounter (INDEPENDENT_AMBULATORY_CARE_PROVIDER_SITE_OTHER): Payer: Self-pay

## 2016-06-11 VITALS — Wt 167.0 lb

## 2016-06-11 DIAGNOSIS — Z3202 Encounter for pregnancy test, result negative: Secondary | ICD-10-CM

## 2016-06-11 DIAGNOSIS — Z3042 Encounter for surveillance of injectable contraceptive: Secondary | ICD-10-CM

## 2016-06-11 LAB — POCT URINE PREGNANCY: Preg Test, Ur: NEGATIVE

## 2016-06-11 MED ORDER — MEDROXYPROGESTERONE ACETATE 150 MG/ML IM SUSP
150.0000 mg | Freq: Once | INTRAMUSCULAR | Status: AC
  Administered 2016-06-11: 150 mg via INTRAMUSCULAR

## 2016-06-11 NOTE — Progress Notes (Signed)
PT here for Depo Shot 150 mg IM given RT Deltoid. Pregnancy test negative. Will return 12 week for next. Tolerated well. Diona Fanti CMA

## 2016-07-02 ENCOUNTER — Ambulatory Visit: Payer: Medicaid Other | Admitting: Women's Health

## 2016-09-03 ENCOUNTER — Ambulatory Visit: Payer: Medicaid Other

## 2016-09-05 ENCOUNTER — Ambulatory Visit: Payer: Medicaid Other

## 2016-10-12 ENCOUNTER — Emergency Department (HOSPITAL_COMMUNITY): Payer: Medicaid Other

## 2016-10-12 ENCOUNTER — Encounter (HOSPITAL_COMMUNITY): Payer: Self-pay | Admitting: Emergency Medicine

## 2016-10-12 ENCOUNTER — Emergency Department (HOSPITAL_COMMUNITY)
Admission: EM | Admit: 2016-10-12 | Discharge: 2016-10-12 | Disposition: A | Discharge status: Home / Self Care | Payer: Medicaid Other | Attending: Emergency Medicine | Admitting: Emergency Medicine

## 2016-10-12 DIAGNOSIS — J45909 Unspecified asthma, uncomplicated: Secondary | ICD-10-CM | POA: Diagnosis not present

## 2016-10-12 DIAGNOSIS — Z87891 Personal history of nicotine dependence: Secondary | ICD-10-CM | POA: Diagnosis not present

## 2016-10-12 DIAGNOSIS — S93401A Sprain of unspecified ligament of right ankle, initial encounter: Secondary | ICD-10-CM | POA: Diagnosis not present

## 2016-10-12 DIAGNOSIS — W1789XA Other fall from one level to another, initial encounter: Secondary | ICD-10-CM | POA: Diagnosis not present

## 2016-10-12 DIAGNOSIS — Y929 Unspecified place or not applicable: Secondary | ICD-10-CM | POA: Insufficient documentation

## 2016-10-12 DIAGNOSIS — S99911A Unspecified injury of right ankle, initial encounter: Secondary | ICD-10-CM | POA: Diagnosis present

## 2016-10-12 DIAGNOSIS — Y999 Unspecified external cause status: Secondary | ICD-10-CM | POA: Diagnosis not present

## 2016-10-12 DIAGNOSIS — Y9339 Activity, other involving climbing, rappelling and jumping off: Secondary | ICD-10-CM | POA: Insufficient documentation

## 2016-10-12 MED ORDER — ACETAMINOPHEN 500 MG PO TABS
1000.0000 mg | ORAL_TABLET | Freq: Once | ORAL | Status: AC
  Administered 2016-10-12: 1000 mg via ORAL
  Filled 2016-10-12: qty 2

## 2016-10-12 NOTE — ED Provider Notes (Signed)
[facility] DEPT Provider Note   CSN: [id number] Arrival date & time: 10/12/16  1447     History   Chief Complaint Chief Complaint  Patient presents with  . Ankle Pain    HPI Valerie Huber is a 31 y.o. female.  HPI  The patient is a 31 year old female who presents with a complaint of right ankle pain after jumping off of a swing yesterday. She landed on her ankle in an awkward manner and caused acute onset of pain. The pain has been persistent since that time, worse with ambulation though she has been able to bear weight. She has no numbness of the foot, she has tenderness over the lateral ankle. Her symptoms of been persistent. She is allergic to anti-inflammatories.  Past Medical History:  Diagnosis Date  . Asthma   . Endometriosis   . Irregular periods 12/31/2014  . Pelvic pain in female 12/31/2014  . Pregnant 04/14/2015  . Second degree uterine prolapse 12/31/2014  . Seizures ([place])   . Vaginal Pap smear, abnormal     Patient Active Problem List   Diagnosis Date Noted  . Active labor at term 12/02/2015  . Active labor 12/01/2015  . Decreased appetite 11/16/2015  . Insufficient prenatal care 10/25/2015  . Marijuana use 07/05/2015  . Moderate dysplasia of cervix (CIN II)  at endocervical margin of LEEP specimen 01/22/2015  . Pelvic pain in female 12/31/2014  . Endometriosis determined by laparoscopy 12/31/2014  . Second degree uterine prolapse 12/31/2014  . Irregular periods 12/31/2014  . 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 Hematemesis with nausea     Past Surgical History:  Procedure Laterality Date  . ESOPHAGOGASTRODUODENOSCOPY N/A 12/10/2014   SLF: hematoemesis due to moderate erosive gastritis, duoedentitis, and duodenal ulcer  . LEEP    . surgery for endometriosis  2005    OB History    Gravida Para Term Preterm AB Living   3 2 2   1 2    SAB TAB Ectopic Multiple Live Births     1   0 2       Home Medications    Prior to  Admission medications   Medication Sig Start Date End Date Taking? Authorizing Provider  EPINEPHrine (EPI-PEN) 0.3 mg/0.3 mL SOAJ injection Inject 0.3 mg into the muscle once. Reported on 11/23/2015    [provider]  medroxyPROGESTERone (DEPO-PROVERA) 150 MG/ML injection Inject 1 mL (150 mg total) into the muscle every 3 (three) months. 03/08/16   Christin Fudge, CNM    Family History Family History  Problem Relation Age of Onset  . Cancer Mother        kidney   . Asthma Mother   . Hypertension Mother   . Cancer Paternal Grandmother   . Colon cancer Neg Hx     Social History Social History  Substance Use Topics  . Smoking status: Former Smoker    Packs/day: 0.25    Years: 14.00    Types: Cigarettes  . Smokeless tobacco: Never Used     Comment: QUIT SMOKING ON JULY 11th. STATES WOULD ONLY HAVE A FEW CIGARETTES A WEEK PRIOR TO THIS.   [name] Alcohol use No     Comment: occ.     Allergies   Flexeril [cyclobenzaprine] and Ibuprofen   Review of Systems Review of Systems  Musculoskeletal: Positive for arthralgias.  Neurological: Negative for numbness.     Physical Exam Updated Vital Signs BP 123/88 (BP Location: Right Arm)   Pulse 99  Temp 99.6 F (37.6 C) (Oral)   Resp 18   Ht 5\' 4"  (1.626 m)   Wt 81.6 kg (180 lb)   LMP 10/05/2016 (Approximate)   SpO2 100%   Breastfeeding? No   BMI 30.90 kg/m   Physical Exam  Constitutional: She appears well-developed and well-nourished. No distress.  HENT:  Head: Normocephalic and atraumatic.  Eyes: Conjunctivae are normal. No scleral icterus.  Cardiovascular: Normal rate, regular rhythm and intact distal pulses.   Pulmonary/Chest: Effort normal and breath sounds normal.  Musculoskeletal: She exhibits tenderness ( Tender to palpation over the right lateral malleolus and superior approximately 4 inches. Tenderness just distal to the malleolus and mild tenderness over the base of the fifth metatarsal.). She  exhibits no edema.  No swelling of the foot or ankle, no obvious bruising.  Neurological: She is alert.  Normal sensation to light touch of all of the toes.  Skin: Skin is warm and dry. No rash noted. She is not diaphoretic.  No abrasions lacerations wounds or bruising.  Nursing note and vitals reviewed.    ED Treatments / Results  Labs (all labs ordered are listed, but only abnormal results are displayed) Labs Reviewed - No data to display   Radiology Dg Ankle Complete Right  Result Date: 10/12/2016 CLINICAL DATA:  Lateral ankle pain and swelling after jumping off a swing. EXAM: RIGHT ANKLE - COMPLETE 3+ VIEW COMPARISON:  09/24/2012 and 01/01/2014 ankle radiographs FINDINGS: There is mild soft tissue swelling over the lateral malleolus. Minimal anterior spurring off the tibial plafond. Intact ankle joint. No acute fracture or dislocation. Spurring is noted off the subtalar joint medially consistent with changes of coalition and osteoarthritis as before. IMPRESSION: 1. Soft tissue swelling over the lateral malleolus. 2. No acute osseous abnormality or joint dislocations. 3. 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 Electronically Signed   By: Ashley Royalty M.D.   On: 10/12/2016 15:56    Procedures Procedures (including critical care time)  Medications Ordered in ED Medications  acetaminophen (TYLENOL) tablet 1,000 mg (not administered)     Initial Impression / Assessment and Plan / ED Course  I have reviewed the triage vital signs and the nursing notes.  Pertinent labs & imaging results that were available during my care of the patient were reviewed by me and considered in my medical decision making (see chart for details).     Evaluated with x-ray to rule out fracture, otherwise the patient is well-appearing, anticipate discharge with supportive care including rice therapy. The patient  expressed her understanding and agreement to the plan  Xray neg  Final Clinical Impressions(s) / ED Diagnoses   Final diagnoses:  Sprain of right ankle, unspecified ligament, initial encounter    New Prescriptions New Prescriptions   No medications on file     Noemi Chapel, MD 10/12/16 1604

## 2016-10-12 NOTE — ED Triage Notes (Signed)
Pt reports swinging yesterday and jumping out of swing, landing wrong on right ankle.

## 2016-10-12 NOTE — ED Notes (Signed)
Applied ASO to patient's right foot. Patient states "he asked me if I wanted crutches but I don't think I need them." Patient refused crutches and wheelchair at discharge. Ambulated to exit with little difficulty and no assistance.

## 2016-10-12 NOTE — Discharge Instructions (Signed)

## 2017-03-03 ENCOUNTER — Emergency Department (HOSPITAL_COMMUNITY): Payer: Medicaid Other

## 2017-03-03 ENCOUNTER — Encounter (HOSPITAL_COMMUNITY): Payer: Self-pay | Admitting: Emergency Medicine

## 2017-03-03 ENCOUNTER — Emergency Department (HOSPITAL_COMMUNITY)
Admission: EM | Admit: 2017-03-03 | Discharge: 2017-03-03 | Disposition: A | Discharge status: Home / Self Care | Payer: Medicaid Other | Attending: Emergency Medicine | Admitting: Emergency Medicine

## 2017-03-03 DIAGNOSIS — G8929 Other chronic pain: Secondary | ICD-10-CM

## 2017-03-03 DIAGNOSIS — M25571 Pain in right ankle and joints of right foot: Secondary | ICD-10-CM | POA: Diagnosis not present

## 2017-03-03 DIAGNOSIS — Z79899 Other long term (current) drug therapy: Secondary | ICD-10-CM | POA: Diagnosis not present

## 2017-03-03 DIAGNOSIS — J45909 Unspecified asthma, uncomplicated: Secondary | ICD-10-CM | POA: Diagnosis not present

## 2017-03-03 DIAGNOSIS — Z87891 Personal history of nicotine dependence: Secondary | ICD-10-CM | POA: Diagnosis not present

## 2017-03-03 MED ORDER — TRAMADOL HCL 50 MG PO TABS: 50.0000 mg | ORAL_TABLET | Freq: Four times a day (QID) | ORAL | 0 refills | Status: DC | PRN

## 2017-03-03 NOTE — ED Triage Notes (Addendum)
Patient c/o right ankle pain  Since Thursday. Denies any new injury. Per patient injured ankle in past 3 different times. Denies any swelling. Patient reports taking tylenol for pain with no relief, last dose 1500mg  at 4:30pm. Patient using ASO at home with no relief.

## 2017-03-03 NOTE — Discharge Instructions (Signed)
Wear the ankle brace as needed.  Continue to elevate and apply ice packs on/off to your ankle.  Call one of the orthopedic providers listed to arrange a follow-up

## 2017-03-04 ENCOUNTER — Telehealth: Payer: Self-pay | Admitting: Orthopedic Surgery

## 2017-03-04 NOTE — Telephone Encounter (Signed)
Patient called and stated she went to ER regarding ankle pain.  She was seen there also in June for this ankle.  She said they told her that she has spurs in her ankle.  I told her that due to her insurance being Medicaid [place], she would need to speak to her PCP, [facility] so they would approve her to come here.  She said she would call them and have them give [place] approval to be seen.

## 2017-03-05 NOTE — ED Provider Notes (Signed)
[facility] EMERGENCY DEPARTMENT Provider Note   CSN: [id number] Arrival date & time: 03/03/17  1651     History   Chief Complaint Chief Complaint  Patient presents with  . Ankle Pain    HPI Valerie Huber is a 31 y.o. female.  HPI   Valerie Huber is a 31 y.o. female who presents to the Emergency Department complaining of right ankle pain for 3 days.  States she "twisted" the ankle recently and has injured the same ankle multiple times.  She has tried bracing , ice and ibuprofen without relief.  States the pain is constant, but worse with weight bearing.  She denies calf pain or swelling, discoloration, numbness and swelling to the ankle.   Past Medical History:  Diagnosis Date  . Asthma   . Endometriosis   . Irregular periods 12/31/2014  . Pelvic pain in female 12/31/2014  . Pregnant 04/14/2015  . Second degree uterine prolapse 12/31/2014  . Seizures ([place])   . Vaginal Pap smear, abnormal     Patient Active Problem List   Diagnosis Date Noted  . Active labor at term 12/02/2015  . Active labor 12/01/2015  . Decreased appetite 11/16/2015  . Insufficient prenatal care 10/25/2015  . Marijuana use 07/05/2015  . Moderate dysplasia of cervix (CIN II)  at endocervical margin of LEEP specimen 01/22/2015  . Pelvic pain in female 12/31/2014  . Endometriosis determined by laparoscopy 12/31/2014  . Second degree uterine prolapse 12/31/2014  . Irregular periods 12/31/2014  . 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 Hematemesis with nausea     Past Surgical History:  Procedure Laterality Date  . ESOPHAGOGASTRODUODENOSCOPY N/A 12/10/2014   SLF: hematoemesis due to moderate erosive gastritis, duoedentitis, and duodenal ulcer  . LEEP    . surgery for endometriosis  2005    OB History    Gravida Para Term Preterm AB Living   3 2 2   1 2    SAB TAB Ectopic Multiple Live Births     1   0 2       Home Medications    Prior to Admission medications     Medication Sig Start Date End Date Taking? Authorizing Provider  EPINEPHrine (EPI-PEN) 0.3 mg/0.3 mL SOAJ injection Inject 0.3 mg into the muscle once. Reported on 11/23/2015    [provider]  medroxyPROGESTERone (DEPO-PROVERA) 150 MG/ML injection Inject 1 mL (150 mg total) into the muscle every 3 (three) months. 03/08/16   Cresenzo-Dishmon, Joaquim Lai, CNM  traMADol (ULTRAM) 50 MG tablet Take 1 tablet (50 mg total) by mouth every 6 (six) hours as needed. 03/03/17   Kem Parkinson, PA-C    Family History Family History  Problem Relation Age of Onset  . Cancer Mother        kidney   . Asthma Mother   . Hypertension Mother   . Cancer Paternal Grandmother   . Colon cancer Neg Hx     Social History Social History  Substance Use Topics  . Smoking status: Former Smoker    Packs/day: 0.25    Years: 14.00    Types: Cigarettes  . Smokeless tobacco: Never Used     Comment: QUIT SMOKING ON JULY 11th. STATES WOULD ONLY HAVE A FEW CIGARETTES A WEEK PRIOR TO THIS.   [name] Alcohol use No     Comment: occ.     Allergies   Flexeril [cyclobenzaprine] and Ibuprofen   Review of Systems Review of Systems  Constitutional: Negative for chills and  fever.  Genitourinary: Negative for difficulty urinating and dysuria.  Musculoskeletal: Positive for arthralgias and joint swelling.  Skin: Negative for color change and wound.  All other systems reviewed and are negative.    Physical Exam Updated Vital Signs BP (!) 123/92 (BP Location: Right Arm)   Pulse 74   Temp 100 F (37.8 C) (Oral)   Resp 19   Ht 5\' 4"  (1.626 m)   Wt 79.4 kg (175 lb)   LMP 02/26/2017   SpO2 100%   BMI 30.04 kg/m   Physical Exam  Constitutional: She is oriented to person, place, and time. She appears well-developed and well-nourished. No distress.  HENT:  Head: Normocephalic and atraumatic.  Cardiovascular: Normal rate, regular rhythm and intact distal pulses.   Pulmonary/Chest: Effort normal and breath  sounds normal.  Musculoskeletal: She exhibits tenderness. She exhibits no edema.  Diffuse ttp of the lateral and anterior right ankle.  No erythema, edema, abrasion, bruising or bony deformity.  No proximal tenderness.  Neurological: She is alert and oriented to person, place, and time. No sensory deficit. She exhibits normal muscle tone. Coordination normal.  Skin: Skin is warm and dry. Capillary refill takes less than 2 seconds.  Nursing note and vitals reviewed.    ED Treatments / Results  Labs (all labs ordered are listed, but only abnormal results are displayed) Labs Reviewed - No data to display  EKG  EKG Interpretation None       Radiology No results found.   Procedures Procedures (including critical care time)  Medications Ordered in ED Medications - No data to display   Initial Impression / Assessment and Plan / ED Course  I have reviewed the triage vital signs and the nursing notes.  Pertinent labs & imaging results that were available during my care of the patient were reviewed by me and considered in my medical decision making (see chart for details).     Pt had XR of ankle on 10/12/16  XR reviewed.  No indication for additional imaging .  NV intact.  Compartments soft.    ASO applied by nursing.  Pain improves.  Referral given for orthopedic f/u.    Final Clinical Impressions(s) / ED Diagnoses   Final diagnoses:  Chronic pain of right ankle    New Prescriptions Discharge Medication List as of 03/03/2017  6:26 PM    START taking these medications   Details  traMADol (ULTRAM) 50 MG tablet Take 1 tablet (50 mg total) by mouth every 6 (six) hours as needed., Starting Sun 03/03/2017, Print         Volanda Mangine, PA-C 03/05/17 1956    Mesner, Corene Cornea, MD 03/11/17 1040

## 2017-03-06 ENCOUNTER — Ambulatory Visit: Payer: Medicaid Other | Admitting: Orthopedic Surgery

## 2017-03-11 ENCOUNTER — Telehealth: Payer: Self-pay | Admitting: Radiology

## 2017-03-11 ENCOUNTER — Ambulatory Visit (INDEPENDENT_AMBULATORY_CARE_PROVIDER_SITE_OTHER): Payer: Medicaid Other | Admitting: Orthopedic Surgery

## 2017-03-11 ENCOUNTER — Encounter: Payer: Self-pay | Admitting: Orthopedic Surgery

## 2017-03-11 VITALS — BP 130/90 | HR 69 | Ht 64.0 in | Wt 169.0 lb

## 2017-03-11 DIAGNOSIS — S93491A Sprain of other ligament of right ankle, initial encounter: Secondary | ICD-10-CM | POA: Diagnosis not present

## 2017-03-11 DIAGNOSIS — M62838 Other muscle spasm: Secondary | ICD-10-CM

## 2017-03-11 MED ORDER — DIAZEPAM 5 MG PO TABS: 5.0000 mg | ORAL_TABLET | Freq: Four times a day (QID) | ORAL | 0 refills | Status: DC | PRN

## 2017-03-11 MED ORDER — HYDROCODONE-ACETAMINOPHEN 5-325 MG PO TABS: 1.0000 | ORAL_TABLET | Freq: Four times a day (QID) | ORAL | 0 refills | Status: DC | PRN

## 2017-03-11 MED ORDER — PREDNISONE 10 MG PO TABS: 10.0000 mg | ORAL_TABLET | Freq: Three times a day (TID) | ORAL | 0 refills | Status: DC

## 2017-03-11 NOTE — Patient Instructions (Signed)
Take medications as ordered   Do not work until your pain improves

## 2017-03-11 NOTE — Telephone Encounter (Signed)
Pharmacy calling to clarify, new Medicaid regulations require notification when patient using diazepam and narcotics together, I will call pharmacy back, let me know about the diazepam, are you treating anxiety or treating muscle spasms? [phone]

## 2017-03-11 NOTE — Progress Notes (Signed)
NEW PATIENT OFFICE VISIT    Chief Complaint  Patient presents with  . Follow-up    ER follow up on right ankle    31 year old female presents with several injuries to her right ankle. Over the last year she's had multiple injuries to the ankle.  She's been to the ER several times most recent injury was June. However in the last 3 weeks she has noted intense pain in the lateral side of the leg and ankle behind the fibula and into the foot with painful dorsiflexion as well as plantar flexion. She describes as severe pain constant with constant aching and difficulty ambulating.  She has not had any rest and she tries to work and this requires a lot of standing.  She has worn an ankle brace. She has had x-rays which show soft tissue swelling over the lateral malleolus osteophytes in the medial subtalar joint with some arthrosis.  She presents for evaluation and treatment    Review of Systems  Constitutional: Negative for chills and fever.  Musculoskeletal: Negative for back pain.  Neurological: Negative for tingling and sensory change.     Past Medical History:  Diagnosis Date  . Asthma   . Endometriosis   . Irregular periods 12/31/2014  . Pelvic pain in female 12/31/2014  . Pregnant 04/14/2015  . Second degree uterine prolapse 12/31/2014  . Seizures ([place])   . Vaginal Pap smear, abnormal     Past Surgical History:  Procedure Laterality Date  . ESOPHAGOGASTRODUODENOSCOPY N/A 12/10/2014   SLF: hematoemesis due to moderate erosive gastritis, duoedentitis, and duodenal ulcer  . LEEP    . surgery for endometriosis  2005    Family History  Problem Relation Age of Onset  . Cancer Mother        kidney   . Asthma Mother   . Hypertension Mother   . Cancer Paternal Grandmother   . Colon cancer Neg Hx    Social History  Substance Use Topics  . Smoking status: Former Smoker    Packs/day: 0.25    Years: 14.00    Types: Cigarettes  . Smokeless tobacco: Never Used      Comment: QUIT SMOKING ON JULY 11th. STATES WOULD ONLY HAVE A FEW CIGARETTES A WEEK PRIOR TO THIS.   [name] Alcohol use No     Comment: occ.    @ALL @  No outpatient prescriptions have been marked as taking for the 03/11/17 encounter (Office Visit) with Carole Civil, MD.    BP 130/90   Pulse 69   Ht 5\' 4"  (1.626 m)   Wt 169 lb (76.7 kg)   LMP 02/26/2017   BMI 29.01 kg/m   Physical Exam  Constitutional: She is oriented to person, place, and time. She appears well-developed and well-nourished.  Neurological: She is alert and oriented to person, place, and time.  Psychiatric: She has a normal mood and affect. Judgment normal.  Vitals reviewed.   Ortho Exam   First most noticeable thing is that she has a severe limp favoring her injured right ankle. Right ankle is tender in the peroneal musculature with intense muscle spasm painful range of motion in all planes but especially passive plantar flexion inversion. Ankle joint is stable. Her muscle tone is severely increased with severe muscle spasm. Skin is intact. Sensation is normal. Pulses are good in the foot.    Meds ordered this encounter  Medications  . HYDROcodone-acetaminophen (NORCO) 5-325 MG tablet    Sig: Take 1 tablet by mouth  every 6 (six) hours as needed for moderate pain.    Dispense:  30 tablet    Refill:  0  . diazepam (VALIUM) 5 MG tablet    Sig: Take 1 tablet (5 mg total) by mouth every 6 (six) hours as needed for anxiety.    Dispense:  60 tablet    Refill:  0  . predniSONE (DELTASONE) 10 MG tablet    Sig: Take 1 tablet (10 mg total) by mouth 3 (three) times daily.    Dispense:  42 tablet    Refill:  0    Encounter Diagnoses  Name Primary?  . Sprain of anterior talofibular ligament of right ankle, initial encounter Yes  . Muscle spasm of right lower extremity      PLAN:  Recommend medications as noted. She should not work or walk on the ankle. I gave her prescription to get some crutches as  well  She'll follow-up in 2 weeks. We will need to consider possible lidocaine injection and casting.

## 2017-03-25 NOTE — Telephone Encounter (Signed)
Done as requested. Appointment completed.

## 2017-03-26 DIAGNOSIS — M62838 Other muscle spasm: Secondary | ICD-10-CM | POA: Diagnosis not present

## 2017-03-26 DIAGNOSIS — S93491A Sprain of other ligament of right ankle, initial encounter: Secondary | ICD-10-CM | POA: Diagnosis not present

## 2017-03-27 ENCOUNTER — Encounter: Payer: Self-pay | Admitting: Orthopedic Surgery

## 2017-03-27 ENCOUNTER — Telehealth: Payer: Self-pay | Admitting: Orthopedic Surgery

## 2017-03-27 ENCOUNTER — Ambulatory Visit (INDEPENDENT_AMBULATORY_CARE_PROVIDER_SITE_OTHER): Payer: Medicaid Other | Admitting: Orthopedic Surgery

## 2017-03-27 DIAGNOSIS — M62838 Other muscle spasm: Secondary | ICD-10-CM

## 2017-03-27 DIAGNOSIS — S93491A Sprain of other ligament of right ankle, initial encounter: Secondary | ICD-10-CM | POA: Diagnosis not present

## 2017-03-27 MED ORDER — HYDROCODONE-ACETAMINOPHEN 5-325 MG PO TABS: 1.0000 | ORAL_TABLET | Freq: Four times a day (QID) | ORAL | 0 refills | Status: DC | PRN

## 2017-03-27 MED ORDER — DIAZEPAM 5 MG PO TABS: 5.0000 mg | ORAL_TABLET | Freq: Four times a day (QID) | ORAL | 0 refills | Status: DC | PRN

## 2017-03-27 NOTE — Progress Notes (Signed)
Chief Complaint  Patient presents with  . Ankle Pain    right     Initial history: 31 year old female presents with several injuries to her right ankle. Over the last year she's had multiple injuries to the ankle.   She's been to the ER several times most recent injury was June. However in the last 3 weeks she has noted intense pain in the lateral side of the leg and ankle behind the fibula and into the foot with painful dorsiflexion as well as plantar flexion. She describes as severe pain constant with constant aching and difficulty ambulating.   She has not had any rest and she tries to work and this requires a lot of standing.   She has worn an ankle brace. She has had x-rays which show soft tissue swelling over the lateral malleolus osteophytes in the medial subtalar joint with some arthrosis.  We treated her with ASO bracing Valium and prednisone crutches  She thinks she may have gotten a rash from the prednisone  She has very minimal if any improvement.   review of systems rash abdomen  BP 129/80   Pulse 96   Ht 5\' 4"  (1.626 m)   Wt 164 lb (74.4 kg)   LMP 02/26/2017   BMI 28.15 kg/m  Inspection of the foot and ankle right lower extremity she is still having mild perineal muscle spasms I was able to dorsiflex her foot but then she went right back into spasm her ankle felt stable her motor exam was normal her skin was intact with some redness from the brace her neurovascular exam remained intact  Recommend Epson salt soaks active range of motion exercises.  We can stop the prednisone but she should continue the Valium and hydrocodone which was refilled  Return in 3 weeks  Encounter Diagnoses  Name Primary?  . Sprain of anterior talofibular ligament of right ankle, initial encounter   . Muscle spasm of right lower extremity

## 2017-03-27 NOTE — Telephone Encounter (Signed)
Spoke to pharmacy about this, she is on valium and hydrocodone, Dr Aline Brochure is aware and wants them to fill Rxs

## 2017-03-27 NOTE — Telephone Encounter (Signed)
Patient relays [place], ph# [phone], will not fill her prescriptions issued by Dr Aline Brochure today.  Please call to verify information needed to fill.

## 2017-03-27 NOTE — Patient Instructions (Signed)
Epsom salt 20-30 min 4 x a day   Then ankle foot exercises up and down 10 x each way

## 2017-04-01 ENCOUNTER — Encounter (HOSPITAL_COMMUNITY): Payer: Self-pay | Admitting: Emergency Medicine

## 2017-04-01 ENCOUNTER — Emergency Department (HOSPITAL_COMMUNITY)
Admission: EM | Admit: 2017-04-01 | Discharge: 2017-04-02 | Disposition: A | Discharge status: Home / Self Care | Payer: Medicaid Other | Attending: Emergency Medicine | Admitting: Emergency Medicine

## 2017-04-01 DIAGNOSIS — Z87891 Personal history of nicotine dependence: Secondary | ICD-10-CM | POA: Diagnosis not present

## 2017-04-01 DIAGNOSIS — L299 Pruritus, unspecified: Secondary | ICD-10-CM | POA: Diagnosis present

## 2017-04-01 DIAGNOSIS — J45909 Unspecified asthma, uncomplicated: Secondary | ICD-10-CM | POA: Diagnosis not present

## 2017-04-01 DIAGNOSIS — Z79899 Other long term (current) drug therapy: Secondary | ICD-10-CM | POA: Insufficient documentation

## 2017-04-01 DIAGNOSIS — T7840XA Allergy, unspecified, initial encounter: Secondary | ICD-10-CM | POA: Insufficient documentation

## 2017-04-01 MED ORDER — FAMOTIDINE IN NACL 20-0.9 MG/50ML-% IV SOLN
20.0000 mg | Freq: Once | INTRAVENOUS | Status: AC
  Administered 2017-04-01: 20 mg via INTRAVENOUS
  Filled 2017-04-01: qty 50

## 2017-04-01 MED ORDER — METHYLPREDNISOLONE SODIUM SUCC 125 MG IJ SOLR
125.0000 mg | Freq: Once | INTRAMUSCULAR | Status: AC
  Administered 2017-04-01: 125 mg via INTRAVENOUS
  Filled 2017-04-01: qty 2

## 2017-04-01 MED ORDER — SODIUM CHLORIDE 0.9 % IV BOLUS (SEPSIS)
500.0000 mL | Freq: Once | INTRAVENOUS | Status: AC
  Administered 2017-04-01: 500 mL via INTRAVENOUS

## 2017-04-01 MED ORDER — DIPHENHYDRAMINE HCL 50 MG/ML IJ SOLN
25.0000 mg | Freq: Once | INTRAMUSCULAR | Status: AC
  Administered 2017-04-01: 25 mg via INTRAVENOUS
  Filled 2017-04-01: qty 1

## 2017-04-01 NOTE — Discharge Instructions (Signed)
Take Benadryl for itching and follow-up with your doctor as needed.

## 2017-04-01 NOTE — ED Notes (Signed)
Pt states that the itching is better, pt updated on plan of care,

## 2017-04-01 NOTE — ED Notes (Signed)
Pt states that she was at [employer] eating and had taken the first dose of hydrocodone when she started itching about 7 minutes after taking the medication, pt denies any sob, denies any problems swallowing,

## 2017-04-01 NOTE — ED Provider Notes (Signed)
[facility] EMERGENCY DEPARTMENT Provider Note   CSN: [id number] Arrival date & time: 04/01/17  1943     History   Chief Complaint No chief complaint on file.   HPI Valerie Huber is a 31 y.o. female.  Patient complains of itching all over after she took 2 Vicodin's and had food at [employer].   The history is provided by the patient. No language interpreter was used.  Allergic Reaction  Presenting symptoms: itching   Presenting symptoms: no rash   Severity:  Moderate Prior allergic episodes:  No prior episodes Context: food   Context: not animal exposure   Relieved by:  Nothing Worsened by:  Nothing Ineffective treatments:  None tried   Past Medical History:  Diagnosis Date  . Asthma   . Endometriosis   . Irregular periods 12/31/2014  . Pelvic pain in female 12/31/2014  . Pregnant 04/14/2015  . Second degree uterine prolapse 12/31/2014  . Seizures ([place])   . Vaginal Pap smear, abnormal     Patient Active Problem List   Diagnosis Date Noted  . Active labor at term 12/02/2015  . Active labor 12/01/2015  . Decreased appetite 11/16/2015  . Insufficient prenatal care 10/25/2015  . Marijuana use 07/05/2015  . Moderate dysplasia of cervix (CIN II)  at endocervical margin of LEEP specimen 01/22/2015  . Pelvic pain in female 12/31/2014  . Endometriosis determined by laparoscopy 12/31/2014  . Second degree uterine prolapse 12/31/2014  . Irregular periods 12/31/2014  . 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 Hematemesis with nausea     Past Surgical History:  Procedure Laterality Date  . ESOPHAGOGASTRODUODENOSCOPY (EGD) N/A 12/10/2014   Performed by Danie Binder, MD at [facility]  . LEEP    . surgery for endometriosis  2005    OB History    Gravida Para Term Preterm AB Living   3 2 2   1 2    SAB TAB Ectopic Multiple Live Births     1   0 2       Home Medications    Prior to Admission medications   Medication Sig Start Date End Date  Taking? Authorizing Provider  diazepam (VALIUM) 5 MG tablet Take 1 tablet (5 mg total) every 6 (six) hours as needed by mouth for anxiety. 03/27/17  Yes Carole Civil, MD  EPINEPHrine (EPI-PEN) 0.3 mg/0.3 mL SOAJ injection Inject 0.3 mg into the muscle once. Reported on 11/23/2015   Yes [provider]  HYDROcodone-acetaminophen (NORCO) 5-325 MG tablet Take 1 tablet every 6 (six) hours as needed by mouth for moderate pain. 03/27/17  Yes Carole Civil, MD  predniSONE (DELTASONE) 5 MG tablet Take 5 mg 3 (three) times daily by mouth.   Yes [provider]  predniSONE (DELTASONE) 10 MG tablet Take 1 tablet (10 mg total) by mouth 3 (three) times daily. Patient not taking: Reported on 04/01/2017 03/11/17   Carole Civil, MD    Family History Family History  Problem Relation Age of Onset  . Cancer Mother        kidney   . Asthma Mother   . Hypertension Mother   . Cancer Paternal Grandmother   . Colon cancer Neg Hx     Social History Social History   Tobacco Use  . Smoking status: Former Smoker    Packs/day: 0.25    Years: 14.00    Pack years: 3.50    Types: Cigarettes  . Smokeless tobacco: Never Used  .  Tobacco comment: QUIT SMOKING ON JULY 11th. STATES WOULD ONLY HAVE A FEW CIGARETTES A WEEK PRIOR TO THIS.   Substance Use Topics  . Alcohol use: No    Alcohol/week: 0.0 oz    Comment: occ.  . Drug use: No     Allergies   Flexeril [cyclobenzaprine]; Ibuprofen; and Vicodin [hydrocodone-acetaminophen]   Review of Systems Review of Systems  Constitutional: Negative for appetite change and fatigue.  HENT: Negative for congestion, ear discharge and sinus pressure.   Eyes: Negative for discharge.  Respiratory: Negative for cough.   Cardiovascular: Negative for chest pain.  Gastrointestinal: Negative for abdominal pain and diarrhea.  Genitourinary: Negative for frequency and hematuria.  Musculoskeletal: Negative for back pain.  Skin: Positive  for itching. Negative for rash.       itching  Neurological: Negative for seizures and headaches.  Psychiatric/Behavioral: Negative for hallucinations.     Physical Exam Updated Vital Signs BP 95/61   Pulse 81   Temp 99.5 F (37.5 C) (Oral)   Resp 20   Ht 5\' 4"  (1.626 m)   Wt 74.4 kg (164 lb)   LMP 03/26/2017 (Approximate)   SpO2 100%   BMI 28.15 kg/m   Physical Exam  Constitutional: She is oriented to person, place, and time. She appears well-developed.  HENT:  Head: Normocephalic.  Eyes: Conjunctivae and EOM are normal. No scleral icterus.  Neck: Neck supple. No thyromegaly present.  Cardiovascular: Normal rate and regular rhythm. Exam reveals no gallop and no friction rub.  No murmur heard. Pulmonary/Chest: No stridor. She has no wheezes. She has no rales. She exhibits no tenderness.  Abdominal: She exhibits no distension. There is no tenderness. There is no rebound.  Musculoskeletal: Normal range of motion. She exhibits no edema.  Lymphadenopathy:    She has no cervical adenopathy.  Neurological: She is oriented to person, place, and time. She exhibits normal muscle tone. Coordination normal.  Skin: No rash noted. No erythema.  Psychiatric: She has a normal mood and affect. Her behavior is normal.     ED Treatments / Results  Labs (all labs ordered are listed, but only abnormal results are displayed) Labs Reviewed - No data to display  EKG  EKG Interpretation None       Radiology No results found.  Procedures Procedures (including critical care time)  Medications Ordered in ED Medications  diphenhydrAMINE (BENADRYL) injection 25 mg (25 mg Intravenous Given 04/01/17 2038)  methylPREDNISolone sodium succinate (SOLU-MEDROL) 125 mg/2 mL injection 125 mg (125 mg Intravenous Given 04/01/17 2039)  famotidine (PEPCID) IVPB 20 mg premix (0 mg Intravenous Stopped 04/01/17 2202)  sodium chloride 0.9 % bolus 500 mL (0 mLs Intravenous Stopped 04/01/17 2202)      Initial Impression / Assessment and Plan / ED Course  I have reviewed the triage vital signs and the nursing notes.  Pertinent labs & imaging results that were available during my care of the patient were reviewed by me and considered in my medical decision making (see chart for details).     Patient with allergic reaction.  Patient improved with Benadryl and steroids and will follow up with her doctor as needed  Final Clinical Impressions(s) / ED Diagnoses   Final diagnoses:  Allergic reaction, initial encounter    ED Discharge Orders    None       Milton Ferguson, MD 04/01/17 2352

## 2017-04-17 ENCOUNTER — Ambulatory Visit (INDEPENDENT_AMBULATORY_CARE_PROVIDER_SITE_OTHER): Payer: Medicaid Other | Admitting: Orthopedic Surgery

## 2017-04-17 ENCOUNTER — Encounter: Payer: Self-pay | Admitting: Orthopedic Surgery

## 2017-04-17 VITALS — BP 139/69 | HR 92 | Ht 64.0 in | Wt 164.0 lb

## 2017-04-17 DIAGNOSIS — M25471 Effusion, right ankle: Secondary | ICD-10-CM | POA: Diagnosis not present

## 2017-04-17 DIAGNOSIS — M62838 Other muscle spasm: Secondary | ICD-10-CM | POA: Diagnosis not present

## 2017-04-17 DIAGNOSIS — M1A0710 Idiopathic chronic gout, right ankle and foot, without tophus (tophi): Secondary | ICD-10-CM

## 2017-04-17 DIAGNOSIS — M25571 Pain in right ankle and joints of right foot: Secondary | ICD-10-CM | POA: Diagnosis not present

## 2017-04-17 DIAGNOSIS — S93491A Sprain of other ligament of right ankle, initial encounter: Secondary | ICD-10-CM

## 2017-04-17 MED ORDER — HYDROCODONE-ACETAMINOPHEN 5-325 MG PO TABS: 1.0000 | ORAL_TABLET | Freq: Four times a day (QID) | ORAL | 0 refills | Status: DC | PRN

## 2017-04-17 MED ORDER — DIAZEPAM 5 MG PO TABS: 5.0000 mg | ORAL_TABLET | Freq: Four times a day (QID) | ORAL | 0 refills | Status: DC | PRN

## 2017-04-17 NOTE — Progress Notes (Signed)
Progress Note   Patient ID: Valerie Huber, female   DOB: 15-Mar-1986, 31 y.o.   MRN: [id number]  Chief Complaint  Patient presents with  . Ankle Injury    right states getting worse     31 year old female no improvement.  She continues to have inability to bear weight on her right ankle, she is still on crutches.  She continues to have severe pain and perineal muscle spasm.  She continues to have dull aching throbbing discomfort with swelling and erythema around the lateral portion of the ankle near the malleolus  Initial history: 31 year old female presents with several injuries to her right ankle. Over the last year she's had multiple injuries to the ankle.   She's been to the ER several times most recent injury was June. However in the last 3 weeks she has noted intense pain in the lateral side of the leg and ankle behind the fibula and into the foot with painful dorsiflexion as well as plantar flexion. She describes as severe pain constant with constant aching and difficulty ambulating.   She has not had any rest and she tries to work and this requires a lot of standing.   She has worn an ankle brace. She has had x-rays which show soft tissue swelling over the lateral malleolus osteophytes in the medial subtalar joint with some arthrosis.   We treated her with ASO bracing Valium and prednisone crutches       Review of Systems  Constitutional: Negative for chills and fever.  Musculoskeletal: Negative for back pain.  Neurological: Negative for tingling and sensory change.   Current Meds  Medication Sig  . diazepam (VALIUM) 5 MG tablet Take 1 tablet (5 mg total) every 6 (six) hours as needed by mouth for anxiety.  [name] EPINEPHrine (EPI-PEN) 0.3 mg/0.3 mL SOAJ injection Inject 0.3 mg into the muscle once. Reported on 11/23/2015  . HYDROcodone-acetaminophen (NORCO) 5-325 MG tablet Take 1 tablet every 6 (six) hours as needed by mouth for moderate pain.  . predniSONE (DELTASONE) 5 MG  tablet Take 5 mg 3 (three) times daily by mouth.    Past Medical History:  Diagnosis Date  . Asthma   . Endometriosis   . Irregular periods 12/31/2014  . Pelvic pain in female 12/31/2014  . Pregnant 04/14/2015  . Second degree uterine prolapse 12/31/2014  . Seizures ([place])   . Vaginal Pap smear, abnormal      Allergies  Allergen Reactions  . Flexeril [Cyclobenzaprine] Hives  . Ibuprofen Other (See Comments)    "If I take 2-3 during the day it causes my stomach to become upset" hives also  . Vicodin [Hydrocodone-Acetaminophen]     Itching     BP 139/69   Pulse 92   Ht _0  (1.626 m)   Wt 164 lb (74.4 kg)   LMP 03/26/2017 (Approximate)   BMI 28.15 kg/m    Physical Exam  Constitutional: She is oriented to person, place, and time. She appears well-developed and well-nourished.  Musculoskeletal:       Right ankle: She exhibits decreased range of motion and swelling. She exhibits no ecchymosis, no deformity, no laceration and normal pulse. Tenderness. AITFL and CF ligament tenderness found. No lateral malleolus, no medial malleolus, no posterior TFL, no head of 5th metatarsal and no proximal fibula tenderness found. Achilles tendon exhibits no pain, no defect and normal Thompson's test results.       Feet:  Neurological: She is alert and oriented to person, place, and  time. She has normal strength. She displays no atrophy and no tremor. No sensory deficit. She exhibits normal muscle tone. She displays no seizure activity. Gait abnormal. Coordination normal.  Crutches required nonweightbearing  Skin: There is erythema.     Psychiatric: She has a normal mood and affect. Judgment normal.  Vitals reviewed.   Ortho Exam  See above   Medical decision-making  Imaging: order mri   Order cbc w diff Uric acid  Esr crp    Encounter Diagnoses  Name Primary?  . Sprain of anterior talofibular ligament of right ankle, initial encounter Yes  . Muscle spasm of right lower  extremity   . Idiopathic chronic gout of right ankle without tophus       Meds ordered this encounter  Medications  . HYDROcodone-acetaminophen (NORCO) 5-325 MG tablet    Sig: Take 1 tablet by mouth every 6 (six) hours as needed for moderate pain.    Dispense:  30 tablet    Refill:  0  . diazepam (VALIUM) 5 MG tablet    Sig: Take 1 tablet (5 mg total) by mouth every 6 (six) hours as needed for anxiety.    Dispense:  60 tablet    Refill:  0     Arther Abbott, MD 04/17/2017 10:20 AM

## 2017-04-23 ENCOUNTER — Telehealth: Payer: Self-pay | Admitting: Radiology

## 2017-04-23 NOTE — Telephone Encounter (Signed)
Called patient regarding the MRI sch for Monday Dec 17th  At 4pm. She voiced understanding.

## 2017-04-29 ENCOUNTER — Ambulatory Visit (HOSPITAL_COMMUNITY)
Admission: RE | Admit: 2017-04-29 | Discharge: 2017-04-29 | Disposition: A | Discharge status: Home / Self Care | Payer: Medicaid Other | Source: Ambulatory Visit | Attending: Orthopedic Surgery | Admitting: Orthopedic Surgery

## 2017-04-29 DIAGNOSIS — M1A0710 Idiopathic chronic gout, right ankle and foot, without tophus (tophi): Secondary | ICD-10-CM | POA: Diagnosis not present

## 2017-04-29 DIAGNOSIS — S93491A Sprain of other ligament of right ankle, initial encounter: Secondary | ICD-10-CM | POA: Diagnosis present

## 2017-04-29 DIAGNOSIS — M25571 Pain in right ankle and joints of right foot: Secondary | ICD-10-CM | POA: Insufficient documentation

## 2017-04-29 DIAGNOSIS — M25471 Effusion, right ankle: Secondary | ICD-10-CM | POA: Insufficient documentation

## 2017-04-29 DIAGNOSIS — X58XXXA Exposure to other specified factors, initial encounter: Secondary | ICD-10-CM | POA: Insufficient documentation

## 2017-04-29 DIAGNOSIS — M216X1 Other acquired deformities of right foot: Secondary | ICD-10-CM | POA: Diagnosis not present

## 2017-05-03 ENCOUNTER — Ambulatory Visit (INDEPENDENT_AMBULATORY_CARE_PROVIDER_SITE_OTHER): Payer: Medicaid Other | Admitting: Orthopedic Surgery

## 2017-05-03 ENCOUNTER — Encounter: Payer: Self-pay | Admitting: Orthopedic Surgery

## 2017-05-03 VITALS — BP 130/80 | HR 89 | Ht 64.0 in | Wt 164.0 lb

## 2017-05-03 DIAGNOSIS — Q6689 Other  specified congenital deformities of feet: Secondary | ICD-10-CM

## 2017-05-03 DIAGNOSIS — S93491A Sprain of other ligament of right ankle, initial encounter: Secondary | ICD-10-CM

## 2017-05-03 DIAGNOSIS — M62838 Other muscle spasm: Secondary | ICD-10-CM | POA: Diagnosis not present

## 2017-05-03 MED ORDER — HYDROCODONE-ACETAMINOPHEN 5-325 MG PO TABS: 1.0000 | ORAL_TABLET | Freq: Four times a day (QID) | ORAL | 0 refills | Status: DC | PRN

## 2017-05-03 NOTE — Patient Instructions (Signed)
Tarsal Coalition Tarsal coalition is a type of foot disorder. Seven bones (tarsals) make up the back of your foot, which includes the heel. Normally, these bones move freely within the foot and alongside one another. If you have a tarsal coalition, two or more of these bones have abnormally fused together in a bridge (bar) of bone, cartilage, or fibrous tissue. This can cause pain, difficulty walking, and other foot problems. Tarsal coalition can affect one foot or both feet. The condition can be mild or severe depending on:  How many tarsal bones are affected. Often, two bones are affected, but it can be more.  The size of the bars. Larger bars lead to more severe symptoms.  What are the causes? This condition is most commonly caused by a gene defect (gene mutation) that is passed down through families. In rare cases, this condition can be caused by:  Arthritis in the feet.  A previous foot injury.  An infection.  What increases the risk? This condition is more likely to develop in:  Children and adolescents.  People who have arthritis.  People who have had a previous foot injury or infection.  What are the signs or symptoms? Symptoms of tarsal coalition often begin in childhood or adolescence as bones begin to harden. Symptoms include:  A flat foot.  Difficulty walking.  Walking with a limp.  Foot stiffness.  Tiring easily when walking or standing.  Pain when standing or walking.  Muscle spasms.  How is this diagnosed? This condition is diagnosed based on your symptoms and medical history. Your health care provider will do a physical exam to check your foot. Your health care provider may also do imaging tests to confirm the diagnosis, such as:  X-ray.  MRI.  CT scan.  How is this treated? Treatment for tarsal coalition depends on the severity of your condition. Treatment may include:  Rest.  Over-the-counter or prescription medicines for pain.  Steroid  injections.  Physical therapy.  Orthotics, such as heel cups, wedges, and arch supports.  A cast or a walking boot.  You may need surgery to repair the tarsal coalition if nonsurgical treatments do not help. Follow these instructions at home: General instructions  Take over-the-counter and prescription medicines only as told by your health care provider.  If directed, apply ice to the injured area. ? Put ice in a plastic bag. ? Place a towel between your skin and the bag. ? Leave the ice on for 20 minutes, 2-3 times per day.  Return to your normal activities as told by your health care provider. Ask your health care provider what activities are safe for you.  Do exercises daily as told by your health care provider or physical therapist.  Keep all follow-up visits as told by your health care provider. This is important. Driving  Do not drive or operate heavy machinery while taking prescription pain medicine.  Ask your health care provider when it is safe to drive if you have a cast or walking boot on your foot. If you have a cast:  Do not stick anything inside the cast to scratch your skin. Doing that increases your risk of infection.  Check the skin around the cast every day. Report any concerns to your health care provider. You may put lotion on dry skin around the edges of the cast. Do not apply lotion to the skin underneath the cast.  Do not let your cast get wet if it is not waterproof. If it  is not waterproof, cover it with a watertight plastic bag when you take a bath or a shower. If You Have a Walking Boot:  Wear the walking boot as told by your health care provider. Remove it only as told by your health care provider.  Do not let your walking boot get wet if it is not waterproof.  Keep the walking boot clean. Contact a health care provider if:  Your pain gets worse.  You have new pain in the foot.  Your muscle spasms get worse.  You have a harder time with  walking. Get help right away if:  Your foot is numb or cold.  Your toenails turn blue, gray, or another dark color. This information is not intended to replace advice given to you by your health care provider. Make sure you discuss any questions you have with your health care provider. Document Released: 04/30/2005 Document Revised: 03/29/2016 Document Reviewed: 11/17/2014 Elsevier Interactive Patient Education  2017 [employer].

## 2017-05-03 NOTE — Addendum Note (Signed)
Addended byCandice Camp on: 05/03/2017 10:08 AM   Modules accepted: Orders

## 2017-05-03 NOTE — Progress Notes (Signed)
Progress Note   Patient ID: Valerie Huber, female   DOB: 04-04-1986, 31 y.o.   MRN: [id number]  Chief Complaint  Patient presents with  . Follow-up    MRI results of right ankle.    31 year old female followed for several weeks now with painful subtalar joint and pes planus refractory to immobilization prednisone Valium and Norco with continued pain went for MRI she has a tarsal coalition of the talocalcaneal area.     Review of Systems  Skin:       Erythema around the lateral ankle with no laceration  Neurological: Positive for tingling.   No outpatient medications have been marked as taking for the 05/03/17 encounter (Office Visit) with Carole Civil, MD.    Allergies  Allergen Reactions  . Flexeril [Cyclobenzaprine] Hives  . Ibuprofen Other (See Comments)    "If I take 2-3 during the day it causes my stomach to become upset" hives also  . Vicodin [Hydrocodone-Acetaminophen]     Itching      BP 130/80   Pulse 89   Ht 5\' 4"  (1.626 m)   Wt 164 lb (74.4 kg)   BMI 28.15 kg/m   Physical Exam  Constitutional: She is oriented to person, place, and time. She appears well-developed and well-nourished.  Musculoskeletal:       Feet:  Neurological: She is alert and oriented to person, place, and time.  Psychiatric: She has a normal mood and affect. Judgment normal.  Vitals reviewed.    Medical decision-making Encounter Diagnosis  Name Primary?  . Coalition, talocalcaneal Yes   Report summation is included below  To my personal interpretation of the MRI is that the bony anatomy is otherwise normal degenerative changes primarily there appears to be a fibrous coalition between the talus and calcaneus  MRI report IMPRESSION: 1. 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 2. Intact talofibular ligaments.     Electronically Signed  Referral to foot and ankle specialist  By: Lorriane Shire M.D.   On: 04/29/2017 16:35   Meds ordered this encounter  Medications  . HYDROcodone-acetaminophen (NORCO) 5-325 MG tablet    Sig: Take 1 tablet by mouth every 6 (six) hours as needed for moderate pain.    Dispense:  30 tablet    Refill:  0   Recommend referral to foot and ankle specialist for surgical treatment continue current medications of prednisone Valium and hydrocodone  Arther Abbott, MD 05/03/2017 9:51 AM

## 2017-05-17 ENCOUNTER — Telehealth (INDEPENDENT_AMBULATORY_CARE_PROVIDER_SITE_OTHER): Payer: Self-pay | Admitting: Radiology

## 2017-05-17 ENCOUNTER — Encounter (INDEPENDENT_AMBULATORY_CARE_PROVIDER_SITE_OTHER): Payer: Self-pay | Admitting: Orthopedic Surgery

## 2017-05-17 ENCOUNTER — Telehealth: Payer: Self-pay

## 2017-05-17 ENCOUNTER — Other Ambulatory Visit (INDEPENDENT_AMBULATORY_CARE_PROVIDER_SITE_OTHER): Payer: Self-pay

## 2017-05-17 ENCOUNTER — Emergency Department (HOSPITAL_COMMUNITY)
Admission: EM | Admit: 2017-05-17 | Discharge: 2017-05-17 | Discharge status: Absent without leave | Payer: Medicaid Other

## 2017-05-17 ENCOUNTER — Ambulatory Visit (INDEPENDENT_AMBULATORY_CARE_PROVIDER_SITE_OTHER): Payer: Medicaid Other | Admitting: Orthopedic Surgery

## 2017-05-17 DIAGNOSIS — Q6689 Other  specified congenital deformities of feet: Secondary | ICD-10-CM

## 2017-05-17 DIAGNOSIS — S93491S Sprain of other ligament of right ankle, sequela: Secondary | ICD-10-CM

## 2017-05-17 DIAGNOSIS — M25572 Pain in left ankle and joints of left foot: Secondary | ICD-10-CM

## 2017-05-17 HISTORY — DX: Other specified congenital deformities of feet: Q66.89

## 2017-05-17 MED ORDER — METHYLPREDNISOLONE ACETATE 80 MG/ML IJ SUSP
80.0000 mg | INTRAMUSCULAR | Status: AC | PRN
  Administered 2017-05-17: 80 mg via INTRA_ARTICULAR

## 2017-05-17 MED ORDER — LIDOCAINE HCL 1 % IJ SOLN
1.0000 mL | INTRAMUSCULAR | Status: AC | PRN
  Administered 2017-05-17: 1 mL

## 2017-05-17 MED ORDER — LIDOCAINE HCL 1 % IJ SOLN
2.0000 mL | INTRAMUSCULAR | Status: AC | PRN
  Administered 2017-05-17: 2 mL

## 2017-05-17 MED ORDER — METHYLPREDNISOLONE ACETATE 40 MG/ML IJ SUSP
40.0000 mg | INTRAMUSCULAR | Status: AC | PRN
  Administered 2017-05-17: 40 mg via INTRA_ARTICULAR

## 2017-05-17 NOTE — Telephone Encounter (Signed)
Pt had gone to [facility] and is now at [facility]

## 2017-05-17 NOTE — Progress Notes (Signed)
Office Visit Note   Patient: Valerie Huber           Date of Birth: [birth year]           MRN: [id number] Visit Date: 05/17/2017              Requested by: Carole Civil, [address], [place] [address] PCP: Raiford Simmonds., PA-C  No chief complaint on file.     HPI: Patient is a 32 year old woman who sustained an ankle sprain about 6 months ago when she jumped off a swing she states she has had pain and swelling since that time she is wearing an ankle stabilizing orthosis she has on crutches.  She complains of persistent pain and swelling.  Assessment & Plan: Visit Diagnoses:  1. Calcaneonavicular bar     Plan: We will draw a uric acid level today, the ankle and subtalar joint were injected.  We will reevaluate in 3 weeks.  Patient may require subtalar arthrodesis and possible arthroscopic ankle debridement.  Follow-Up Instructions: Return in about 3 weeks (around 06/07/2017).   Ortho Exam  Patient is alert, oriented, no adenopathy, well-dressed, normal affect, normal respiratory effort. Examination patient has a good dorsalis pedis pulse she has good subtalar motion of the left foot she has essentially no subtalar motion on the right.  She has good range of motion of both ankles.  She has swelling with skin color changes around the right ankle and right hindfoot.  She is tender to palpation globally across the ankle as well as across the subtalar joint.  Patient has pain to palpation of the sinus Tarsi she has pain reproduced with attempted inversion and eversion.  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  Her anterior drawer is stable with no laxity of the anterior talofibular ligament.  Imaging: No results found. No images are attached to the encounter.  Labs: Lab Results  Component Value Date   ESRSEDRATE 6 12/31/2014   REPTSTATUS 11/25/2014 FINAL 11/23/2014   CULT  11/23/2014    80,000 COLONIES/ml GROUP  B STREP(S.AGALACTIAE)ISOLATED TESTING AGAINST S. AGALACTIAE NOT ROUTINELY PERFORMED DUE TO PREDICTABILITY OF AMP/PEN/VAN SUSCEPTIBILITY. Performed at [facility]     @LABSALLVALUES (HGBA1)@  There is no height or weight on file to calculate BMI.  Orders:  No orders of the defined types were placed in this encounter.  No orders of the defined types were placed in this encounter.    Procedures: Small Joint Inj: R subtalar on 05/17/2017 8:45 AM Indications: pain and diagnostic evaluation Details: 22 G needle, dorsal approach  Spinal Needle: No  Medications: 1 mL lidocaine 1 %; 80 mg methylPREDNISolone acetate 80 MG/ML Outcome: tolerated well, no immediate complications Procedure, treatment alternatives, risks and benefits explained, specific risks discussed. Consent was given by the patient. Immediately prior to procedure a time out was called to verify the correct patient, procedure, equipment, support staff and site/side marked as required. Patient was prepped and draped in the usual sterile fashion.   Medium Joint Inj: R ankle on 05/17/2017 8:46 AM Indications: pain and diagnostic evaluation Details: 22 G 1.5 in needle, anteromedial approach Medications: 2 mL lidocaine 1 %; 40 mg methylPREDNISolone acetate 40 MG/ML Outcome: tolerated well, no immediate complications Procedure, treatment alternatives, risks and benefits explained, specific risks discussed. Consent was given by the patient. Immediately prior to procedure a time out was called to verify the correct patient, procedure, equipment, support staff  and site/side marked as required. Patient was prepped and draped in the usual sterile fashion.      Clinical Data: No additional findings.  ROS:  All other systems negative, except as noted in the HPI. Review of Systems  Objective: Vital Signs: There were no vitals taken for this visit.  Specialty Comments:  No specialty comments available.  PMFS  History: Patient Active Problem List   Diagnosis Date Noted  . 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 Active labor at term 12/02/2015  . Active labor 12/01/2015  . Decreased appetite 11/16/2015  . Insufficient prenatal care 10/25/2015  . Marijuana use 07/05/2015  . Moderate dysplasia of cervix (CIN II)  at endocervical margin of LEEP specimen 01/22/2015  . Pelvic pain in female 12/31/2014  . Endometriosis determined by laparoscopy 12/31/2014  . Second degree uterine prolapse 12/31/2014  . Irregular periods 12/31/2014  . 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 Hematemesis with nausea    Past Medical History:  Diagnosis Date  . Asthma   . Endometriosis   . Irregular periods 12/31/2014  . Pelvic pain in female 12/31/2014  . Pregnant 04/14/2015  . Second degree uterine prolapse 12/31/2014  . Seizures ([place])   . Vaginal Pap smear, abnormal     Family History  Problem Relation Age of Onset  . Cancer Mother        kidney   . Asthma Mother   . Hypertension Mother   . Cancer Paternal Grandmother   . Colon cancer Neg Hx     Past Surgical History:  Procedure Laterality Date  . ESOPHAGOGASTRODUODENOSCOPY N/A 12/10/2014   SLF: hematoemesis due to moderate erosive gastritis, duoedentitis, and duodenal ulcer  . LEEP    . surgery for endometriosis  2005   Social History   Occupational History  . Occupation: Housecleaning  Tobacco Use  . Smoking status: Former Smoker    Packs/day: 0.25    Years: 14.00    Pack years: 3.50    Types: Cigarettes  . Smokeless tobacco: Never Used  . Tobacco comment: QUIT SMOKING ON JULY 11th. STATES WOULD ONLY HAVE A FEW CIGARETTES A WEEK PRIOR TO THIS.   Substance and Sexual Activity  . Alcohol use: No    Alcohol/week: 0.0 oz    Comment: occ.  . Drug use: No  . Sexual activity: Yes    Birth control/protection: Injection

## 2017-05-17 NOTE — Telephone Encounter (Signed)
Patient called stating that the pain is 10 times worse since she received injection in her right ankle this morning.  Right ankle is swollen and red.  Stated that she is not able to put any weight on her right ankle.  Cb# [phone].  Please advise.

## 2017-05-17 NOTE — Addendum Note (Signed)
Addended by: Pamella Pert on: 05/17/2017 04:42 PM   Modules accepted: Orders

## 2017-05-17 NOTE — Telephone Encounter (Signed)
Patient calling today triage complaining of increased pain. She is very tearful and anxious on the phone. She had called previously approximately 30 minutes ago. Complaining that she can now not even put her foot on the floor due to pain. She is not on any blood thinners. She denies any type of calf pain. She is having no blistering or sores, or hives. She does not have any relevant history to allergy of cortisone, has taking prednisone without any type of reaction. She has no known history of allergy to lidocaine. Asked what she was taking for pain, she was taking hydrocodone. She is very upset, advised her to go to ER for pain control. I had advised it is not uncommon to feel worse before she feels better she had her injection just a couple hours ago, and often times people may not feel relief until the following day. Suggested ice, elevation, rest. She advised she is doing all this already. Advised to go to ER, because there is nothing in office we can give to relieve the level of pain she is in right now and this could be indicative of something else going on.

## 2017-05-18 LAB — URIC ACID: URIC ACID, SERUM: 4.9 mg/dL (ref 2.5–7.0)

## 2017-05-20 ENCOUNTER — Telehealth: Payer: Self-pay | Admitting: Orthopedic Surgery

## 2017-05-20 ENCOUNTER — Ambulatory Visit (INDEPENDENT_AMBULATORY_CARE_PROVIDER_SITE_OTHER): Payer: Medicaid Other | Admitting: Orthopedic Surgery

## 2017-05-20 ENCOUNTER — Encounter (INDEPENDENT_AMBULATORY_CARE_PROVIDER_SITE_OTHER): Payer: Self-pay | Admitting: Orthopedic Surgery

## 2017-05-20 DIAGNOSIS — G90521 Complex regional pain syndrome I of right lower limb: Secondary | ICD-10-CM

## 2017-05-20 DIAGNOSIS — Q6689 Other  specified congenital deformities of feet: Secondary | ICD-10-CM

## 2017-05-20 MED ORDER — GABAPENTIN 300 MG PO CAPS: 300.0000 mg | ORAL_CAPSULE | Freq: Three times a day (TID) | ORAL | 3 refills | Status: DC

## 2017-05-20 MED ORDER — AMITRIPTYLINE HCL 25 MG PO TABS: 25.0000 mg | ORAL_TABLET | Freq: Every day | ORAL | 3 refills | Status: DC

## 2017-05-20 MED ORDER — DULOXETINE HCL 30 MG PO CPEP: 30.0000 mg | ORAL_CAPSULE | Freq: Every day | ORAL | 0 refills | Status: DC

## 2017-05-20 NOTE — Telephone Encounter (Signed)
Will call after clinic

## 2017-05-20 NOTE — Progress Notes (Signed)
Office Visit Note   Patient: Valerie Huber           Date of Birth: April 19, 1986           MRN: [id number] Visit Date: 05/20/2017              Requested by: Raiford Simmonds., PA-C [address], [place] [address] PCP: Raiford Simmonds., PA-C  Chief Complaint  Patient presents with  . Right Ankle - Pain      HPI: Patient is a 32 year old woman who sprained her ankle about 6 months ago.  Patient was seen for initial evaluation on 05/17/2017.  She states she is been having excruciating pain in the ankle and foot for the past 6 months.  Her uric acid level on the initial office visit was 4.9.  Patient had an MRI scan which showed a subtalar bar.  On her last examination she underwent a subtalar injection as well as an ankle injection.  Patient states this made her pain 10 times worse.  She did go to [facility] emergency room and then to [facility] emergency room on Friday, she was given a dose of Toradol and a prescription for Toradol.  Patient states that this did not help.  Patient states that her pain is still  severe day and night.  Assessment & Plan: Visit Diagnoses:  1. Calcaneonavicular bar   2. Complex regional pain syndrome type 1 of right lower extremity     Plan: Discussed with the patient that the injection was to determine if her pain was coming from the ankle joint or the subtalar bar.  Since the injection did not help, her pain is most likely coming from complex regional pain syndrome type I.  Discussed that while she does not have all of the signs and symptoms of complex regional pain syndrome this is the most likely cause of her chronic pain with essentially normal x-ray and MRI scan other than the subtalar bone.  Discussed that since this is been going on for such a long time it is harder to stop the pain process of the sympathetic nervous system.  We will start her on Elavil at night 25 mg Cymbalta 30 mg in the morning and Neurontin 300 mg 3 times a  day.  Discussed that there are side effects of these medications and she should call us immediately if she has any side effects and we will adjust medications or doses accordingly.  Recommended against going to different doctors that with different opinion she would not obtain consistent care.  Discussed that sympathetic injections can be beneficial and patient states that due to the increased pain from her ankle and subtalar injection she is not willing to consider a sympathetic block.  Plan to follow-up in 1 week.  Follow-Up Instructions: Return in about 1 week (around 05/27/2017).   Ortho Exam  Patient is alert, oriented, no adenopathy, well-dressed, normal affect, normal respiratory effort.  Patient is on crutches and having difficulty weightbearing. On examination patient does have some skin discoloration laterally over the right ankle.  She does have swelling of the right lower extremity with some venous stasis changes of the right leg.  The foot is warm to the touch and has the same temperature as her left foot.  Patient does have hypersensitivity to light touch globally around the foot and ankle.  There is no temperature change there is no whiteness or increased redness to the foot she does have  the brawny edema laterally.  The skin is not red or white laterally.  Imaging: No results found. No images are attached to the encounter.  Labs: Lab Results  Component Value Date   ESRSEDRATE 6 12/31/2014   LABURIC 4.9 05/17/2017   REPTSTATUS 11/25/2014 FINAL 11/23/2014   CULT  11/23/2014    80,000 COLONIES/ml GROUP B STREP(S.AGALACTIAE)ISOLATED TESTING AGAINST S. AGALACTIAE NOT ROUTINELY PERFORMED DUE TO PREDICTABILITY OF AMP/PEN/VAN SUSCEPTIBILITY. Performed at [facility]     @LABSALLVALUES (HGBA1)@  There is no height or weight on file to calculate BMI.  Orders:  No orders of the defined types were placed in this encounter.  Meds ordered this encounter  Medications  .  amitriptyline (ELAVIL) 25 MG tablet    Sig: Take 1 tablet (25 mg total) by mouth at bedtime.    Dispense:  30 tablet    Refill:  3  . gabapentin (NEURONTIN) 300 MG capsule    Sig: Take 1 capsule (300 mg total) by mouth 3 (three) times daily. 3 times a day when necessary neuropathy pain    Dispense:  90 capsule    Refill:  3  . DULoxetine (CYMBALTA) 30 MG capsule    Sig: Take 1 capsule (30 mg total) by mouth daily with breakfast. Qam    Dispense:  30 capsule    Refill:  0     Procedures: No procedures performed  Clinical Data: No additional findings.  ROS:  All other systems negative, except as noted in the HPI. Review of Systems  Objective: Vital Signs: There were no vitals taken for this visit.  Specialty Comments:  No specialty comments available.  PMFS History: Patient Active Problem List   Diagnosis Date Noted  . Complex regional pain syndrome type 1 of right lower extremity 05/20/2017  . 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 Active labor at term 12/02/2015  . Active labor 12/01/2015  . Decreased appetite 11/16/2015  . Insufficient prenatal care 10/25/2015  . Marijuana use 07/05/2015  . Moderate dysplasia of cervix (CIN II)  at endocervical margin of LEEP specimen 01/22/2015  . Pelvic pain in female 12/31/2014  . Endometriosis determined by laparoscopy 12/31/2014  . Second degree uterine prolapse 12/31/2014  . Irregular periods 12/31/2014  . 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 Hematemesis with nausea    Past Medical History:  Diagnosis Date  . Asthma   . Endometriosis   . Irregular periods 12/31/2014  . Pelvic pain in female 12/31/2014  . Pregnant 04/14/2015  . Second degree uterine prolapse 12/31/2014  . Seizures ([place])   . Vaginal Pap smear, abnormal     Family History  Problem Relation Age of Onset  . Cancer Mother        kidney   . Asthma Mother   . Hypertension Mother   . Cancer Paternal Grandmother   . Colon cancer Neg Hx     Past  Surgical History:  Procedure Laterality Date  . ESOPHAGOGASTRODUODENOSCOPY N/A 12/10/2014   SLF: hematoemesis due to moderate erosive gastritis, duoedentitis, and duodenal ulcer  . LEEP    . surgery for endometriosis  2005   Social History   Occupational History  . Occupation: Housecleaning  Tobacco Use  . Smoking status: Former Smoker    Packs/day: 0.25    Years: 14.00    Pack years: 3.50    Types: Cigarettes  . Smokeless tobacco: Never Used  . Tobacco comment: QUIT SMOKING ON JULY 11th. STATES WOULD ONLY HAVE A FEW  CIGARETTES A WEEK PRIOR TO THIS.   Substance and Sexual Activity  . Alcohol use: No    Alcohol/week: 0.0 oz    Comment: occ.  . Drug use: No  . Sexual activity: Yes    Birth control/protection: Injection

## 2017-05-20 NOTE — Telephone Encounter (Signed)
Patient called today stating that she wanted to talk to Dr. Aline Brochure regarding the referral that sent her to.  She was apparently very upset but would not let me help her.  She said she wanted to speak to Dr. Aline Brochure.  I told her that he was with patients but that I would send a message.  Would you call and see if the patient will allow you to help her since she wouldn't let me?  Thanks

## 2017-05-20 NOTE — Telephone Encounter (Signed)
Patient has walked in and sat in the lobby. I have advised her per Dr Aline Brochure he has her message. She wants him to review notes from Dr Sharol Given. I have told her Dr Aline Brochure has the notes from Dr Sharol Given and has already reviewed them. I have advised her she does not have an appointment and Dr Aline Brochure has told me he will call her.

## 2017-05-20 NOTE — Telephone Encounter (Signed)
OK 

## 2017-05-20 NOTE — Telephone Encounter (Signed)
I called patient Valerie Huber has seen Dr Sharol Given had injections in ankle, states injections have made her ankle worse. Dr Sharol Given has diagnosed her with Complex Regional Pain syndrome, is going to treat her for this.   Patient requesting you call her back.

## 2017-05-20 NOTE — Telephone Encounter (Signed)
Patient has stopped by office, requests to speak with Dr Valerie Huber personally, as noted. States has some paperwork to ask Dr Valerie Huber about - I offered to look at the paperwork in the event I can assist, patient did not wish to present it to anyone but Dr Valerie Huber. Insists on waiting for Dr.

## 2017-05-21 ENCOUNTER — Other Ambulatory Visit: Payer: Self-pay | Admitting: Orthopedic Surgery

## 2017-05-21 ENCOUNTER — Telehealth: Payer: Self-pay | Admitting: Radiology

## 2017-05-21 DIAGNOSIS — Q6689 Other  specified congenital deformities of feet: Secondary | ICD-10-CM

## 2017-05-21 NOTE — Telephone Encounter (Signed)
I called patient about referral to Dr Lorraine Lax I have sent this for her and they will call her back with appointment I have advised her to get her MRI from [facility] on disk so she can take it with her for the appointment, she states she will do this. She also states she will call me back if she does not hear anything back from the appointment within a week or so.

## 2017-05-21 NOTE — Telephone Encounter (Signed)
Dr Aline Brochure has called her, and discussed. He has had me put in referral to Dr Lorraine Lax at [facility]

## 2017-05-27 ENCOUNTER — Ambulatory Visit (INDEPENDENT_AMBULATORY_CARE_PROVIDER_SITE_OTHER): Payer: Medicaid Other | Admitting: Orthopedic Surgery

## 2017-06-05 ENCOUNTER — Telehealth: Payer: Self-pay | Admitting: Orthopedic Surgery

## 2017-06-05 NOTE — Telephone Encounter (Signed)
Let her know and she should follow up with previous referral

## 2017-06-05 NOTE — Telephone Encounter (Signed)
[facility] has declined referral. Please advise.

## 2017-06-05 NOTE — Telephone Encounter (Signed)
I received a call this morning from a lady at [facility].  She states that the referral from Dr. Aline Brochure has been denied.    She did not give me a reason for the denial but she did state that Dr. Aline Brochure may request a peer to peer with Dr. Malon Kindle by email at [email or web address].

## 2017-06-06 ENCOUNTER — Telehealth: Payer: Self-pay | Admitting: Radiology

## 2017-06-06 NOTE — Telephone Encounter (Signed)
I called patient to advise. Left message for her to call me back.

## 2017-06-06 NOTE — Telephone Encounter (Signed)
I spoke to patient to advise her the doctor at [facility] has declined her referral. She was advised DrHarrison wants her to see Dr Sharol Given. I did tell her she may want to discuss with primary care, they may have another option

## 2017-06-10 ENCOUNTER — Ambulatory Visit (INDEPENDENT_AMBULATORY_CARE_PROVIDER_SITE_OTHER): Payer: Medicaid Other | Admitting: Orthopedic Surgery

## 2017-06-21 ENCOUNTER — Other Ambulatory Visit (INDEPENDENT_AMBULATORY_CARE_PROVIDER_SITE_OTHER): Payer: Self-pay | Admitting: Orthopedic Surgery

## 2017-06-25 NOTE — Telephone Encounter (Signed)
You started this medication for the pt in January and she was supposed to follow up in one week and did not come back. Do you want to refill this medication for pt for chronic pain syndrome?

## 2017-06-25 NOTE — Telephone Encounter (Signed)
Call patient, we can refill the cymbalta if its working, she needs to be following up with a doctor to manage her care

## 2017-06-26 NOTE — Telephone Encounter (Signed)
I called and lm on vm to advise of below.  

## 2017-07-30 ENCOUNTER — Encounter (HOSPITAL_COMMUNITY): Payer: Self-pay | Admitting: Emergency Medicine

## 2017-07-30 ENCOUNTER — Other Ambulatory Visit: Payer: Self-pay

## 2017-07-30 ENCOUNTER — Emergency Department (HOSPITAL_COMMUNITY)
Admission: EM | Admit: 2017-07-30 | Discharge: 2017-07-30 | Disposition: A | Discharge status: Home / Self Care | Payer: Medicaid Other | Attending: Emergency Medicine | Admitting: Emergency Medicine

## 2017-07-30 DIAGNOSIS — Z79899 Other long term (current) drug therapy: Secondary | ICD-10-CM | POA: Diagnosis not present

## 2017-07-30 DIAGNOSIS — Z87891 Personal history of nicotine dependence: Secondary | ICD-10-CM | POA: Insufficient documentation

## 2017-07-30 DIAGNOSIS — J45909 Unspecified asthma, uncomplicated: Secondary | ICD-10-CM | POA: Diagnosis not present

## 2017-07-30 DIAGNOSIS — R21 Rash and other nonspecific skin eruption: Secondary | ICD-10-CM

## 2017-07-30 MED ORDER — DEXAMETHASONE SODIUM PHOSPHATE 10 MG/ML IJ SOLN
10.0000 mg | Freq: Once | INTRAMUSCULAR | Status: AC
  Administered 2017-07-30: 10 mg via INTRAMUSCULAR
  Filled 2017-07-30: qty 1

## 2017-07-30 MED ORDER — HYDROXYZINE HCL 25 MG PO TABS: 25.0000 mg | ORAL_TABLET | Freq: Four times a day (QID) | ORAL | 0 refills | Status: DC

## 2017-07-30 NOTE — ED Triage Notes (Addendum)
Pt used a new soap at home causing a rash to face, torso and legs with itching. Benadryl taken at 5 am this morning.

## 2017-07-30 NOTE — ED Provider Notes (Signed)
[facility] EMERGENCY DEPARTMENT Provider Note   CSN: [id number] Arrival date & time: 07/30/17  1203     History   Chief Complaint Chief Complaint  Patient presents with  . Rash    HPI Valerie Huber is a 32 y.o. female who presents with a rash and itching.  She states that as soon as she used [employer] she developed itching around her torso.  Rash started to develop.  She has had extreme itching which is uncontrolled.  She has been taking Benadryl without significant relief.  She also had 1 dose of  left over prednisone but this has not provided significant relief.  She has used [employer] before but has never had this reaction.  She denies any other food or medicine allergies.  She has not had a fever.  She has not had any shortness of breath or wheezing.  HPI  Past Medical History:  Diagnosis Date  . Asthma   . Endometriosis   . Irregular periods 12/31/2014  . Pelvic pain in female 12/31/2014  . Pregnant 04/14/2015  . Second degree uterine prolapse 12/31/2014  . Seizures ([place])   . Vaginal Pap smear, abnormal     Patient Active Problem List   Diagnosis Date Noted  . Complex regional pain syndrome type 1 of right lower extremity 05/20/2017  . 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 Active labor at term 12/02/2015  . Active labor 12/01/2015  . Decreased appetite 11/16/2015  . Insufficient prenatal care 10/25/2015  . Marijuana use 07/05/2015  . Moderate dysplasia of cervix (CIN II)  at endocervical margin of LEEP specimen 01/22/2015  . Pelvic pain in female 12/31/2014  . Endometriosis determined by laparoscopy 12/31/2014  . Second degree uterine prolapse 12/31/2014  . Irregular periods 12/31/2014  . 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 Hematemesis with nausea     Past Surgical History:  Procedure Laterality Date  . ESOPHAGOGASTRODUODENOSCOPY N/A 12/10/2014   SLF: hematoemesis due to moderate erosive gastritis, duoedentitis, and duodenal ulcer  . LEEP    .  surgery for endometriosis  2005    OB History    Gravida Para Term Preterm AB Living   3 2 2   1 2    SAB TAB Ectopic Multiple Live Births     1   0 2       Home Medications    Prior to Admission medications   Medication Sig Start Date End Date Taking? Authorizing Provider  amitriptyline (ELAVIL) 25 MG tablet Take 1 tablet (25 mg total) by mouth at bedtime. 05/20/17   Newt Minion, MD  diazepam (VALIUM) 5 MG tablet Take 1 tablet (5 mg total) by mouth every 6 (six) hours as needed for anxiety. 04/17/17   Carole Civil, MD  DULoxetine (CYMBALTA) 30 MG capsule TAKE 1 CAPSULE(30 MG) BY MOUTH EVERY MORNING WITH BREAKFAST 06/26/17   Newt Minion, MD  EPINEPHrine (EPI-PEN) 0.3 mg/0.3 mL SOAJ injection Inject 0.3 mg into the muscle once. Reported on 11/23/2015    [provider]  gabapentin (NEURONTIN) 300 MG capsule Take 1 capsule (300 mg total) by mouth 3 (three) times daily. 3 times a day when necessary neuropathy pain 05/20/17   Newt Minion, MD  HYDROcodone-acetaminophen (NORCO) 5-325 MG tablet Take 1 tablet by mouth every 6 (six) hours as needed for moderate pain. 05/03/17   Carole Civil, MD  hydrOXYzine (ATARAX/VISTARIL) 25 MG tablet Take 1 tablet (25 mg total) by mouth every 6 (  six) hours. 07/30/17   Recardo Evangelist, PA-C  ketorolac (TORADOL) 10 MG tablet TK 1 T PO Q 6 H PRN P 05/17/17   [provider]    Family History Family History  Problem Relation Age of Onset  . Cancer Mother        kidney   . Asthma Mother   . Hypertension Mother   . Cancer Paternal Grandmother   . Colon cancer Neg Hx     Social History Social History   Tobacco Use  . Smoking status: Former Smoker    Packs/day: 0.25    Years: 14.00    Pack years: 3.50    Types: Cigarettes  . Smokeless tobacco: Never Used  . Tobacco comment: QUIT SMOKING ON JULY 11th. STATES WOULD ONLY HAVE A FEW CIGARETTES A WEEK PRIOR TO THIS.   Substance Use Topics  . Alcohol use: No     Alcohol/week: 0.0 oz    Comment: occ.  . Drug use: No     Allergies   Flexeril [cyclobenzaprine]; Ibuprofen; and Vicodin [hydrocodone-acetaminophen]   Review of Systems Review of Systems  Constitutional: Negative for fever.  Skin: Positive for rash.     Physical Exam Updated Vital Signs BP 132/86 (BP Location: Right Arm)   Pulse 92   Temp 99 F (37.2 C) (Oral)   Resp 20   Ht 5\' 4"  (1.626 m)   Wt 74.8 kg (165 lb)   SpO2 100%   BMI 28.32 kg/m   Physical Exam  Constitutional: She is oriented to person, place, and time. She appears well-developed and well-nourished. No distress.  Very uncomfortable appearing, pacing in room, scratching continuously  HENT:  Head: Normocephalic and atraumatic.  Eyes: Conjunctivae are normal. Pupils are equal, round, and reactive to light. Right eye exhibits no discharge. Left eye exhibits no discharge. No scleral icterus.  Neck: Normal range of motion.  Cardiovascular: Normal rate and regular rhythm.  Pulmonary/Chest: Effort normal and breath sounds normal. No respiratory distress.  Abdominal: She exhibits no distension.  Neurological: She is alert and oriented to person, place, and time.  Skin: Skin is warm and dry. Rash (Erythematous papular rash around her torso and back.) noted.  Psychiatric: She has a normal mood and affect. Her behavior is normal.  Nursing note and vitals reviewed.    ED Treatments / Results  Labs (all labs ordered are listed, but only abnormal results are displayed) Labs Reviewed - No data to display  EKG  EKG Interpretation None       Radiology No results found.  Procedures Procedures (including critical care time)  Medications Ordered in ED Medications  dexamethasone (DECADRON) injection 10 mg (10 mg Intramuscular Given 07/30/17 1230)     Initial Impression / Assessment and Plan / ED Course  I have reviewed the triage vital signs and the nursing notes.  Pertinent labs & imaging results that  were available during my care of the patient were reviewed by me and considered in my medical decision making (see chart for details).  32 year old female presents with a rash consistent with contact dermatitis.  No signs of a severe allergic reaction.  She was given a dose of IM Decadron.  She was given a prescription for Atarax.  Return precautions given.  Final Clinical Impressions(s) / ED Diagnoses   Final diagnoses:  Rash and nonspecific skin eruption    ED Discharge Orders        Ordered    hydrOXYzine (ATARAX/VISTARIL) 25 MG tablet  Every 6 hours     07/30/17 1224       Recardo Evangelist, Hershal Coria 07/30/17 1450    Fredia Sorrow, MD 07/31/17 [phone]

## 2017-07-30 NOTE — Discharge Instructions (Signed)
Please take Atarax for itching as directed Keep skin moisturized  Return if worsening

## 2017-08-02 ENCOUNTER — Encounter (HOSPITAL_COMMUNITY): Payer: Self-pay | Admitting: Emergency Medicine

## 2017-08-02 ENCOUNTER — Emergency Department (HOSPITAL_COMMUNITY): Payer: Medicaid Other

## 2017-08-02 ENCOUNTER — Other Ambulatory Visit: Payer: Self-pay

## 2017-08-02 ENCOUNTER — Emergency Department (HOSPITAL_COMMUNITY)
Admission: EM | Admit: 2017-08-02 | Discharge: 2017-08-02 | Disposition: A | Discharge status: Home / Self Care | Payer: Medicaid Other | Attending: Emergency Medicine | Admitting: Emergency Medicine

## 2017-08-02 DIAGNOSIS — S7012XA Contusion of left thigh, initial encounter: Secondary | ICD-10-CM | POA: Diagnosis not present

## 2017-08-02 DIAGNOSIS — Y999 Unspecified external cause status: Secondary | ICD-10-CM | POA: Diagnosis not present

## 2017-08-02 DIAGNOSIS — S20211A Contusion of right front wall of thorax, initial encounter: Secondary | ICD-10-CM | POA: Insufficient documentation

## 2017-08-02 DIAGNOSIS — S7011XA Contusion of right thigh, initial encounter: Secondary | ICD-10-CM | POA: Insufficient documentation

## 2017-08-02 DIAGNOSIS — J45909 Unspecified asthma, uncomplicated: Secondary | ICD-10-CM | POA: Insufficient documentation

## 2017-08-02 DIAGNOSIS — Z79899 Other long term (current) drug therapy: Secondary | ICD-10-CM | POA: Diagnosis not present

## 2017-08-02 DIAGNOSIS — S7010XA Contusion of unspecified thigh, initial encounter: Secondary | ICD-10-CM

## 2017-08-02 DIAGNOSIS — Z87891 Personal history of nicotine dependence: Secondary | ICD-10-CM | POA: Insufficient documentation

## 2017-08-02 DIAGNOSIS — Y92512 Supermarket, store or market as the place of occurrence of the external cause: Secondary | ICD-10-CM | POA: Diagnosis not present

## 2017-08-02 DIAGNOSIS — S79921A Unspecified injury of right thigh, initial encounter: Secondary | ICD-10-CM | POA: Diagnosis present

## 2017-08-02 DIAGNOSIS — Y9389 Activity, other specified: Secondary | ICD-10-CM | POA: Insufficient documentation

## 2017-08-02 LAB — I-STAT BETA HCG BLOOD, ED (MC, WL, AP ONLY): I-stat hCG, quantitative: <5 m[IU]/mL (ref <5)

## 2017-08-02 MED ORDER — OXYCODONE-ACETAMINOPHEN 5-325 MG PO TABS: 1.0000 | ORAL_TABLET | ORAL | 0 refills | Status: DC | PRN

## 2017-08-02 MED ORDER — ACETAMINOPHEN 325 MG PO TABS: 650.0000 mg | ORAL_TABLET | Freq: Once | ORAL | Status: DC

## 2017-08-02 NOTE — Discharge Instructions (Addendum)
Your x-rays are negative for any fractures or dislocations.  Your pain is secondary to soft tissue injury including bruising which should improve over the next 7-10 days.  You may use the medication prescribed if needed for pain relief.  This medication will make you drowsy so do not drive within 4 hours of taking it.  You may also benefit from ice packs applied to the areas that are most tender as much as it feels comfortable for the next 2 days.  After these 2 days you can also apply a heating pad for 20 minutes several times daily to those areas of bruising which can help resolve your symptoms quicker.
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
## 2017-08-08 ENCOUNTER — Encounter: Payer: Medicaid Other | Admitting: Obstetrics and Gynecology

## 2017-08-24 ENCOUNTER — Encounter (HOSPITAL_COMMUNITY): Payer: Self-pay | Admitting: Emergency Medicine

## 2017-08-24 ENCOUNTER — Emergency Department (HOSPITAL_COMMUNITY): Payer: Medicaid Other

## 2017-08-24 ENCOUNTER — Emergency Department (HOSPITAL_COMMUNITY)
Admission: EM | Admit: 2017-08-24 | Discharge: 2017-08-24 | Disposition: A | Discharge status: Home / Self Care | Payer: Medicaid Other | Attending: Emergency Medicine | Admitting: Emergency Medicine

## 2017-08-24 ENCOUNTER — Other Ambulatory Visit: Payer: Self-pay

## 2017-08-24 DIAGNOSIS — R0981 Nasal congestion: Secondary | ICD-10-CM | POA: Insufficient documentation

## 2017-08-24 DIAGNOSIS — J45909 Unspecified asthma, uncomplicated: Secondary | ICD-10-CM | POA: Insufficient documentation

## 2017-08-24 DIAGNOSIS — J029 Acute pharyngitis, unspecified: Secondary | ICD-10-CM | POA: Diagnosis not present

## 2017-08-24 DIAGNOSIS — J209 Acute bronchitis, unspecified: Secondary | ICD-10-CM | POA: Diagnosis not present

## 2017-08-24 DIAGNOSIS — E876 Hypokalemia: Secondary | ICD-10-CM | POA: Diagnosis not present

## 2017-08-24 DIAGNOSIS — J3489 Other specified disorders of nose and nasal sinuses: Secondary | ICD-10-CM | POA: Diagnosis not present

## 2017-08-24 DIAGNOSIS — R05 Cough: Secondary | ICD-10-CM | POA: Diagnosis present

## 2017-08-24 DIAGNOSIS — R112 Nausea with vomiting, unspecified: Secondary | ICD-10-CM | POA: Diagnosis not present

## 2017-08-24 DIAGNOSIS — Z87891 Personal history of nicotine dependence: Secondary | ICD-10-CM | POA: Insufficient documentation

## 2017-08-24 DIAGNOSIS — R51 Headache: Secondary | ICD-10-CM | POA: Diagnosis not present

## 2017-08-24 DIAGNOSIS — Z79899 Other long term (current) drug therapy: Secondary | ICD-10-CM | POA: Diagnosis not present

## 2017-08-24 DIAGNOSIS — R509 Fever, unspecified: Secondary | ICD-10-CM | POA: Insufficient documentation

## 2017-08-24 LAB — BASIC METABOLIC PANEL
ANION GAP: 9 (ref 5–15)
BUN: 11 mg/dL (ref 6–20)
CO2: 25 mmol/L (ref 22–32)
Calcium: 8.6 mg/dL — ABNORMAL LOW (ref 8.9–10.3)
Chloride: 106 mmol/L (ref 101–111)
Creatinine, Ser: 0.65 mg/dL (ref 0.44–1.00)
GFR calc Af Amer: >60 mL/min (ref >60)
GLUCOSE: 98 mg/dL (ref 65–99)
POTASSIUM: 2.9 mmol/L — AB (ref 3.5–5.1)
SODIUM: 140 mmol/L (ref 135–145)

## 2017-08-24 LAB — CBC WITH DIFFERENTIAL/PLATELET
BASOS ABS: 0 10*3/uL (ref 0.0–0.1)
Basophils Relative: 0 %
Eosinophils Absolute: 0.2 10*3/uL (ref 0.0–0.7)
Eosinophils Relative: 3 %
HEMATOCRIT: 33.4 % — AB (ref 36.0–46.0)
HEMOGLOBIN: 10.9 g/dL — AB (ref 12.0–15.0)
LYMPHS PCT: 49 %
Lymphs Abs: 2.6 10*3/uL (ref 0.7–4.0)
MCH: 30.9 pg (ref 26.0–34.0)
MCHC: 32.6 g/dL (ref 30.0–36.0)
MCV: 94.6 fL (ref 78.0–100.0)
Monocytes Absolute: 0.8 10*3/uL (ref 0.1–1.0)
Monocytes Relative: 15 %
NEUTROS ABS: 1.8 10*3/uL (ref 1.7–7.7)
NEUTROS PCT: 33 %
Platelets: 233 10*3/uL (ref 150–400)
RBC: 3.53 MIL/uL — AB (ref 3.87–5.11)
RDW: 12.6 % (ref 11.5–15.5)
WBC: 5.4 10*3/uL (ref 4.0–10.5)

## 2017-08-24 LAB — URINALYSIS, ROUTINE W REFLEX MICROSCOPIC
BILIRUBIN URINE: NEGATIVE
Bacteria, UA: NONE SEEN
GLUCOSE, UA: NEGATIVE mg/dL
Ketones, ur: 5 mg/dL — AB
LEUKOCYTES UA: NEGATIVE
NITRITE: NEGATIVE
PROTEIN: NEGATIVE mg/dL
SPECIFIC GRAVITY, URINE: 1.024 (ref 1.005–1.030)
pH: 6 (ref 5.0–8.0)

## 2017-08-24 LAB — LIPASE, BLOOD: Lipase: 31 U/L (ref 11–51)

## 2017-08-24 LAB — HCG, QUANTITATIVE, PREGNANCY

## 2017-08-24 MED ORDER — POTASSIUM CHLORIDE 20 MEQ PO PACK
20.0000 meq | PACK | Freq: Once | ORAL | Status: AC
  Administered 2017-08-24: 20 meq via ORAL
  Filled 2017-08-24: qty 1

## 2017-08-24 MED ORDER — BENZONATATE 100 MG PO CAPS: 100.0000 mg | ORAL_CAPSULE | Freq: Three times a day (TID) | ORAL | 0 refills | Status: DC

## 2017-08-24 MED ORDER — ONDANSETRON HCL 4 MG PO TABS: 4.0000 mg | ORAL_TABLET | Freq: Four times a day (QID) | ORAL | 0 refills | Status: DC

## 2017-08-24 MED ORDER — PREDNISONE 20 MG PO TABS
40.0000 mg | ORAL_TABLET | Freq: Once | ORAL | Status: AC
  Administered 2017-08-24: 40 mg via ORAL
  Filled 2017-08-24: qty 2

## 2017-08-24 MED ORDER — PREDNISONE 10 MG PO TABS: 20.0000 mg | ORAL_TABLET | Freq: Two times a day (BID) | ORAL | 0 refills | Status: DC

## 2017-08-24 MED ORDER — ONDANSETRON HCL 4 MG/2ML IJ SOLN
4.0000 mg | Freq: Once | INTRAMUSCULAR | Status: AC
  Administered 2017-08-24: 4 mg via INTRAVENOUS
  Filled 2017-08-24: qty 2

## 2017-08-24 MED ORDER — SODIUM CHLORIDE 0.9 % IV SOLN
INTRAVENOUS | Status: DC
  Administered 2017-08-24: 18:00:00 via INTRAVENOUS

## 2017-08-24 NOTE — ED Triage Notes (Signed)
Cough, sore throat and congestion since yesterday

## 2017-08-24 NOTE — ED Notes (Signed)
HN in to reassess and discuss findings

## 2017-08-24 NOTE — ED Notes (Signed)
Family member request room be mopped, and disinfectant sprayed in room  Housekeeping informed and immediately down to do as requested

## 2017-08-24 NOTE — Discharge Instructions (Addendum)
Continue to take your prednisone you said you have at home. Follow up with your doctor on Monday for recheck. Return here for worsening symptoms.

## 2017-08-24 NOTE — ED Notes (Signed)
Reports flu like sx since yesterday  Cough, ST, aches  No flu shot  Allergic to ibuprofen  Took tylenol 1 hr ago

## 2017-08-24 NOTE — ED Notes (Signed)
OOB to bathroom

## 2017-08-24 NOTE — ED Provider Notes (Signed)
[facility] Provider Note   CSN: [id number] Arrival date & time: 08/24/17  1642     History   Chief Complaint Chief Complaint  Patient presents with  . Cough    HPI Valerie Huber is a 32 y.o. female who presents to the ED for a cough and sore throat that started yesterday. Today patient reports nausea and vomiting started today and she has vomited several times.   HPI  Past Medical History:  Diagnosis Date  . Asthma   . Endometriosis   . Irregular periods 12/31/2014  . Pelvic pain in female 12/31/2014  . Pregnant 04/14/2015  . Second degree uterine prolapse 12/31/2014  . Seizures ([place])   . Vaginal Pap smear, abnormal     Patient Active Problem List   Diagnosis Date Noted  . Complex regional pain syndrome type 1 of right lower extremity 05/20/2017  . 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 Active labor at term 12/02/2015  . Active labor 12/01/2015  . Decreased appetite 11/16/2015  . Insufficient prenatal care 10/25/2015  . Marijuana use 07/05/2015  . Moderate dysplasia of cervix (CIN II)  at endocervical margin of LEEP specimen 01/22/2015  . Pelvic pain in female 12/31/2014  . Endometriosis determined by laparoscopy 12/31/2014  . Second degree uterine prolapse 12/31/2014  . Irregular periods 12/31/2014  . 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 Hematemesis with nausea     Past Surgical History:  Procedure Laterality Date  . ESOPHAGOGASTRODUODENOSCOPY N/A 12/10/2014   SLF: hematoemesis due to moderate erosive gastritis, duoedentitis, and duodenal ulcer  . LEEP    . surgery for endometriosis  2005     OB History    Gravida  3   Para  2   Term  2   Preterm      AB  1   Living  2     SAB      TAB  1   Ectopic      Multiple  0   Live Births  2            Home Medications    Prior to Admission medications   Medication Sig Start Date End Date Taking? Authorizing Provider  amitriptyline (ELAVIL) 25 MG tablet  Take 1 tablet (25 mg total) by mouth at bedtime. 05/20/17   Newt Minion, MD  benzonatate (TESSALON) 100 MG capsule Take 1 capsule (100 mg total) by mouth every 8 (eight) hours. 08/24/17   Ashley Murrain, NP  diazepam (VALIUM) 5 MG tablet Take 1 tablet (5 mg total) by mouth every 6 (six) hours as needed for anxiety. 04/17/17   Carole Civil, MD  DULoxetine (CYMBALTA) 30 MG capsule TAKE 1 CAPSULE(30 MG) BY MOUTH EVERY MORNING WITH BREAKFAST 06/26/17   Newt Minion, MD  EPINEPHrine (EPI-PEN) 0.3 mg/0.3 mL SOAJ injection Inject 0.3 mg into the muscle once. Reported on 11/23/2015    [provider]  gabapentin (NEURONTIN) 300 MG capsule Take 1 capsule (300 mg total) by mouth 3 (three) times daily. 3 times a day when necessary neuropathy pain 05/20/17   Newt Minion, MD  HYDROcodone-acetaminophen (NORCO) 5-325 MG tablet Take 1 tablet by mouth every 6 (six) hours as needed for moderate pain. 05/03/17   Carole Civil, MD  hydrOXYzine (ATARAX/VISTARIL) 25 MG tablet Take 1 tablet (25 mg total) by mouth every 6 (six) hours. 07/30/17   Recardo Evangelist, PA-C  ketorolac (TORADOL) 10 MG tablet TK 1 T  PO Q 6 H PRN P 05/17/17   [provider]  ondansetron (ZOFRAN) 4 MG tablet Take 1 tablet (4 mg total) by mouth every 6 (six) hours. 08/24/17   Ashley Murrain, NP  oxyCODONE-acetaminophen (PERCOCET/ROXICET) 5-325 MG tablet Take 1 tablet by mouth every 4 (four) hours as needed. 08/02/17   Evalee Jefferson, PA-C    Family History Family History  Problem Relation Age of Onset  . Cancer Mother        kidney   . Asthma Mother   . Hypertension Mother   . Cancer Paternal Grandmother   . Colon cancer Neg Hx     Social History Social History   Tobacco Use  . Smoking status: Former Smoker    Packs/day: 0.25    Years: 14.00    Pack years: 3.50    Types: Cigarettes  . Smokeless tobacco: Never Used  . Tobacco comment: QUIT SMOKING ON JULY 11th. STATES WOULD ONLY HAVE A FEW CIGARETTES A WEEK  PRIOR TO THIS.   Substance Use Topics  . Alcohol use: No    Alcohol/week: 0.0 oz  . Drug use: No     Allergies   Flexeril [cyclobenzaprine]; Ibuprofen; and Vicodin [hydrocodone-acetaminophen]   Review of Systems Review of Systems  Constitutional: Positive for chills and fever.  HENT: Positive for congestion and sore throat.   Eyes: Negative for redness and itching.  Respiratory: Positive for cough.   Cardiovascular: Negative for chest pain.  Gastrointestinal: Positive for abdominal pain, nausea and vomiting.  Genitourinary: Negative for dysuria, frequency and urgency.  Musculoskeletal: Positive for myalgias.  Skin: Negative for rash.  Neurological: Positive for headaches. Negative for syncope.  Psychiatric/Behavioral: Negative for confusion.     Physical Exam Updated Vital Signs BP 124/77 (BP Location: Left Arm)   Pulse 90   Temp 99.5 F (37.5 C) (Oral)   Resp 16   Ht 5\' 4"  (1.626 m)   Wt 70.3 kg (155 lb)   LMP 07/31/2017   SpO2 100%   BMI 26.61 kg/m   Physical Exam  Constitutional: She appears well-developed and well-nourished. No distress.  HENT:  Head: Normocephalic.  Right Ear: Tympanic membrane normal.  Left Ear: Tympanic membrane normal.  Nose: Rhinorrhea present.  Mouth/Throat: Uvula is midline and mucous membranes are normal. No oropharyngeal exudate, posterior oropharyngeal edema or tonsillar abscesses. Posterior oropharyngeal erythema: mild.  Eyes: Pupils are equal, round, and reactive to light. Conjunctivae and EOM are normal.  Neck: Neck supple.  Cardiovascular: Normal rate and regular rhythm.  Pulmonary/Chest: Effort normal. She has no wheezes. She has no rales.  Abdominal: Soft. Bowel sounds are normal. There is no tenderness.  Musculoskeletal: Normal range of motion.  Neurological: She is alert.  Skin: Skin is warm and dry.  Psychiatric: She has a normal mood and affect. Her behavior is normal.  Nursing note and vitals reviewed.    ED  Treatments / Results  Labs (all labs ordered are listed, but only abnormal results are displayed) Labs Reviewed  BASIC METABOLIC PANEL - Abnormal; Notable for the following components:      Result Value   Potassium 2.9 (*)    Calcium 8.6 (*)    All other components within normal limits  CBC WITH DIFFERENTIAL/PLATELET - Abnormal; Notable for the following components:   RBC 3.53 (*)    Hemoglobin 10.9 (*)    HCT 33.4 (*)    All other components within normal limits  URINALYSIS, ROUTINE W REFLEX MICROSCOPIC - Abnormal; Notable  for the following components:   Hgb urine dipstick SMALL (*)    Ketones, ur 5 (*)    Squamous Epithelial / LPF 0-5 (*)    All other components within normal limits  LIPASE, BLOOD  HCG, QUANTITATIVE, PREGNANCY   Radiology Dg Chest 2 View  Result Date: 08/24/2017 CLINICAL DATA:  Cough with sore throat. EXAM: CHEST - 2 VIEW COMPARISON:  08/02/2017 FINDINGS: The heart size and mediastinal contours are within normal limits. Both lungs are clear. The visualized skeletal structures are unremarkable. IMPRESSION: No active cardiopulmonary disease. Electronically Signed   By: Ashley Royalty M.D.   On: 08/24/2017 19:56    Procedures Procedures (including critical care time)  Medications Ordered in ED Medications  0.9 %  sodium chloride infusion ( Intravenous New Bag/Given 08/24/17 1810)  ondansetron (ZOFRAN) injection 4 mg (4 mg Intravenous Given 08/24/17 1810)  potassium chloride (KLOR-CON) packet 20 mEq (20 mEq Oral Given 08/24/17 2047)  predniSONE (DELTASONE) tablet 40 mg (40 mg Oral Given 08/24/17 2158)     Initial Impression / Assessment and Plan / ED Course  I have reviewed the triage vital signs and the nursing notes. Patient feeling better after medications and IV fluids, n/v has resolved. Will treat for bronchitis and give Rx for Zofran. Patient appears stable for d/c and encouraged to f/u with PCP in 2 days or return here sooner for worsening symptoms.   Final  Clinical Impressions(s) / ED Diagnoses   Final diagnoses:  Acute bronchitis, unspecified organism  Nausea and vomiting in adult patient  Hypokalemia   I had written patient Rx for prednisone for bronchitis but she reports she is already taking prednisone so I told her to continue as directed.  ED Discharge Orders        Ordered    ondansetron (ZOFRAN) 4 MG tablet  Every 6 hours     08/24/17 2122    benzonatate (TESSALON) 100 MG capsule  Every 8 hours     08/24/17 2122    predniSONE (DELTASONE) 10 MG tablet  2 times daily with meals,   Status:  Discontinued     08/24/17 2122       Debroah Baller [place], NP 08/24/17 2683    Julianne Rice, MD 08/24/17 [phone]

## 2017-09-02 ENCOUNTER — Ambulatory Visit: Payer: Medicaid Other | Admitting: Obstetrics and Gynecology

## 2017-09-02 ENCOUNTER — Encounter: Payer: Self-pay | Admitting: Obstetrics and Gynecology

## 2017-09-02 VITALS — BP 114/66 | HR 78 | Ht 64.0 in | Wt 154.0 lb

## 2017-09-02 DIAGNOSIS — Z8742 Personal history of other diseases of the female genital tract: Secondary | ICD-10-CM | POA: Diagnosis not present

## 2017-09-02 DIAGNOSIS — Z3009 Encounter for other general counseling and advice on contraception: Secondary | ICD-10-CM | POA: Insufficient documentation

## 2017-09-02 DIAGNOSIS — N946 Dysmenorrhea, unspecified: Secondary | ICD-10-CM | POA: Diagnosis not present

## 2017-09-02 MED ORDER — ONDANSETRON 4 MG PO TBDP: 4.0000 mg | ORAL_TABLET | Freq: Four times a day (QID) | ORAL | 1 refills | Status: DC | PRN

## 2017-09-02 NOTE — Progress Notes (Signed)
[facility] Visit  @DATE @            Patient name: Valerie Huber MRN [id number]  Date of birth: 1985-10-09  CC & HPI:  Valerie Huber is a [id number] 32 y.o. female presenting today for further evaluation of her dysmenorrhea. She notes that she doesn't know much about a BTL, however she is interested in this method of sterilization. Pt has not tried any medications for the relief of her symptoms. Her menstrual cycles have been heavy and last 7-9 day, off 4 days, and then back on and this could occur up to 2 times a month. She reports that her appetite increased on Provera. She has two children ages 45 and 24. She denies any other symptoms. She voices that she would not like an IUD.     ROS:  ROS  +dysmenorrhea All systems are negative except as noted in the HPI and PMH.    Pertinent History Reviewed:   Reviewed: Significant for endometriosis, irregular periods, pelvic pain Medical         Past Medical History:  Diagnosis Date  . Asthma   . Endometriosis   . Irregular periods 12/31/2014  . Pelvic pain in female 12/31/2014  . Pregnant 04/14/2015  . Second degree uterine prolapse 12/31/2014  . Seizures ([place])   . Vaginal Pap smear, abnormal                               Surgical Hx:    Past Surgical History:  Procedure Laterality Date  . ESOPHAGOGASTRODUODENOSCOPY N/A 12/10/2014   SLF: hematoemesis due to moderate erosive gastritis, duoedentitis, and duodenal ulcer  . LEEP    . surgery for endometriosis  2005   Medications: Reviewed & Updated - see associated section                       Current Outpatient Medications:  .  amitriptyline (ELAVIL) 25 MG tablet, Take 1 tablet (25 mg total) by mouth at bedtime. (Patient not taking: Reported on 09/02/2017), Disp: 30 tablet, Rfl: 3 .  benzonatate (TESSALON) 100 MG capsule, Take 1 capsule (100 mg total) by mouth every 8 (eight) hours. (Patient not taking: Reported on 09/02/2017), Disp: 21 capsule, Rfl: 0 .  diazepam (VALIUM) 5  MG tablet, Take 1 tablet (5 mg total) by mouth every 6 (six) hours as needed for anxiety. (Patient not taking: Reported on 09/02/2017), Disp: 60 tablet, Rfl: 0 .  DULoxetine (CYMBALTA) 30 MG capsule, TAKE 1 CAPSULE(30 MG) BY MOUTH EVERY MORNING WITH BREAKFAST (Patient not taking: Reported on 09/02/2017), Disp: 30 capsule, Rfl: 0 .  EPINEPHrine (EPI-PEN) 0.3 mg/0.3 mL SOAJ injection, Inject 0.3 mg into the muscle once. Reported on 11/23/2015, Disp: , Rfl:  .  gabapentin (NEURONTIN) 300 MG capsule, Take 1 capsule (300 mg total) by mouth 3 (three) times daily. 3 times a day when necessary neuropathy pain (Patient not taking: Reported on 09/02/2017), Disp: 90 capsule, Rfl: 3 .  HYDROcodone-acetaminophen (NORCO) 5-325 MG tablet, Take 1 tablet by mouth every 6 (six) hours as needed for moderate pain. (Patient not taking: Reported on 09/02/2017), Disp: 30 tablet, Rfl: 0 .  hydrOXYzine (ATARAX/VISTARIL) 25 MG tablet, Take 1 tablet (25 mg total) by mouth every 6 (six) hours. (Patient not taking: Reported on 09/02/2017), Disp: 12 tablet, Rfl: 0 .  ketorolac (TORADOL) 10 MG tablet, TK 1 T PO Q 6 H PRN  P, Disp: , Rfl: 0 .  ondansetron (ZOFRAN) 4 MG tablet, Take 1 tablet (4 mg total) by mouth every 6 (six) hours. (Patient not taking: Reported on 09/02/2017), Disp: 20 tablet, Rfl: 0 .  oxyCODONE-acetaminophen (PERCOCET/ROXICET) 5-325 MG tablet, Take 1 tablet by mouth every 4 (four) hours as needed. (Patient not taking: Reported on 09/02/2017), Disp: 15 tablet, Rfl: 0   Social History: Reviewed -  reports that she has quit smoking. Her smoking use included cigarettes. She has a 3.50 pack-year smoking history. She has never used smokeless tobacco.  Objective Findings:  Vitals: Blood pressure 114/66, pulse 78, height 5\' 4"  (1.626 m), weight 154 lb (69.9 kg), last menstrual period 08/13/2017, not currently breastfeeding.  Discussion: 1.Discussed with pt risks and benefits of BTL. Discussed using clips only vs bilateral  salpingectomy. Advised pt that bilateral salpingectomy is a permanent procedure, but reduces the risk of cancer by 2/3.   2. Also discussed risks and benefits of endometrial ablation. Advised pt that intrauterine pregnancy is nearly 100% preventable with ablation. Discussed reduced risk of ectopic pregnancy and reduced risk of ovarian cancer from 1 in 100 to 1 in 300 with bilateral salpingectomy in a lengthy conversation with risks benefits rationale and alternatives including IUD reviewed. Brochures given.   At end of discussion, pt had opportunity to ask questions and has no further questions at this time.   Specific discussion of endometrial ablation and permanent sterilization of bilateral tubal ligation as noted above. Greater than 50% was spent in counseling and coordination of care with the patient.   Total time greater than: 25 minutes.     At end of discussion, pt had opportunity to ask questions and has no further questions at this time.    Assessment & Plan:   A:  1. Dysmenorrhea 2. Hx of endometriosis 3. Initial meeting for contraceptive counseling  P:  1.  F/u in 3 weeks for pre-op 2. BTL papers signed today

## 2017-09-30 ENCOUNTER — Ambulatory Visit (INDEPENDENT_AMBULATORY_CARE_PROVIDER_SITE_OTHER): Payer: Medicaid Other | Admitting: Obstetrics and Gynecology

## 2017-09-30 ENCOUNTER — Other Ambulatory Visit: Payer: Self-pay | Admitting: Obstetrics and Gynecology

## 2017-09-30 ENCOUNTER — Other Ambulatory Visit: Payer: Self-pay

## 2017-09-30 ENCOUNTER — Encounter: Payer: Self-pay | Admitting: Obstetrics and Gynecology

## 2017-09-30 VITALS — BP 100/60 | HR 69 | Ht 63.0 in | Wt 163.0 lb

## 2017-09-30 DIAGNOSIS — N946 Dysmenorrhea, unspecified: Secondary | ICD-10-CM | POA: Diagnosis not present

## 2017-09-30 DIAGNOSIS — Z01818 Encounter for other preprocedural examination: Secondary | ICD-10-CM

## 2017-09-30 DIAGNOSIS — Z113 Encounter for screening for infections with a predominantly sexual mode of transmission: Secondary | ICD-10-CM

## 2017-09-30 DIAGNOSIS — Z302 Encounter for sterilization: Secondary | ICD-10-CM | POA: Diagnosis not present

## 2017-09-30 NOTE — Progress Notes (Signed)
Patient ID: Valerie Huber, female   DOB: 01-Jul-1985, 32 y.o.   MRN: [id number] Preoperative History and Physical  Aaron Boeh is a 32 y.o. [id number] here for surgical management of dysmenorrhea and permanent sterilization. She was given information on endometrial ablation and would prefer not to have her menstrual cycle going forward. She is currently on her menstrual cycle. She denies fever, chills or any other symptoms or complaints at this time.   No significant preoperative concerns.  Proposed surgery: Hysteroscopy D&C, Endometrial Ablation and Bilateral Tubal Ligation (Fallope Rings.)  Past Medical History:  Diagnosis Date  . Asthma   . Endometriosis   . Irregular periods 12/31/2014  . Pelvic pain in female 12/31/2014  . Pregnant 04/14/2015  . Second degree uterine prolapse 12/31/2014  . Seizures ([place])   . Vaginal Pap smear, abnormal    Past Surgical History:  Procedure Laterality Date  . ESOPHAGOGASTRODUODENOSCOPY N/A 12/10/2014   SLF: hematoemesis due to moderate erosive gastritis, duoedentitis, and duodenal ulcer  . LEEP    . surgery for endometriosis  2005   OB History  Gravida Para Term Preterm AB Living  3 2 2   1 2   SAB TAB Ectopic Multiple Live Births    1   0 2    # Outcome Date GA Lbr Len/2nd Weight Sex Delivery Anes PTL Lv  3 Term 12/02/15 [redacted]w[redacted]d 03:20 / 00:20 6 lb 12.5 oz (3.075 kg) M Vag-Spont EPI  LIV  2 Term 10/07/05 [redacted]w[redacted]d  7 lb 5 oz (3.317 kg) F Vag-Spont EPI N LIV  1 TAB           Patient denies any other pertinent gynecologic issues.   Current Outpatient Medications on File Prior to Visit  Medication Sig Dispense Refill  . EPINEPHrine (EPI-PEN) 0.3 mg/0.3 mL SOAJ injection Inject 0.3 mg into the muscle once. Reported on 11/23/2015     No current facility-administered medications on file prior to visit.    Allergies  Allergen Reactions  . Flexeril [Cyclobenzaprine] Hives  . Ibuprofen Other (See Comments)    "If I take 2-3 during the day it causes my  stomach to become upset" hives also  . Vicodin [Hydrocodone-Acetaminophen]     Itching     Social History:   reports that she has quit smoking. Her smoking use included cigarettes. She has a 3.50 pack-year smoking history. She has never used smokeless tobacco. She reports that she does not drink alcohol or use drugs.  Family History  Problem Relation Age of Onset  . Cancer Mother        kidney   . Asthma Mother   . Hypertension Mother   . Cancer Paternal Grandmother   . Colon cancer Neg Hx     Review of Systems: Noncontributory  PHYSICAL EXAM: Blood pressure 100/60, pulse 69, height 5\' 3"  (1.6 m), weight 163 lb (73.9 kg), last menstrual period 09/30/2017, not currently breastfeeding. General appearance - alert, well appearing, and in no distress Chest - clear to auscultation, no wheezes, rales or rhonchi, symmetric air entry Heart - normal rate and regular rhythm Abdomen - soft, nontender, nondistended, no masses or organomegaly                     Pelvic - examination  External genitalia - normal Vagina - heavy mensies at present Uterus - retroverted nl ssc Adnexa - negative  Extremities - peripheral pulses normal, no pedal edema, no clubbing or cyanosis  Labs: No results found  for this or any previous visit (from the past 336 hour(s)).  Imaging Studies: No results found.  Assessment: 1. Hx of endometriosis Patient Active Problem List   Diagnosis Date Noted  . Encounter for counseling regarding contraception 09/02/2017  . Complex regional pain syndrome type 1 of right lower extremity 05/20/2017  . 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 Active labor at term 12/02/2015  . Active labor 12/01/2015  . Decreased appetite 11/16/2015  . Insufficient prenatal care 10/25/2015  . Marijuana use 07/05/2015  . Moderate dysplasia of cervix (CIN II)  at endocervical margin of LEEP specimen 01/22/2015  . Pelvic pain in female 12/31/2014  . Endometriosis determined by laparoscopy  12/31/2014  . Second degree uterine prolapse 12/31/2014  . Irregular periods 12/31/2014  . 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 Hematemesis with nausea     Plan: 1. Patient will undergo surgical management with Hysteroscopy D&C, Endometrial Ablation and Bilateral Tubal Ligation.(fallope rings)  2. Surgery scheduled for 10/08/2017, papers signed 09/02/17 3. F/u 3 weeks for Post-op   By signing my name below, I, Margit Banda, attest that this documentation has been prepared under the direction and in the presence of [name], MD. Electronically Signed: Margit Banda, Medical Scribe. 09/30/17. 10:40 AM.  I personally performed the services described in this documentation, which was SCRIBED in my presence. The recorded information has been reviewed and considered accurate. It has been edited as necessary during review. [name], MD

## 2017-09-30 NOTE — Progress Notes (Signed)
Patient ID: Valerie Huber, female   DOB: 1985/11/20, 32 y.o.   MRN: [id number] Preoperative History and Physical  Valerie Huber is a 32 y.o. [id number] here for surgical management of dysmenorrhea and permanent sterilization. She was given information on endometrial ablation and would prefer not to have her menstrual cycle going forward. She is currently on her menstrual cycle. She denies fever, chills or any other symptoms or complaints at this time.   No significant preoperative concerns.  Proposed surgery: Hysteroscopy D&C, Endometrial Ablation and Bilateral Tubal Ligation (Fallope Rings.)      Past Medical History:  Diagnosis Date  . Asthma   . Endometriosis   . Irregular periods 12/31/2014  . Pelvic pain in female 12/31/2014  . Pregnant 04/14/2015  . Second degree uterine prolapse 12/31/2014  . Seizures ([place])   . Vaginal Pap smear, abnormal         Past Surgical History:  Procedure Laterality Date  . ESOPHAGOGASTRODUODENOSCOPY N/A 12/10/2014   SLF: hematoemesis due to moderate erosive gastritis, duoedentitis, and duodenal ulcer  . LEEP    . surgery for endometriosis  2005                   OB History  Gravida Para Term Preterm AB Living  3 2 2   1 2   SAB TAB Ectopic Multiple Live Births       1   0 2       # Outcome Date GA Lbr Len/2nd Weight Sex Delivery Anes PTL Lv  3 Term 12/02/15 [redacted]w[redacted]d 03:20 / 00:20 6 lb 12.5 oz (3.075 kg) M Vag-Spont EPI  LIV  2 Term 10/07/05 [redacted]w[redacted]d  7 lb 5 oz (3.317 kg) F Vag-Spont EPI N LIV  1 TAB           Patient denies any other pertinent gynecologic issues.         Current Outpatient Medications on File Prior to Visit  Medication Sig Dispense Refill  . EPINEPHrine (EPI-PEN) 0.3 mg/0.3 mL SOAJ injection Inject 0.3 mg into the muscle once. Reported on 11/23/2015     No current facility-administered medications on file prior to visit.         Allergies  Allergen Reactions  . Flexeril [Cyclobenzaprine] Hives  .  Ibuprofen Other (See Comments)    "If I take 2-3 during the day it causes my stomach to become upset" hives also  . Vicodin [Hydrocodone-Acetaminophen]     Itching     Social History:   reports that she has quit smoking. Her smoking use included cigarettes. She has a 3.50 pack-year smoking history. She has never used smokeless tobacco. She reports that she does not drink alcohol or use drugs.       Family History  Problem Relation Age of Onset  . Cancer Mother        kidney   . Asthma Mother   . Hypertension Mother   . Cancer Paternal Grandmother   . Colon cancer Neg Hx     Review of Systems: Noncontributory  PHYSICAL EXAM: Blood pressure 100/60, pulse 69, height 5\' 3"  (1.6 m), weight 163 lb (73.9 kg), last menstrual period 09/30/2017, not currently breastfeeding. General appearance - alert, well appearing, and in no distress Chest - clear to auscultation, no wheezes, rales or rhonchi, symmetric air entry Heart - normal rate and regular rhythm Abdomen - soft, nontender, nondistended, no masses or organomegaly  Pelvic - examination  External genitalia - normal Vagina - heavy mensies at present Uterus - retroverted nl ssc Adnexa - negative  Extremities - peripheral pulses normal, no pedal edema, no clubbing or cyanosis  Labs: No results found for this or any previous visit (from the past 336 hour(s)).  Imaging Studies: ImagingResults  No results found.    Assessment: 1. Hx of endometriosis     Patient Active Problem List   Diagnosis Date Noted  . Encounter for counseling regarding contraception 09/02/2017  . Complex regional pain syndrome type 1 of right lower extremity 05/20/2017  . 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 Active labor at term 12/02/2015  . Active labor 12/01/2015  . Decreased appetite 11/16/2015  . Insufficient prenatal care 10/25/2015  . Marijuana use 07/05/2015  . Moderate dysplasia of cervix (CIN II)   at endocervical margin of LEEP specimen 01/22/2015  . Pelvic pain in female 12/31/2014  . Endometriosis determined by laparoscopy 12/31/2014  . Second degree uterine prolapse 12/31/2014  . Irregular periods 12/31/2014  . 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 Hematemesis with nausea     Plan: 1. Patient will undergo surgical management with Hysteroscopy D&C, Endometrial Ablation and Bilateral Tubal Ligation.(fallope rings)  2. Surgery scheduled for 10/08/2017, papers signed 09/02/17 3. F/u 3 weeks for Post-op   By signing my name below, I, Margit Banda, attest that this documentation has been prepared under the direction and in the presence of [name], MD. Electronically Signed: Margit Banda, Medical Scribe. 09/30/17. 10:40 AM.  I personally performed the services described in this documentation, which was SCRIBED in my presence. The recorded information has been reviewed and considered accurate. It has been edited as necessary during review. [name], MD

## 2017-10-01 NOTE — Patient Instructions (Signed)
Your procedure is scheduled on: 10/08/2017  Report to [facility] at   11:30  AM.  Call this number if you have problems the morning of surgery: [phone]   Remember:   Do not drink or eat food:After Midnight.  :  Take these medicines the morning of surgery with A SIP OF WATER:    Do not wear jewelry, make-up or nail polish.  Do not wear lotions, powders, or perfumes. You may wear deodorant.  Do not shave 48 hours prior to surgery. Men may shave face and neck.  Do not bring valuables to the hospital.  Contacts, dentures or bridgework may not be worn into surgery.  Leave suitcase in the car. After surgery it may be brought to your room.  For patients admitted to the hospital, checkout time is 11:00 AM the day of discharge.   Patients discharged the day of surgery will not be allowed to drive home.    Special Instructions: Shower using CHG night before surgery and shower the day of surgery use CHG.  Use special wash - you have one bottle of CHG for all showers.  You should use approximately 1/2 of the bottle for each shower.  Dilation and Curettage, Care After These instructions give you information about caring for yourself after your procedure. Your doctor may also give you more specific instructions. Call your doctor if you have any problems or questions after your procedure. Follow these instructions at home: Activity  Do not drive or use heavy machinery while taking prescription pain medicine.  For 24 hours after your procedure, avoid driving.  Take short walks often, followed by rest periods. Ask your doctor what activities are safe for you. After one or two days, you may be able to return to your normal activities.  Do not lift anything that is heavier than 10 lb (4.5 kg) until your doctor approves.  For at least 2 weeks, or as long as told by your doctor: ? Do not douche. ? Do not use tampons. ? Do not have sex. General instructions  Take over-the-counter and  prescription medicines only as told by your doctor. This is very important if you take blood thinning medicine.  Do not take baths, swim, or use a hot tub until your doctor approves. Take showers instead of baths.  Wear compression stockings as told by your doctor.  It is up to you to get the results of your procedure. Ask your doctor when your results will be ready.  Keep all follow-up visits as told by your doctor. This is important. Contact a doctor if:  You have very bad cramps that get worse or do not get better with medicine.  You have very bad pain in your belly (abdomen).  You cannot drink fluids without throwing up (vomiting).  You get pain in a different part of the area between your belly and thighs (pelvis).  You have bad-smelling discharge from your vagina.  You have a rash. Get help right away if:  You are bleeding a lot from your vagina. A lot of bleeding means soaking more than one sanitary pad in an hour, for 2 hours in a row.  You have clumps of blood (blood clots) coming from your vagina.  You have a fever or chills.  Your belly feels very tender or hard.  You have chest pain.  You have trouble breathing.  You cough up blood.  You feel dizzy.  You feel light-headed.  You pass out (faint).  You have pain in your neck or shoulder area. Summary  Take short walks often, followed by rest periods. Ask your doctor what activities are safe for you. After one or two days, you may be able to return to your normal activities.  Do not lift anything that is heavier than 10 lb (4.5 kg) until your doctor approves.  Do not take baths, swim, or use a hot tub until your doctor approves. Take showers instead of baths.  Contact your doctor if you have any symptoms of infection, like bad-smelling discharge from your vagina. This information is not intended to replace advice given to you by your health care provider. Make sure you discuss any questions you have with  your health care provider. Document Released: 02/07/2008 Document Revised: 01/16/2016 Document Reviewed: 01/16/2016 Elsevier Interactive Patient Education  2017 [place].  Endometrial Ablation Endometrial ablation is a procedure that destroys the thin inner layer of the lining of the uterus (endometrium). This procedure may be done:  To stop heavy periods.  To stop bleeding that is causing anemia.  To control irregular bleeding.  To treat bleeding caused by small tumors (fibroids) in the endometrium.  This procedure is often an alternative to major surgery, such as removal of the uterus and cervix (hysterectomy). As a result of this procedure:  You may not be able to have children. However, if you are premenopausal (you have not gone through menopause): ? You may still have a small chance of getting pregnant. ? You will need to use a reliable method of birth control after the procedure to prevent pregnancy.  You may stop having a menstrual period, or you may have only a small amount of bleeding during your period. Menstruation may return several years after the procedure.  Tell a health care provider about:  Any allergies you have.  All medicines you are taking, including vitamins, herbs, eye drops, creams, and over-the-counter medicines.  Any problems you or family members have had with the use of anesthetic medicines.  Any blood disorders you have.  Any surgeries you have had.  Any medical conditions you have. What are the risks? Generally, this is a safe procedure. However, problems may occur, including:  A hole (perforation) in the uterus or bowel.  Infection of the uterus, bladder, or vagina.  Bleeding.  Damage to other structures or organs.  An air bubble in the lung (air embolus).  Problems with pregnancy after the procedure.  Failure of the procedure.  Decreased ability to diagnose cancer in the endometrium.  What happens before the  procedure?  You will have tests of your endometrium to make sure there are no pre-cancerous cells or cancer cells present.  You may have an ultrasound of the uterus.  You may be given medicines to thin the endometrium.  Ask your health care provider about: ? Changing or stopping your regular medicines. This is especially important if you take diabetes medicines or blood thinners. ? Taking medicines such as aspirin and ibuprofen. These medicines can thin your blood. Do not take these medicines before your procedure if your doctor tells you not to.  Plan to have someone take you home from the hospital or clinic. What happens during the procedure?  You will lie on an exam table with your feet and legs supported as in a pelvic exam.  To lower your risk of infection: ? Your health care team will wash or sanitize their hands and put on germ-free (sterile) gloves. ? Your genital area  will be washed with soap.  An IV tube will be inserted into one of your veins.  You will be given a medicine to help you relax (sedative).  A surgical instrument with a light and camera (resectoscope) will be inserted into your vagina and moved into your uterus. This allows your surgeon to see inside your uterus.  Endometrial tissue will be removed using one of the following methods: ? Radiofrequency. This method uses a radiofrequency-alternating electric current to remove the endometrium. ? Cryotherapy. This method uses extreme cold to freeze the endometrium. ? Heated-free liquid. This method uses a heated saltwater (saline) solution to remove the endometrium. ? Microwave. This method uses high-energy microwaves to heat up the endometrium and remove it. ? Thermal balloon. This method involves inserting a catheter with a balloon tip into the uterus. The balloon tip is filled with heated fluid to remove the endometrium. The procedure may vary among health care providers and hospitals. What happens after the  procedure?  Your blood pressure, heart rate, breathing rate, and blood oxygen level will be monitored until the medicines you were given have worn off.  As tissue healing occurs, you may notice vaginal bleeding for 4-6 weeks after the procedure. You may also experience: ? Cramps. ? Thin, watery vaginal discharge that is light pink or brown in color. ? A need to urinate more frequently than usual. ? Nausea.  Do not drive for 24 hours if you were given a sedative.  Do not have sex or insert anything into your vagina until your health care provider approves. Summary  Endometrial ablation is done to treat the many causes of heavy menstrual bleeding.  The procedure may be done only after medications have been tried to control the bleeding.  Plan to have someone take you home from the hospital or clinic. This information is not intended to replace advice given to you by your health care provider. Make sure you discuss any questions you have with your health care provider. Document Released: 03/09/2004 Document Revised: 05/17/2016 Document Reviewed: 05/17/2016 Elsevier Interactive Patient Education  2017 [employer]  Laparoscopic Tubal Ligation, Care After Refer to this sheet in the next few weeks. These instructions provide you with information about caring for yourself after your procedure. Your health care provider may also give you more specific instructions. Your treatment has been planned according to current medical practices, but problems sometimes occur. Call your health care provider if you have any problems or questions after your procedure. What can I expect after the procedure? After the procedure, it is common to have:  A sore throat.  Discomfort in your shoulder.  Mild discomfort or cramping in your abdomen.  Gas pains.  Pain or soreness in the area where the surgical cut (incision) was made.  A bloated feeling.  Tiredness.  Nausea.  Vomiting.  Follow these  instructions at home: Medicines  Take over-the-counter and prescription medicines only as told by your health care provider.  Do not take aspirin because it can cause bleeding.  Do not drive or operate heavy machinery while taking prescription pain medicine. Activity  Rest for the rest of the day.  Return to your normal activities as told by your health care provider. Ask your health care provider what activities are safe for you. Incision care   Follow instructions from your health care provider about how to take care of your incision. Make sure you: ? Wash your hands with soap and water before you change your bandage (dressing).  If soap and water are not available, use hand sanitizer. ? Change your dressing as told by your health care provider. ? Leave stitches (sutures) in place. They may need to stay in place for 2 weeks or longer.  Check your incision area every day for signs of infection. Check for: ? More redness, swelling, or pain. ? More fluid or blood. ? Warmth. ? Pus or a bad smell. Other Instructions  Do not take baths, swim, or use a hot tub until your health care provider approves. You may take showers.  Keep all follow-up visits as told by your health care provider. This is important.  Have someone help you with your daily household tasks for the first few days. Contact a health care provider if:  You have more redness, swelling, or pain around your incision.  Your incision feels warm to the touch.  You have pus or a bad smell coming from your incision.  The edges of your incision break open after the sutures have been removed.  Your pain does not improve after 2-3 days.  You have a rash.  You repeatedly become dizzy or light-headed.  Your pain medicine is not helping.  You are constipated. Get help right away if:  You have a fever.  You faint.  You have increasing pain in your abdomen.  You have severe pain in one or both of your  shoulders.  You have fluid or blood coming from your sutures or from your vagina.  You have shortness of breath or difficulty breathing.  You have chest pain or leg pain.  You have ongoing nausea, vomiting, or diarrhea. This information is not intended to replace advice given to you by your health care provider. Make sure you discuss any questions you have with your health care provider. Document Released: 11/17/2004 Document Revised: 10/03/2015 Document Reviewed: 04/10/2015 Elsevier Interactive Patient Education  2018 [place] Anesthesia, Adult, Care After These instructions provide you with information about caring for yourself after your procedure. Your health care provider may also give you more specific instructions. Your treatment has been planned according to current medical practices, but problems sometimes occur. Call your health care provider if you have any problems or questions after your procedure. What can I expect after the procedure? After the procedure, it is common to have:  Vomiting.  A sore throat.  Mental slowness.  It is common to feel:  Nauseous.  Cold or shivery.  Sleepy.  Tired.  Sore or achy, even in parts of your body where you did not have surgery.  Follow these instructions at home: For at least 24 hours after the procedure:  Do not: ? Participate in activities where you could fall or become injured. ? Drive. ? Use heavy machinery. ? Drink alcohol. ? Take sleeping pills or medicines that cause drowsiness. ? Make important decisions or sign legal documents. ? Take care of children on your own.  Rest. Eating and drinking  If you vomit, drink water, juice, or soup when you can drink without vomiting.  Drink enough fluid to keep your urine clear or pale yellow.  Make sure you have little or no nausea before eating solid foods.  Follow the diet recommended by your health care provider. General instructions  Have a  responsible adult stay with you until you are awake and alert.  Return to your normal activities as told by your health care provider. Ask your health care provider what activities are safe for you.  Take  over-the-counter and prescription medicines only as told by your health care provider.  If you smoke, do not smoke without supervision.  Keep all follow-up visits as told by your health care provider. This is important. Contact a health care provider if:  You continue to have nausea or vomiting at home, and medicines are not helpful.  You cannot drink fluids or start eating again.  You cannot urinate after 8-12 hours.  You develop a skin rash.  You have fever.  You have increasing redness at the site of your procedure. Get help right away if:  You have difficulty breathing.  You have chest pain.  You have unexpected bleeding.  You feel that you are having a life-threatening or urgent problem. This information is not intended to replace advice given to you by your health care provider. Make sure you discuss any questions you have with your health care provider. Document Released: 08/06/2000 Document Revised: 10/03/2015 Document Reviewed: 04/14/2015 Elsevier Interactive Patient Education  [employer].

## 2017-10-02 ENCOUNTER — Encounter (HOSPITAL_COMMUNITY)
Admission: RE | Admit: 2017-10-02 | Discharge: 2017-10-02 | Disposition: A | Discharge status: Home / Self Care | Payer: Medicaid Other | Source: Ambulatory Visit | Attending: Obstetrics and Gynecology | Admitting: Obstetrics and Gynecology

## 2017-10-02 ENCOUNTER — Other Ambulatory Visit: Payer: Self-pay

## 2017-10-02 ENCOUNTER — Encounter (HOSPITAL_COMMUNITY): Payer: Self-pay

## 2017-10-02 DIAGNOSIS — Z01812 Encounter for preprocedural laboratory examination: Secondary | ICD-10-CM | POA: Insufficient documentation

## 2017-10-02 LAB — CBC
HCT: 35.6 % — ABNORMAL LOW (ref 36.0–46.0)
Hemoglobin: 11.6 g/dL — ABNORMAL LOW (ref 12.0–15.0)
MCH: 31 pg (ref 26.0–34.0)
MCHC: 32.6 g/dL (ref 30.0–36.0)
MCV: 95.2 fL (ref 78.0–100.0)
PLATELETS: 252 10*3/uL (ref 150–400)
RBC: 3.74 MIL/uL — ABNORMAL LOW (ref 3.87–5.11)
RDW: 12 % (ref 11.5–15.5)
WBC: 4.2 10*3/uL (ref 4.0–10.5)

## 2017-10-02 LAB — URINALYSIS, ROUTINE W REFLEX MICROSCOPIC
BILIRUBIN URINE: NEGATIVE
Bacteria, UA: NONE SEEN
GLUCOSE, UA: NEGATIVE mg/dL
KETONES UR: NEGATIVE mg/dL
LEUKOCYTES UA: NEGATIVE
Nitrite: NEGATIVE
PH: 6 (ref 5.0–8.0)
PROTEIN: NEGATIVE mg/dL
Specific Gravity, Urine: 1.014 (ref 1.005–1.030)

## 2017-10-02 LAB — COMPREHENSIVE METABOLIC PANEL
ALT: 15 U/L (ref 14–54)
AST: 15 U/L (ref 15–41)
Albumin: 3.7 g/dL (ref 3.5–5.0)
Alkaline Phosphatase: 45 U/L (ref 38–126)
Anion gap: 7 (ref 5–15)
BUN: 8 mg/dL (ref 6–20)
CHLORIDE: 103 mmol/L (ref 101–111)
CO2: 26 mmol/L (ref 22–32)
CREATININE: 0.63 mg/dL (ref 0.44–1.00)
Calcium: 8.9 mg/dL (ref 8.9–10.3)
GFR calc non Af Amer: >60 mL/min (ref >60)
Glucose, Bld: 117 mg/dL — ABNORMAL HIGH (ref 65–99)
POTASSIUM: 3.8 mmol/L (ref 3.5–5.1)
SODIUM: 136 mmol/L (ref 135–145)
Total Bilirubin: 0.4 mg/dL (ref 0.3–1.2)
Total Protein: 6.7 g/dL (ref 6.5–8.1)

## 2017-10-02 LAB — GC/CHLAMYDIA PROBE AMP
CHLAMYDIA, DNA PROBE: NEGATIVE
Neisseria gonorrhoeae by PCR: NEGATIVE

## 2017-10-02 LAB — HCG, SERUM, QUALITATIVE: PREG SERUM: NEGATIVE

## 2017-10-08 ENCOUNTER — Other Ambulatory Visit: Payer: Self-pay

## 2017-10-08 ENCOUNTER — Encounter (HOSPITAL_COMMUNITY)
Admission: RE | Disposition: A | Discharge status: Home / Self Care | Payer: Self-pay | Source: Ambulatory Visit | Attending: Obstetrics and Gynecology

## 2017-10-08 ENCOUNTER — Ambulatory Visit (HOSPITAL_COMMUNITY): Payer: Medicaid Other | Admitting: Anesthesiology

## 2017-10-08 ENCOUNTER — Ambulatory Visit (HOSPITAL_COMMUNITY)
Admission: RE | Admit: 2017-10-08 | Discharge: 2017-10-08 | Disposition: A | Discharge status: Home / Self Care | Payer: Medicaid Other | Source: Ambulatory Visit | Attending: Obstetrics and Gynecology | Admitting: Obstetrics and Gynecology

## 2017-10-08 ENCOUNTER — Encounter (HOSPITAL_COMMUNITY): Payer: Self-pay | Admitting: Anesthesiology

## 2017-10-08 DIAGNOSIS — Z302 Encounter for sterilization: Secondary | ICD-10-CM | POA: Insufficient documentation

## 2017-10-08 DIAGNOSIS — J45909 Unspecified asthma, uncomplicated: Secondary | ICD-10-CM | POA: Insufficient documentation

## 2017-10-08 DIAGNOSIS — N946 Dysmenorrhea, unspecified: Secondary | ICD-10-CM | POA: Insufficient documentation

## 2017-10-08 DIAGNOSIS — N920 Excessive and frequent menstruation with regular cycle: Secondary | ICD-10-CM | POA: Insufficient documentation

## 2017-10-08 DIAGNOSIS — R569 Unspecified convulsions: Secondary | ICD-10-CM | POA: Insufficient documentation

## 2017-10-08 DIAGNOSIS — Z87891 Personal history of nicotine dependence: Secondary | ICD-10-CM | POA: Insufficient documentation

## 2017-10-08 DIAGNOSIS — Z885 Allergy status to narcotic agent status: Secondary | ICD-10-CM | POA: Diagnosis not present

## 2017-10-08 DIAGNOSIS — Z886 Allergy status to analgesic agent status: Secondary | ICD-10-CM | POA: Insufficient documentation

## 2017-10-08 HISTORY — PX: TUBAL LIGATION: SHX77

## 2017-10-08 HISTORY — PX: DILITATION & CURRETTAGE/HYSTROSCOPY WITH NOVASURE ABLATION: SHX5568

## 2017-10-08 SURGERY — DILATATION & CURETTAGE/HYSTEROSCOPY WITH NOVASURE ABLATION
Anesthesia: General

## 2017-10-08 MED ORDER — PROPOFOL 10 MG/ML IV BOLUS
INTRAVENOUS | Status: AC
  Filled 2017-10-08: qty 20

## 2017-10-08 MED ORDER — OXYCODONE HCL 5 MG PO TABS: 5.0000 mg | ORAL_TABLET | ORAL | 0 refills | Status: DC | PRN

## 2017-10-08 MED ORDER — DEXAMETHASONE SODIUM PHOSPHATE 4 MG/ML IJ SOLN
INTRAMUSCULAR | Status: DC | PRN
  Administered 2017-10-08: 8 mg via INTRAVENOUS

## 2017-10-08 MED ORDER — SUGAMMADEX SODIUM 200 MG/2ML IV SOLN
INTRAVENOUS | Status: AC
  Filled 2017-10-08: qty 2

## 2017-10-08 MED ORDER — HYDROMORPHONE HCL 1 MG/ML IJ SOLN
INTRAMUSCULAR | Status: AC
  Filled 2017-10-08: qty 1

## 2017-10-08 MED ORDER — ROCURONIUM BROMIDE 50 MG/5ML IV SOLN
INTRAVENOUS | Status: AC
  Filled 2017-10-08: qty 1

## 2017-10-08 MED ORDER — HYDROMORPHONE HCL 1 MG/ML IJ SOLN
INTRAMUSCULAR | Status: AC
  Filled 2017-10-08: qty 0.5

## 2017-10-08 MED ORDER — FENTANYL CITRATE (PF) 100 MCG/2ML IJ SOLN
25.0000 ug | INTRAMUSCULAR | Status: DC | PRN
  Administered 2017-10-08 (×3): 50 ug via INTRAVENOUS
  Filled 2017-10-08 (×2): qty 2

## 2017-10-08 MED ORDER — MIDAZOLAM HCL 2 MG/2ML IJ SOLN
INTRAMUSCULAR | Status: AC
  Filled 2017-10-08: qty 2

## 2017-10-08 MED ORDER — FENTANYL CITRATE (PF) 100 MCG/2ML IJ SOLN
INTRAMUSCULAR | Status: DC | PRN
  Administered 2017-10-08 (×4): 50 ug via INTRAVENOUS
  Administered 2017-10-08 (×2): 25 ug via INTRAVENOUS

## 2017-10-08 MED ORDER — ROCURONIUM BROMIDE 100 MG/10ML IV SOLN
INTRAVENOUS | Status: DC | PRN
  Administered 2017-10-08: 5 mg via INTRAVENOUS
  Administered 2017-10-08: 35 mg via INTRAVENOUS

## 2017-10-08 MED ORDER — HYDROMORPHONE HCL 1 MG/ML IJ SOLN
0.5000 mg | INTRAMUSCULAR | Status: AC | PRN
  Administered 2017-10-08 (×4): 0.5 mg via INTRAVENOUS
  Filled 2017-10-08: qty 0.5

## 2017-10-08 MED ORDER — SUGAMMADEX SODIUM 200 MG/2ML IV SOLN
INTRAVENOUS | Status: DC | PRN
  Administered 2017-10-08: 150 mg via INTRAVENOUS

## 2017-10-08 MED ORDER — SODIUM CHLORIDE 0.9 % IJ SOLN
INTRAMUSCULAR | Status: AC
  Filled 2017-10-08: qty 10

## 2017-10-08 MED ORDER — LACTATED RINGERS IV SOLN
INTRAVENOUS | Status: DC | PRN
  Administered 2017-10-08 (×2): via INTRAVENOUS

## 2017-10-08 MED ORDER — HYDROMORPHONE HCL 1 MG/ML IJ SOLN
0.5000 mg | INTRAMUSCULAR | Status: DC | PRN
  Administered 2017-10-08 (×2): 0.5 mg via INTRAVENOUS

## 2017-10-08 MED ORDER — ROCURONIUM BROMIDE 50 MG/5ML IV SOLN
INTRAVENOUS | Status: AC
Start: 2017-10-08 — End: ?
  Filled 2017-10-08: qty 1

## 2017-10-08 MED ORDER — SODIUM CHLORIDE 0.9 % IR SOLN
Status: DC | PRN
  Administered 2017-10-08: 3000 mL

## 2017-10-08 MED ORDER — CEFAZOLIN SODIUM-DEXTROSE 2-4 GM/100ML-% IV SOLN
2.0000 g | INTRAVENOUS | Status: AC
  Administered 2017-10-08: 2 g via INTRAVENOUS

## 2017-10-08 MED ORDER — LIDOCAINE HCL (PF) 1 % IJ SOLN
INTRAMUSCULAR | Status: AC
  Filled 2017-10-08: qty 5

## 2017-10-08 MED ORDER — DEXAMETHASONE SODIUM PHOSPHATE 4 MG/ML IJ SOLN
INTRAMUSCULAR | Status: AC
  Filled 2017-10-08: qty 2

## 2017-10-08 MED ORDER — LACTATED RINGERS IV SOLN
INTRAVENOUS | Status: DC
  Administered 2017-10-08: 08:00:00 via INTRAVENOUS

## 2017-10-08 MED ORDER — BUPIVACAINE-EPINEPHRINE (PF) 0.5% -1:200000 IJ SOLN
INTRAMUSCULAR | Status: DC | PRN
  Administered 2017-10-08: 9 mL
  Administered 2017-10-08: 21 mL

## 2017-10-08 MED ORDER — BUPIVACAINE HCL (PF) 0.5 % IJ SOLN
INTRAMUSCULAR | Status: AC
  Filled 2017-10-08: qty 30

## 2017-10-08 MED ORDER — PHENYLEPHRINE 40 MCG/ML (10ML) SYRINGE FOR IV PUSH (FOR BLOOD PRESSURE SUPPORT)
PREFILLED_SYRINGE | INTRAVENOUS | Status: AC
  Filled 2017-10-08: qty 10

## 2017-10-08 MED ORDER — PROPOFOL 10 MG/ML IV BOLUS
INTRAVENOUS | Status: DC | PRN
  Administered 2017-10-08: 140 mg via INTRAVENOUS

## 2017-10-08 MED ORDER — GLYCOPYRROLATE 0.2 MG/ML IJ SOLN
INTRAMUSCULAR | Status: DC | PRN
  Administered 2017-10-08: 0.1 mg via INTRAVENOUS

## 2017-10-08 MED ORDER — MIDAZOLAM HCL 2 MG/2ML IJ SOLN
INTRAMUSCULAR | Status: DC | PRN
  Administered 2017-10-08 (×2): 1 mg via INTRAVENOUS

## 2017-10-08 MED ORDER — EPHEDRINE SULFATE 50 MG/ML IJ SOLN
INTRAMUSCULAR | Status: AC
  Filled 2017-10-08: qty 1

## 2017-10-08 MED ORDER — PHENYLEPHRINE HCL 10 MG/ML IJ SOLN
INTRAMUSCULAR | Status: DC | PRN
  Administered 2017-10-08: 40 ug via INTRAVENOUS

## 2017-10-08 MED ORDER — 0.9 % SODIUM CHLORIDE (POUR BTL) OPTIME
TOPICAL | Status: DC | PRN
  Administered 2017-10-08: 1000 mL

## 2017-10-08 MED ORDER — LIDOCAINE HCL (CARDIAC) PF 100 MG/5ML IV SOSY
PREFILLED_SYRINGE | INTRAVENOUS | Status: DC | PRN
  Administered 2017-10-08: 50 mg via INTRAVENOUS

## 2017-10-08 MED ORDER — ONDANSETRON HCL 4 MG/2ML IJ SOLN
INTRAMUSCULAR | Status: DC | PRN
  Administered 2017-10-08: 4 mg via INTRAVENOUS

## 2017-10-08 MED ORDER — CEFAZOLIN SODIUM-DEXTROSE 2-4 GM/100ML-% IV SOLN
INTRAVENOUS | Status: AC
Start: 2017-10-08 — End: ?
  Filled 2017-10-08: qty 100

## 2017-10-08 MED ORDER — BUPIVACAINE-EPINEPHRINE (PF) 0.5% -1:200000 IJ SOLN
INTRAMUSCULAR | Status: AC
  Filled 2017-10-08: qty 30

## 2017-10-08 MED ORDER — FENTANYL CITRATE (PF) 250 MCG/5ML IJ SOLN
INTRAMUSCULAR | Status: AC
  Filled 2017-10-08: qty 5

## 2017-10-08 SURGICAL SUPPLY — 38 items
BANDAGE STRIP 1X3 FLEXIBLE (GAUZE/BANDAGES/DRESSINGS) ×8 IMPLANT
BLADE 11 SAFETY STRL DISP (BLADE) ×4 IMPLANT
CLOSURE WOUND 1/4 X3 (GAUZE/BANDAGES/DRESSINGS) ×1
CLOTH BEACON ORANGE TIMEOUT ST (SAFETY) ×4 IMPLANT
COVER LIGHT HANDLE STERIS (MISCELLANEOUS) ×8 IMPLANT
DECANTER SPIKE VIAL GLASS SM (MISCELLANEOUS) ×4 IMPLANT
DURAPREP 26ML APPLICATOR (WOUND CARE) ×4 IMPLANT
GAUZE SPONGE 4X4 12PLY STRL (GAUZE/BANDAGES/DRESSINGS) ×4 IMPLANT
GLOVE BIOGEL PI IND STRL 7.0 (GLOVE) ×6 IMPLANT
GLOVE BIOGEL PI IND STRL 9 (GLOVE) ×2 IMPLANT
GLOVE BIOGEL PI INDICATOR 7.0 (GLOVE) ×6
GLOVE BIOGEL PI INDICATOR 9 (GLOVE) ×2
GLOVE ECLIPSE 6.5 STRL STRAW (GLOVE) ×4 IMPLANT
GLOVE ECLIPSE 9.0 STRL (GLOVE) ×4 IMPLANT
GOWN SPEC L3 XXLG W/TWL (GOWN DISPOSABLE) ×4 IMPLANT
GOWN STRL REUS W/TWL LRG LVL3 (GOWN DISPOSABLE) ×4 IMPLANT
HANDPIECE ABLA MINERVA ENDO (MISCELLANEOUS) ×4 IMPLANT
INST SET HYSTEROSCOPY (KITS) ×4 IMPLANT
IV NS IRRIG 3000ML ARTHROMATIC (IV SOLUTION) ×4 IMPLANT
KIT TURNOVER CYSTO (KITS) ×4 IMPLANT
KIT TURNOVER KIT A (KITS) ×4 IMPLANT
MANIFOLD NEPTUNE II (INSTRUMENTS) ×4 IMPLANT
NEEDLE HYPO 25X1 1.5 SAFETY (NEEDLE) ×4 IMPLANT
NEEDLE INSUFFLATION 14GA 120MM (NEEDLE) ×4 IMPLANT
NS IRRIG 1000ML POUR BTL (IV SOLUTION) ×4 IMPLANT
PACK PERI GYN (CUSTOM PROCEDURE TRAY) ×4 IMPLANT
PAD ARMBOARD 7.5X6 YLW CONV (MISCELLANEOUS) ×4 IMPLANT
PAD TELFA 3X4 1S STER (GAUZE/BANDAGES/DRESSINGS) ×4 IMPLANT
RING FALLOPIAN BANDS (Ring) ×4 IMPLANT
SET BASIN LINEN APH (SET/KITS/TRAYS/PACK) ×4 IMPLANT
SET IRRIG Y TYPE TUR BLADDER L (SET/KITS/TRAYS/PACK) ×4 IMPLANT
SOLUTION ANTI FOG 6CC (MISCELLANEOUS) ×4 IMPLANT
STRIP CLOSURE SKIN 1/4X3 (GAUZE/BANDAGES/DRESSINGS) ×3 IMPLANT
SUT VIC AB 4-0 PS2 27 (SUTURE) ×4 IMPLANT
SYR BULB IRRIGATION 50ML (SYRINGE) ×4 IMPLANT
SYR CONTROL 10ML LL (SYRINGE) ×4 IMPLANT
TROCAR XCEL NON BLADE 8MM B8LT (ENDOMECHANICALS) ×4 IMPLANT
TROCAR XCEL NON-BLD 5MMX100MML (ENDOMECHANICALS) ×4 IMPLANT

## 2017-10-08 NOTE — Discharge Instructions (Signed)
Endometrial Ablation Endometrial ablation is a procedure that destroys the thin inner layer of the lining of the uterus (endometrium). This procedure may be done:  To stop heavy periods.  To stop bleeding that is causing anemia.  To control irregular bleeding.  To treat bleeding caused by small tumors (fibroids) in the endometrium.  This procedure is often an alternative to major surgery, such as removal of the uterus and cervix (hysterectomy). As a result of this procedure:  You may not be able to have children. However, if you are premenopausal (you have not gone through menopause): ? You may still have a small chance of getting pregnant. ? You will need to use a reliable method of birth control after the procedure to prevent pregnancy.  You may stop having a menstrual period, or you may have only a small amount of bleeding during your period. Menstruation may return several years after the procedure.  Tell a health care provider about:  Any allergies you have.  All medicines you are taking, including vitamins, herbs, eye drops, creams, and over-the-counter medicines.  Any problems you or family members have had with the use of anesthetic medicines.  Any blood disorders you have.  Any surgeries you have had.  Any medical conditions you have. What are the risks? Generally, this is a safe procedure. However, problems may occur, including:  A hole (perforation) in the uterus or bowel.  Infection of the uterus, bladder, or vagina.  Bleeding.  Damage to other structures or organs.  An air bubble in the lung (air embolus).  Problems with pregnancy after the procedure.  Failure of the procedure.  Decreased ability to diagnose cancer in the endometrium.  What happens before the procedure?  You will have tests of your endometrium to make sure there are no pre-cancerous cells or cancer cells present.  You may have an ultrasound of the uterus.  You may be given  medicines to thin the endometrium.  Ask your health care provider about: ? Changing or stopping your regular medicines. This is especially important if you take diabetes medicines or blood thinners. ? Taking medicines such as aspirin and ibuprofen. These medicines can thin your blood. Do not take these medicines before your procedure if your doctor tells you not to.  Plan to have someone take you home from the hospital or clinic. What happens during the procedure?  You will lie on an exam table with your feet and legs supported as in a pelvic exam.  To lower your risk of infection: ? Your health care team will wash or sanitize their hands and put on germ-free (sterile) gloves. ? Your genital area will be washed with soap.  An IV tube will be inserted into one of your veins.  You will be given a medicine to help you relax (sedative).  A surgical instrument with a light and camera (resectoscope) will be inserted into your vagina and moved into your uterus. This allows your surgeon to see inside your uterus.  Endometrial tissue will be removed using one of the following methods: ? Radiofrequency. This method uses a radiofrequency-alternating electric current to remove the endometrium. ? Cryotherapy. This method uses extreme cold to freeze the endometrium. ? Heated-free liquid. This method uses a heated saltwater (saline) solution to remove the endometrium. ? Microwave. This method uses high-energy microwaves to heat up the endometrium and remove it. ? Thermal balloon. This method involves inserting a catheter with a balloon tip into the uterus. The balloon tip is  filled with heated fluid to remove the endometrium. The procedure may vary among health care providers and hospitals. What happens after the procedure?  Your blood pressure, heart rate, breathing rate, and blood oxygen level will be monitored until the medicines you were given have worn off.  As tissue healing occurs, you may  notice vaginal bleeding for 4-6 weeks after the procedure. You may also experience: ? Cramps. ? Thin, watery vaginal discharge that is light pink or brown in color. ? A need to urinate more frequently than usual. ? Nausea.  Do not drive for 24 hours if you were given a sedative.  Do not have sex or insert anything into your vagina until your health care provider approves. Summary  Endometrial ablation is done to treat the many causes of heavy menstrual bleeding.  The procedure may be done only after medications have been tried to control the bleeding.  Plan to have someone take you home from the hospital or clinic. This information is not intended to replace advice given to you by your health care provider. Make sure you discuss any questions you have with your health care provider. Document Released: 03/09/2004 Document Revised: 05/17/2016 Document Reviewed: 05/17/2016 Elsevier Interactive Patient Education  2017 [place] Anesthesia, Adult, Care After These instructions provide you with information about caring for yourself after your procedure. Your health care provider may also give you more specific instructions. Your treatment has been planned according to current medical practices, but problems sometimes occur. Call your health care provider if you have any problems or questions after your procedure. What can I expect after the procedure? After the procedure, it is common to have:  Vomiting.  A sore throat.  Mental slowness.  It is common to feel:  Nauseous.  Cold or shivery.  Sleepy.  Tired.  Sore or achy, even in parts of your body where you did not have surgery.  Follow these instructions at home: For at least 24 hours after the procedure:  Do not: ? Participate in activities where you could fall or become injured. ? Drive. ? Use heavy machinery. ? Drink alcohol. ? Take sleeping pills or medicines that cause drowsiness. ? Make  important decisions or sign legal documents. ? Take care of children on your own.  Rest. Eating and drinking  If you vomit, drink water, juice, or soup when you can drink without vomiting.  Drink enough fluid to keep your urine clear or pale yellow.  Make sure you have little or no nausea before eating solid foods.  Follow the diet recommended by your health care provider. General instructions  Have a responsible adult stay with you until you are awake and alert.  Return to your normal activities as told by your health care provider. Ask your health care provider what activities are safe for you.  Take over-the-counter and prescription medicines only as told by your health care provider.  If you smoke, do not smoke without supervision.  Keep all follow-up visits as told by your health care provider. This is important. Contact a health care provider if:  You continue to have nausea or vomiting at home, and medicines are not helpful.  You cannot drink fluids or start eating again.  You cannot urinate after 8-12 hours.  You develop a skin rash.  You have fever.  You have increasing redness at the site of your procedure. Get help right away if:  You have difficulty breathing.  You have chest  pain.  You have unexpected bleeding.  You feel that you are having a life-threatening or urgent problem. This information is not intended to replace advice given to you by your health care provider. Make sure you discuss any questions you have with your health care provider. Document Released: 08/06/2000 Document Revised: 10/03/2015 Document Reviewed: 04/14/2015 Elsevier Interactive Patient Education  [employer].

## 2017-10-08 NOTE — H&P (Signed)
Patient ID: Valerie Huber, female   DOB: 01/12/1986, 32 y.o.   MRN: [id number] Preoperative History and Physical  Valerie Huber is a 32 y.o. [id number] here for surgical management of dysmenorrhea and permanent sterilization. She was given information on endometrial ablation and would prefer not to have her menstrual cycle going forward. She is currently on her menstrual cycle. She denies fever, chills or any other symptoms or complaints at this time.   No significant preoperative concerns.  Proposed surgery: Hysteroscopy D&C, Endometrial Ablation and Bilateral Tubal Ligation (Fallope Rings.)      Past Medical History:  Diagnosis Date  . Asthma   . Endometriosis   . Irregular periods 12/31/2014  . Pelvic pain in female 12/31/2014  . Pregnant 04/14/2015  . Second degree uterine prolapse 12/31/2014  . Seizures ([place])   . Vaginal Pap smear, abnormal         Past Surgical History:  Procedure Laterality Date  . ESOPHAGOGASTRODUODENOSCOPY N/A 12/10/2014   SLF: hematoemesis due to moderate erosive gastritis, duoedentitis, and duodenal ulcer  . LEEP    . surgery for endometriosis  2005                   OB History  Gravida Para Term Preterm AB Living  3 2 2   1 2   SAB TAB Ectopic Multiple Live Births       1   0 2       # Outcome Date GA Lbr Len/2nd Weight Sex Delivery Anes PTL Lv  3 Term 12/02/15 [redacted]w[redacted]d 03:20 / 00:20 6 lb 12.5 oz (3.075 kg) M Vag-Spont EPI  LIV  2 Term 10/07/05 [redacted]w[redacted]d  7 lb 5 oz (3.317 kg) F Vag-Spont EPI N LIV  1 TAB           Patient denies any other pertinent gynecologic issues.         Current Outpatient Medications on File Prior to Visit  Medication Sig Dispense Refill  . EPINEPHrine (EPI-PEN) 0.3 mg/0.3 mL SOAJ injection Inject 0.3 mg into the muscle once. Reported on 11/23/2015     No current facility-administered medications on file prior to visit.         Allergies  Allergen Reactions  . Flexeril [Cyclobenzaprine] Hives   . Ibuprofen Other (See Comments)    "If I take 2-3 during the day it causes my stomach to become upset" hives also  . Vicodin [Hydrocodone-Acetaminophen]     Itching     Social History:   reports that she has quit smoking. Her smoking use included cigarettes. She has a 3.50 pack-year smoking history. She has never used smokeless tobacco. She reports that she does not drink alcohol or use drugs.       Family History  Problem Relation Age of Onset  . Cancer Mother        kidney   . Asthma Mother   . Hypertension Mother   . Cancer Paternal Grandmother   . Colon cancer Neg Hx     Review of Systems: Noncontributory  PHYSICAL EXAM: Blood pressure 100/60, pulse 69, height 5\' 3"  (1.6 m), weight 163 lb (73.9 kg), last menstrual period 09/30/2017, not currently breastfeeding. General appearance - alert, well appearing, and in no distress Chest - clear to auscultation, no wheezes, rales or rhonchi, symmetric air entry Heart - normal rate and regular rhythm Abdomen - soft, nontender, nondistended, no masses or organomegaly  Pelvic - examination  External genitalia - normal Vagina - heavy mensies at present Uterus - retroverted nl ssc Adnexa - negative  Extremities - peripheral pulses normal, no pedal edema, no clubbing or cyanosis  Labs: CBC    Component Value Date/Time   WBC 4.2 10/02/2017 1140   RBC 3.74 (L) 10/02/2017 1140   HGB 11.6 (L) 10/02/2017 1140   HGB 10.4 (L) 10/20/2015 0938   HCT 35.6 (L) 10/02/2017 1140   HCT 31.3 (L) 10/20/2015 0938   PLT 252 10/02/2017 1140   PLT 217 10/20/2015 0938   MCV 95.2 10/02/2017 1140   MCV 93 10/20/2015 0938   MCH 31.0 10/02/2017 1140   MCHC 32.6 10/02/2017 1140   RDW 12.0 10/02/2017 1140   RDW 14.0 10/20/2015 0938   LYMPHSABS 2.6 08/24/2017 1805   MONOABS 0.8 08/24/2017 1805   EOSABS 0.2 08/24/2017 1805   BASOSABS 0.0 08/24/2017 1805     Imaging Studies: ImagingResults  No results  found.    Assessment: 1. Hx of endometriosis     Patient Active Problem List   Diagnosis Date Noted  . Encounter for counseling regarding contraception 09/02/2017  . Complex regional pain syndrome type 1 of right lower extremity 05/20/2017  . 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 Active labor at term 12/02/2015  . Active labor 12/01/2015  . Decreased appetite 11/16/2015  . Insufficient prenatal care 10/25/2015  . Marijuana use 07/05/2015  . Moderate dysplasia of cervix (CIN II)  at endocervical margin of LEEP specimen 01/22/2015  . Pelvic pain in female 12/31/2014  . Endometriosis determined by laparoscopy 12/31/2014  . Second degree uterine prolapse 12/31/2014  . Irregular periods 12/31/2014  . 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 Hematemesis with nausea     Plan: 1. Patient will undergo surgical management with Hysteroscopy D&C, Endometrial Ablation and Bilateral Tubal Ligation.(fallope rings)  2. Surgery scheduled for 10/08/2017, papers signed 09/02/17 3. F/u 3 weeks for Post-op   By signing my name below, I, Margit Banda, attest that this documentation has been prepared under the direction and in the presence of [name], MD. Electronically Signed: Margit Banda, Medical Scribe. 09/30/17. 10:40 AM.  I personally performed the services described in this documentation, which was SCRIBED in my presence. The recorded information has been reviewed and considered accurate. It has been edited as necessary during review. [name], MD             Electronically signed by [name], MD at 09/30/2017 11:03 AM

## 2017-10-08 NOTE — Op Note (Signed)
Please see the brief operative note for surgical details 

## 2017-10-08 NOTE — Anesthesia Postprocedure Evaluation (Signed)
Anesthesia Post Note  Patient: Valerie Huber  Procedure(s) Performed: DILATATION & CURETTAGE/HYSTEROSCOPY WITH MINERVA  ENDOMETRIAL ABLATION (Procedure #2) (N/A ) BILATERAL TUBAL STERILIZATION WITH FALLOPE RINGS APPLICATION (Procedure #1) (Bilateral )  Patient location during evaluation: PACU Anesthesia Type: General Level of consciousness: awake and alert Pain management: pain level controlled Vital Signs Assessment: post-procedure vital signs reviewed and stable Respiratory status: spontaneous breathing Cardiovascular status: stable Postop Assessment: no apparent nausea or vomiting Anesthetic complications: no Comments: Receiving dilaudid for pain control with improvement noted     Last Vitals:  Vitals:   10/08/17 0945 10/08/17 1000  BP: 114/80 94/67  Pulse: 94 72  Resp: 16 16  Temp:    SpO2: 100% 100%    Last Pain:  Vitals:   10/08/17 1015  TempSrc:   PainSc: 8                  [name]

## 2017-10-08 NOTE — Anesthesia Postprocedure Evaluation (Signed)
Anesthesia Post Note  Patient: Valerie Huber  Procedure(s) Performed: DILATATION & CURETTAGE/HYSTEROSCOPY WITH MINERVA  ENDOMETRIAL ABLATION (Procedure #2) (N/A ) BILATERAL TUBAL STERILIZATION WITH FALLOPE RINGS APPLICATION (Procedure #1) (Bilateral )  Patient location during evaluation: PACU Anesthesia Type: General Level of consciousness: awake and alert and oriented Pain management: pain level controlled Vital Signs Assessment: post-procedure vital signs reviewed and stable Respiratory status: spontaneous breathing Cardiovascular status: stable Anesthetic complications: no     Last Vitals:  Vitals:   10/08/17 0945 10/08/17 1000  BP: 114/80 94/67  Pulse: 94 72  Resp: 16 16  Temp:    SpO2: 100% 100%    Last Pain:  Vitals:   10/08/17 1015  TempSrc:   PainSc: 8                  Norris Bodley A

## 2017-10-08 NOTE — Op Note (Signed)
10/08/2017  9:29 AM  PATIENT:  Valerie Huber  32 y.o. female  PRE-OPERATIVE DIAGNOSIS:  Dysmenorrhea, heavy menses, desire for permanent Sterilization  POST-OPERATIVE DIAGNOSIS:  Dysmenorrhea, heavy menses, desire for permanent Sterilization  PROCEDURE:  Procedure(s): DILATATION & CURETTAGE/HYSTEROSCOPY WITH MINERVA  ENDOMETRIAL ABLATION (N/A) BILATERAL TUBAL STERILIZATION WITH FALLOPE RINGS APPLICATION (Bilateral)  SURGEON:  Surgeon(s) and Role:    [name], MD - Primary  PHYSICIAN ASSISTANT:   ASSISTANTS:    ANESTHESIA:   local, general and paracervical block  EBL:  10 mL   BLOOD ADMINISTERED:none  DRAINS: none   LOCAL MEDICATIONS USED:  MARCAINE   30 cc  SPECIMEN:  Source of Specimen:  Endometrial curetting  DISPOSITION OF SPECIMEN:  PATHOLOGY  COUNTS:  YES  TOURNIQUET:  * No tourniquets in log *  DICTATION: .Dragon Dictation  PLAN OF CARE: Discharge to home after PACU  PATIENT DISPOSITION:  PACU - hemodynamically stable.   Delay start of Pharmacological VTE agent (>24hrs) due to surgical blood loss or risk of bleeding: not applicable Details of procedure: Patient was taken to the operating room prepped and draped for combined abdominal and vaginal procedure with moistened sponge stick placed per vagina for manipulation of the uterus.  Timeout was conducted and procedure confirmed by surgical team.  Infraumbilical vertical 1 cm skin incision was made as well as a transverse suprapubic incision in similar length.  The Veress needle was introduced through the umbilical site being careful to oriented toward the pelvis, and using water droplet technique to confirm free flow into the peritoneum.  Intraperitoneal pneumoperitoneum was then achieved under 5 mm to 8 mm intra-abdominal pressure, and then the 5 mm laparoscopic trocar directly inserted and abdomen visualized with no evidence of trauma and no evidence of pelvic adhesions.  Attention was then directed  to the pelvis with the patient in Trendelenburg position the sponge stick could be manipulated and the uterus found to be quite mobile with no adhesions.  Ovaries were grossly normal.  Fallopian tubes were easily visualized to their fimbriated end.  The Falope ring was applied first the right tube at its midportion with proper positioning confirmed.  Percutaneous injection around the Falope ring with Marcaine with epinephrine was then performed and then the opposite tube treated in a similar fashion.  9 cc of Marcaine was used intra-abdominal. Endometrial ablation: Surgeon repositioned to the vaginal area with speculum inserted cervix easily identified grasped on the anterior lip with single-tooth tenaculum, and paracervical block applied at 2,4,5,7,8 and 10:00 x21 cc total volume.  The uterus was sounded to 9 and half centimeters, dilated in the anteflexed position to 23 [place] allowing introduction of the 30 degree operative hysteroscope with visualization of the tubal ostia and no evidence of perforation or complication.  A smooth sharp curettage was briefly performed and photodocumentation of the smooth cavity performed.  The Minerva ablation device was then prepared inserted activated confirmed as properly positioned and the 122nd ablation sequence completed with 4.5 cm length to the active endometrial cavity ablation.  Patient tolerated procedure well and went to recovery in stable condition.  Toradol was avoided due to her NSAID allergy.  She will be discharged home on oxycodone for acute postop pain.

## 2017-10-08 NOTE — Interval H&P Note (Signed)
History and Physical Interval Note:  10/08/2017 7:33 AM  Valerie Huber  has presented today for surgery, with the diagnosis of Dysmenorrhea Sterilization  The various methods of treatment have been discussed with the patient and family. After consideration of risks, benefits and other options for treatment, the patient has consented to  Procedure(s): DILATATION & CURETTAGE/HYSTEROSCOPY WITH MINERVA (N/A) BILATERAL TUBAL LIGATION WITH FALLOPE RINGS (Bilateral) as a surgical intervention .  The patient's history has been reviewed, patient examined, no change in status, stable for surgery.  I have reviewed the patient's chart and labs.  Questions were answered to the patient's satisfaction.   Her Last menses was the 20th of this month, and she has not been sexually active since that date. Serum HCG is Negative.  [name]

## 2017-10-08 NOTE — Transfer of Care (Signed)
Immediate Anesthesia Transfer of Care Note  Patient: Valerie Huber  Procedure(s) Performed: DILATATION & CURETTAGE/HYSTEROSCOPY WITH MINERVA  ENDOMETRIAL ABLATION (N/A ) BILATERAL TUBAL STERILIZATION WITH FALLOPE RINGS APPLICATION (Bilateral )  Patient Location: PACU  Anesthesia Type:General  Level of Consciousness: awake and patient cooperative  Airway & Oxygen Therapy: Patient Spontanous Breathing  Post-op Assessment: Report given to RN and Post -op Vital signs reviewed and stable  Post vital signs: Reviewed and stable  Last Vitals:  Vitals Value Taken Time  BP 129/95 10/08/2017  9:37 AM  Temp    Pulse 97 10/08/2017  9:39 AM  Resp 13 10/08/2017  9:39 AM  SpO2 98 % 10/08/2017  9:39 AM  Vitals shown include unvalidated device data.  Last Pain:  Vitals:   10/08/17 0743  TempSrc: Oral  PainSc:       Patients Stated Pain Goal: 6 (02/18/11 1975)  Complications: No apparent anesthesia complications

## 2017-10-08 NOTE — Anesthesia Procedure Notes (Signed)
Procedure Name: Intubation Date/Time: 10/08/2017 8:35 AM Performed by: Georgeanne Nim, CRNA Pre-anesthesia Checklist: Patient identified, Emergency Drugs available, Suction available, Patient being monitored and Timeout performed Patient Re-evaluated:Patient Re-evaluated prior to induction Oxygen Delivery Method: Circle system utilized Preoxygenation: Pre-oxygenation with 100% oxygen Induction Type: IV induction Ventilation: Mask ventilation without difficulty Laryngoscope Size: Mac and 4 Grade View: Grade I Tube size: 7.0 mm Number of attempts: 1 Airway Equipment and Method: Stylet Placement Confirmation: ETT inserted through vocal cords under direct vision,  positive ETCO2,  CO2 detector and breath sounds checked- equal and bilateral Secured at: 21 cm Tube secured with: Tape Dental Injury: Teeth and Oropharynx as per pre-operative assessment

## 2017-10-08 NOTE — Anesthesia Preprocedure Evaluation (Addendum)
Anesthesia Evaluation  Patient identified by MRN, date of birth, ID band Patient awake    Reviewed: Allergy & Precautions, NPO status , Patient's Chart, lab work & pertinent test results  Airway Mallampati: I  TM Distance: >3 FB Neck ROM: Full    Dental no notable dental hx.    Pulmonary neg pulmonary ROS, former smoker,  Denied any pulm issues or med - ex smoker    Pulmonary exam normal breath sounds clear to auscultation       Cardiovascular Exercise Tolerance: Good negative cardio ROS Normal cardiovascular examI Rhythm:Regular Rate:Normal     Neuro/Psych States one Sz in past with high fever -approx 3 years ago- denies ever having meds for Sz negative neurological ROS  negative psych ROS   GI/Hepatic negative GI ROS, Neg liver ROS,   Endo/Other  negative endocrine ROS  Renal/GU negative Renal ROS  negative genitourinary   Musculoskeletal negative musculoskeletal ROS (+)   Abdominal   Peds negative pediatric ROS (+)  Hematology negative hematology ROS (+)   Anesthesia Other Findings   Reproductive/Obstetrics negative OB ROS                           Anesthesia Physical Anesthesia Plan  ASA: II  Anesthesia Plan: General   Post-op Pain Management:    Induction: Intravenous  PONV Risk Score and Plan:   Airway Management Planned:   Additional Equipment:   Intra-op Plan:   Post-operative Plan: Extubation in OR  Informed Consent: I have reviewed the patients History and Physical, chart, labs and discussed the procedure including the risks, benefits and alternatives for the proposed anesthesia with the patient or authorized representative who has indicated his/her understanding and acceptance.     Plan Discussed with: CRNA  Anesthesia Plan Comments:         Anesthesia Quick Evaluation

## 2017-10-09 ENCOUNTER — Encounter (HOSPITAL_COMMUNITY): Payer: Self-pay | Admitting: Obstetrics and Gynecology

## 2017-10-23 ENCOUNTER — Ambulatory Visit (INDEPENDENT_AMBULATORY_CARE_PROVIDER_SITE_OTHER): Payer: Medicaid Other | Admitting: Obstetrics and Gynecology

## 2017-10-23 ENCOUNTER — Encounter: Payer: Self-pay | Admitting: Obstetrics and Gynecology

## 2017-10-23 VITALS — BP 105/71 | HR 82 | Ht 63.0 in | Wt 156.4 lb

## 2017-10-23 DIAGNOSIS — R5082 Postprocedural fever: Secondary | ICD-10-CM

## 2017-10-23 MED ORDER — DOXYCYCLINE HYCLATE 100 MG PO CAPS: 100.0000 mg | ORAL_CAPSULE | Freq: Two times a day (BID) | ORAL | 0 refills | Status: DC

## 2017-10-23 MED ORDER — METRONIDAZOLE 500 MG PO TABS: 500.0000 mg | ORAL_TABLET | Freq: Two times a day (BID) | ORAL | 0 refills | Status: DC

## 2017-10-23 MED ORDER — OXYCODONE HCL 5 MG PO TABS
5.0000 mg | ORAL_TABLET | ORAL | 0 refills | Status: DC | PRN
Start: 2017-10-23 — End: 2017-10-28

## 2017-10-23 NOTE — Progress Notes (Addendum)
Patient ID: Valerie Huber, female   DOB: 07/14/1985, 32 y.o.   MRN: [id number]     Subjective:  Valerie Huber is a 32 y.o. female now 2 weeks status post [place] (Procedure #1),, DILATATION & CURETTAGE/HYSTEROSCOPY WITH MINERVA  ENDOMETRIAL ABLATION (Procedure #2).  Is hurting in her stomach. She says she was hurting fine then 5 days after her surgery. She says her pain was an acute onset and is taking 4 or 5 tylenol every 5 hours to relieve symptoms. She notes that she has to take 3 showers a day due to the odor of " something dead inside of her". She has a fever of 101.9 yesterday and took tylenol. She still has to wear pads given to her after the surgery.    Review of Systems Negative except    Diet:   normal   Bowel movements : normal.  Pain is controlled with current analgesics. Medications being used: doxycycline and hydrocodone.  Objective:  BP 105/71 (BP Location: Left Arm, Patient Position: Sitting, Cuff Size: Normal)   Pulse 82   Ht 5\' 3"  (1.6 m)   Wt 156 lb 6.4 oz (70.9 kg)   LMP 09/30/2017   BMI 27.71 kg/m  General:Well developed, well nourished.  No acute distress. Abdomen: Bowel sounds normal, soft, non-tender. Pelvic Exam:    External Genitalia:  Normal.    Vagina: Normal    Cervix: Normal    Uterus: retroverted, mildly tender  Incision(s):   Healing well, no drainage, no erythema, no hernia, no swelling, no dehiscence,     Assessment:  Post-Op 2 weeks s/p BILATERAL TUBAL STERILIZATION WITH FALLOPE RINGS APPLICATION (Procedure #1),, DILATATION & CURETTAGE/HYSTEROSCOPY WITH MINERVA  ENDOMETRIAL ABLATION (Procedure #2)   2. Inflamed uterus Plan:  1.Wound care discussed  2. Rx doxycycline and hydrocodone  3. Labs CBC and sedimentation rate 4. Activity restrictions: No work for 1 week 5. return to work: 1 week. 6. Follow up in 10/28/2017  By signing my name below, I, [name], attest that this  documentation has been prepared under the direction and in the presence of [name], MD. Electronically Signed: [place]. 10/23/17. 11:56 AM.  I personally performed the services described in this documentation, which was SCRIBED in my presence. The recorded information has been reviewed and considered accurate. It has been edited as necessary during review. [name], MD

## 2017-10-25 LAB — CBC WITH DIFFERENTIAL/PLATELET
BASOS ABS: 0.2 10*3/uL (ref 0.0–0.2)
Basos: 2 %
EOS (ABSOLUTE): 0.2 10*3/uL (ref 0.0–0.4)
Eos: 3 %
HEMATOCRIT: 38.2 % (ref 34.0–46.6)
HEMOGLOBIN: 12.7 g/dL (ref 11.1–15.9)
Immature Grans (Abs): 0 10*3/uL (ref 0.0–0.1)
Immature Granulocytes: 1 %
LYMPHS ABS: 3.2 10*3/uL — AB (ref 0.7–3.1)
Lymphs: 46 %
MCH: 31.2 pg (ref 26.6–33.0)
MCHC: 33.2 g/dL (ref 31.5–35.7)
MCV: 94 fL (ref 79–97)
MONOS ABS: 0.8 10*3/uL (ref 0.1–0.9)
Monocytes: 12 %
NEUTROS ABS: 2.5 10*3/uL (ref 1.4–7.0)
NRBC: 2 % — ABNORMAL HIGH (ref 0–0)
Neutrophils: 36 %
Platelets: 282 10*3/uL (ref 150–450)
RBC: 4.07 x10E6/uL (ref 3.77–5.28)
RDW: 13.2 % (ref 12.3–15.4)
WBC: 6.9 10*3/uL (ref 3.4–10.8)

## 2017-10-25 LAB — SEDIMENTATION RATE: Sed Rate: 15 mm/hr (ref 0–32)

## 2017-10-28 ENCOUNTER — Telehealth: Payer: Self-pay | Admitting: Obstetrics and Gynecology

## 2017-10-28 ENCOUNTER — Encounter: Payer: Self-pay | Admitting: *Deleted

## 2017-10-28 ENCOUNTER — Other Ambulatory Visit (HOSPITAL_COMMUNITY)
Admission: RE | Admit: 2017-10-28 | Discharge: 2017-10-28 | Disposition: A | Discharge status: Home / Self Care | Payer: Medicaid Other | Source: Ambulatory Visit | Attending: Obstetrics and Gynecology | Admitting: Obstetrics and Gynecology

## 2017-10-28 ENCOUNTER — Encounter: Payer: Self-pay | Admitting: Obstetrics and Gynecology

## 2017-10-28 ENCOUNTER — Ambulatory Visit (INDEPENDENT_AMBULATORY_CARE_PROVIDER_SITE_OTHER): Payer: Medicaid Other | Admitting: Obstetrics and Gynecology

## 2017-10-28 VITALS — BP 111/72 | HR 74 | Ht 64.0 in | Wt 159.8 lb

## 2017-10-28 DIAGNOSIS — G8918 Other acute postprocedural pain: Secondary | ICD-10-CM | POA: Diagnosis not present

## 2017-10-28 DIAGNOSIS — R197 Diarrhea, unspecified: Secondary | ICD-10-CM | POA: Diagnosis not present

## 2017-10-28 DIAGNOSIS — Z124 Encounter for screening for malignant neoplasm of cervix: Secondary | ICD-10-CM | POA: Diagnosis not present

## 2017-10-28 DIAGNOSIS — R112 Nausea with vomiting, unspecified: Secondary | ICD-10-CM

## 2017-10-28 MED ORDER — OXYCODONE HCL 5 MG PO TABS: 5.0000 mg | ORAL_TABLET | ORAL | 0 refills | Status: AC | PRN

## 2017-10-28 NOTE — Progress Notes (Addendum)
Patient ID: Valerie Huber, female   DOB: 14-Apr-1986, 32 y.o.   MRN: [id number]   Subjective:  Valerie Huber is a 32 y.o. female now 3 weeks status post [place].  She notes that for there first few days after surgery she was fine but she felt "contraction pains" that she dropped mop bucket at work. The pain are intermittent. Now she has diarrhea At 4-5 am this morning she has had diarrhea, vomiting and discharge. She believes something is going on in her whole abdomen. Review of Systems Negative except diarrhea, vomiting and slight vaginal discharge   Diet:   reduced   Bowel movements : loose.  Pain is controlled with current analgesics. Medications being used: narcotic analgesics including percocet.  Objective:  BP 111/72 (BP Location: Right Arm, Patient Position: Sitting, Cuff Size: Small)   Pulse 74   Ht '5\' 4"'  (1.626 m)   Wt 159 lb 12.8 oz (72.5 kg)   LMP 09/30/2017   BMI 27.43 kg/m  General:Well developed, well nourished.  Agitated. Abdomen: Active watery bowel sounds throughout entire abdomen  soft, non-tender. Pelvic Exam:    External Genitalia:  Normal.    Vagina: Normal    Cervix: Normal    Uterus: moderate tenderness to contact in the marked retroverted position.    Adnexa/Bimanual: Normal    PAP: Pap smear done today.   Incision(s):   Healing well healed, no drainage, no erythema, no hernia, no swelling, no dehiscence,     Assessment:  Post-Op 3 weeks s/p BILATERAL TUBAL STERILIZATION WITH FALLOPE RINGS APPLICATION, DILATATION & CURETTAGE/HYSTEROSCOPY WITH MINERVA  ENDOMETRIAL ABLATION   Not doing well now after 5 day initial wellness postoperatively.  Marked uterine retroversion Nausea and vomiting. Plan:  1.CT abdomen and pelvis 2. Current medications.renew percocet, will not rx antibiotics any more 3. Activity restrictions: until  further notice 4. return to work: until further notice. 5. Follow up in 1 week. 6. CBC and ESR 7 pap done 8 refil Oxycodone By signing my name below, I, [name], attest that this documentation has been prepared under the direction and in the presence of [name], MD. Electronically Signed: [place]. 10/28/17. 12:36 PM.  I personally performed the services described in this documentation, which was SCRIBED in my presence. The recorded information has been reviewed and considered accurate. It has been edited as necessary during review. [name], MD

## 2017-10-28 NOTE — Telephone Encounter (Signed)
Pt requests refil of oxycodone due to the pelvic pain.

## 2017-10-28 NOTE — Addendum Note (Signed)
Addended by: [name] on: 10/28/2017 04:15 PM   Modules accepted: Orders

## 2017-10-29 LAB — BASIC METABOLIC PANEL
BUN / CREAT RATIO: 14 (ref 9–23)
BUN: 9 mg/dL (ref 6–20)
CO2: 23 mmol/L (ref 20–29)
CREATININE: 0.64 mg/dL (ref 0.57–1.00)
Calcium: 9.5 mg/dL (ref 8.7–10.2)
Chloride: 103 mmol/L (ref 96–106)
GFR calc non Af Amer: 117 mL/min/{1.73_m2} (ref >59)
GFR, EST AFRICAN AMERICAN: 135 mL/min/{1.73_m2} (ref >59)
Glucose: 81 mg/dL (ref 65–99)
Potassium: 4.4 mmol/L (ref 3.5–5.2)
SODIUM: 142 mmol/L (ref 134–144)

## 2017-10-29 LAB — CBC WITH DIFFERENTIAL/PLATELET
BASOS: 0 %
Basophils Absolute: 0 10*3/uL (ref 0.0–0.2)
EOS (ABSOLUTE): 0.1 10*3/uL (ref 0.0–0.4)
EOS: 1 %
HEMATOCRIT: 37.9 % (ref 34.0–46.6)
Hemoglobin: 11.9 g/dL (ref 11.1–15.9)
Immature Grans (Abs): 0 10*3/uL (ref 0.0–0.1)
Immature Granulocytes: 0 %
LYMPHS ABS: 2.9 10*3/uL (ref 0.7–3.1)
Lymphs: 57 %
MCH: 30.3 pg (ref 26.6–33.0)
MCHC: 31.4 g/dL — AB (ref 31.5–35.7)
MCV: 96 fL (ref 79–97)
MONOS ABS: 0.5 10*3/uL (ref 0.1–0.9)
Monocytes: 9 %
Neutrophils Absolute: 1.7 10*3/uL (ref 1.4–7.0)
Neutrophils: 33 %
PLATELETS: 283 10*3/uL (ref 150–450)
RBC: 3.93 x10E6/uL (ref 3.77–5.28)
RDW: 13 % (ref 12.3–15.4)
WBC: 5.2 10*3/uL (ref 3.4–10.8)

## 2017-10-29 LAB — SEDIMENTATION RATE: Sed Rate: 18 mm/hr (ref 0–32)

## 2017-10-31 LAB — CYTOLOGY - PAP
CHLAMYDIA, DNA PROBE: NEGATIVE
DIAGNOSIS: NEGATIVE
HPV: NOT DETECTED
NEISSERIA GONORRHEA: NEGATIVE

## 2017-11-15 ENCOUNTER — Ambulatory Visit (HOSPITAL_COMMUNITY): Admission: RE | Admit: 2017-11-15 | Payer: Medicaid Other | Source: Ambulatory Visit

## 2017-11-26 ENCOUNTER — Other Ambulatory Visit: Payer: Self-pay

## 2017-11-26 ENCOUNTER — Emergency Department (HOSPITAL_COMMUNITY): Payer: Medicaid Other

## 2017-11-26 ENCOUNTER — Emergency Department (HOSPITAL_COMMUNITY)
Admission: EM | Admit: 2017-11-26 | Discharge: 2017-11-26 | Disposition: A | Discharge status: Home / Self Care | Payer: Medicaid Other | Attending: Emergency Medicine | Admitting: Emergency Medicine

## 2017-11-26 ENCOUNTER — Encounter (HOSPITAL_COMMUNITY): Payer: Self-pay | Admitting: *Deleted

## 2017-11-26 DIAGNOSIS — M546 Pain in thoracic spine: Secondary | ICD-10-CM | POA: Insufficient documentation

## 2017-11-26 DIAGNOSIS — R202 Paresthesia of skin: Secondary | ICD-10-CM | POA: Insufficient documentation

## 2017-11-26 DIAGNOSIS — Z87891 Personal history of nicotine dependence: Secondary | ICD-10-CM | POA: Diagnosis not present

## 2017-11-26 DIAGNOSIS — Y92008 Other place in unspecified non-institutional (private) residence as the place of occurrence of the external cause: Secondary | ICD-10-CM | POA: Diagnosis not present

## 2017-11-26 DIAGNOSIS — Y939 Activity, unspecified: Secondary | ICD-10-CM | POA: Insufficient documentation

## 2017-11-26 DIAGNOSIS — S3992XA Unspecified injury of lower back, initial encounter: Secondary | ICD-10-CM | POA: Diagnosis not present

## 2017-11-26 DIAGNOSIS — W109XXA Fall (on) (from) unspecified stairs and steps, initial encounter: Secondary | ICD-10-CM | POA: Insufficient documentation

## 2017-11-26 DIAGNOSIS — S199XXA Unspecified injury of neck, initial encounter: Secondary | ICD-10-CM | POA: Diagnosis not present

## 2017-11-26 DIAGNOSIS — W19XXXA Unspecified fall, initial encounter: Secondary | ICD-10-CM

## 2017-11-26 DIAGNOSIS — J45909 Unspecified asthma, uncomplicated: Secondary | ICD-10-CM | POA: Diagnosis not present

## 2017-11-26 DIAGNOSIS — S29012A Strain of muscle and tendon of back wall of thorax, initial encounter: Secondary | ICD-10-CM | POA: Insufficient documentation

## 2017-11-26 DIAGNOSIS — S299XXA Unspecified injury of thorax, initial encounter: Secondary | ICD-10-CM | POA: Diagnosis not present

## 2017-11-26 DIAGNOSIS — R4020 Unspecified coma: Secondary | ICD-10-CM | POA: Diagnosis not present

## 2017-11-26 DIAGNOSIS — S0990XA Unspecified injury of head, initial encounter: Secondary | ICD-10-CM | POA: Diagnosis present

## 2017-11-26 DIAGNOSIS — Y999 Unspecified external cause status: Secondary | ICD-10-CM | POA: Insufficient documentation

## 2017-11-26 DIAGNOSIS — Z79899 Other long term (current) drug therapy: Secondary | ICD-10-CM | POA: Diagnosis not present

## 2017-11-26 DIAGNOSIS — S39012A Strain of muscle, fascia and tendon of lower back, initial encounter: Secondary | ICD-10-CM | POA: Diagnosis not present

## 2017-11-26 MED ORDER — FENTANYL CITRATE (PF) 100 MCG/2ML IJ SOLN
100.0000 ug | Freq: Once | INTRAMUSCULAR | Status: AC
  Administered 2017-11-26: 100 ug via INTRAVENOUS
  Filled 2017-11-26: qty 2

## 2017-11-26 MED ORDER — METHOCARBAMOL 500 MG PO TABS: 500.0000 mg | ORAL_TABLET | Freq: Three times a day (TID) | ORAL | 0 refills | Status: DC | PRN

## 2017-11-26 MED ORDER — OXYCODONE-ACETAMINOPHEN 5-325 MG PO TABS: 1.0000 | ORAL_TABLET | Freq: Three times a day (TID) | ORAL | 0 refills | Status: DC | PRN
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
## 2017-12-03 ENCOUNTER — Telehealth: Payer: Self-pay | Admitting: Orthopedic Surgery

## 2017-12-03 NOTE — Telephone Encounter (Signed)
Patient called to request her records from our office, I explained to her that we have a third party that does our records and she would be here tomorrow. I also told the patient that she would need to come by our office and sign a release for her records, and out of courtesy we could do her last office note and give her MRI report to her, but all the other notes would be mailed to her by our third party person. She hesitated and stated she would come by.

## 2018-02-20 ENCOUNTER — Ambulatory Visit (INDEPENDENT_AMBULATORY_CARE_PROVIDER_SITE_OTHER): Payer: Medicaid Other | Admitting: Obstetrics and Gynecology

## 2018-02-20 ENCOUNTER — Emergency Department (HOSPITAL_COMMUNITY): Payer: Medicaid Other

## 2018-02-20 ENCOUNTER — Inpatient Hospital Stay (HOSPITAL_COMMUNITY)
Admission: EM | Admit: 2018-02-20 | Discharge: 2018-02-22 | DRG: 392 | Disposition: A | Discharge status: Home / Self Care | Payer: Medicaid Other | Source: Ambulatory Visit | Attending: Obstetrics and Gynecology | Admitting: Obstetrics and Gynecology

## 2018-02-20 ENCOUNTER — Encounter (HOSPITAL_COMMUNITY): Payer: Self-pay | Admitting: Emergency Medicine

## 2018-02-20 ENCOUNTER — Other Ambulatory Visit: Payer: Self-pay

## 2018-02-20 ENCOUNTER — Encounter: Payer: Self-pay | Admitting: Obstetrics and Gynecology

## 2018-02-20 VITALS — BP 153/100 | HR 103 | Temp 100.7°F | Ht 64.0 in | Wt 158.4 lb

## 2018-02-20 DIAGNOSIS — R109 Unspecified abdominal pain: Secondary | ICD-10-CM

## 2018-02-20 DIAGNOSIS — R1032 Left lower quadrant pain: Secondary | ICD-10-CM

## 2018-02-20 DIAGNOSIS — R197 Diarrhea, unspecified: Secondary | ICD-10-CM

## 2018-02-20 DIAGNOSIS — R1110 Vomiting, unspecified: Secondary | ICD-10-CM | POA: Diagnosis not present

## 2018-02-20 DIAGNOSIS — Z825 Family history of asthma and other chronic lower respiratory diseases: Secondary | ICD-10-CM

## 2018-02-20 DIAGNOSIS — R509 Fever, unspecified: Secondary | ICD-10-CM

## 2018-02-20 DIAGNOSIS — R52 Pain, unspecified: Secondary | ICD-10-CM

## 2018-02-20 DIAGNOSIS — Z8249 Family history of ischemic heart disease and other diseases of the circulatory system: Secondary | ICD-10-CM | POA: Diagnosis not present

## 2018-02-20 DIAGNOSIS — R102 Pelvic and perineal pain: Secondary | ICD-10-CM | POA: Diagnosis not present

## 2018-02-20 DIAGNOSIS — Z113 Encounter for screening for infections with a predominantly sexual mode of transmission: Secondary | ICD-10-CM | POA: Diagnosis not present

## 2018-02-20 DIAGNOSIS — Z8051 Family history of malignant neoplasm of kidney: Secondary | ICD-10-CM

## 2018-02-20 DIAGNOSIS — A09 Infectious gastroenteritis and colitis, unspecified: Principal | ICD-10-CM | POA: Diagnosis present

## 2018-02-20 DIAGNOSIS — E876 Hypokalemia: Secondary | ICD-10-CM | POA: Diagnosis present

## 2018-02-20 DIAGNOSIS — Z87891 Personal history of nicotine dependence: Secondary | ICD-10-CM

## 2018-02-20 DIAGNOSIS — R1031 Right lower quadrant pain: Secondary | ICD-10-CM | POA: Diagnosis present

## 2018-02-20 DIAGNOSIS — R1030 Lower abdominal pain, unspecified: Secondary | ICD-10-CM | POA: Diagnosis not present

## 2018-02-20 DIAGNOSIS — K625 Hemorrhage of anus and rectum: Secondary | ICD-10-CM | POA: Diagnosis not present

## 2018-02-20 DIAGNOSIS — R630 Anorexia: Secondary | ICD-10-CM

## 2018-02-20 DIAGNOSIS — N838 Other noninflammatory disorders of ovary, fallopian tube and broad ligament: Secondary | ICD-10-CM | POA: Diagnosis not present

## 2018-02-20 LAB — DIFFERENTIAL
ABS IMMATURE GRANULOCYTES: 0.01 10*3/uL (ref 0.00–0.07)
Basophils Absolute: 0 10*3/uL (ref 0.0–0.1)
Basophils Relative: 0 %
EOS ABS: 0 10*3/uL (ref 0.0–0.5)
EOS PCT: 1 %
IMMATURE GRANULOCYTES: 0 %
LYMPHS ABS: 3.5 10*3/uL (ref 0.7–4.0)
LYMPHS PCT: 58 %
Monocytes Absolute: 0.6 10*3/uL (ref 0.1–1.0)
Monocytes Relative: 10 %
Neutro Abs: 1.9 10*3/uL (ref 1.7–7.7)
Neutrophils Relative %: 31 %

## 2018-02-20 LAB — CBC
HEMATOCRIT: 38.2 % (ref 36.0–46.0)
Hemoglobin: 12.3 g/dL (ref 12.0–15.0)
MCH: 31.1 pg (ref 26.0–34.0)
MCHC: 32.2 g/dL (ref 30.0–36.0)
MCV: 96.5 fL (ref 80.0–100.0)
NRBC: 0 % (ref 0.0–0.2)
PLATELETS: 302 10*3/uL (ref 150–400)
RBC: 3.96 MIL/uL (ref 3.87–5.11)
RDW: 12.3 % (ref 11.5–15.5)
WBC: 6 10*3/uL (ref 4.0–10.5)

## 2018-02-20 LAB — COMPREHENSIVE METABOLIC PANEL
ALT: 16 U/L (ref 0–44)
AST: 17 U/L (ref 15–41)
Albumin: 4.7 g/dL (ref 3.5–5.0)
Alkaline Phosphatase: 51 U/L (ref 38–126)
Anion gap: 10 (ref 5–15)
BILIRUBIN TOTAL: 0.8 mg/dL (ref 0.3–1.2)
BUN: 11 mg/dL (ref 6–20)
CALCIUM: 9.4 mg/dL (ref 8.9–10.3)
CHLORIDE: 109 mmol/L (ref 98–111)
CO2: 20 mmol/L — ABNORMAL LOW (ref 22–32)
CREATININE: 0.68 mg/dL (ref 0.44–1.00)
Glucose, Bld: 91 mg/dL (ref 70–99)
Potassium: 3.4 mmol/L — ABNORMAL LOW (ref 3.5–5.1)
Sodium: 139 mmol/L (ref 135–145)
TOTAL PROTEIN: 8.6 g/dL — AB (ref 6.5–8.1)

## 2018-02-20 LAB — RAPID URINE DRUG SCREEN, HOSP PERFORMED
Amphetamines: NOT DETECTED
BARBITURATES: NOT DETECTED
Benzodiazepines: NOT DETECTED
COCAINE: NOT DETECTED
OPIATES: POSITIVE — AB
TETRAHYDROCANNABINOL: POSITIVE — AB

## 2018-02-20 LAB — URINALYSIS, ROUTINE W REFLEX MICROSCOPIC
Bacteria, UA: NONE SEEN
Glucose, UA: NEGATIVE mg/dL
Ketones, ur: 5 mg/dL — AB
LEUKOCYTES UA: NEGATIVE
NITRITE: NEGATIVE
PH: 5 (ref 5.0–8.0)
Protein, ur: NEGATIVE mg/dL
SPECIFIC GRAVITY, URINE: 1.039 — AB (ref 1.005–1.030)

## 2018-02-20 LAB — SEDIMENTATION RATE: Sed Rate: 22 mm/hr (ref 0–22)

## 2018-02-20 LAB — LIPASE, BLOOD: LIPASE: 26 U/L (ref 11–51)

## 2018-02-20 LAB — I-STAT BETA HCG BLOOD, ED (MC, WL, AP ONLY): I-stat hCG, quantitative: <5 m[IU]/mL (ref <5)

## 2018-02-20 MED ORDER — IOPAMIDOL (ISOVUE-300) INJECTION 61%
100.0000 mL | Freq: Once | INTRAVENOUS | Status: AC | PRN
  Administered 2018-02-20: 100 mL via INTRAVENOUS

## 2018-02-20 MED ORDER — PROMETHAZINE HCL 25 MG/ML IJ SOLN
25.0000 mg | Freq: Once | INTRAMUSCULAR | Status: AC
  Administered 2018-02-20: 25 mg via INTRAVENOUS
  Filled 2018-02-20: qty 1

## 2018-02-20 MED ORDER — MORPHINE SULFATE (PF) 4 MG/ML IV SOLN
4.0000 mg | Freq: Once | INTRAVENOUS | Status: AC
  Administered 2018-02-20: 4 mg via INTRAVENOUS
  Filled 2018-02-20: qty 1

## 2018-02-20 MED ORDER — HYDROMORPHONE HCL 1 MG/ML IJ SOLN
1.0000 mg | Freq: Once | INTRAMUSCULAR | Status: AC
  Administered 2018-02-20: 1 mg via INTRAVENOUS
  Filled 2018-02-20: qty 1

## 2018-02-20 MED ORDER — ZOLPIDEM TARTRATE 5 MG PO TABS
5.0000 mg | ORAL_TABLET | Freq: Every evening | ORAL | Status: DC | PRN
  Administered 2018-02-20: 5 mg via ORAL
  Filled 2018-02-20: qty 1

## 2018-02-20 MED ORDER — SODIUM CHLORIDE 0.9 % IV BOLUS
1000.0000 mL | Freq: Once | INTRAVENOUS | Status: AC
  Administered 2018-02-20: 1000 mL via INTRAVENOUS

## 2018-02-20 MED ORDER — ONDANSETRON HCL 4 MG/2ML IJ SOLN
4.0000 mg | Freq: Four times a day (QID) | INTRAMUSCULAR | Status: DC | PRN
  Administered 2018-02-21: 4 mg via INTRAVENOUS
  Filled 2018-02-20: qty 2

## 2018-02-20 MED ORDER — ONDANSETRON HCL 4 MG/2ML IJ SOLN: 4.0000 mg | Freq: Three times a day (TID) | INTRAMUSCULAR | Status: DC | PRN

## 2018-02-20 MED ORDER — ENOXAPARIN SODIUM 40 MG/0.4ML ~~LOC~~ SOLN
40.0000 mg | SUBCUTANEOUS | Status: DC
  Administered 2018-02-21: 40 mg via SUBCUTANEOUS
  Filled 2018-02-20 (×2): qty 0.4

## 2018-02-20 MED ORDER — ONDANSETRON HCL 4 MG PO TABS: 4.0000 mg | ORAL_TABLET | Freq: Four times a day (QID) | ORAL | Status: DC | PRN

## 2018-02-20 MED ORDER — HYDROMORPHONE HCL 1 MG/ML IJ SOLN: 1.0000 mg | INTRAMUSCULAR | Status: DC | PRN

## 2018-02-20 MED ORDER — KCL IN DEXTROSE-NACL 20-5-0.9 MEQ/L-%-% IV SOLN
INTRAVENOUS | Status: DC
  Administered 2018-02-20 – 2018-02-22 (×4): via INTRAVENOUS

## 2018-02-20 MED ORDER — ACETAMINOPHEN 325 MG PO TABS: 650.0000 mg | ORAL_TABLET | ORAL | Status: DC | PRN

## 2018-02-20 MED ORDER — PRENATAL MULTIVITAMIN CH
1.0000 | ORAL_TABLET | Freq: Every day | ORAL | Status: DC
  Filled 2018-02-20 (×4): qty 1

## 2018-02-20 MED ORDER — ONDANSETRON HCL 4 MG/2ML IJ SOLN: 4.0000 mg | Freq: Once | INTRAMUSCULAR | Status: DC

## 2018-02-20 MED ORDER — HYDROMORPHONE HCL 1 MG/ML IJ SOLN
1.0000 mg | INTRAMUSCULAR | Status: DC | PRN
  Administered 2018-02-20 – 2018-02-21 (×3): 1 mg via INTRAVENOUS
  Filled 2018-02-20 (×3): qty 1

## 2018-02-20 MED ORDER — ONDANSETRON HCL 4 MG/2ML IJ SOLN
4.0000 mg | Freq: Once | INTRAMUSCULAR | Status: AC
  Administered 2018-02-20: 4 mg via INTRAVENOUS
  Filled 2018-02-20: qty 2

## 2018-02-20 NOTE — ED Provider Notes (Signed)
[facility] EMERGENCY DEPARTMENT Provider Note   CSN: [id number] Arrival date & time: 02/20/18  1446     History   Chief Complaint Chief Complaint  Patient presents with  . Abdominal Pain    HPI Valerie Huber is a 32 y.o. female.  HPI Patient presents with lower abdominal pain.  She is writhing in bed and unable to contribute to history.  Boyfriend is at bedside.  States the patient has been having lower abdominal pain for the past 3 days.  Worsened over the last few hours.  Has had associated nausea, vomiting and diarrhea.  Was seen by Dr. Glo Herring earlier today.  Performed evaluation and referred to the emergency department for further work-up. Past Medical History:  Diagnosis Date  . Asthma   . Endometriosis   . Irregular periods 12/31/2014  . Pelvic pain in female 12/31/2014  . Pregnant 04/14/2015  . Second degree uterine prolapse 12/31/2014  . Seizures ([place])   . Vaginal Pap smear, abnormal     Patient Active Problem List   Diagnosis Date Noted  . Pelvic pain 02/20/2018  . Encounter for counseling regarding contraception 09/02/2017  . Complex regional pain syndrome type 1 of right lower extremity 05/20/2017  . 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 Active labor at term 12/02/2015  . Active labor 12/01/2015  . Decreased appetite 11/16/2015  . Insufficient prenatal care 10/25/2015  . Marijuana use 07/05/2015  . Moderate dysplasia of cervix (CIN II)  at endocervical margin of LEEP specimen 01/22/2015  . Pelvic pain in female 12/31/2014  . Endometriosis determined by laparoscopy 12/31/2014  . Second degree uterine prolapse 12/31/2014  . Irregular periods 12/31/2014  . 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 Hematemesis with nausea     Past Surgical History:  Procedure Laterality Date  . DILITATION & CURRETTAGE/HYSTROSCOPY WITH NOVASURE ABLATION N/A 10/08/2017   Procedure: DILATATION & CURETTAGE/HYSTEROSCOPY WITH MINERVA  ENDOMETRIAL ABLATION (Procedure  #2);  Surgeon: [name], MD;  Location: [facility];  Service: Gynecology;  Laterality: N/A;  . ESOPHAGOGASTRODUODENOSCOPY N/A 12/10/2014   SLF: hematoemesis due to moderate erosive gastritis, duoedentitis, and duodenal ulcer  . LEEP    . surgery for endometriosis  2005  . TUBAL LIGATION Bilateral 10/08/2017   Procedure: BILATERAL TUBAL STERILIZATION WITH FALLOPE RINGS APPLICATION (Procedure #1);  Surgeon: [name], MD;  Location: [facility];  Service: Gynecology;  Laterality: Bilateral;     OB History    Gravida  3   Para  2   Term  2   Preterm      AB  1   Living  2     SAB      TAB  1   Ectopic      Multiple  0   Live Births  2            Home Medications    Prior to Admission medications   Medication Sig Start Date End Date Taking? Authorizing Provider  acetaminophen (TYLENOL) 500 MG tablet Take 500-2,000 mg by mouth every 6 (six) hours as needed.    Yes [provider]  EPINEPHrine (EPI-PEN) 0.3 mg/0.3 mL SOAJ injection Inject 0.3 mg into the muscle daily as needed (for anaphylactic reactions). Reported on 11/23/2015   Yes [provider]  methocarbamol (ROBAXIN) 500 MG tablet Take 1 tablet (500 mg total) by mouth every 8 (eight) hours as needed for muscle spasms. Patient not taking: Reported on 02/20/2018 11/26/17   Davonna Belling, MD  oxyCODONE-acetaminophen (PERCOCET/ROXICET) 5-325 MG tablet Take 1-2 tablets by mouth every 8 (eight) hours as needed for severe pain. Patient not taking: Reported on 02/20/2018 11/26/17   Davonna Belling, MD    Family History Family History  Problem Relation Age of Onset  . Cancer Mother        kidney   . Asthma Mother   . Hypertension Mother   . Cancer Paternal Grandmother   . Colon cancer Neg Hx     Social History Social History   Tobacco Use  . Smoking status: Former Smoker    Packs/day: 0.25    Years: 14.00    Pack years: 3.50    Types: Cigarettes  . Smokeless tobacco: Never Used    . Tobacco comment: QUIT SMOKING ON JULY 11th. STATES WOULD ONLY HAVE A FEW CIGARETTES A WEEK PRIOR TO THIS.   Substance Use Topics  . Alcohol use: No    Alcohol/week: 0.0 standard drinks  . Drug use: No     Allergies   Ibuprofen and Flexeril [cyclobenzaprine]   Review of Systems Review of Systems  Unable to perform ROS: Acuity of condition     Physical Exam Updated Vital Signs BP (!) 133/96 (BP Location: Right Arm)   Pulse (!) 119   Temp 98.2 F (36.8 C) (Oral)   Resp 16   LMP 10/21/2017   SpO2 97%   Physical Exam  Constitutional: She is oriented to person, place, and time. She appears well-developed and well-nourished. She appears distressed.  Writhing around in bed, crying.  HENT:  Head: Normocephalic and atraumatic.  Mouth/Throat: Oropharynx is clear and moist. No oropharyngeal exudate.  Eyes: Pupils are equal, round, and reactive to light. EOM are normal.  Neck: Normal range of motion. Neck supple.  Cardiovascular: Regular rhythm.  Tachycardia.  Pulmonary/Chest: Effort normal and breath sounds normal. No respiratory distress. She has no wheezes. She has no rales.  Abdominal: Soft. Bowel sounds are normal. There is tenderness.  Generalized tenderness to palpation but appears most pronounced in the right lower quadrants and suprapubic regions.  No definite rebound or guarding.  Musculoskeletal: Normal range of motion. She exhibits no edema or tenderness.  No CVA tenderness bilaterally.  Distal pulses intact.  Neurological: She is alert and oriented to person, place, and time.  Moving all extremities without focal deficit.  Sensation intact.  Skin: Skin is warm and dry. No rash noted. No erythema.  Nursing note and vitals reviewed.    ED Treatments / Results  Labs (all labs ordered are listed, but only abnormal results are displayed) Labs Reviewed  COMPREHENSIVE METABOLIC PANEL - Abnormal; Notable for the following components:      Result Value   Potassium  3.4 (*)    CO2 20 (*)    Total Protein 8.6 (*)    All other components within normal limits  URINALYSIS, ROUTINE W REFLEX MICROSCOPIC - Abnormal; Notable for the following components:   Specific Gravity, Urine 1.039 (*)    Hgb urine dipstick SMALL (*)    Bilirubin Urine SMALL (*)    Ketones, ur 5 (*)    All other components within normal limits  RAPID URINE DRUG SCREEN, HOSP PERFORMED - Abnormal; Notable for the following components:   Opiates POSITIVE (*)    Tetrahydrocannabinol POSITIVE (*)    All other components within normal limits  LIPASE, BLOOD  CBC  DIFFERENTIAL  SEDIMENTATION RATE  I-STAT BETA HCG BLOOD, ED ([facility], [facility], [facility] ONLY)    EKG None  Radiology US Pelvis Transvanginal Non-ob (tv Only)  Result Date: 02/20/2018 CLINICAL DATA:  Severe pelvic pain beginning today. Nausea and vomiting. Fever. EXAM: TRANSABDOMINAL AND TRANSVAGINAL ULTRASOUND OF PELVIS TECHNIQUE: Both transabdominal and transvaginal ultrasound examinations of the pelvis were performed. Transabdominal technique was performed for global imaging of the pelvis including uterus, ovaries, adnexal regions, and pelvic cul-de-sac. It was necessary to proceed with endovaginal exam following the transabdominal exam to visualize the endometrium and ovaries. COMPARISON:  None FINDINGS: Exam was technically difficult due to patient's pain and inability to tolerate transabdominal and transvaginal scanning. Uterus Measurements: 7.7 x 3.3 x 5.3 cm. Suboptimal visualization due to patient's inability to empty her bladder for transvaginal sonography. No definite fibroids or other mass visualized. Endometrium Thickness: 2 mm.  No focal abnormality visualized. Right ovary Measurements: A large cystic lesion is seen in the right adnexa which measures approximately 8 cm but is incompletely visualized on this study. There appears to be some internal complexity, although detail in this exam is suboptimal. Left ovary Measurements: Not  visualized, however no definite left adnexal mass identified. Other findings No definite abnormal free fluid identified. IMPRESSION: Technically suboptimal exam. Large right adnexal cystic lesion measuring approximately 8 cm is incompletely visualized, but has some internal complexity. Recommend abdomen pelvis CT with contrast for further evaluation in the acute setting if indicated, or pelvis MRI without and with contrast as an outpatient. Electronically Signed   By: Earle Gell M.D.   On: 02/20/2018 17:08   US Pelvis Complete  Result Date: 02/20/2018 CLINICAL DATA:  Severe pelvic pain beginning today. Nausea and vomiting. Fever. EXAM: TRANSABDOMINAL AND TRANSVAGINAL ULTRASOUND OF PELVIS TECHNIQUE: Both transabdominal and transvaginal ultrasound examinations of the pelvis were performed. Transabdominal technique was performed for global imaging of the pelvis including uterus, ovaries, adnexal regions, and pelvic cul-de-sac. It was necessary to proceed with endovaginal exam following the transabdominal exam to visualize the endometrium and ovaries. COMPARISON:  None FINDINGS: Exam was technically difficult due to patient's pain and inability to tolerate transabdominal and transvaginal scanning. Uterus Measurements: 7.7 x 3.3 x 5.3 cm. Suboptimal visualization due to patient's inability to empty her bladder for transvaginal sonography. No definite fibroids or other mass visualized. Endometrium Thickness: 2 mm.  No focal abnormality visualized. Right ovary Measurements: A large cystic lesion is seen in the right adnexa which measures approximately 8 cm but is incompletely visualized on this study. There appears to be some internal complexity, although detail in this exam is suboptimal. Left ovary Measurements: Not visualized, however no definite left adnexal mass identified. Other findings No definite abnormal free fluid identified. IMPRESSION: Technically suboptimal exam. Large right adnexal cystic lesion  measuring approximately 8 cm is incompletely visualized, but has some internal complexity. Recommend abdomen pelvis CT with contrast for further evaluation in the acute setting if indicated, or pelvis MRI without and with contrast as an outpatient. Electronically Signed   By: Earle Gell M.D.   On: 02/20/2018 17:08   Ct Abdomen Pelvis W Contrast  Result Date: 02/20/2018 CLINICAL DATA:  Lower abdominal pain, nausea, vomiting EXAM: CT ABDOMEN AND PELVIS WITH CONTRAST TECHNIQUE: Multidetector CT imaging of the abdomen and pelvis was performed using the standard protocol following bolus administration of intravenous contrast. CONTRAST:  157mL ISOVUE-300 IOPAMIDOL (ISOVUE-300) INJECTION 61% COMPARISON:  None. FINDINGS: Lower chest: Lung bases are clear. No effusions. Heart is normal size. Hepatobiliary: No focal hepatic abnormality. Gallbladder unremarkable. Pancreas: No focal abnormality or ductal dilatation. Spleen: No focal abnormality.  Normal size.  Adrenals/Urinary Tract: No adrenal abnormality. No focal renal abnormality. No stones or hydronephrosis. Urinary bladder is unremarkable. Stomach/Bowel: Appendix is normal. Stomach, large and small bowel grossly unremarkable. Vascular/Lymphatic: No evidence of aneurysm or adenopathy. Prominent vessels in the adnexal regions bilaterally. 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 No adnexal masses. Reproductive: Prominent periuterine vessels. Uterus unremarkable otherwise. No adnexal masses. Other: Small amount of free fluid in the pelvis.  No free air. Musculoskeletal: No acute bony abnormality. 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 Normal appendix. Electronically Signed   By: Rolm Baptise M.D.   On: 02/20/2018 18:28    Procedures Procedures (including critical care time)  Medications Ordered in ED Medications    HYDROmorphone (DILAUDID) injection 1 mg (has no administration in time range)  ondansetron (ZOFRAN) injection 4 mg (has no administration in time range)  sodium chloride 0.9 % bolus 1,000 mL (0 mLs Intravenous Stopped 02/20/18 1710)  ondansetron (ZOFRAN) injection 4 mg (4 mg Intravenous Given 02/20/18 1530)  morphine 4 MG/ML injection 4 mg (4 mg Intravenous Given 02/20/18 1530)  HYDROmorphone (DILAUDID) injection 1 mg (1 mg Intravenous Given 02/20/18 1602)  HYDROmorphone (DILAUDID) injection 1 mg (1 mg Intravenous Given 02/20/18 1710)  iopamidol (ISOVUE-300) 61 % injection 100 mL (100 mLs Intravenous Contrast Given 02/20/18 1801)     Initial Impression / Assessment and Plan / ED Course  I have reviewed the triage vital signs and the nursing notes.  Pertinent labs & imaging results that were available during my care of the patient were reviewed by me and considered in my medical decision making (see chart for details).     Required several doses of pain medication for her before able to perform pelvic ultrasound.  Questionable right ovarian cystic mass on ultrasound.  CT with prominent periuterine and adnexal vessels likely due to gonadal vein reflux or pelvic congestion syndrome.  Patient still requiring significant amount of narcotics to control symptoms.  Discussed with Dr. Glo Herring.  Will admit for symptom management.  Final Clinical Impressions(s) / ED Diagnoses   Final diagnoses:  Pelvic pain in female    ED Discharge Orders    None       Julianne Rice, MD 02/20/18 [id number]

## 2018-02-20 NOTE — H&P (Signed)
Valerie Huber is an 32 y.o. female who is admitted for lower abdominal pain progressive x x3 days, progressively worse and significantly worse today She is a .[id number] who is now 5 months status post tubal ligation and endometrial ablation.  She was in her usual state of health.  She is been having no menses and no abdominal pain of significance until this week.   On Monday she developed loose stools, and on Tuesday she noted a light pink discharge per vagina with the first bleeding since the endometrial ablation in May.  She began to have pain on Tuesday as well and she describes the bowel movements is right remaining loose through the day and she describes today that the bowel movements have become "like I am peeing".  She vomited at home twice today she had 2 loose watery bowel movements during her time in the emergency room during evaluation, laboratory work pelvic ultrasound followed by CT.  She also says she vomited twice in the emergency room Pain has been the major focus of her complaints she has lower abdominal guarding and deep pelvic pain and suprapubic lower abdominal pain.  She is required 3 doses of Dilaudid 1 mg, and we have added Phenergan and increase the dose of Dilaudid to every 3 hours.  She can not take Toradol with history of severe reaction to ibuprofen and Flexeril.  CT scan and ultrasound are interesting and shows somewhat different information.  Pelvic ultrasound was obtained first and appears to have an 8 cm fluid-filled mass in the left lower quadrant on transabdominal and transvaginal ultrasound, however the subsequent CT scan does not show any cystic structure.  CT is notable upon my personal review for the marked amount of gas throughout the small and large bowel extending into the rectosigmoid, those noted the patient had a 2 watery diarrhea episodes during the time of evaluation Laboratory evaluation includes white count is entirely normal.  Electrolytes are  normal   Pertinent Gynecological History: Menses: Status post endometrial ablation with no bleeding since procedure in May.  The patient reports light pink discharge per vagina earlier this week, but on speculum exam today there is absolutely no blood, only clear cervical mucus with no purulence Bleeding: See above Contraception: tubal ligation DES exposure: unknown Blood transfusions: none Sexually transmitted diseases: no past history, GC and Chlamydia collected in office today and results pending Previous GYN Procedures: Laparoscopic tubal ligation in May along with endometrial ablation with apparent good results  Last mammogram:  Date:  Last pap: normal Date: 10/28/2017 OB History: G 3, P 2012   Menstrual History: Menarche age:  Patient's last menstrual period was 10/21/2017.    Past Medical History:  Diagnosis Date  . Asthma   . Endometriosis   . Irregular periods 12/31/2014  . Pelvic pain in female 12/31/2014  . Pregnant 04/14/2015  . Second degree uterine prolapse 12/31/2014  . Seizures ([place])   . Vaginal Pap smear, abnormal     Past Surgical History:  Procedure Laterality Date  . DILITATION & CURRETTAGE/HYSTROSCOPY WITH NOVASURE ABLATION N/A 10/08/2017   Procedure: DILATATION & CURETTAGE/HYSTEROSCOPY WITH MINERVA  ENDOMETRIAL ABLATION (Procedure #2);  Surgeon: [name], MD;  Location: [facility];  Service: Gynecology;  Laterality: N/A;  . ESOPHAGOGASTRODUODENOSCOPY N/A 12/10/2014   SLF: hematoemesis due to moderate erosive gastritis, duoedentitis, and duodenal ulcer  . LEEP    . surgery for endometriosis  2005  . TUBAL LIGATION Bilateral 10/08/2017   Procedure: BILATERAL TUBAL STERILIZATION WITH FALLOPE  RINGS APPLICATION (Procedure #1);  Surgeon: [name], MD;  Location: [facility];  Service: Gynecology;  Laterality: Bilateral;    Family History  Problem Relation Age of Onset  . Cancer Mother        kidney   . Asthma Mother   . Hypertension Mother   . Cancer  Paternal Grandmother   . Colon cancer Neg Hx     Social History:  reports that she has quit smoking. Her smoking use included cigarettes. She has a 3.50 pack-year smoking history. She has never used smokeless tobacco. She reports that she does not drink alcohol or use drugs.  Allergies:  Allergies  Allergen Reactions  . Ibuprofen Anaphylaxis    Throat, eyes, lips swollen  . Flexeril [Cyclobenzaprine] Hives    Medications Prior to Admission  Medication Sig Dispense Refill Last Dose  . acetaminophen (TYLENOL) 500 MG tablet Take 500-2,000 mg by mouth every 6 (six) hours as needed.    02/20/2018 at morning  . EPINEPHrine (EPI-PEN) 0.3 mg/0.3 mL SOAJ injection Inject 0.3 mg into the muscle daily as needed (for anaphylactic reactions). Reported on 11/23/2015   unknown  . methocarbamol (ROBAXIN) 500 MG tablet Take 1 tablet (500 mg total) by mouth every 8 (eight) hours as needed for muscle spasms. (Patient not taking: Reported on 02/20/2018) 8 tablet 0 Not Taking at Unknown time  . oxyCODONE-acetaminophen (PERCOCET/ROXICET) 5-325 MG tablet Take 1-2 tablets by mouth every 8 (eight) hours as needed for severe pain. (Patient not taking: Reported on 02/20/2018) 6 tablet 0 Not Taking at Unknown time    ROS  Blood pressure 94/60, pulse 78, temperature 98.2 F (36.8 C), temperature source Oral, resp. rate 16, last menstrual period 10/21/2017, SpO2 100 %.  Temp:  [98.2 F (36.8 C)-100.7 F (38.2 C)] 98.6 F (37 C) (10/10 2151) Pulse Rate:  [78-119] 88 (10/10 2151) Resp:  [16] 16 (10/10 1920) BP: (92-153)/(52-100) 92/52 (10/10 2151) SpO2:  [97 %-100 %] 100 % (10/10 2151) Weight:  [71.8 kg] 71.8 kg (10/10 1401)  Physical Exam  Constitutional: She appears well-developed and well-nourished. She appears distressed.  HENT:  Head: Normocephalic.  Eyes: Pupils are equal, round, and reactive to light.  Neck: Neck supple. No thyromegaly present.  Cardiovascular: Regular rhythm.  Respiratory: Effort  normal.  GI: Soft. There is tenderness. There is guarding.  Lower abdominal tenderness and guarding, no rebound  Genitourinary: Vagina normal. No vaginal discharge found.  Genitourinary Comments: External genitalia normal.  Vaginal sidewall exam in office mildly tender.  Rectal exam tolerated poorly.  Single digit exam showed the previously mentioned pelvic sidewall discomfort, and then marked tenderness over the bladder as well as with contact of the cervix.  The cervix is nonpurulent the uterus is midposition. Adnexa are vaguely tender, bilaterally the entire pelvic exam is described as a pain level 10 during exam but there is no induration.  It is difficult to determine if its primary pelvic pain or referred pain. The pelvic CT shows a large network of pelvic vessels but does not suggest pelvic thrombosis  Musculoskeletal: Normal range of motion.  Neurological: She is alert.  Skin: Skin is warm and dry.  Psychiatric: She has a normal mood and affect.  Patient is appears to be in obvious pain, with blood pressure elevated likely due to the discomfort    Results for orders placed or performed during the hospital encounter of 02/20/18 (from the past 24 hour(s))  Urinalysis, Routine w reflex microscopic  Status: Abnormal   Collection Time: 02/20/18  2:55 PM  Result Value Ref Range   Color, Urine YELLOW YELLOW   APPearance CLEAR CLEAR   Specific Gravity, Urine 1.039 (H) 1.005 - 1.030   pH 5.0 5.0 - 8.0   Glucose, UA NEGATIVE NEGATIVE mg/dL   Hgb urine dipstick SMALL (A) NEGATIVE   Bilirubin Urine SMALL (A) NEGATIVE   Ketones, ur 5 (A) NEGATIVE mg/dL   Protein, ur NEGATIVE NEGATIVE mg/dL   Nitrite NEGATIVE NEGATIVE   Leukocytes, UA NEGATIVE NEGATIVE   RBC / HPF 21-50 0 - 5 RBC/hpf   WBC, UA 0-5 0 - 5 WBC/hpf   Bacteria, UA NONE SEEN NONE SEEN   Squamous Epithelial / LPF 0-5 0 - 5   Mucus PRESENT   Rapid urine drug screen (hospital performed)     Status: Abnormal   Collection Time:  02/20/18  3:00 PM  Result Value Ref Range   Opiates POSITIVE (A) NONE DETECTED   Cocaine NONE DETECTED NONE DETECTED   Benzodiazepines NONE DETECTED NONE DETECTED   Amphetamines NONE DETECTED NONE DETECTED   Tetrahydrocannabinol POSITIVE (A) NONE DETECTED   Barbiturates NONE DETECTED NONE DETECTED  Lipase, blood     Status: None   Collection Time: 02/20/18  3:18 PM  Result Value Ref Range   Lipase 26 11 - 51 U/L  Comprehensive metabolic panel     Status: Abnormal   Collection Time: 02/20/18  3:18 PM  Result Value Ref Range   Sodium 139 135 - 145 mmol/L   Potassium 3.4 (L) 3.5 - 5.1 mmol/L   Chloride 109 98 - 111 mmol/L   CO2 20 (L) 22 - 32 mmol/L   Glucose, Bld 91 70 - 99 mg/dL   BUN 11 6 - 20 mg/dL   Creatinine, Ser 0.68 0.44 - 1.00 mg/dL   Calcium 9.4 8.9 - 10.3 mg/dL   Total Protein 8.6 (H) 6.5 - 8.1 g/dL   Albumin 4.7 3.5 - 5.0 g/dL   AST 17 15 - 41 U/L   ALT 16 0 - 44 U/L   Alkaline Phosphatase 51 38 - 126 U/L   Total Bilirubin 0.8 0.3 - 1.2 mg/dL   GFR calc non Af Amer >60 >60 mL/min   GFR calc Af Amer >60 >60 mL/min   Anion gap 10 5 - 15  CBC     Status: None   Collection Time: 02/20/18  3:18 PM  Result Value Ref Range   WBC 6.0 4.0 - 10.5 K/uL   RBC 3.96 3.87 - 5.11 MIL/uL   Hemoglobin 12.3 12.0 - 15.0 g/dL   HCT 38.2 36.0 - 46.0 %   MCV 96.5 80.0 - 100.0 fL   MCH 31.1 26.0 - 34.0 pg   MCHC 32.2 30.0 - 36.0 g/dL   RDW 12.3 11.5 - 15.5 %   Platelets 302 150 - 400 K/uL   nRBC 0.0 0.0 - 0.2 %  Differential     Status: None   Collection Time: 02/20/18  3:18 PM  Result Value Ref Range   Neutrophils Relative % 31 %   Neutro Abs 1.9 1.7 - 7.7 K/uL   Lymphocytes Relative 58 %   Lymphs Abs 3.5 0.7 - 4.0 K/uL   Monocytes Relative 10 %   Monocytes Absolute 0.6 0.1 - 1.0 K/uL   Eosinophils Relative 1 %   Eosinophils Absolute 0.0 0.0 - 0.5 K/uL   Basophils Relative 0 %   Basophils Absolute 0.0 0.0 - 0.1 K/uL  Immature Granulocytes 0 %   Abs Immature Granulocytes  0.01 0.00 - 0.07 K/uL  Sedimentation rate     Status: None   Collection Time: 02/20/18  3:18 PM  Result Value Ref Range   Sed Rate 22 0 - 22 mm/hr  I-Stat beta hCG blood, ED     Status: None   Collection Time: 02/20/18  3:29 PM  Result Value Ref Range   I-stat hCG, quantitative <5.0 <5 mIU/mL   Comment 3            US Pelvis Transvanginal Non-ob (tv Only)  Result Date: 02/20/2018 CLINICAL DATA:  Severe pelvic pain beginning today. Nausea and vomiting. Fever. EXAM: TRANSABDOMINAL AND TRANSVAGINAL ULTRASOUND OF PELVIS TECHNIQUE: Both transabdominal and transvaginal ultrasound examinations of the pelvis were performed. Transabdominal technique was performed for global imaging of the pelvis including uterus, ovaries, adnexal regions, and pelvic cul-de-sac. It was necessary to proceed with endovaginal exam following the transabdominal exam to visualize the endometrium and ovaries. COMPARISON:  None FINDINGS: Exam was technically difficult due to patient's pain and inability to tolerate transabdominal and transvaginal scanning. Uterus Measurements: 7.7 x 3.3 x 5.3 cm. Suboptimal visualization due to patient's inability to empty her bladder for transvaginal sonography. No definite fibroids or other mass visualized. Endometrium Thickness: 2 mm.  No focal abnormality visualized. Right ovary Measurements: A large cystic lesion is seen in the right adnexa which measures approximately 8 cm but is incompletely visualized on this study. There appears to be some internal complexity, although detail in this exam is suboptimal. Left ovary Measurements: Not visualized, however no definite left adnexal mass identified. Other findings No definite abnormal free fluid identified. IMPRESSION: Technically suboptimal exam. Large right adnexal cystic lesion measuring approximately 8 cm is incompletely visualized, but has some internal complexity. Recommend abdomen pelvis CT with contrast for further evaluation in the acute  setting if indicated, or pelvis MRI without and with contrast as an outpatient. Electronically Signed   By: Earle Gell M.D.   On: 02/20/2018 17:08   US Pelvis Complete  Result Date: 02/20/2018 CLINICAL DATA:  Severe pelvic pain beginning today. Nausea and vomiting. Fever. EXAM: TRANSABDOMINAL AND TRANSVAGINAL ULTRASOUND OF PELVIS TECHNIQUE: Both transabdominal and transvaginal ultrasound examinations of the pelvis were performed. Transabdominal technique was performed for global imaging of the pelvis including uterus, ovaries, adnexal regions, and pelvic cul-de-sac. It was necessary to proceed with endovaginal exam following the transabdominal exam to visualize the endometrium and ovaries. COMPARISON:  None FINDINGS: Exam was technically difficult due to patient's pain and inability to tolerate transabdominal and transvaginal scanning. Uterus Measurements: 7.7 x 3.3 x 5.3 cm. Suboptimal visualization due to patient's inability to empty her bladder for transvaginal sonography. No definite fibroids or other mass visualized. Endometrium Thickness: 2 mm.  No focal abnormality visualized. Right ovary Measurements: A large cystic lesion is seen in the right adnexa which measures approximately 8 cm but is incompletely visualized on this study. There appears to be some internal complexity, although detail in this exam is suboptimal. Left ovary Measurements: Not visualized, however no definite left adnexal mass identified. Other findings No definite abnormal free fluid identified. IMPRESSION: Technically suboptimal exam. Large right adnexal cystic lesion measuring approximately 8 cm is incompletely visualized, but has some internal complexity. Recommend abdomen pelvis CT with contrast for further evaluation in the acute setting if indicated, or pelvis MRI without and with contrast as an outpatient. Electronically Signed   By: Earle Gell M.D.   On: 02/20/2018 17:08  Ct Abdomen Pelvis W Contrast  Result Date:  02/20/2018 CLINICAL DATA:  Lower abdominal pain, nausea, vomiting EXAM: CT ABDOMEN AND PELVIS WITH CONTRAST TECHNIQUE: Multidetector CT imaging of the abdomen and pelvis was performed using the standard protocol following bolus administration of intravenous contrast. CONTRAST:  179mL ISOVUE-300 IOPAMIDOL (ISOVUE-300) INJECTION 61% COMPARISON:  None. FINDINGS: Lower chest: Lung bases are clear. No effusions. Heart is normal size. Hepatobiliary: No focal hepatic abnormality. Gallbladder unremarkable. Pancreas: No focal abnormality or ductal dilatation. Spleen: No focal abnormality.  Normal size. Adrenals/Urinary Tract: No adrenal abnormality. No focal renal abnormality. No stones or hydronephrosis. Urinary bladder is unremarkable. Stomach/Bowel: Appendix is normal. Stomach, large and small bowel grossly unremarkable. Vascular/Lymphatic: No evidence of aneurysm or adenopathy. Prominent vessels in the adnexal regions bilaterally. 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 No adnexal masses. Reproductive: Prominent periuterine vessels. Uterus unremarkable otherwise. No adnexal masses. Other: Small amount of free fluid in the pelvis.  No free air. Musculoskeletal: No acute bony abnormality. 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 Normal appendix. Electronically Signed   By: Rolm Baptise M.D.   On: 02/20/2018 18:28    Assessment/Plan: 1.  Marked lower abdominal and pelvic pain in a patient who is low risk for pelvic infection, being status post tubal ligation as well as endometrial ablation. 2.  3 days of marked loose stools and associated pelvic pain without blood per rectum 3.  Nonpurulent cervix, GC and Chlamydia collected and pending Plan: We will admit for IV analgesic management antiemetics, stool for enteric pathogen's, enteric precautions.  Will obtain GI consult in a.m.  I  really do not think this is a GYN pelvic problem at this point but pain levels are too extreme to send home until more clear pictures obtained  [name] 02/20/2018, 9:38 PM

## 2018-02-20 NOTE — ED Triage Notes (Signed)
Patient reports lower abdominal pain for the past 3 days. V/D with vomiting and fever onset today.

## 2018-02-20 NOTE — Progress Notes (Addendum)
Patient ID: Valerie Huber, female   DOB: Sep 04, 1985, 32 y.o.   MRN: [id number]  workin appointment due to patient's dramatic appearance at arrival to make an appointment  [facility] Visit  _0 @            Patient name: Valerie Huber MRN [id number]  Date of birth: 09/03/1985  CC & HPI:  Valerie Huber is a 32 y.o. female presenting today as workin appt for severe abdominal pain for the past 3 days. She came to the office in tears wanting to schedule an appointment but she was worked in because the pain was so severe. She started vomiting and running a fever this morning (100.62F). She had a D&C and BTL w/ Falope ring application on 0/12/6759. She has had no periods since the tubal ligation and endometrial ablation, as expected. She is in the same longstanding relationship, and denies domestic instability. She starting spotting on Monday but has not had a period since the surgery. The spotting began before the pain.  ROS:  ROS + severe abdominal pain + vomiting x 1 d + fever x 1d All systems are negative except as noted in the HPI and PMH.  No diarrhea, or constipation Pertinent History Reviewed:   Reviewed: D&C 10/08/2017, also tubal ligation fallope rings. Medical         Past Medical History:  Diagnosis Date  . Asthma   . Endometriosis   . Irregular periods 12/31/2014  . Pelvic pain in female 12/31/2014  . Pregnant 04/14/2015  . Second degree uterine prolapse 12/31/2014  . Seizures ([place])   . Vaginal Pap smear, abnormal                               Surgical Hx:    Past Surgical History:  Procedure Laterality Date  . DILITATION & CURRETTAGE/HYSTROSCOPY WITH NOVASURE ABLATION N/A 10/08/2017   Procedure: DILATATION & CURETTAGE/HYSTEROSCOPY WITH MINERVA  ENDOMETRIAL ABLATION (Procedure #2);  Surgeon: [name], MD;  Location: [facility];  Service: Gynecology;  Laterality: N/A;  . ESOPHAGOGASTRODUODENOSCOPY N/A 12/10/2014   SLF: hematoemesis due to moderate erosive  gastritis, duoedentitis, and duodenal ulcer  . LEEP    . surgery for endometriosis  2005  . TUBAL LIGATION Bilateral 10/08/2017   Procedure: BILATERAL TUBAL STERILIZATION WITH FALLOPE RINGS APPLICATION (Procedure #1);  Surgeon: [name], MD;  Location: [facility];  Service: Gynecology;  Laterality: Bilateral;   Medications: Reviewed & Updated - see associated section                       Current Outpatient Medications:  .  acetaminophen (TYLENOL) 500 MG tablet, Take 500-2,000 mg by mouth every 6 (six) hours as needed. , Disp: , Rfl:  .  doxycycline (VIBRAMYCIN) 100 MG capsule, Take 1 capsule (100 mg total) by mouth 2 (two) times daily. (Patient not taking: Reported on 11/26/2017), Disp: 14 capsule, Rfl: 0 .  EPINEPHrine (EPI-PEN) 0.3 mg/0.3 mL SOAJ injection, Inject 0.3 mg into the muscle daily as needed (for anaphylactic reactions). Reported on 11/23/2015, Disp: , Rfl:  .  methocarbamol (ROBAXIN) 500 MG tablet, Take 1 tablet (500 mg total) by mouth every 8 (eight) hours as needed for muscle spasms., Disp: 8 tablet, Rfl: 0 .  metroNIDAZOLE (FLAGYL) 500 MG tablet, Take 1 tablet (500 mg total) by mouth 2 (two) times daily. (Patient not taking: Reported on 10/28/2017), Disp: 14 tablet, Rfl:  0 .  oxyCODONE-acetaminophen (PERCOCET/ROXICET) 5-325 MG tablet, Take 1-2 tablets by mouth every 8 (eight) hours as needed for severe pain., Disp: 6 tablet, Rfl: 0  Social History: Reviewed -  reports that she has quit smoking. Her smoking use included cigarettes. She has a 3.50 pack-year smoking history. She has never used smokeless tobacco.  Objective Findings:  Vitals: Blood pressure (!) 153/100, pulse (!) 103, temperature (!) 100.7 F (38.2 C), height _0  (1.626 m), weight 158 lb 6.4 oz (71.8 kg).  PHYSICAL EXAMINATION General appearance - crying, in severe distress, moving actively, not able to be comfortable. Mental status - alert, oriented to person, place, and time Did NOT give urine  sample. PELVIC  Vaginal sidewalls perceived as mildly tender. Bladder - marked  tenderness with contact ("10" on scale) Cervix - nonpurulent, normal mucous on cervix, no bleeding or d/c noted  Uterus - marked tenderness with contact, same amount of pain as contacting the bladder ("10" on scale). Retroverted. Bowel - slight tenderness with contact   Assessment & Plan:   A:  1. GC/CHL done today 2. Severe pelvic pain -- possible PID or appendicitis ?  P:  1. Refer to ER for evaluation with Ultrasound today also labs to include CBC, ESR, serum pregnancy test 2. Pain medication prior to ultrasound, ?Toradol IV 3. May need CT to rule out appendicitis if appendix not visualized on ultrasound 4. F/u 4 days here in office 5. Discussed with Dr Roderic Palau at [facility] ED. 6. Would likely need u/a and UDS  By signing my name below, I, De Burrs, attest that this documentation has been prepared under the direction and in the presence of [name], MD. Electronically Signed: De Burrs, Medical Scribe. 02/20/18. 2:09 PM.  I personally performed the services described in this documentation, which was SCRIBED in my presence. The recorded information has been reviewed and considered accurate. It has been edited as necessary during review. [name], MD

## 2018-02-21 DIAGNOSIS — R1031 Right lower quadrant pain: Secondary | ICD-10-CM

## 2018-02-21 DIAGNOSIS — R1032 Left lower quadrant pain: Secondary | ICD-10-CM

## 2018-02-21 DIAGNOSIS — K625 Hemorrhage of anus and rectum: Secondary | ICD-10-CM

## 2018-02-21 DIAGNOSIS — R197 Diarrhea, unspecified: Secondary | ICD-10-CM

## 2018-02-21 DIAGNOSIS — R630 Anorexia: Secondary | ICD-10-CM

## 2018-02-21 LAB — CBC WITH DIFFERENTIAL/PLATELET
Abs Immature Granulocytes: 0.01 10*3/uL (ref 0.00–0.07)
BASOS ABS: 0 10*3/uL (ref 0.0–0.1)
Basophils Relative: 0 %
EOS PCT: 1 %
Eosinophils Absolute: 0 10*3/uL (ref 0.0–0.5)
HEMATOCRIT: 34 % — AB (ref 36.0–46.0)
HEMOGLOBIN: 11.1 g/dL — AB (ref 12.0–15.0)
Immature Granulocytes: 0 %
LYMPHS ABS: 3 10*3/uL (ref 0.7–4.0)
LYMPHS PCT: 56 %
MCH: 31.2 pg (ref 26.0–34.0)
MCHC: 32.6 g/dL (ref 30.0–36.0)
MCV: 95.5 fL (ref 80.0–100.0)
MONO ABS: 0.6 10*3/uL (ref 0.1–1.0)
Monocytes Relative: 11 %
NRBC: 0 % (ref 0.0–0.2)
Neutro Abs: 1.7 10*3/uL (ref 1.7–7.7)
Neutrophils Relative %: 32 %
Platelets: 271 10*3/uL (ref 150–400)
RBC: 3.56 MIL/uL — ABNORMAL LOW (ref 3.87–5.11)
RDW: 11.9 % (ref 11.5–15.5)
WBC: 5.3 10*3/uL (ref 4.0–10.5)

## 2018-02-21 MED ORDER — HYDROMORPHONE HCL 1 MG/ML IJ SOLN
1.0000 mg | Freq: Once | INTRAMUSCULAR | Status: AC
  Administered 2018-02-21: 1 mg via INTRAVENOUS
  Filled 2018-02-21: qty 1

## 2018-02-21 MED ORDER — HYDROMORPHONE HCL 1 MG/ML IJ SOLN
1.0000 mg | INTRAMUSCULAR | Status: DC | PRN
  Administered 2018-02-21 – 2018-02-22 (×7): 1 mg via INTRAVENOUS
  Filled 2018-02-21 (×8): qty 1

## 2018-02-21 NOTE — Progress Notes (Signed)
Subjective: Valerie Huber note appreciated.  Patient reports tolerating PO.  Had an episode at 4 am that sharp grabby pain in LLQ was perceived as extreme, even tho only 1 hour p last dose dilaudid.   Objective: I have reviewed patient's vital signs and labs. CBC Latest Ref Rng & Units 02/20/2018 10/28/2017 10/23/2017  WBC 4.0 - 10.5 K/uL 6.0 5.2 6.9  Hemoglobin 12.0 - 15.0 g/dL 12.3 11.9 12.7  Hematocrit 36.0 - 46.0 % 38.2 37.9 38.2  Platelets 150 - 400 K/uL 302 283 282    Physical Examination: General appearance - alert, well appearing, and in no distress Abdomen - tenderness noted both lower quadrants, hyperactive BS , not tympanic. Extremities - peripheral pulses normal, no pedal edema, no clubbing or cyanosis No vag bleeding   Assessment/Plan: Bilateral LAP, multifactoral. Will monitor today Probable d/c in am  LOS: 1 day    [name] 02/21/2018, 2:16 PM

## 2018-02-21 NOTE — Progress Notes (Signed)
Call to the patients room.  Patient writhing in pain.  Vitals taken pain medication given with no relief.  Attending MD notified and new orders obtained and carried out.  Will continue to monitor.

## 2018-02-21 NOTE — Consult Note (Addendum)
Referring Provider: Dr. Glo Herring Primary Care Physician:  Raiford Simmonds., PA-C Primary Gastroenterologist:  Dr. Oneida Alar  Date of Admission: 02/20/18 Date of Consultation: 02/21/18  Reason for Consultation: Pelvic pain/abdominal pain  HPI:  Valerie Huber is a 32 y.o. female with a past medical history of asthma, endometriosis, irregular periods, pelvic pain, seizures, second-degree uterine prolapse.  ER physician note and hospitalist H&P reviewed.  Patient presented yesterday with complaints of lower abdominal pain, noted to be "writhing in bed".  Noted lower abdominal pain for the previous 3 days, worsened over the last few hours.  Noted associated nausea, vomiting, diarrhea.  She was evaluated by gynecology earlier today as an outpatient and referred to the emergency department for further work-up.  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  She was admitted for further management due to significant ongoing pain.  Gynecology reviewed the CT and found consistent with marked amount of gas throughout the small and large bowel extending into the rectosigmoid in the setting of admitted to watery diarrhea episodes.  Oncology noted low risk for pelvic infection, doubt gynecological pelvic problems and recommend consulting GI.  Stool studies were ordered.  Review of labs finds positive for opiates and THC.  Lipase normal.  CMP with mild hypokalemia with a potassium of 3.4, normal creatinine, normal LFTs.  CBC entirely normal.  Sedimentation rate normal.  Serum beta hCG negative.  GI pathogen panel is pending.  Today she states she began having abdominal pain on Monday, diarrhea and vaginal spotting started on Tuesday.  Pain became progressively worse until she could not tolerate it  anymore.  Abdominal pain is periumbilical and lower abdomen, ranges from 5/10 to 10/10.  Denies sick contacts, recent travel, undercooked/uncooked foods.  No new medications either prescription, over-the-counter, herbal, supplement, or otherwise.  Her loose stools have been 2-3 a day, watery.  Prior to this she was having intermittent constipation with nodular type stools.  She has been having intermittent rectal bleeding with her constipation, although no straining noted.  She has not had a bowel movement since yesterday.  Stool studies still waiting to be collected.  Denies nausea and vomiting.  Does have a decreased appetite.  She is on a clear liquid diet and oral intake does not particularly exacerbate her symptoms; she is tolerating her diet at this point. No other GI complaints at this time.   Past Medical History:  Diagnosis Date  . Asthma   . Endometriosis   . Irregular periods 12/31/2014  . Pelvic pain in female 12/31/2014  . Pregnant 04/14/2015  . Second degree uterine prolapse 12/31/2014  . Seizures ([place])   . Vaginal Pap smear, abnormal     Past Surgical History:  Procedure Laterality Date  . DILITATION & CURRETTAGE/HYSTROSCOPY WITH NOVASURE ABLATION N/A 10/08/2017   Procedure: DILATATION & CURETTAGE/HYSTEROSCOPY WITH MINERVA  ENDOMETRIAL ABLATION (Procedure #2);  Surgeon: [name], MD;  Location: [facility];  Service: Gynecology;  Laterality: N/A;  . ESOPHAGOGASTRODUODENOSCOPY N/A 12/10/2014   SLF: hematoemesis due to moderate erosive gastritis, duoedentitis, and duodenal ulcer  . LEEP    . surgery for endometriosis  2005  . TUBAL LIGATION Bilateral 10/08/2017   Procedure: BILATERAL TUBAL STERILIZATION WITH FALLOPE RINGS APPLICATION (Procedure #1);  Surgeon: [name], MD;  Location: [facility];  Service: Gynecology;  Laterality: Bilateral;    Prior to Admission medications   Medication Sig Start Date End Date Taking? Authorizing Provider  acetaminophen (TYLENOL) 500 MG  tablet Take 500-2,000 mg by mouth every 6 (six) hours as needed.    Yes [provider]  EPINEPHrine (EPI-PEN) 0.3 mg/0.3 mL SOAJ injection Inject 0.3 mg into the muscle daily as needed (for anaphylactic reactions). Reported on 11/23/2015   Yes [provider]  methocarbamol (ROBAXIN) 500 MG tablet Take 1 tablet (500 mg total) by mouth every 8 (eight) hours as needed for muscle spasms. Patient not taking: Reported on 02/20/2018 11/26/17   Davonna Belling, MD  oxyCODONE-acetaminophen (PERCOCET/ROXICET) 5-325 MG tablet Take 1-2 tablets by mouth every 8 (eight) hours as needed for severe pain. Patient not taking: Reported on 02/20/2018 11/26/17   Davonna Belling, MD    Current Facility-Administered Medications  Medication Dose Route Frequency Provider Last Rate Last Dose  . acetaminophen (TYLENOL) tablet 650 mg  650 mg Oral Q4H PRN [name], MD      . dextrose 5 % and 0.9 % NaCl with KCl 20 mEq/L infusion   Intravenous Continuous [name], MD 100 mL/hr at 02/21/18 0750    . enoxaparin (LOVENOX) injection 40 mg  40 mg Subcutaneous Q24H [name], MD   40 mg at 02/21/18 0749  . HYDROmorphone (DILAUDID) injection 1 mg  1 mg Intravenous Q2H PRN [name], MD   1 mg at 02/21/18 1234  . ondansetron (ZOFRAN) tablet 4 mg  4 mg Oral Q6H PRN [name], MD       Or  . ondansetron [facility]) injection 4 mg  4 mg Intravenous Q6H PRN [name], MD      . prenatal multivitamin tablet 1 tablet  1 tablet Oral Q1200 [name], MD      . zolpidem [facility]) tablet 5 mg  5 mg Oral QHS PRN [name], MD   5 mg at 02/20/18 2314    Allergies as of 02/20/2018 - Review Complete 02/20/2018  Allergen Reaction Noted  . Ibuprofen Anaphylaxis 01/01/2014  . Flexeril [cyclobenzaprine] Hives 12/25/2012    Family History  Problem Relation Age of Onset  . Cancer Mother        kidney   . Asthma Mother   . Hypertension Mother   . Cancer Paternal  Grandmother   . Colon cancer Neg Hx     Social History   Socioeconomic History  . Marital status: Single    Spouse name: Not on file  . Number of children: Not on file  . Years of education: Not on file  . Highest education level: Not on file  Occupational History  . Occupation: Housecleaning  Social Needs  . Financial resource strain: Not on file  . Food insecurity:    Worry: Not on file    Inability: Not on file  . Transportation needs:    Medical: Not on file    Non-medical: Not on file  Tobacco Use  . Smoking status: Former Smoker    Packs/day: 0.25    Years: 14.00    Pack years: 3.50    Types: Cigarettes  . Smokeless tobacco: Never Used  . Tobacco comment: QUIT SMOKING ON JULY 11th. STATES WOULD ONLY HAVE A FEW CIGARETTES A WEEK PRIOR TO THIS.   Substance and Sexual Activity  . Alcohol use: No    Alcohol/week: 0.0 standard drinks  . Drug use: No  . Sexual  activity: Not Currently    Birth control/protection: Injection  Lifestyle  . Physical activity:    Days per week: Not on file    Minutes per session: Not on file  . Stress: Not on file  Relationships  . Social connections:    Talks on phone: Not on file    Gets together: Not on file    Attends religious service: Not on file    Active member of club or organization: Not on file    Attends meetings of clubs or organizations: Not on file    Relationship status: Not on file  . Intimate partner violence:    Fear of current or ex partner: Not on file    Emotionally abused: Not on file    Physically abused: Not on file    Forced sexual activity: Not on file  Other Topics Concern  . Not on file  Social History Narrative  . Not on file    Review of Systems: General: Negative for anorexia, weight loss, fever, chills, fatigue, weakness. ENT: Negative for hoarseness, difficulty swallowing. CV: Negative for chest pain, angina, palpitations, peripheral edema.  Respiratory: Negative for dyspnea at rest, cough,  sputum, wheezing.  GI: See history of present illness. Derm: Negative for rash or itching.  Endo: Negative for unusual weight change.  Heme: Negative for bruising or bleeding. Allergy: Negative for rash or hives.  Physical Exam: Vital signs in last 24 hours: Temp:  [98.2 F (36.8 C)-100.7 F (38.2 C)] 99.1 F (37.3 C) (10/11 0411) Pulse Rate:  [78-119] 97 (10/11 0411) Resp:  [16-24] 24 (10/11 0411) BP: (92-153)/(52-104) 137/104 (10/11 0411) SpO2:  [97 %-100 %] 98 % (10/11 0411) Weight:  [71.5 kg-71.8 kg] 71.5 kg (10/11 0411) Last BM Date: 02/20/18 General:   Alert,  Well-developed, well-nourished, pleasant and cooperative in NAD Head:  Normocephalic and atraumatic. Eyes:  Sclera clear, no icterus. Conjunctiva pink. Ears:  Normal auditory acuity. Neck:  Supple; no masses or thyromegaly. Lungs:  Clear throughout to auscultation. No wheezes, crackles, or rhonchi. No acute distress. Heart:  Regular rate and rhythm; no murmurs, clicks, rubs,  or gallops. Abdomen:  Soft, and nondistended. Moderate to significant TTP mid- to lower-abdomen. No masses, hepatosplenomegaly or hernias noted. Normal bowel sounds, without guarding, and without rebound.   Rectal:  Deferred.   Msk:  Symmetrical without gross deformities. Pulses:  Normal bilateral DP pulses noted. Extremities:  Without clubbing or edema. Neurologic:  Alert and  oriented x4;  grossly normal neurologically. Skin:  Intact without significant lesions or rashes. Psych:  Alert and cooperative. Normal mood and affect.  Intake/Output from previous day: 10/10 0701 - 10/11 0700 In: 1604.8 [I.V.:604.8; IV Piggyback:1000] Out: -  Intake/Output this shift: Total I/O In: 240 [P.O.:240] Out: -   Lab Results: Recent Labs    02/20/18 1518  WBC 6.0  HGB 12.3  HCT 38.2  PLT 302   BMET Recent Labs    02/20/18 1518  NA 139  K 3.4*  CL 109  CO2 20*  GLUCOSE 91  BUN 11  CREATININE 0.68  CALCIUM 9.4   LFT Recent Labs     02/20/18 1518  PROT 8.6*  ALBUMIN 4.7  AST 17  ALT 16  ALKPHOS 51  BILITOT 0.8   PT/INR No results for input(s): LABPROT, INR in the last 72 hours. Hepatitis Panel No results for input(s): HEPBSAG, HCVAB, HEPAIGM, HEPBIGM in the last 72 hours. C-Diff No results for input(s): CDIFFTOX in the last 72 hours.  Studies/Results: US Pelvis Transvanginal Non-ob (tv Only)  Result Date: 02/20/2018 CLINICAL DATA:  Severe pelvic pain beginning today. Nausea and vomiting. Fever. EXAM: TRANSABDOMINAL AND TRANSVAGINAL ULTRASOUND OF PELVIS TECHNIQUE: Both transabdominal and transvaginal ultrasound examinations of the pelvis were performed. Transabdominal technique was performed for global imaging of the pelvis including uterus, ovaries, adnexal regions, and pelvic cul-de-sac. It was necessary to proceed with endovaginal exam following the transabdominal exam to visualize the endometrium and ovaries. COMPARISON:  None FINDINGS: Exam was technically difficult due to patient's pain and inability to tolerate transabdominal and transvaginal scanning. Uterus Measurements: 7.7 x 3.3 x 5.3 cm. Suboptimal visualization due to patient's inability to empty her bladder for transvaginal sonography. No definite fibroids or other mass visualized. Endometrium Thickness: 2 mm.  No focal abnormality visualized. Right ovary Measurements: A large cystic lesion is seen in the right adnexa which measures approximately 8 cm but is incompletely visualized on this study. There appears to be some internal complexity, although detail in this exam is suboptimal. Left ovary Measurements: Not visualized, however no definite left adnexal mass identified. Other findings No definite abnormal free fluid identified. IMPRESSION: Technically suboptimal exam. Large right adnexal cystic lesion measuring approximately 8 cm is incompletely visualized, but has some internal complexity. Recommend abdomen pelvis CT with contrast for further evaluation in  the acute setting if indicated, or pelvis MRI without and with contrast as an outpatient. Electronically Signed   By: Earle Gell M.D.   On: 02/20/2018 17:08   US Pelvis Complete  Result Date: 02/20/2018 CLINICAL DATA:  Severe pelvic pain beginning today. Nausea and vomiting. Fever. EXAM: TRANSABDOMINAL AND TRANSVAGINAL ULTRASOUND OF PELVIS TECHNIQUE: Both transabdominal and transvaginal ultrasound examinations of the pelvis were performed. Transabdominal technique was performed for global imaging of the pelvis including uterus, ovaries, adnexal regions, and pelvic cul-de-sac. It was necessary to proceed with endovaginal exam following the transabdominal exam to visualize the endometrium and ovaries. COMPARISON:  None FINDINGS: Exam was technically difficult due to patient's pain and inability to tolerate transabdominal and transvaginal scanning. Uterus Measurements: 7.7 x 3.3 x 5.3 cm. Suboptimal visualization due to patient's inability to empty her bladder for transvaginal sonography. No definite fibroids or other mass visualized. Endometrium Thickness: 2 mm.  No focal abnormality visualized. Right ovary Measurements: A large cystic lesion is seen in the right adnexa which measures approximately 8 cm but is incompletely visualized on this study. There appears to be some internal complexity, although detail in this exam is suboptimal. Left ovary Measurements: Not visualized, however no definite left adnexal mass identified. Other findings No definite abnormal free fluid identified. IMPRESSION: Technically suboptimal exam. Large right adnexal cystic lesion measuring approximately 8 cm is incompletely visualized, but has some internal complexity. Recommend abdomen pelvis CT with contrast for further evaluation in the acute setting if indicated, or pelvis MRI without and with contrast as an outpatient. Electronically Signed   By: Earle Gell M.D.   On: 02/20/2018 17:08   Ct Abdomen Pelvis W Contrast  Result  Date: 02/20/2018 CLINICAL DATA:  Lower abdominal pain, nausea, vomiting EXAM: CT ABDOMEN AND PELVIS WITH CONTRAST TECHNIQUE: Multidetector CT imaging of the abdomen and pelvis was performed using the standard protocol following bolus administration of intravenous contrast. CONTRAST:  168mL ISOVUE-300 IOPAMIDOL (ISOVUE-300) INJECTION 61% COMPARISON:  None. FINDINGS: Lower chest: Lung bases are clear. No effusions. Heart is normal size. Hepatobiliary: No focal hepatic abnormality. Gallbladder unremarkable. Pancreas: No focal abnormality or ductal dilatation. Spleen: No focal abnormality.  Normal size.  Adrenals/Urinary Tract: No adrenal abnormality. No focal renal abnormality. No stones or hydronephrosis. Urinary bladder is unremarkable. Stomach/Bowel: Appendix is normal. Stomach, large and small bowel grossly unremarkable. Vascular/Lymphatic: No evidence of aneurysm or adenopathy. Prominent vessels in the adnexal regions bilaterally. 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 No adnexal masses. Reproductive: Prominent periuterine vessels. Uterus unremarkable otherwise. No adnexal masses. Other: Small amount of free fluid in the pelvis.  No free air. Musculoskeletal: No acute bony abnormality. 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 Normal appendix. Electronically Signed   By: Rolm Baptise M.D.   On: 02/20/2018 18:28    Impression: Pleasant 32 year old female with complaints of lower abdominal pain associated with watery stools for the previous 3 to 4 days.  Gynecological evaluation feels likely some element of GI issue at play.  Stool studies are waiting to be collected as she has not had a bowel movement since last night.  Before all this started she was having intermittent constipation and rectal bleeding; hemoglobin normal today and unlikely to have significant bleeding.  Now  having 2-3 watery stools a day.  CT (upon admitting physician personal review) with marked amount of gas throughout the small and large bowel extending in the rectosigmoid colon.  No other GI findings.   Her pain is likely multifactorial in nature given her gynecological findings on imaging as well as possible marked colonic gas associated with diarrhea.  She very well could simply have significant gastroenteritis superimposed on other etiology.  She does appear to have symptoms out of proportion to findings.  I feel a lot of answers will come in the form of GI pathogen panel.  Plan: 1. Collect GI pathogen panel with next bowel movement. 2. Continue pain management per admitting physician 3. Supportive measures 4. Further recommendations to follow further labs and evaluation   Thank you for allowing [place] to participate in the care of [place], DNP, AGNP-C Adult & Gerontological Nurse Practitioner [facility]    LOS: 1 day     02/21/2018, 12:57 PM

## 2018-02-22 DIAGNOSIS — R102 Pelvic and perineal pain: Secondary | ICD-10-CM

## 2018-02-22 LAB — GC/CHLAMYDIA PROBE AMP
Chlamydia trachomatis, NAA: NEGATIVE
Neisseria gonorrhoeae by PCR: NEGATIVE

## 2018-02-22 MED ORDER — OXYCODONE-ACETAMINOPHEN 5-325 MG PO TABS: 1.0000 | ORAL_TABLET | Freq: Three times a day (TID) | ORAL | 0 refills | Status: DC | PRN

## 2018-02-22 MED ORDER — PROMETHAZINE HCL 25 MG/ML IJ SOLN
12.5000 mg | Freq: Once | INTRAMUSCULAR | Status: AC
  Administered 2018-02-22: 12.5 mg via INTRAVENOUS
  Filled 2018-02-22: qty 1

## 2018-02-22 MED ORDER — PROMETHAZINE HCL 25 MG PO TABS: 25.0000 mg | ORAL_TABLET | Freq: Four times a day (QID) | ORAL | 0 refills | Status: DC | PRN

## 2018-02-22 NOTE — Progress Notes (Signed)
Subjective: Patient reports one single episode of awakening with nausea, threw up once.  Had no loose bowel movements.  Abdominal pain is completely resolved this a.m.    Objective: I have reviewed patient's vital signs and medications.  General: cooperative, appears stated age and no distress Resp: clear to auscultation bilaterally GI: soft, non-tender; bowel sounds normal; no masses,  no organomegaly and normal findings: bowel sounds normal Vaginal Bleeding: none   Assessment/Plan: Lower abdominal pain secondary to gastroenteritis, resolved Plan: Discharge home on Phenergan as needed oral, small number of Vicodin, with follow-up in 7 to 14 days in office  LOS: 2 days    Valerie Huber 02/22/2018, 7:38 AM

## 2018-02-22 NOTE — Discharge Summary (Signed)
Physician Discharge Summary  Patient ID: Valerie Huber MRN: [id number] DOB/AGE: [birth year] 32 y.o.  Admit date: 02/20/2018 Discharge date: 02/22/2018  Admission Diagnoses: Lower abdominal pain, diarrhea questionable left lower quadrant cystic mass  Discharge Diagnoses:  Active Problems:   Pelvic pain   Bilateral lower abdominal pain   Diarrhea of presumed infectious origin   Rectal bleeding   Poor appetite   Discharged Condition: good  Hospital Course: *HPI:  Shaquille Murdy is a 32 y.o. female with a past medical history of asthma, endometriosis, irregular periods, pelvic pain, seizures, second-degree uterine prolapse.  ER physician note and hospitalist H&P reviewed.  Patient presented yesterday with complaints of lower abdominal pain, noted to be "writhing in bed".  Noted lower abdominal pain for the previous 3 days, worsened over the last few hours.  Noted associated nausea, vomiting, diarrhea.  She was evaluated by gynecology earlier today as an outpatient and referred to the emergency department for further work-up.  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  She was admitted for further management due to significant ongoing pain.  Gynecology reviewed the CT and found consistent with marked amount of gas throughout the small and large bowel extending into the rectosigmoid in the setting of admitted to watery diarrhea episodes.  Oncology noted low risk for pelvic infection, doubt gynecological pelvic problems and recommend consulting GI.  Stool studies were ordered.** The patient received IV hydration, antiemetics and analgesics.  At times she would have episodic pain that was quite dramatic and the patient presentation but then spontaneous resolution.  GI consult  was obtained and appreciated.  No additional interventions necessary. Of significance, admission pelvic ultrasound was technically difficult due to patient discomfort and suggested a left adnexal cystic structure.  Subsequent CT was obtained and there was no mass present.  This occurred during the time the patient was having dramatic watery diarrhea.  The possibility of this being fluid in the bowel remains a possibility.  Follow-up ultrasound will be obtained at outpatient follow-up. Consults: GI  Significant Diagnostic Studies: labs:  CBC Latest Ref Rng & Units 02/21/2018 02/20/2018 10/28/2017  WBC 4.0 - 10.5 K/uL 5.3 6.0 5.2  Hemoglobin 12.0 - 15.0 g/dL 11.1(L) 12.3 11.9  Hematocrit 36.0 - 46.0 % 34.0(L) 38.2 37.9  Platelets 150 - 400 K/uL 271 302 283     Treatments: IV hydration and antiemetics analgesics  Discharge Exam: Blood pressure (!) 96/55, pulse 69, temperature 98.3 F (36.8 C), temperature source Oral, resp. rate 18, weight 73.8 kg, last menstrual period 10/21/2017, SpO2 100 %. General appearance: alert, cooperative and no distress Resp: clear to auscultation bilaterally GI: soft, non-tender; bowel sounds normal; no masses,  no organomegaly Extremities: Homans sign is negative, no sign of DVT  Disposition: Discharge disposition: 01-Home or Self Care       Discharge Instructions    Call MD for:  persistant nausea and vomiting   Complete by:  As directed    Call MD for:  severe uncontrolled pain   Complete by:  As directed    Call MD for:  temperature >100.4   Complete by:  As directed    Diet - low sodium heart healthy   Complete by:  As directed    Discharge instructions   Complete by:  As directed    Gradually return  to normal diet as tolerated.  Phenergan tablets are available x6 tablets in case of recurrent nausea   Increase activity slowly   Complete by:  As directed      Allergies as of 02/22/2018      Reactions   Ibuprofen Anaphylaxis   Throat, eyes,  lips swollen   Flexeril [cyclobenzaprine] Hives      Medication List    STOP taking these medications   acetaminophen 500 MG tablet Commonly known as:  TYLENOL   methocarbamol 500 MG tablet Commonly known as:  ROBAXIN     TAKE these medications   EPINEPHrine 0.3 mg/0.3 mL Soaj injection Commonly known as:  EPI-PEN Inject 0.3 mg into the muscle daily as needed (for anaphylactic reactions). Reported on 11/23/2015   oxyCODONE-acetaminophen 5-325 MG tablet Commonly known as:  PERCOCET/ROXICET Take 1-2 tablets by mouth every 8 (eight) hours as needed for severe pain.   promethazine 25 MG tablet Commonly known as:  PHENERGAN Take 1 tablet (25 mg total) by mouth every 6 (six) hours as needed for nausea or vomiting (nausea).      Follow-up Information    [name], MD Follow up in 2 week(s).   Specialties:  Obstetrics and Gynecology, Radiology Contact information: [place] [address] [phone]           Signed: [name] 02/22/2018, 7:48 AM

## 2018-02-22 NOTE — Plan of Care (Signed)
Adequate for discharge.

## 2018-02-24 ENCOUNTER — Ambulatory Visit (INDEPENDENT_AMBULATORY_CARE_PROVIDER_SITE_OTHER): Payer: Medicaid Other | Admitting: Obstetrics and Gynecology

## 2018-02-24 ENCOUNTER — Telehealth: Payer: Self-pay | Admitting: Obstetrics and Gynecology

## 2018-02-24 ENCOUNTER — Encounter: Payer: Self-pay | Admitting: Obstetrics and Gynecology

## 2018-02-24 VITALS — BP 121/83 | HR 86 | Ht 64.0 in | Wt 160.2 lb

## 2018-02-24 DIAGNOSIS — R102 Pelvic and perineal pain: Secondary | ICD-10-CM | POA: Diagnosis not present

## 2018-02-24 MED ORDER — OXYCODONE-ACETAMINOPHEN 5-325 MG PO TABS: 1.0000 | ORAL_TABLET | Freq: Four times a day (QID) | ORAL | 0 refills | Status: DC | PRN

## 2018-02-24 MED ORDER — PROMETHAZINE HCL 25 MG PO TABS: 25.0000 mg | ORAL_TABLET | Freq: Four times a day (QID) | ORAL | 0 refills | Status: DC | PRN

## 2018-02-24 NOTE — Progress Notes (Addendum)
Patient ID: Valerie Huber, female   DOB: 1985/08/08, 32 y.o.   MRN: [id number]    [facility] Visit  '@DATE' @            Patient name: Valerie Huber MRN [id number]  Date of birth: September 11, 1985  CC & HPI:  Valerie Huber is a 32 y.o. female presenting today for f/u to pelvic painsince being sent home from the hospital after admission 10/10 to 02/22/2018.  On the day of discharge evaluation included a CT scan which showed no evidence of the cystic pelvic mass suggested on ultrasound.  During the interval between the 2 test she had loose watery stool.  On the day of discharge she tried eating eggs and sausage from hospital and immediately had to go to the bathroom, and the bowel movement was loose and described as watery.  She did not have another bowel movement until today but was able to eat clear liquids, and she had gas yesterday but has been eating chicken broth no solid food.  Wanted to back to the hospital Sunday due to the pain. Is wondering if cysts on ovaries is causing her such discomfort  Past medical history: Approximately a month ago,. If she ate chicken pork or ribs  It made her stomach hurt, and cramping, crampy discomfort.  she ate fish and didn't make her stomach upset..  Hasn't had sexual intercourse in 3 months due to pain.  When she last tried to be sexually active she experienced a lot of pain. She was diagnosis and having endometriosis back in 2005, would hurt "this bad" and feel like she will go to the emergency room every time she had a menses. The pain is described as a constant steady pain for which she cannot stay still. ROS:  ROS +diarrhea +right quadrant abdominal pain +suprapubic pain -fever All systems are negative except as noted in the HPI and PMH.  [id number] Tubal ligation was performed at time of endometrial ablation 4 months ago by J[facility] F Patient describes having had laparoscopy for "endometriosis for similar type pain many years ago in [place], at [facility] and after the procedure was on shots for several months (sounds like Lupron or Depo-Provera) and did fine  Patient has been using Percocet over the weekend from her discharge from the hospital as you 6 Percocet he gets about 6 hours relief from each pill.  She is also used 3 Phenergan tablets Pertinent History Reviewed:   Reviewed: D&C hystroscopy with novasure ablation on 10/08/2017 Medical         Past Medical History:  Diagnosis Date  . Asthma   . Endometriosis   . Irregular periods 12/31/2014  . Pelvic pain in female 12/31/2014  . Pregnant 04/14/2015  . Second degree uterine prolapse 12/31/2014  . Seizures ([place])   . Vaginal Pap smear, abnormal                               Surgical Hx:    Past Surgical History:  Procedure Laterality Date  . DILITATION & CURRETTAGE/HYSTROSCOPY WITH NOVASURE ABLATION N/A 10/08/2017   Procedure: DILATATION & CURETTAGE/HYSTEROSCOPY WITH MINERVA  ENDOMETRIAL ABLATION (Procedure #2);  Surgeon: [name], MD;  Location: [facility];  Service: Gynecology;  Laterality: N/A;  . ESOPHAGOGASTRODUODENOSCOPY N/A 12/10/2014   SLF: hematoemesis due to moderate erosive gastritis, duoedentitis, and duodenal ulcer  . LEEP    . surgery for endometriosis  2005  . TUBAL LIGATION Bilateral 10/08/2017   Procedure: BILATERAL TUBAL STERILIZATION WITH FALLOPE RINGS APPLICATION (Procedure #1);  Surgeon: [name], MD;  Location: [facility];  Service: Gynecology;  Laterality: Bilateral;   Medications: Reviewed & Updated - see associated section                       Current Outpatient Medications:  .  acetaminophen (TYLENOL) 500 MG tablet, Take 500 mg by mouth every 6 (six) hours as needed., Disp: , Rfl:  .  EPINEPHrine (EPI-PEN) 0.3 mg/0.3 mL SOAJ injection, Inject 0.3 mg into the muscle daily as needed (for anaphylactic reactions). Reported on 11/23/2015, Disp: , Rfl:  .  oxyCODONE-acetaminophen (PERCOCET/ROXICET) 5-325 MG tablet,  Take 1-2 tablets by mouth every 8 (eight) hours as needed for severe pain. (Patient not taking: Reported on 02/24/2018), Disp: 6 tablet, Rfl: 0 .  promethazine (PHENERGAN) 25 MG tablet, Take 1 tablet (25 mg total) by mouth every 6 (six) hours as needed for nausea or vomiting (nausea). (Patient not taking: Reported on 02/24/2018), Disp: 6 tablet, Rfl: 0   Social History: Reviewed -  reports that she has quit smoking. Her smoking use included cigarettes. She has a 3.50 pack-year smoking history. She has never used smokeless tobacco.  Objective Findings:  Vitals: Blood pressure 121/83, pulse 86, height '5\' 4"'  (1.626 m), weight 160 lb 3.2 oz (72.7 kg).  PHYSICAL EXAMINATION General appearance - alert, oriented to person, place, and time and in mild to moderate distress Mental status - alert, oriented to person, place, and time, normal behavior, speech, dress, motor activity, and thought processes Abdomen: bowel sounds normal, loose bowel movements  PELVIC ultrasound was performed by Tessa Lerner using bedside ultrasound transvaginal. U/s: In great discomfort during exam, essentially unchanged from her baseline discomfort, bladder appeared moderately full but normal, small cluster of nabothian cyst in inside cervix 4 mm to 10 mm  cluster of at least 3.  Bladder seems to be less uncomfortable than the cervix and uterus contact which seems painful Endometrial stripe very thin 3 mm  External genitalia: normal in apperance Vagina - normal physiological, non purulence, blood or discoloration Cervix - nabothian cyst clusters at least 3 Uterus - retroverted, normal amount of fluid, moderately uncomfortable  CBC Latest Ref Rng & Units 02/21/2018 02/20/2018 10/28/2017  WBC 4.0 - 10.5 K/uL 5.3 6.0 5.2  Hemoglobin 12.0 - 15.0 g/dL 11.1(L) 12.3 11.9  Hematocrit 36.0 - 46.0 % 34.0(L) 38.2 37.9  Platelets 150 - 400 K/uL 271 302 283  esr: from admit:  Assessment & Plan:   A:  1. Chronic pelvic pain, 4  months status post endometrial ablation and tubal ligation 2. Distant history of diagnosis of endometriosis with similar pelvic discomfort 3.  Functional GI pain which I think is an incidental finding 4.  No evidence of adnexal cysts on transvaginal ultrasound today 5.  History of LEEP for abnormal cells of the cervix P:  1. 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 2. Given the severity of the pain, the fact that the uterus is had similar problems and seems to be the source of most of her discomfort, will recommend laparoscopic-assisted vaginal hysterectomy 3 refil percocet5 4 refil phenergan.   By signing my name below, I, [name], attest that this documentation has been prepared under the direction and in the presence of [name], MD. Electronically Signed: [place].  02/24/18. 11:14 AM.  I personally performed the services described in this documentation, which was SCRIBED in my presence. The recorded information has been reviewed and considered accurate. It has been edited as necessary during review. [name], MD

## 2018-02-24 NOTE — Telephone Encounter (Signed)
Unable to reach pt to see if she's well and if she's planning to keep appt.

## 2018-02-26 ENCOUNTER — Other Ambulatory Visit: Payer: Self-pay | Admitting: Obstetrics and Gynecology

## 2018-02-26 DIAGNOSIS — G8929 Other chronic pain: Secondary | ICD-10-CM

## 2018-02-26 DIAGNOSIS — R102 Pelvic and perineal pain: Principal | ICD-10-CM

## 2018-02-27 ENCOUNTER — Other Ambulatory Visit: Payer: Self-pay | Admitting: Obstetrics and Gynecology

## 2018-02-27 ENCOUNTER — Encounter (HOSPITAL_COMMUNITY): Payer: Self-pay

## 2018-02-27 ENCOUNTER — Encounter (HOSPITAL_COMMUNITY)
Admission: RE | Admit: 2018-02-27 | Discharge: 2018-02-27 | Disposition: A | Discharge status: Home / Self Care | Payer: Medicaid Other | Source: Ambulatory Visit | Attending: Obstetrics and Gynecology | Admitting: Obstetrics and Gynecology

## 2018-02-27 DIAGNOSIS — Z01812 Encounter for preprocedural laboratory examination: Secondary | ICD-10-CM | POA: Diagnosis not present

## 2018-02-27 DIAGNOSIS — R102 Pelvic and perineal pain: Secondary | ICD-10-CM

## 2018-02-27 DIAGNOSIS — G8929 Other chronic pain: Secondary | ICD-10-CM

## 2018-02-27 LAB — COMPREHENSIVE METABOLIC PANEL
ALT: 17 U/L (ref 0–44)
ANION GAP: 8 (ref 5–15)
AST: 18 U/L (ref 15–41)
Albumin: 4.4 g/dL (ref 3.5–5.0)
Alkaline Phosphatase: 45 U/L (ref 38–126)
BUN: 10 mg/dL (ref 6–20)
CHLORIDE: 108 mmol/L (ref 98–111)
CO2: 24 mmol/L (ref 22–32)
CREATININE: 0.64 mg/dL (ref 0.44–1.00)
Calcium: 9.1 mg/dL (ref 8.9–10.3)
Glucose, Bld: 87 mg/dL (ref 70–99)
POTASSIUM: 3.2 mmol/L — AB (ref 3.5–5.1)
SODIUM: 140 mmol/L (ref 135–145)
Total Bilirubin: 0.5 mg/dL (ref 0.3–1.2)
Total Protein: 7.4 g/dL (ref 6.5–8.1)

## 2018-02-27 LAB — CBC WITH DIFFERENTIAL/PLATELET
ABS IMMATURE GRANULOCYTES: 0.01 10*3/uL (ref 0.00–0.07)
BASOS PCT: 0 %
Basophils Absolute: 0 10*3/uL (ref 0.0–0.1)
EOS ABS: 0 10*3/uL (ref 0.0–0.5)
EOS PCT: 1 %
HCT: 35.5 % — ABNORMAL LOW (ref 36.0–46.0)
Hemoglobin: 11.4 g/dL — ABNORMAL LOW (ref 12.0–15.0)
Immature Granulocytes: 0 %
Lymphocytes Relative: 49 %
Lymphs Abs: 2 10*3/uL (ref 0.7–4.0)
MCH: 31.4 pg (ref 26.0–34.0)
MCHC: 32.1 g/dL (ref 30.0–36.0)
MCV: 97.8 fL (ref 80.0–100.0)
MONO ABS: 0.3 10*3/uL (ref 0.1–1.0)
Monocytes Relative: 8 %
Neutro Abs: 1.7 10*3/uL (ref 1.7–7.7)
Neutrophils Relative %: 42 %
PLATELETS: 283 10*3/uL (ref 150–400)
RBC: 3.63 MIL/uL — ABNORMAL LOW (ref 3.87–5.11)
RDW: 12.1 % (ref 11.5–15.5)
WBC: 4 10*3/uL (ref 4.0–10.5)
nRBC: 0 % (ref 0.0–0.2)

## 2018-02-27 LAB — TYPE AND SCREEN
ABO/RH(D): A POS
ANTIBODY SCREEN: NEGATIVE

## 2018-02-27 LAB — HCG, SERUM, QUALITATIVE: PREG SERUM: NEGATIVE

## 2018-02-27 LAB — SEDIMENTATION RATE: SED RATE: 12 mm/h (ref 0–22)

## 2018-02-27 MED ORDER — POTASSIUM CHLORIDE CRYS ER 20 MEQ PO TBCR: 20.0000 meq | EXTENDED_RELEASE_TABLET | Freq: Two times a day (BID) | ORAL | 0 refills | Status: DC

## 2018-02-27 NOTE — Patient Instructions (Signed)
Your procedure is scheduled on: 03/04/2018  Report to [name] at   7:15  AM.  Call this number if you have problems the morning of surgery: [phone]   Remember:   Do not drink or eat food:After Midnight.  :  Take these medicines the morning of surgery with A SIP OF WATER: oxycodone, and phenergan if needed   Do not wear jewelry, make-up or nail polish.  Do not wear lotions, powders, or perfumes. You may wear deodorant.  Do not shave 48 hours prior to surgery. Men may shave face and neck.  Do not bring valuables to the hospital.  Contacts, dentures or bridgework may not be worn into surgery.  Leave suitcase in the car. After surgery it may be brought to your room.  For patients admitted to the hospital, checkout time is 11:00 AM the day of discharge.   Patients discharged the day of surgery will not be allowed to drive home.    Special Instructions: Shower using CHG night before surgery and shower the day of surgery use CHG.  Use special wash - you have one bottle of CHG for all showers.  You should use approximately 1/2 of the bottle for each shower.  Magnesium Citrate oral solution What is this medicine? MAGNESIUM CITRATE (mag NEE zee um SI treyt) is a saline laxative. It is used to treat occasional constipation, but it should not be used regularly for this purpose. This medicine may be used for other purposes; ask your health care provider or pharmacist if you have questions. COMMON BRAND NAME(S): Citroma What should I tell my health care provider before I take this medicine? They need to know if you have any of these conditions: -are on a low magnesium or low sodium diet -change in bowel habits for 2 weeks -colostomy or ileostomy -constipation after using another laxative for 7 days -diabetes -kidney disease -rectal bleeding -stomach pain, nausea, or vomiting -an unusual or allergic reaction to magnesium citrate, other magnesium products, other medicines, foods,  dyes, or preservatives -pregnant or trying to get pregnant -breast-feeding How should I use this medicine? Take this medicine by mouth. Follow the directions on the package or prescription label. Use a specially marked spoon or container to measure each dose. Ask your pharmacist if you do not have one. Household spoons are not accurate. Drink a full glass of fluid with each dose of this medicine. This medicine may taste better if it is chilled before you drink it. Do not take your medicine more often than directed. Talk to your pediatrician regarding the use of this medicine in children. While this drug may be prescribed for children as young as 44 years of age for selected conditions, precautions do apply. Overdosage: If you think you have taken too much of this medicine contact a poison control center or emergency room at once. NOTE: This medicine is only for you. Do not share this medicine with others. What if I miss a dose? This does not apply; this medicine is not for regular use. What may interact with this medicine? -cellulose sodium phosphate -digoxin -edetate disodium, EDTA -medicines for bone strength like etidronate, ibandronate, risedronate -sodium polystyrene sulfonate -some antibiotics like ciprofloxacin, doxycycline, gatifloxacin, levofloxacin, tetracycline -vitamin D This list may not describe all possible interactions. Give your health care provider a list of all the medicines, herbs, non-prescription drugs, or dietary supplements you use. Also tell them if you smoke, drink alcohol, or use illegal drugs. Some items may interact with  your medicine. What should I watch for while using this medicine? Tell your doctor or healthcare professional if your symptoms do not start to get better or if they get worse. Do not take any other medicine by mouth within 2 hours of taking this medicine. What side effects may I notice from receiving this medicine? Side effects that you should  report to your doctor or health care professional as soon as possible: -allergic reactions like skin rash, itching or hives, swelling of the face, lips, or tongue -breathing problems -chest pain -fast, irregular heartbeat -muscle weakness -nausea or vomiting Side effects that usually do not require medical attention (report to your doctor or health care professional if they continue or are bothersome): -diarrhea -stomach upset This list may not describe all possible side effects. Call your doctor for medical advice about side effects. You may report side effects to FDA at 1-800-FDA-1088. Where should I keep my medicine? Keep out of the reach of children. Store at room temperature or in the refrigerator between 8 and 30 degrees C (46 and 86 degrees F). Throw away any unused medicine 24 hours after opening the bottle. Throw away unopened bottles of medicine after the expiration date. NOTE: This sheet is a summary. It may not cover all possible information. If you have questions about this medicine, talk to your doctor, pharmacist, or health care provider.  2018 Elsevier/Gold Standard (2007-11-18 17:27:50)  Laparoscopically Assisted Vaginal Hysterectomy, Care After Refer to this sheet in the next few weeks. These instructions provide you with information on caring for yourself after your procedure. Your health care provider may also give you more specific instructions. Your treatment has been planned according to current medical practices, but problems sometimes occur. Call your health care provider if you have any problems or questions after your procedure. What can I expect after the procedure? After your procedure, it is typical to have the following:  Abdominal pain. You will be given pain medicine to control it.  Sore throat from the breathing tube that was inserted during surgery.  Follow these instructions at home:  Only take over-the-counter or prescription medicines for pain,  discomfort, or fever as directed by your health care provider.  Do not take aspirin. It can cause bleeding.  Do not drive when taking pain medicine.  Follow your health care provider's advice regarding diet, exercise, lifting, driving, and general activities.  Resume your usual diet as directed and allowed.  Get plenty of rest and sleep.  Do not douche, use tampons, or have sexual intercourse for at least 6 weeks, or until your health care provider gives you permission.  Change your bandages (dressings) as directed by your health care provider.  Monitor your temperature and notify your health care provider of a fever.  Take showers instead of baths for 2-3 weeks.  Do not drink alcohol until your health care provider gives you permission.  If you develop constipation, you may take a mild laxative with your health care provider's permission. Bran foods may help with constipation problems. Drinking enough fluids to keep your urine clear or pale yellow may help as well.  Try to have someone home with you for 1-2 weeks to help around the house.  Keep all of your follow-up appointments as directed by your health care provider. Contact a health care provider if:  You have swelling, redness, or increasing pain around your incision sites.  You have pus coming from your incision.  You notice a bad smell coming from  your incision.  Your incision breaks open.  You feel dizzy or lightheaded.  You have pain or bleeding when you urinate.  You have persistent diarrhea.  You have persistent nausea and vomiting.  You have abnormal vaginal discharge.  You have a rash.  You have any type of abnormal reaction or develop an allergy to your medicine.  You have poor pain control with your prescribed medicine. Get help right away if:  You have a fever.  You have severe abdominal pain.  You have chest pain.  You have shortness of breath.  You faint.  You have pain, swelling, or  redness in your leg.  You have heavy vaginal bleeding with blood clots. This information is not intended to replace advice given to you by your health care provider. Make sure you discuss any questions you have with your health care provider. Document Released: 04/19/2011 Document Revised: 10/06/2015 Document Reviewed: 11/13/2012 Elsevier Interactive Patient Education  2017 [place] Anesthesia, Adult, Care After These instructions provide you with information about caring for yourself after your procedure. Your health care provider may also give you more specific instructions. Your treatment has been planned according to current medical practices, but problems sometimes occur. Call your health care provider if you have any problems or questions after your procedure. What can I expect after the procedure? After the procedure, it is common to have:  Vomiting.  A sore throat.  Mental slowness.  It is common to feel:  Nauseous.  Cold or shivery.  Sleepy.  Tired.  Sore or achy, even in parts of your body where you did not have surgery.  Follow these instructions at home: For at least 24 hours after the procedure:  Do not: ? Participate in activities where you could fall or become injured. ? Drive. ? Use heavy machinery. ? Drink alcohol. ? Take sleeping pills or medicines that cause drowsiness. ? Make important decisions or sign legal documents. ? Take care of children on your own.  Rest. Eating and drinking  If you vomit, drink water, juice, or soup when you can drink without vomiting.  Drink enough fluid to keep your urine clear or pale yellow.  Make sure you have little or no nausea before eating solid foods.  Follow the diet recommended by your health care provider. General instructions  Have a responsible adult stay with you until you are awake and alert.  Return to your normal activities as told by your health care provider. Ask your health care  provider what activities are safe for you.  Take over-the-counter and prescription medicines only as told by your health care provider.  If you smoke, do not smoke without supervision.  Keep all follow-up visits as told by your health care provider. This is important. Contact a health care provider if:  You continue to have nausea or vomiting at home, and medicines are not helpful.  You cannot drink fluids or start eating again.  You cannot urinate after 8-12 hours.  You develop a skin rash.  You have fever.  You have increasing redness at the site of your procedure. Get help right away if:  You have difficulty breathing.  You have chest pain.  You have unexpected bleeding.  You feel that you are having a life-threatening or urgent problem. This information is not intended to replace advice given to you by your health care provider. Make sure you discuss any questions you have with your health care provider. Document Released: 08/06/2000 Document Revised:  10/03/2015 Document Reviewed: 04/14/2015 Elsevier Interactive Patient Education  [employer].

## 2018-02-27 NOTE — Progress Notes (Signed)
rx Kdur preop

## 2018-02-28 ENCOUNTER — Other Ambulatory Visit: Payer: Self-pay | Admitting: *Deleted

## 2018-02-28 ENCOUNTER — Telehealth: Payer: Self-pay | Admitting: *Deleted

## 2018-02-28 MED ORDER — POTASSIUM CHLORIDE ER 10 MEQ PO TBCR: 10.0000 meq | EXTENDED_RELEASE_TABLET | Freq: Two times a day (BID) | ORAL | 0 refills | Status: DC

## 2018-02-28 NOTE — Telephone Encounter (Signed)
Patient informed K+ low.  Prescription for potassium sent to pharmacy and is to take 2 times daily until her surgery.  Also advised to drink gatorade to help bring it up as well.  Verbalized understanding.

## 2018-03-03 ENCOUNTER — Encounter (HOSPITAL_COMMUNITY): Payer: Self-pay | Admitting: Anesthesiology

## 2018-03-03 ENCOUNTER — Telehealth: Payer: Self-pay | Admitting: Obstetrics and Gynecology

## 2018-03-03 NOTE — Telephone Encounter (Signed)
Today the Patient reports to the office that she's having loose dark stool, with diarrhea, the last few days. Pt to bring stool sample in to preop in a.m, along with stool cards given at the office this afternoon.  May need to postpone surgery til GI issues clarified, and if + hemoccult, will definitely postpone case.

## 2018-03-04 ENCOUNTER — Ambulatory Visit (HOSPITAL_COMMUNITY)
Admission: RE | Admit: 2018-03-04 | Payer: Medicaid Other | Source: Ambulatory Visit | Admitting: Obstetrics and Gynecology

## 2018-03-04 ENCOUNTER — Encounter: Payer: Self-pay | Admitting: Gastroenterology

## 2018-03-04 ENCOUNTER — Encounter: Payer: Self-pay | Admitting: Obstetrics and Gynecology

## 2018-03-04 ENCOUNTER — Encounter (HOSPITAL_COMMUNITY): Admission: RE | Payer: Self-pay | Source: Ambulatory Visit

## 2018-03-04 ENCOUNTER — Telehealth: Payer: Self-pay | Admitting: Obstetrics and Gynecology

## 2018-03-04 ENCOUNTER — Ambulatory Visit (INDEPENDENT_AMBULATORY_CARE_PROVIDER_SITE_OTHER): Payer: Medicaid Other | Admitting: Obstetrics and Gynecology

## 2018-03-04 VITALS — BP 115/75 | HR 81 | Ht 64.0 in | Wt 153.8 lb

## 2018-03-04 DIAGNOSIS — Z1211 Encounter for screening for malignant neoplasm of colon: Secondary | ICD-10-CM

## 2018-03-04 DIAGNOSIS — R197 Diarrhea, unspecified: Secondary | ICD-10-CM

## 2018-03-04 LAB — HEMOCCULT GUIAC POC 1CARD (OFFICE): Fecal Occult Blood, POC: POSITIVE — AB

## 2018-03-04 SURGERY — HYSTERECTOMY, VAGINAL, LAPAROSCOPY-ASSISTED
Anesthesia: General

## 2018-03-04 MED ORDER — HYDROCODONE-ACETAMINOPHEN 5-325 MG PO TABS: 1.0000 | ORAL_TABLET | Freq: Four times a day (QID) | ORAL | 0 refills | Status: DC | PRN

## 2018-03-04 MED ORDER — DICYCLOMINE HCL 10 MG PO CAPS: 10.0000 mg | ORAL_CAPSULE | Freq: Three times a day (TID) | ORAL | 0 refills | Status: DC

## 2018-03-04 NOTE — Progress Notes (Signed)
PATIENT SCHEDULED  °

## 2018-03-04 NOTE — Telephone Encounter (Signed)
Pt called to check on bowel status, she has had 3 loose bm's this a.m.  I am concerned that there is a GI process that is the source of her pain, and surgery should therefore be postponed til GI issue clarified. Pt in agreement.  Pt to be seen in office at 9 am for testing stool for blood, pt to bring stool sample for testing for enteric pathogens.

## 2018-03-04 NOTE — Progress Notes (Signed)
[facility] Visit  @DATE @            Patient name: Valerie Huber MRN [id number]  Date of birth: July 19, 1985  CC & HPI: Follow-up for diarrhea with dark stool.  Positive Hemoccult today plan for GYN surgery canceled  Valerie Huber is a 32 y.o. female presenting today for testing of Hemoccult, which is positive as continued evaluation for moderate to severe lower abdominal pain from the umbilicus to the symphysis pubis and from hip to hip.  The patient describes her self is having a diffuse lower abdominal pain is been severe now for at least couple weeks. Patient denies fever and chills but is been having multiple loose bowel movements per day.  She has had 3 bowel movements already today.  Yesterday she reported dark stool to the office and so surgery was canceled and Hemoccult cards collected at home and brought in today and tested as positive for blood Patient denies fever or chills.  No precipitating events Patient describes pain is "it just hurts all the time  ROS:  ROS Proximally 6 months status post endometrial ablation with laparoscopic tubal sterilization.  Review of the operative report shows no mention of adhesions or abnormal pelvic structures at the time of laparoscopic tubal ligation. When hospitalized recently there was a pelvic ultrasound which suggested cystic structure in the left adnexa, but the patient had loose bowel movement shortly after the ultrasound and CT did not confirm any mass.  Today an ultrasound was done by myself in the office (no charge) and there was no pelvic masses seen and small retroverted uterus with a thin endometrium, and what appears to be a normal amount of bowel fluid measuring 1.5 x 1.6 cm in the cul-de-sac  Pertinent History Reviewed:   Reviewed: Significant for  Medical         Past Medical History:  Diagnosis Date  . Asthma   . Endometriosis   . Irregular periods 12/31/2014  . Pelvic pain in female 12/31/2014  . Pregnant  04/14/2015  . Second degree uterine prolapse 12/31/2014  . Seizures ([place])   . Vaginal Pap smear, abnormal                               Surgical Hx:    Past Surgical History:  Procedure Laterality Date  . DILITATION & CURRETTAGE/HYSTROSCOPY WITH NOVASURE ABLATION N/A 10/08/2017   Procedure: DILATATION & CURETTAGE/HYSTEROSCOPY WITH MINERVA  ENDOMETRIAL ABLATION (Procedure #2);  Surgeon: [name], MD;  Location: [facility];  Service: Gynecology;  Laterality: N/A;  . ESOPHAGOGASTRODUODENOSCOPY N/A 12/10/2014   SLF: hematoemesis due to moderate erosive gastritis, duoedentitis, and duodenal ulcer  . LEEP    . surgery for endometriosis  2005  . TUBAL LIGATION Bilateral 10/08/2017   Procedure: BILATERAL TUBAL STERILIZATION WITH FALLOPE RINGS APPLICATION (Procedure #1);  Surgeon: [name], MD;  Location: [facility];  Service: Gynecology;  Laterality: Bilateral;   Medications: Reviewed & Updated - see associated section                       Current Outpatient Medications:  .  potassium chloride SA (K-DUR,KLOR-CON) 20 MEQ tablet, Take 1 tablet (20 mEq total) by mouth 2 (two) times daily., Disp: 10 tablet, Rfl: 0 .  promethazine (PHENERGAN) 25 MG tablet, Take 1 tablet (25 mg total) by mouth every 6 (six) hours as needed for nausea or  vomiting (nausea)., Disp: 30 tablet, Rfl: 0 .  dicyclomine (BENTYL) 10 MG capsule, Take 1 capsule (10 mg total) by mouth 3 (three) times daily before meals., Disp: 10 capsule, Rfl: 0 .  HYDROcodone-acetaminophen (NORCO/VICODIN) 5-325 MG tablet, Take 1 tablet by mouth every 6 (six) hours as needed for moderate pain. May take with ibuprofen, Disp: 15 tablet, Rfl: 0   Social History: Reviewed -  reports that she has quit smoking. Her smoking use included cigarettes. She has a 3.50 pack-year smoking history. She has never used smokeless tobacco.  Objective Findings:  Vitals: Blood pressure 115/75, pulse 81, height 5\' 4"  (1.626 m), weight 153 lb 12.8 oz (69.8  kg).  PHYSICAL EXAMINATION General appearance - normal appearing weight, well hydrated, in mild to moderate distress and chronically ill appearing Mental status - alert, oriented to person, place, and time, distracted by pain holding her abdomen lightly Chest - clear to auscultation, no wheezes, rales or rhonchi, symmetric air entry Heart - normal rate and regular rhythm Abdomen - tenderness noted to deep palpation both lower quadrants no rebound tenderness bowel sounds watery throughout the abdomen, slightly increased and watery Breasts -  Skin - normal coloration and turgor, no rashes, no suspicious skin lesions noted  PELVIC External genitalia -normal Vulva -normal Vagina -normal secretions Cervix -small unremarkable other than on ultrasound there is several nabothian cysts inside the cervix.  These could cause some local discomfort Uterus -pelvic ultrasound shows a retroverted uterus with a thin endometrium patient does not describe ultrasound probe contact with the uterus as increasing the discomfort in any way she just describes her lower abdomen is "just hurts" Adnexa -no masses [place] -not done Rectal -not done, Hemoccult was positive    Assessment & Plan:   A:  1.  Chronic lower abdominal pain 2. Positive Hemoccult  P:  1.  Urgent referral to [facility] GI discussed with [place] [facility] 2. Vicodin 5 x 15 tabs prn pain 3. Trial Bentyl 10 mg x 10 tabs.

## 2018-03-05 ENCOUNTER — Other Ambulatory Visit: Payer: Self-pay | Admitting: Obstetrics and Gynecology

## 2018-03-05 ENCOUNTER — Telehealth: Payer: Self-pay | Admitting: Obstetrics and Gynecology

## 2018-03-05 MED ORDER — OXYCODONE-ACETAMINOPHEN 5-325 MG PO TABS: 1.0000 | ORAL_TABLET | Freq: Four times a day (QID) | ORAL | 0 refills | Status: DC | PRN

## 2018-03-05 NOTE — Progress Notes (Signed)
Refilled percocet

## 2018-03-05 NOTE — Telephone Encounter (Signed)
Patient called stating she spoke to Dr Glo Herring regarding her pain and he was going to send in [employer].  Will remind him to send in.

## 2018-03-05 NOTE — Telephone Encounter (Signed)
Percocet 5 x 30 tabs, sent via e-scribe.

## 2018-03-05 NOTE — Telephone Encounter (Signed)
Valerie Huber called stating that Dr. Glo Herring was suppose to send her Percocet to her pharmacy. Pharmacy states that the never received it. Pt also would like to let Dr. Glo Herring know that she has an appointment with the GI on 11/25 @ 10:00am

## 2018-03-17 ENCOUNTER — Emergency Department (HOSPITAL_COMMUNITY)
Admission: EM | Admit: 2018-03-17 | Discharge: 2018-03-17 | Disposition: A | Discharge status: Home / Self Care | Payer: Medicaid Other | Attending: Emergency Medicine | Admitting: Emergency Medicine

## 2018-03-17 ENCOUNTER — Other Ambulatory Visit: Payer: Self-pay

## 2018-03-17 ENCOUNTER — Encounter (HOSPITAL_COMMUNITY): Payer: Self-pay | Admitting: Emergency Medicine

## 2018-03-17 DIAGNOSIS — T7840XA Allergy, unspecified, initial encounter: Secondary | ICD-10-CM | POA: Insufficient documentation

## 2018-03-17 DIAGNOSIS — J45909 Unspecified asthma, uncomplicated: Secondary | ICD-10-CM | POA: Diagnosis not present

## 2018-03-17 DIAGNOSIS — Z87891 Personal history of nicotine dependence: Secondary | ICD-10-CM | POA: Diagnosis not present

## 2018-03-17 DIAGNOSIS — R600 Localized edema: Secondary | ICD-10-CM | POA: Diagnosis present

## 2018-03-17 MED ORDER — DIPHENHYDRAMINE HCL 50 MG/ML IJ SOLN
50.0000 mg | Freq: Once | INTRAMUSCULAR | Status: AC
  Administered 2018-03-17: 50 mg via INTRAVENOUS
  Filled 2018-03-17: qty 1

## 2018-03-17 MED ORDER — PREDNISONE 20 MG PO TABS: 40.0000 mg | ORAL_TABLET | Freq: Every day | ORAL | 0 refills | Status: DC

## 2018-03-17 MED ORDER — METHYLPREDNISOLONE SODIUM SUCC 125 MG IJ SOLR
125.0000 mg | Freq: Once | INTRAMUSCULAR | Status: AC
  Administered 2018-03-17: 125 mg via INTRAVENOUS
  Filled 2018-03-17: qty 2

## 2018-03-17 MED ORDER — FAMOTIDINE IN NACL 20-0.9 MG/50ML-% IV SOLN
20.0000 mg | Freq: Once | INTRAVENOUS | Status: AC
  Administered 2018-03-17: 20 mg via INTRAVENOUS
  Filled 2018-03-17: qty 50

## 2018-03-17 MED ORDER — SODIUM CHLORIDE 0.9 % IV SOLN
INTRAVENOUS | Status: DC | PRN
  Administered 2018-03-17: 19:00:00 via INTRAVENOUS

## 2018-03-17 NOTE — ED Triage Notes (Signed)
Patient states she is allergic to Ibuprofen, took one pill, 600 mg per her report, by accident. Patient states she has edema in her throat and burning. No airway obstruction, lung sounds clear and without wheeze or stridor.

## 2018-03-17 NOTE — Discharge Instructions (Addendum)
Take the prescription as directed.  Take over the counter benadryl, as directed on packaging, as needed for itching.  If the benadryl is too sedating, take an over the counter non-sedating antihistamine such as claritin, allegra or zyrtec, as directed on packaging.  Call your regular medical doctor tomorrow to schedule a follow up appointment within the next 2 to 3 days.  Return to the Emergency Department immediately sooner if worsening.

## 2018-03-17 NOTE — ED Provider Notes (Signed)
[facility] EMERGENCY DEPARTMENT Provider Note   CSN: [id number] Arrival date & time: 03/17/18  [place]     History   Chief Complaint Chief Complaint  Patient presents with  . Allergic Reaction    HPI Valerie Huber is a 32 y.o. female.  HPI  Pt was seen at 1850. Per pt, c/o sudden onset and persistence of constant sensation of "burning" and "swelling" in her bilat facial cheeks that began after she accidentally took an ibuprofen PTA. Pt states she is allergic to ibuprofen. Denies CP/palpitations, no SOB/cough, no abd pain, no N/V/D, no dysphagia, no stridor/wheezing, no fever, no rash.   Past Medical History:  Diagnosis Date  . Asthma   . Endometriosis   . Irregular periods 12/31/2014  . Pelvic pain in female 12/31/2014  . Pregnant 04/14/2015  . Second degree uterine prolapse 12/31/2014  . Seizures ([place])   . Vaginal Pap smear, abnormal     Patient Active Problem List   Diagnosis Date Noted  . Diarrhea of presumed infectious origin   . Rectal bleeding   . Poor appetite   . Pelvic pain 02/20/2018  . Bilateral lower abdominal pain 02/20/2018  . Encounter for counseling regarding contraception 09/02/2017  . Complex regional pain syndrome type 1 of right lower extremity 05/20/2017  . 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 Active labor at term 12/02/2015  . Active labor 12/01/2015  . Decreased appetite 11/16/2015  . Insufficient prenatal care 10/25/2015  . Marijuana use 07/05/2015  . Moderate dysplasia of cervix (CIN II)  at endocervical margin of LEEP specimen 01/22/2015  . Pelvic pain in female 12/31/2014  . Endometriosis determined by laparoscopy 12/31/2014  . Second degree uterine prolapse 12/31/2014  . Irregular periods 12/31/2014  . 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 Hematemesis with nausea     Past Surgical History:  Procedure Laterality Date  . DILITATION & CURRETTAGE/HYSTROSCOPY WITH NOVASURE ABLATION N/A 10/08/2017   Procedure: DILATATION &  CURETTAGE/HYSTEROSCOPY WITH MINERVA  ENDOMETRIAL ABLATION (Procedure #2);  Surgeon: [name], MD;  Location: [facility];  Service: Gynecology;  Laterality: N/A;  . ESOPHAGOGASTRODUODENOSCOPY N/A 12/10/2014   SLF: hematoemesis due to moderate erosive gastritis, duoedentitis, and duodenal ulcer  . LEEP    . surgery for endometriosis  2005  . TUBAL LIGATION Bilateral 10/08/2017   Procedure: BILATERAL TUBAL STERILIZATION WITH FALLOPE RINGS APPLICATION (Procedure #1);  Surgeon: [name], MD;  Location: [facility];  Service: Gynecology;  Laterality: Bilateral;     OB History    Gravida  3   Para  2   Term  2   Preterm      AB  1   Living  2     SAB      TAB  1   Ectopic      Multiple  0   Live Births  2            Home Medications    Prior to Admission medications   Medication Sig Start Date End Date Taking? Authorizing Provider  dicyclomine (BENTYL) 10 MG capsule Take 1 capsule (10 mg total) by mouth 3 (three) times daily before meals. 03/04/18   [name], MD  oxyCODONE-acetaminophen (PERCOCET/ROXICET) 5-325 MG tablet Take 1 tablet by mouth every 6 (six) hours as needed for moderate pain or severe pain. 03/05/18   [name], MD  potassium chloride SA (K-DUR,KLOR-CON) 20 MEQ tablet Take 1 tablet (20 mEq total) by mouth 2 (two) times  daily. 02/27/18   [name], MD  promethazine (PHENERGAN) 25 MG tablet Take 1 tablet (25 mg total) by mouth every 6 (six) hours as needed for nausea or vomiting (nausea). 02/24/18   [name], MD    Family History Family History  Problem Relation Age of Onset  . Cancer Mother        kidney   . Asthma Mother   . Hypertension Mother   . Cancer Paternal Grandmother   . Asthma Son   . Colon cancer Neg Hx     Social History Social History   Tobacco Use  . Smoking status: Former Smoker    Packs/day: 0.25    Years: 14.00    Pack years: 3.50    Types: Cigarettes  . Smokeless tobacco: Never Used  .  Tobacco comment: QUIT SMOKING ON JULY 11th. STATES WOULD ONLY HAVE A FEW CIGARETTES A WEEK PRIOR TO THIS.   Substance Use Topics  . Alcohol use: No    Alcohol/week: 0.0 standard drinks  . Drug use: No     Allergies   Ibuprofen and Flexeril [cyclobenzaprine]   Review of Systems Review of Systems ROS: Statement: All systems negative except as marked or noted in the HPI; Constitutional: Negative for fever and chills. ; ; Eyes: Negative for eye pain, redness and discharge. ; ; ENMT: Negative for ear pain, hoarseness, nasal congestion, sinus pressure and sore throat. ; ; Cardiovascular: Negative for chest pain, palpitations, diaphoresis, dyspnea and peripheral edema. ; ; Respiratory: Negative for cough, wheezing and stridor. ; ; Gastrointestinal: Negative for nausea, vomiting, diarrhea, abdominal pain, blood in stool, hematemesis, jaundice and rectal bleeding. . ; ; Genitourinary: Negative for dysuria, flank pain and hematuria. ; ; Musculoskeletal: Negative for back pain and neck pain. Negative for swelling and trauma.; ; Skin: +"burning and swelling sensation." Negative for pruritus, rash, abrasions, blisters, bruising and skin lesion.; ; Neuro: Negative for headache, lightheadedness and neck stiffness. Negative for weakness, altered level of consciousness, altered mental status, extremity weakness, paresthesias, involuntary movement, seizure and syncope.       Physical Exam Updated Vital Signs BP (!) 142/104 (BP Location: Right Arm)   Pulse (!) 106   Temp 97.7 F (36.5 C) (Oral)   Resp (!) 24   Ht 5\' 4"  (1.626 m)   Wt 69.8 kg   SpO2 100%   BMI 26.40 kg/m   Physical Exam 1855: Physical examination:  Nursing notes reviewed; Vital signs and O2 SAT reviewed;  Constitutional: Well developed, Well nourished, Well hydrated, Tearful, hyperventilating.; Head:  Normocephalic, atraumatic. No facial edema, no rash.; Eyes: EOMI, PERRL, No scleral icterus; ENMT: Mouth and pharynx normal, Mucous  membranes moist. Mouth and pharynx without lesions. No tonsillar exudates. No intra-oral edema. No submandibular or sublingual edema. No hoarse voice, no drooling, no stridor.  No trismus.;;; Neck: Supple, Full range of motion, No lymphadenopathy; Cardiovascular: Regular rate and rhythm, No gallop; Respiratory: Breath sounds clear & equal bilaterally, No wheezes.  Speaking full sentences with ease, Normal respiratory effort/excursion. Hyperventilating at times.; Chest: Nontender, Movement normal; Abdomen: Soft, Nontender, Nondistended, Normal bowel sounds; Genitourinary: No CVA tenderness; Extremities: Peripheral pulses normal, No tenderness, No edema, No calf edema or asymmetry.; Neuro: AA&Ox3, Major CN grossly intact.  Speech clear. No gross focal motor or sensory deficits in extremities.; Skin: Color normal, Warm, Dry. No rash.; Psych:  Anxious, hyperventilating, crying.    ED Treatments / Results  Labs (all labs ordered are listed, but only abnormal results  are displayed)   EKG None  Radiology   Procedures Procedures (including critical care time)  Medications Ordered in ED Medications  famotidine (PEPCID) IVPB 20 mg premix (has no administration in time range)  0.9 %  sodium chloride infusion ( Intravenous New Bag/Given 03/17/18 1905)  diphenhydrAMINE (BENADRYL) injection 50 mg (50 mg Intravenous Given 03/17/18 1901)  methylPREDNISolone sodium succinate (SOLU-MEDROL) 125 mg/2 mL injection 125 mg (125 mg Intravenous Given 03/17/18 1901)     Initial Impression / Assessment and Plan / ED Course  I have reviewed the triage vital signs and the nursing notes.  Pertinent labs & imaging results that were available during my care of the patient were reviewed by me and considered in my medical decision making (see chart for details).  MDM Reviewed: previous chart, nursing note and vitals    2115:  Pt has been asleep most of her ED visit. Pt now awake/alert, states she feels better and  wants to go homw now. Pt has already called for her ride. Tx symptomatically at this time. Dx d/w pt.  Questions answered.  Verb understanding, agreeable to d/c home with outpt f/u.   Final Clinical Impressions(s) / ED Diagnoses   Final diagnoses:  None    ED Discharge Orders    None       Francine Graven, DO 03/22/18 0301

## 2018-03-19 ENCOUNTER — Ambulatory Visit (INDEPENDENT_AMBULATORY_CARE_PROVIDER_SITE_OTHER): Payer: Medicaid Other | Admitting: Obstetrics and Gynecology

## 2018-03-19 VITALS — BP 127/82 | HR 82 | Temp 98.9°F | Ht 64.0 in | Wt 152.0 lb

## 2018-03-19 DIAGNOSIS — R1030 Lower abdominal pain, unspecified: Secondary | ICD-10-CM | POA: Diagnosis not present

## 2018-03-19 NOTE — Progress Notes (Addendum)
Patient ID: Valerie Huber, female   DOB: 1986/01/01, 32 y.o.   MRN: [id number]   [facility] Visit  _0 @            Patient name: Valerie Huber MRN [id number]  Date of birth: 1985-12-23  CC & HPI:  Valerie Huber is a 32 y.o. female presenting today for f/u of chronic lower abdominal pain. Feels pain "worst contractions ever". Brought video footage of stool.which is Dark may have old blood, has BM 2x/ or more daily.  ROS:  ROS +chronic abdominal pain +cramping +diarrhea -fever All systems are negative except as noted in the HPI and PMH.   Pertinent History Reviewed:   Reviewed: Significant for Pain . Pt is taking tylenol, at above recommended dosing. 4 at a time. " dont work" Nor fever chills. Not helped by Pepto bismol. + cold sweats.  Lab w/u was negative for wbc elevation , and ESR is normal. Low likelihood of infection. Was seen in ED for Allergic reaction, which she attributes to ibuprofen.  Patient states she cannot take ibuprofen and is listed as an allergy Medical         Past Medical History:  Diagnosis Date  . Asthma   . Endometriosis   . Irregular periods 12/31/2014  . Pelvic pain in female 12/31/2014  . Pregnant 04/14/2015  . Second degree uterine prolapse 12/31/2014  . Seizures ([place])   . Vaginal Pap smear, abnormal                               Surgical Hx:    Past Surgical History:  Procedure Laterality Date  . DILITATION & CURRETTAGE/HYSTROSCOPY WITH NOVASURE ABLATION N/A 10/08/2017   Procedure: DILATATION & CURETTAGE/HYSTEROSCOPY WITH MINERVA  ENDOMETRIAL ABLATION (Procedure #2);  Surgeon: [name], MD;  Location: [facility];  Service: Gynecology;  Laterality: N/A;  . ESOPHAGOGASTRODUODENOSCOPY N/A 12/10/2014   SLF: hematoemesis due to moderate erosive gastritis, duoedentitis, and duodenal ulcer  . LEEP    . surgery for endometriosis  2005  . TUBAL LIGATION Bilateral 10/08/2017   Procedure: BILATERAL TUBAL STERILIZATION WITH FALLOPE RINGS  APPLICATION (Procedure #1);  Surgeon: [name], MD;  Location: [facility];  Service: Gynecology;  Laterality: Bilateral;   Medications: Reviewed & Updated - see associated section                       Current Outpatient Medications:  .  predniSONE (DELTASONE) 20 MG tablet, Take 2 tablets (40 mg total) by mouth daily., Disp: 10 tablet, Rfl: 0 .  dicyclomine (BENTYL) 10 MG capsule, Take 1 capsule (10 mg total) by mouth 3 (three) times daily before meals. (Patient not taking: Reported on 03/19/2018), Disp: 10 capsule, Rfl: 0 .  oxyCODONE-acetaminophen (PERCOCET/ROXICET) 5-325 MG tablet, Take 1 tablet by mouth every 6 (six) hours as needed for moderate pain or severe pain. (Patient not taking: Reported on 03/19/2018), Disp: 30 tablet, Rfl: 0 .  potassium chloride SA (K-DUR,KLOR-CON) 20 MEQ tablet, Take 1 tablet (20 mEq total) by mouth 2 (two) times daily. (Patient not taking: Reported on 03/19/2018), Disp: 10 tablet, Rfl: 0 .  promethazine (PHENERGAN) 25 MG tablet, Take 1 tablet (25 mg total) by mouth every 6 (six) hours as needed for nausea or vomiting (nausea). (Patient not taking: Reported on 03/19/2018), Disp: 30 tablet, Rfl: 0   Social History: Reviewed -  reports that she has quit smoking.  Her smoking use included cigarettes. She has a 3.50 pack-year smoking history. She has never used smokeless tobacco.  Objective Findings:  Vitals: Blood pressure 127/82, pulse 82, temperature 98.9 F (37.2 C), height _0  (1.626 m), weight 152 lb (68.9 kg).  PHYSICAL EXAMINATION General appearance - oriented to person, place, and time and in mild to moderate distress Mental status - alert, oriented to person, place, and time, normal mood, behavior, speech, dress, motor activity, and thought processes Abdomen - [place] Unable to distinguish gyn pain from chronic abdominal pain  PELVIC External genitalia - normal  Vagina - normal appearing vaginal secretions Uterus - retroverted, pt unable to to  maintain uterine contact poorly Cervix-anterior pt unable to to maintain uterine contact poorly Rectal - guaiac negative stool obtained good support, nothing on glove on digital rectal exam, tolerates exam very poorly    Assessment & Plan:   A:  1. Chronic abdominal pain 2. Need stool sample for C&S 3. Has GI appt in Nov c Dr Buford Dresser. 4. Overly Dramatic personality and behavior makes assessment difficult.  P:  1.  Trial Bentyl increase to  20 mg  2. Immodium AD 3.  pt to bring list of all medicines she's tried and effects.   By signing my name below, I, Valerie Huber, attest that this documentation has been prepared under the direction and in the presence of [name], MD. Electronically Signed: [place]. 03/19/18. 10:49 AM.

## 2018-04-07 ENCOUNTER — Ambulatory Visit: Payer: Medicaid Other | Admitting: Nurse Practitioner

## 2018-04-07 NOTE — Progress Notes (Deleted)
Primary Care Physician:  Raiford Simmonds., PA-C Primary Gastroenterologist:  Dr. Oneida Alar  No chief complaint on file.   HPI:   Valerie Huber is a 32 y.o. female who presents on referral from primary care for heme positive stool.  Reviewed information provided with the referral including ***.  No history of colonoscopy in our system.  The patient had an EGD by our service 12/10/2014 which found hematemesis due to moderate erosive gastritis, duodenitis, and a duodenal ulcer.  Surgical pathology found the biopsies to be H. pylori gastritis.  The patient was treated with triple therapy.  Recommended increase Protonix to twice daily, avoid all NSAIDs, follow-up in the office in 6 months.  The patient was a no-show to her follow-up appointment.  Today she states  Past Medical History:  Diagnosis Date  . Asthma   . Endometriosis   . Irregular periods 12/31/2014  . Pelvic pain in female 12/31/2014  . Pregnant 04/14/2015  . Second degree uterine prolapse 12/31/2014  . Seizures ([place])   . Vaginal Pap smear, abnormal     Past Surgical History:  Procedure Laterality Date  . DILITATION & CURRETTAGE/HYSTROSCOPY WITH NOVASURE ABLATION N/A 10/08/2017   Procedure: DILATATION & CURETTAGE/HYSTEROSCOPY WITH MINERVA  ENDOMETRIAL ABLATION (Procedure #2);  Surgeon: [name], MD;  Location: [facility];  Service: Gynecology;  Laterality: N/A;  . ESOPHAGOGASTRODUODENOSCOPY N/A 12/10/2014   SLF: hematoemesis due to moderate erosive gastritis, duoedentitis, and duodenal ulcer  . LEEP    . surgery for endometriosis  2005  . TUBAL LIGATION Bilateral 10/08/2017   Procedure: BILATERAL TUBAL STERILIZATION WITH FALLOPE RINGS APPLICATION (Procedure #1);  Surgeon: [name], MD;  Location: [facility];  Service: Gynecology;  Laterality: Bilateral;    Current Outpatient Medications  Medication Sig Dispense Refill  . dicyclomine (BENTYL) 10 MG capsule Take 1 capsule (10 mg total) by mouth 3 (three) times  daily before meals. (Patient not taking: Reported on 03/19/2018) 10 capsule 0  . oxyCODONE-acetaminophen (PERCOCET/ROXICET) 5-325 MG tablet Take 1 tablet by mouth every 6 (six) hours as needed for moderate pain or severe pain. (Patient not taking: Reported on 03/19/2018) 30 tablet 0  . potassium chloride SA (K-DUR,KLOR-CON) 20 MEQ tablet Take 1 tablet (20 mEq total) by mouth 2 (two) times daily. (Patient not taking: Reported on 03/19/2018) 10 tablet 0  . predniSONE (DELTASONE) 20 MG tablet Take 2 tablets (40 mg total) by mouth daily. 10 tablet 0  . promethazine (PHENERGAN) 25 MG tablet Take 1 tablet (25 mg total) by mouth every 6 (six) hours as needed for nausea or vomiting (nausea). (Patient not taking: Reported on 03/19/2018) 30 tablet 0   No current facility-administered medications for this visit.     Allergies as of 04/07/2018 - Review Complete 03/19/2018  Allergen Reaction Noted  . Ibuprofen Anaphylaxis 01/01/2014  . Flexeril [cyclobenzaprine] Hives 12/25/2012    Family History  Problem Relation Age of Onset  . Cancer Mother        kidney   . Asthma Mother   . Hypertension Mother   . Cancer Paternal Grandmother   . Asthma Son   . Colon cancer Neg Hx     Social History   Socioeconomic History  . Marital status: Single    Spouse name: Not on file  . Number of children: Not on file  . Years of education: Not on file  . Highest education level: Not on file  Occupational History  . Occupation: Housecleaning  Social Needs  .  Financial resource strain: Not on file  . Food insecurity:    Worry: Not on file    Inability: Not on file  . Transportation needs:    Medical: Not on file    Non-medical: Not on file  Tobacco Use  . Smoking status: Former Smoker    Packs/day: 0.25    Years: 14.00    Pack years: 3.50    Types: Cigarettes  . Smokeless tobacco: Never Used  . Tobacco comment: QUIT SMOKING ON JULY 11th. STATES WOULD ONLY HAVE A FEW CIGARETTES A WEEK PRIOR TO THIS.     Substance and Sexual Activity  . Alcohol use: No    Alcohol/week: 0.0 standard drinks  . Drug use: No  . Sexual activity: Not Currently    Birth control/protection: Surgical    Comment: tubal and ablation  Lifestyle  . Physical activity:    Days per week: Not on file    Minutes per session: Not on file  . Stress: Not on file  Relationships  . Social connections:    Talks on phone: Not on file    Gets together: Not on file    Attends religious service: Not on file    Active member of club or organization: Not on file    Attends meetings of clubs or organizations: Not on file    Relationship status: Not on file  . Intimate partner violence:    Fear of current or ex partner: Not on file    Emotionally abused: Not on file    Physically abused: Not on file    Forced sexual activity: Not on file  Other Topics Concern  . Not on file  Social History Narrative  . Not on file    Review of Systems: Complete ROS negative except as per HPI.    Physical Exam: There were no vitals taken for this visit. General:   Alert and oriented. Pleasant and cooperative. Well-nourished and well-developed.  Head:  Normocephalic and atraumatic. Eyes:  Without icterus, sclera clear and conjunctiva pink.  Ears:  Normal auditory acuity. Mouth:  No deformity or lesions, oral mucosa pink.  Throat/Neck:  Supple, without mass or thyromegaly. Cardiovascular:  S1, S2 present without murmurs appreciated. Normal pulses noted. Extremities without clubbing or edema. Respiratory:  Clear to auscultation bilaterally. No wheezes, rales, or rhonchi. No distress.  Gastrointestinal:  +BS, soft, non-tender and non-distended. No HSM noted. No guarding or rebound. No masses appreciated.  Rectal:  Deferred  Musculoskalatal:  Symmetrical without gross deformities. Normal posture. Skin:  Intact without significant lesions or rashes. Neurologic:  Alert and oriented x4;  grossly normal neurologically. Psych:  Alert and  cooperative. Normal mood and affect. Heme/Lymph/Immune: No significant cervical adenopathy. No excessive bruising noted.    04/07/2018 9:10 AM   Disclaimer: This note was dictated with voice recognition software. Similar sounding words can inadvertently be transcribed and may not be corrected upon review.

## 2018-04-16 ENCOUNTER — Ambulatory Visit: Payer: Medicaid Other | Admitting: Obstetrics and Gynecology

## 2018-04-22 ENCOUNTER — Ambulatory Visit: Payer: Medicaid Other | Admitting: Obstetrics and Gynecology

## 2018-04-23 ENCOUNTER — Ambulatory Visit: Payer: Medicaid Other | Admitting: Nurse Practitioner

## 2018-04-23 NOTE — Progress Notes (Deleted)
Referring Provider: [name], MD Primary Care Physician:  Raiford Simmonds., PA-C Primary GI:  Dr. Oneida Alar  No chief complaint on file.   HPI:   Valerie Huber is a 32 y.o. female who presents for heme positive stool.  The patient is not been seen by our office since 12/08/2014 when she was seen for hematemesis with nausea.  It was deemed not in the setting of GERD symptoms and no NSAIDs, query Mallory-Weiss tear, esophagitis, gastritis/PUD.  Recommended EGD.  This was completed 12/10/2014 and found moderate erosive gastritis, duodenitis, and duodenal ulcer.  Recommended increase Protonix twice daily, avoid all NSAIDs.  Surgical pathology found the biopsies to be gastritis positive for H. pylori.  She was treated with triple therapy.  The patient was a no-show to follow-up scheduled 06/13/2015.  The patient was seen by gynecology 03/04/2018 for diarrhea and Hemoccult screening.  Noted ongoing moderate to severe lower abdominal pain from the umbilicus to the pubis symphysis and from hip to hip.  No fevers, noted loose stools with 3 bowel movements or more a day.  Noted dark stools and Hemoccult was positive.  Her surgery was canceled (she was scheduled for laparoscopic-assisted hysterectomy).  She was referred to GI.  No history of colonoscopy in our system.  Today she states   Past Medical History:  Diagnosis Date  . Asthma   . Endometriosis   . Irregular periods 12/31/2014  . Pelvic pain in female 12/31/2014  . Pregnant 04/14/2015  . Second degree uterine prolapse 12/31/2014  . Seizures ([place])   . Vaginal Pap smear, abnormal     Past Surgical History:  Procedure Laterality Date  . DILITATION & CURRETTAGE/HYSTROSCOPY WITH NOVASURE ABLATION N/A 10/08/2017   Procedure: DILATATION & CURETTAGE/HYSTEROSCOPY WITH MINERVA  ENDOMETRIAL ABLATION (Procedure #2);  Surgeon: [name], MD;  Location: [facility];  Service: Gynecology;  Laterality: N/A;  . ESOPHAGOGASTRODUODENOSCOPY N/A  12/10/2014   SLF: hematoemesis due to moderate erosive gastritis, duoedentitis, and duodenal ulcer  . LEEP    . surgery for endometriosis  2005  . TUBAL LIGATION Bilateral 10/08/2017   Procedure: BILATERAL TUBAL STERILIZATION WITH FALLOPE RINGS APPLICATION (Procedure #1);  Surgeon: [name], MD;  Location: [facility];  Service: Gynecology;  Laterality: Bilateral;    Current Outpatient Medications  Medication Sig Dispense Refill  . dicyclomine (BENTYL) 10 MG capsule Take 1 capsule (10 mg total) by mouth 3 (three) times daily before meals. (Patient not taking: Reported on 03/19/2018) 10 capsule 0  . oxyCODONE-acetaminophen (PERCOCET/ROXICET) 5-325 MG tablet Take 1 tablet by mouth every 6 (six) hours as needed for moderate pain or severe pain. (Patient not taking: Reported on 03/19/2018) 30 tablet 0  . potassium chloride SA (K-DUR,KLOR-CON) 20 MEQ tablet Take 1 tablet (20 mEq total) by mouth 2 (two) times daily. (Patient not taking: Reported on 03/19/2018) 10 tablet 0  . predniSONE (DELTASONE) 20 MG tablet Take 2 tablets (40 mg total) by mouth daily. 10 tablet 0  . promethazine (PHENERGAN) 25 MG tablet Take 1 tablet (25 mg total) by mouth every 6 (six) hours as needed for nausea or vomiting (nausea). (Patient not taking: Reported on 03/19/2018) 30 tablet 0   No current facility-administered medications for this visit.     Allergies as of 04/23/2018 - Review Complete 03/19/2018  Allergen Reaction Noted  . Ibuprofen Anaphylaxis 01/01/2014  . Flexeril [cyclobenzaprine] Hives 12/25/2012    Family History  Problem Relation Age of Onset  . Cancer Mother  kidney   . Asthma Mother   . Hypertension Mother   . Cancer Paternal Grandmother   . Asthma Son   . Colon cancer Neg Hx     Social History   Socioeconomic History  . Marital status: Single    Spouse name: Not on file  . Number of children: Not on file  . Years of education: Not on file  . Highest education level: Not on file    Occupational History  . Occupation: Housecleaning  Social Needs  . Financial resource strain: Not on file  . Food insecurity:    Worry: Not on file    Inability: Not on file  . Transportation needs:    Medical: Not on file    Non-medical: Not on file  Tobacco Use  . Smoking status: Former Smoker    Packs/day: 0.25    Years: 14.00    Pack years: 3.50    Types: Cigarettes  . Smokeless tobacco: Never Used  . Tobacco comment: QUIT SMOKING ON JULY 11th. STATES WOULD ONLY HAVE A FEW CIGARETTES A WEEK PRIOR TO THIS.   Substance and Sexual Activity  . Alcohol use: No    Alcohol/week: 0.0 standard drinks  . Drug use: No  . Sexual activity: Not Currently    Birth control/protection: Surgical    Comment: tubal and ablation  Lifestyle  . Physical activity:    Days per week: Not on file    Minutes per session: Not on file  . Stress: Not on file  Relationships  . Social connections:    Talks on phone: Not on file    Gets together: Not on file    Attends religious service: Not on file    Active member of club or organization: Not on file    Attends meetings of clubs or organizations: Not on file    Relationship status: Not on file  Other Topics Concern  . Not on file  Social History Narrative  . Not on file    Review of Systems: Complete ROS negative except as per HPI.   Physical Exam: There were no vitals taken for this visit. General:   Alert and oriented. Pleasant and cooperative. Well-nourished and well-developed.  Head:  Normocephalic and atraumatic. Eyes:  Without icterus, sclera clear and conjunctiva pink.  Ears:  Normal auditory acuity. Mouth:  No deformity or lesions, oral mucosa pink.  Throat/Neck:  Supple, without mass or thyromegaly. Cardiovascular:  S1, S2 present without murmurs appreciated. Normal pulses noted. Extremities without clubbing or edema. Respiratory:  Clear to auscultation bilaterally. No wheezes, rales, or rhonchi. No distress.   Gastrointestinal:  +BS, soft, non-tender and non-distended. No HSM noted. No guarding or rebound. No masses appreciated.  Rectal:  Deferred  Musculoskalatal:  Symmetrical without gross deformities. Normal posture. Skin:  Intact without significant lesions or rashes. Neurologic:  Alert and oriented x4;  grossly normal neurologically. Psych:  Alert and cooperative. Normal mood and affect. Heme/Lymph/Immune: No significant cervical adenopathy. No excessive bruising noted.    04/23/2018 1:45 PM   Disclaimer: This note was dictated with voice recognition software. Similar sounding words can inadvertently be transcribed and may not be corrected upon review.

## 2018-07-16 ENCOUNTER — Other Ambulatory Visit: Payer: Self-pay

## 2018-07-16 ENCOUNTER — Emergency Department (HOSPITAL_COMMUNITY)
Admission: EM | Admit: 2018-07-16 | Discharge: 2018-07-16 | Disposition: A | Discharge status: Home / Self Care | Payer: Medicaid Other | Attending: Emergency Medicine | Admitting: Emergency Medicine

## 2018-07-16 ENCOUNTER — Encounter (HOSPITAL_COMMUNITY): Payer: Self-pay

## 2018-07-16 ENCOUNTER — Emergency Department (HOSPITAL_COMMUNITY): Payer: Medicaid Other

## 2018-07-16 DIAGNOSIS — R05 Cough: Secondary | ICD-10-CM | POA: Diagnosis not present

## 2018-07-16 DIAGNOSIS — R0602 Shortness of breath: Secondary | ICD-10-CM | POA: Diagnosis present

## 2018-07-16 DIAGNOSIS — R69 Illness, unspecified: Secondary | ICD-10-CM

## 2018-07-16 DIAGNOSIS — J40 Bronchitis, not specified as acute or chronic: Secondary | ICD-10-CM | POA: Insufficient documentation

## 2018-07-16 DIAGNOSIS — J45909 Unspecified asthma, uncomplicated: Secondary | ICD-10-CM | POA: Insufficient documentation

## 2018-07-16 DIAGNOSIS — J111 Influenza due to unidentified influenza virus with other respiratory manifestations: Secondary | ICD-10-CM | POA: Diagnosis not present

## 2018-07-16 MED ORDER — HYDROCODONE-ACETAMINOPHEN 5-325 MG PO TABS
1.0000 | ORAL_TABLET | Freq: Once | ORAL | Status: AC
  Administered 2018-07-16: 1 via ORAL
  Filled 2018-07-16: qty 1

## 2018-07-16 MED ORDER — GUAIFENESIN-DM 100-10 MG/5ML PO SYRP: 5.0000 mL | ORAL_SOLUTION | ORAL | 0 refills | Status: DC | PRN

## 2018-07-16 NOTE — ED Triage Notes (Signed)
Pt reports cough, fever and body aches for 3 days

## 2018-07-16 NOTE — ED Notes (Signed)
ED Provider at bedside. 

## 2018-07-16 NOTE — ED Notes (Signed)
Family at bedside. 

## 2018-07-16 NOTE — ED Notes (Signed)
Patient transported to X-ray 

## 2018-07-16 NOTE — ED Provider Notes (Signed)
[facility] EMERGENCY DEPARTMENT Provider Note   CSN: [id number] Arrival date & time: 07/16/18  8546    History   Chief Complaint Chief Complaint  Patient presents with  . Cough    HPI Valerie Huber is a 33 y.o. female.     HPI Patient presents with shortness of breath and cough.  Has had body aches to.  States she is felt bad for 3 days.  Patient denies history of asthma but states she does wheeze when she gets an infection.  Has muscle aches diffusely.  Patient's 44-year-old son has had URI symptoms also.  Mild sore throat.  Mild sputum production. Past Medical History:  Diagnosis Date  . Asthma   . Endometriosis   . Irregular periods 12/31/2014  . Pelvic pain in female 12/31/2014  . Pregnant 04/14/2015  . Second degree uterine prolapse 12/31/2014  . Seizures ([place])   . Vaginal Pap smear, abnormal     Patient Active Problem List   Diagnosis Date Noted  . Diarrhea of presumed infectious origin   . Rectal bleeding   . Poor appetite   . Pelvic pain 02/20/2018  . Bilateral lower abdominal pain 02/20/2018  . Encounter for counseling regarding contraception 09/02/2017  . Complex regional pain syndrome type 1 of right lower extremity 05/20/2017  . 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 Active labor at term 12/02/2015  . Active labor 12/01/2015  . Decreased appetite 11/16/2015  . Insufficient prenatal care 10/25/2015  . Marijuana use 07/05/2015  . Moderate dysplasia of cervix (CIN II)  at endocervical margin of LEEP specimen 01/22/2015  . Pelvic pain in female 12/31/2014  . Endometriosis determined by laparoscopy 12/31/2014  . Second degree uterine prolapse 12/31/2014  . Irregular periods 12/31/2014  . 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 Hematemesis with nausea     Past Surgical History:  Procedure Laterality Date  . DILITATION & CURRETTAGE/HYSTROSCOPY WITH NOVASURE ABLATION N/A 10/08/2017   Procedure: DILATATION & CURETTAGE/HYSTEROSCOPY WITH MINERVA   ENDOMETRIAL ABLATION (Procedure #2);  Surgeon: [name], MD;  Location: [facility];  Service: Gynecology;  Laterality: N/A;  . ESOPHAGOGASTRODUODENOSCOPY N/A 12/10/2014   SLF: hematoemesis due to moderate erosive gastritis, duoedentitis, and duodenal ulcer  . LEEP    . surgery for endometriosis  2005  . TUBAL LIGATION Bilateral 10/08/2017   Procedure: BILATERAL TUBAL STERILIZATION WITH FALLOPE RINGS APPLICATION (Procedure #1);  Surgeon: [name], MD;  Location: [facility];  Service: Gynecology;  Laterality: Bilateral;     OB History    Gravida  3   Para  2   Term  2   Preterm      AB  1   Living  2     SAB      TAB  1   Ectopic      Multiple  0   Live Births  2            Home Medications    Prior to Admission medications   Medication Sig Start Date End Date Taking? Authorizing Provider  acetaminophen (TYLENOL) 500 MG tablet Take 1,500 mg by mouth every 6 (six) hours as needed.   Yes [provider]  Chlorphen-Pseudoephed-APAP (THERAFLU FLU/COLD PO) Take 30 mLs by mouth 2 (two) times daily.   Yes [provider]  dicyclomine (BENTYL) 10 MG capsule Take 1 capsule (10 mg total) by mouth 3 (three) times daily before meals. Patient not taking: Reported on 03/19/2018 03/04/18   Mallory Shirk  V, MD  guaiFENesin-dextromethorphan (ROBITUSSIN DM) 100-10 MG/5ML syrup Take 5 mLs by mouth every 4 (four) hours as needed for cough. 07/16/18   Davonna Belling, MD  oxyCODONE-acetaminophen (PERCOCET/ROXICET) 5-325 MG tablet Take 1 tablet by mouth every 6 (six) hours as needed for moderate pain or severe pain. Patient not taking: Reported on 03/19/2018 03/05/18   [name], MD  potassium chloride SA (K-DUR,KLOR-CON) 20 MEQ tablet Take 1 tablet (20 mEq total) by mouth 2 (two) times daily. Patient not taking: Reported on 03/19/2018 02/27/18   [name], MD  predniSONE (DELTASONE) 20 MG tablet Take 2 tablets (40 mg total) by mouth daily. Patient not  taking: Reported on 07/16/2018 03/17/18   Francine Graven, DO  promethazine (PHENERGAN) 25 MG tablet Take 1 tablet (25 mg total) by mouth every 6 (six) hours as needed for nausea or vomiting (nausea). Patient not taking: Reported on 03/19/2018 02/24/18   [name], MD    Family History Family History  Problem Relation Age of Onset  . Cancer Mother        kidney   . Asthma Mother   . Hypertension Mother   . Cancer Paternal Grandmother   . Asthma Son   . Colon cancer Neg Hx     Social History Social History   Tobacco Use  . Smoking status: Former Smoker    Packs/day: 0.25    Years: 14.00    Pack years: 3.50    Types: Cigarettes  . Smokeless tobacco: Never Used  . Tobacco comment: QUIT SMOKING ON JULY 11th. STATES WOULD ONLY HAVE A FEW CIGARETTES A WEEK PRIOR TO THIS.   Substance Use Topics  . Alcohol use: No    Alcohol/week: 0.0 standard drinks  . Drug use: No     Allergies   Ibuprofen and Flexeril [cyclobenzaprine]   Review of Systems Review of Systems  Constitutional: Positive for appetite change and chills.  HENT: Positive for congestion. Negative for sore throat.   Respiratory: Positive for cough and shortness of breath.   Cardiovascular: Positive for chest pain.  Gastrointestinal: Negative for abdominal pain.  Genitourinary: Negative for flank pain.  Musculoskeletal: Positive for myalgias.  Skin: Negative for rash.  Neurological: Negative for weakness.  Psychiatric/Behavioral: Negative for confusion.     Physical Exam Updated Vital Signs BP 111/69 (BP Location: Right Arm)   Pulse 87   Temp 98.5 F (36.9 C) (Oral)   Resp 16   Ht 5\' 4"  (1.626 m)   SpO2 98%   BMI 26.09 kg/m   Physical Exam Vitals signs and nursing note reviewed.  HENT:     Head: Normocephalic.     Mouth/Throat:     Pharynx: No oropharyngeal exudate.  Neck:     Musculoskeletal: Neck supple.  Cardiovascular:     Rate and Rhythm: Regular rhythm.  Pulmonary:     Comments:  Mildly harsh breath sounds.  No focal rales or rhonchi. Abdominal:     Tenderness: There is no abdominal tenderness.  Musculoskeletal:     Right lower leg: No edema.     Left lower leg: No edema.  Skin:    General: Skin is warm.     Capillary Refill: Capillary refill takes less than 2 seconds.  Neurological:     General: No focal deficit present.     Mental Status: She is alert.      ED Treatments / Results  Labs (all labs ordered are listed, but only abnormal results are displayed)  Labs Reviewed - No data to display  EKG None  Radiology Dg Chest 2 View  Result Date: 07/16/2018 CLINICAL DATA:  Cough and fever. EXAM: CHEST - 2 VIEW COMPARISON:  08/24/2017 FINDINGS: The heart size and mediastinal contours are within normal limits. Both lungs are clear except for minimal peribronchial thickening. The visualized skeletal structures are unremarkable. IMPRESSION: Slight bronchitic changes. Electronically Signed   By: Lorriane Shire M.D.   On: 07/16/2018 11:04    Procedures Procedures (including critical care time)  Medications Ordered in ED Medications  HYDROcodone-acetaminophen (NORCO/VICODIN) 5-325 MG per tablet 1 tablet (1 tablet Oral Given 07/16/18 1207)     Initial Impression / Assessment and Plan / ED Course  I have reviewed the triage vital signs and the nursing notes.  Pertinent labs & imaging results that were available during my care of the patient were reviewed by me and considered in my medical decision making (see chart for details).        Patient with URI type symptoms.  Potentially could be flu with myalgias.  However she is 3 days in and I think is a good Tamiflu candidate.  Discussed with patient however.  She potentially could be higher risk if she has asthma, but patient states does not actually have asthma she only tends to wheeze when she gets an infection.  No current wheezing.  Discharge home.  Final Clinical Impressions(s) / ED Diagnoses   Final  diagnoses:  Influenza-like illness    ED Discharge Orders         Ordered    guaiFENesin-dextromethorphan (ROBITUSSIN DM) 100-10 MG/5ML syrup  Every 4 hours PRN     07/16/18 1233           Davonna Belling, MD 07/16/18 1235

## 2018-09-05 DIAGNOSIS — F329 Major depressive disorder, single episode, unspecified: Secondary | ICD-10-CM | POA: Diagnosis not present

## 2018-09-16 DIAGNOSIS — F329 Major depressive disorder, single episode, unspecified: Secondary | ICD-10-CM | POA: Diagnosis not present

## 2018-09-16 DIAGNOSIS — F439 Reaction to severe stress, unspecified: Secondary | ICD-10-CM | POA: Diagnosis not present

## 2018-10-10 DIAGNOSIS — F329 Major depressive disorder, single episode, unspecified: Secondary | ICD-10-CM | POA: Diagnosis not present

## 2018-12-10 DIAGNOSIS — F439 Reaction to severe stress, unspecified: Secondary | ICD-10-CM | POA: Diagnosis not present

## 2018-12-31 DIAGNOSIS — F439 Reaction to severe stress, unspecified: Secondary | ICD-10-CM | POA: Diagnosis not present

## 2019-01-26 DIAGNOSIS — F439 Reaction to severe stress, unspecified: Secondary | ICD-10-CM | POA: Diagnosis not present

## 2019-03-04 IMAGING — DX DG CHEST 2V
2 series · 2 of 2 positions shown · non-contrast
Comparison: 08/02/2017

CLINICAL DATA: Cough with sore throat.

EXAM:
CHEST - 2 VIEW

[chest pa]
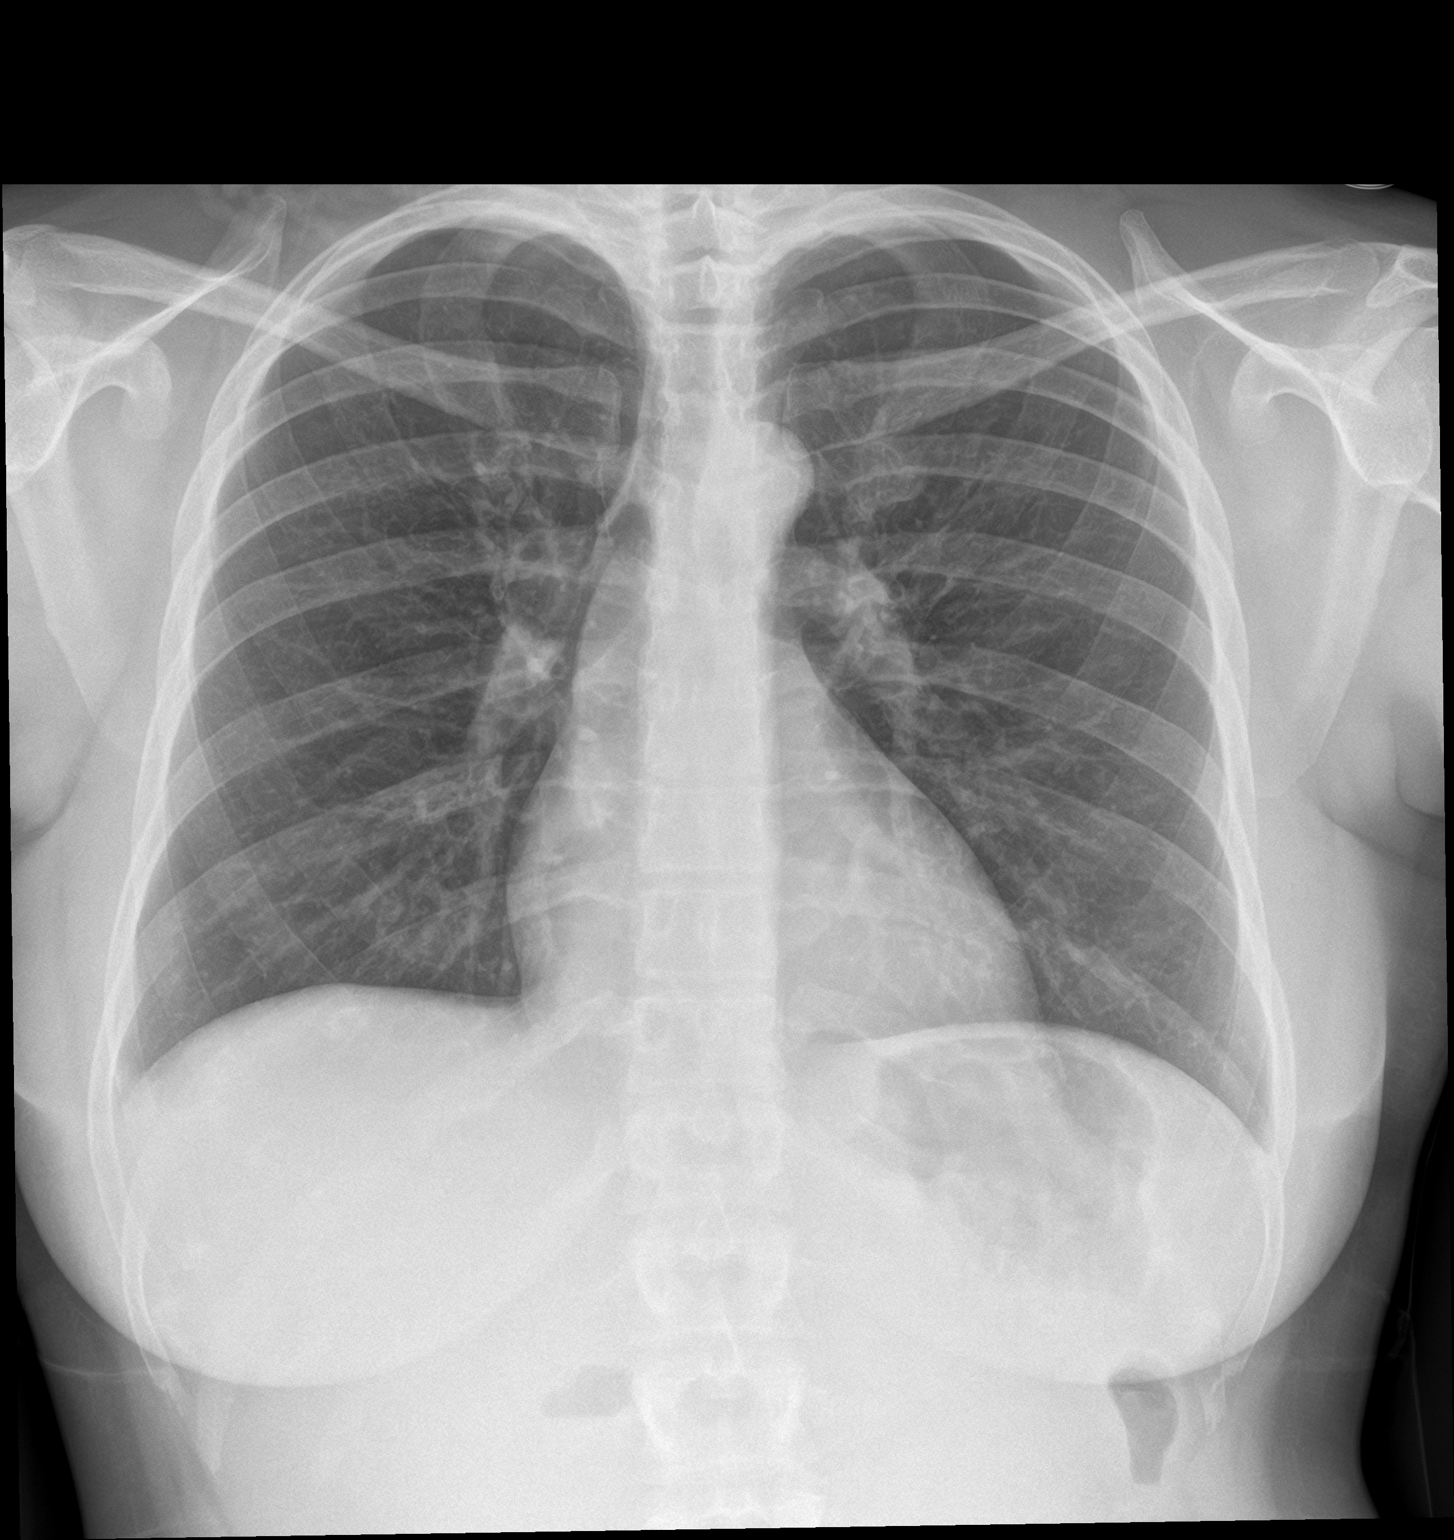

[chest lat]
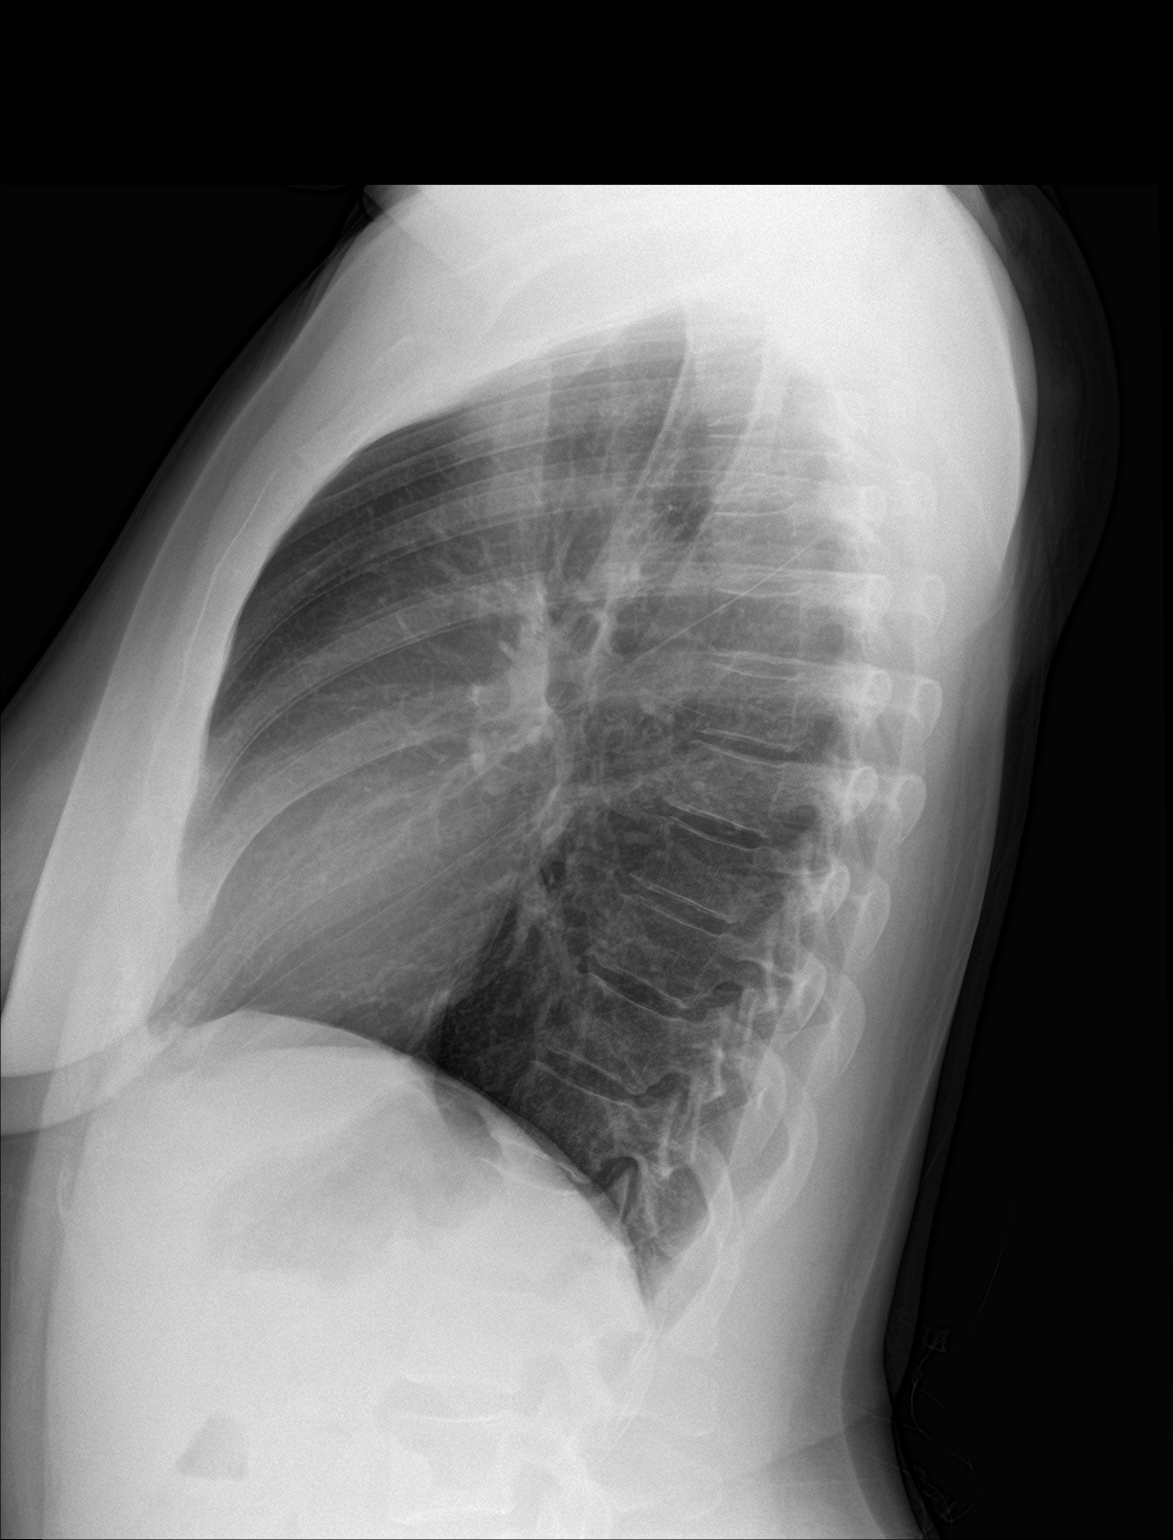

[2 of 2 positions shown; findings below may reference images not displayed]

FINDINGS: The heart size and mediastinal contours are within normal limits.
Both lungs are clear. The visualized skeletal structures are
unremarkable.
IMPRESSION: No active cardiopulmonary disease.

## 2019-06-03 ENCOUNTER — Encounter (HOSPITAL_COMMUNITY): Payer: Self-pay

## 2019-06-03 ENCOUNTER — Emergency Department (HOSPITAL_COMMUNITY): Payer: Medicaid Other

## 2019-06-03 ENCOUNTER — Other Ambulatory Visit: Payer: Self-pay

## 2019-06-03 ENCOUNTER — Emergency Department (HOSPITAL_COMMUNITY)
Admission: EM | Admit: 2019-06-03 | Discharge: 2019-06-03 | Disposition: A | Discharge status: Home / Self Care | Payer: Medicaid Other | Attending: Emergency Medicine | Admitting: Emergency Medicine

## 2019-06-03 DIAGNOSIS — Z87891 Personal history of nicotine dependence: Secondary | ICD-10-CM | POA: Diagnosis not present

## 2019-06-03 DIAGNOSIS — R1031 Right lower quadrant pain: Secondary | ICD-10-CM | POA: Diagnosis not present

## 2019-06-03 DIAGNOSIS — Z79899 Other long term (current) drug therapy: Secondary | ICD-10-CM | POA: Diagnosis not present

## 2019-06-03 DIAGNOSIS — K429 Umbilical hernia without obstruction or gangrene: Secondary | ICD-10-CM | POA: Diagnosis not present

## 2019-06-03 DIAGNOSIS — J45909 Unspecified asthma, uncomplicated: Secondary | ICD-10-CM | POA: Diagnosis not present

## 2019-06-03 LAB — COMPREHENSIVE METABOLIC PANEL
ALT: 14 U/L (ref 0–44)
AST: 16 U/L (ref 15–41)
Albumin: 4.7 g/dL (ref 3.5–5.0)
Alkaline Phosphatase: 45 U/L (ref 38–126)
Anion gap: 13 (ref 5–15)
BUN: 12 mg/dL (ref 6–20)
CO2: 22 mmol/L (ref 22–32)
Calcium: 9.6 mg/dL (ref 8.9–10.3)
Chloride: 103 mmol/L (ref 98–111)
Creatinine, Ser: 0.65 mg/dL (ref 0.44–1.00)
GFR calc Af Amer: >60 mL/min (ref >60)
GFR calc non Af Amer: >60 mL/min (ref >60)
Glucose, Bld: 101 mg/dL — ABNORMAL HIGH (ref 70–99)
Potassium: 3.6 mmol/L (ref 3.5–5.1)
Sodium: 138 mmol/L (ref 135–145)
Total Bilirubin: 0.7 mg/dL (ref 0.3–1.2)
Total Protein: 8.2 g/dL — ABNORMAL HIGH (ref 6.5–8.1)

## 2019-06-03 LAB — CBC WITH DIFFERENTIAL/PLATELET
Abs Immature Granulocytes: 0.01 10*3/uL (ref 0.00–0.07)
Basophils Absolute: 0 10*3/uL (ref 0.0–0.1)
Basophils Relative: 0 %
Eosinophils Absolute: 0.1 10*3/uL (ref 0.0–0.5)
Eosinophils Relative: 1 %
HCT: 40.3 % (ref 36.0–46.0)
Hemoglobin: 13.4 g/dL (ref 12.0–15.0)
Immature Granulocytes: 0 %
Lymphocytes Relative: 58 %
Lymphs Abs: 3.3 10*3/uL (ref 0.7–4.0)
MCH: 31.8 pg (ref 26.0–34.0)
MCHC: 33.3 g/dL (ref 30.0–36.0)
MCV: 95.5 fL (ref 80.0–100.0)
Monocytes Absolute: 0.5 10*3/uL (ref 0.1–1.0)
Monocytes Relative: 9 %
Neutro Abs: 1.8 10*3/uL (ref 1.7–7.7)
Neutrophils Relative %: 32 %
Platelets: 296 10*3/uL (ref 150–400)
RBC: 4.22 MIL/uL (ref 3.87–5.11)
RDW: 12.2 % (ref 11.5–15.5)
WBC: 5.8 10*3/uL (ref 4.0–10.5)
nRBC: 0 % (ref 0.0–0.2)

## 2019-06-03 LAB — URINALYSIS, ROUTINE W REFLEX MICROSCOPIC
Bacteria, UA: NONE SEEN
Bilirubin Urine: NEGATIVE
Glucose, UA: NEGATIVE mg/dL
Ketones, ur: 20 mg/dL — AB
Leukocytes,Ua: NEGATIVE
Nitrite: NEGATIVE
Protein, ur: NEGATIVE mg/dL
Specific Gravity, Urine: >1.046 — ABNORMAL HIGH (ref 1.005–1.030)
pH: 6 (ref 5.0–8.0)

## 2019-06-03 LAB — LIPASE, BLOOD: Lipase: 19 U/L (ref 11–51)

## 2019-06-03 LAB — LACTIC ACID, PLASMA: Lactic Acid, Venous: 0.9 mmol/L (ref 0.5–1.9)

## 2019-06-03 LAB — HCG, QUANTITATIVE, PREGNANCY: hCG, Beta Chain, Quant, S: 1 m[IU]/mL (ref <5)

## 2019-06-03 MED ORDER — IOHEXOL 300 MG/ML  SOLN
100.0000 mL | Freq: Once | INTRAMUSCULAR | Status: AC | PRN
  Administered 2019-06-03: 16:00:00 100 mL via INTRAVENOUS

## 2019-06-03 MED ORDER — DIPHENOXYLATE-ATROPINE 2.5-0.025 MG PO TABS: 2.0000 | ORAL_TABLET | Freq: Four times a day (QID) | ORAL | 0 refills | Status: DC | PRN

## 2019-06-03 MED ORDER — MORPHINE SULFATE (PF) 4 MG/ML IV SOLN
4.0000 mg | Freq: Once | INTRAVENOUS | Status: AC
  Administered 2019-06-03: 14:00:00 4 mg via INTRAVENOUS
  Filled 2019-06-03: qty 1

## 2019-06-03 MED ORDER — FENTANYL CITRATE (PF) 100 MCG/2ML IJ SOLN
50.0000 ug | Freq: Once | INTRAMUSCULAR | Status: AC
  Administered 2019-06-03: 17:00:00 50 ug via INTRAVENOUS
  Filled 2019-06-03: qty 2

## 2019-06-03 MED ORDER — ONDANSETRON HCL 4 MG/2ML IJ SOLN
4.0000 mg | Freq: Once | INTRAMUSCULAR | Status: AC
  Administered 2019-06-03: 14:00:00 4 mg via INTRAVENOUS
  Filled 2019-06-03: qty 2

## 2019-06-03 NOTE — ED Provider Notes (Signed)
Patient signed out to me by Abigail Butts, PA-C at end of shift pending CT of the abdomen and pelvis.  Patient has 9-day history of right lower abdominal pain.  No fever or vomiting.  No flank pain or dysuria.  Laboratory studies are reassuring.  I will review results of CT scan and if CT scan is negative, she will need ultrasound to rule out ovarian torsion.   Results for orders placed or performed during the hospital encounter of 06/03/19  CBC with Differential  Result Value Ref Range   WBC 5.8 4.0 - 10.5 K/uL   RBC 4.22 3.87 - 5.11 MIL/uL   Hemoglobin 13.4 12.0 - 15.0 g/dL   HCT 40.3 36.0 - 46.0 %   MCV 95.5 80.0 - 100.0 fL   MCH 31.8 26.0 - 34.0 pg   MCHC 33.3 30.0 - 36.0 g/dL   RDW 12.2 11.5 - 15.5 %   Platelets 296 150 - 400 K/uL   nRBC 0.0 0.0 - 0.2 %   Neutrophils Relative % 32 %   Neutro Abs 1.8 1.7 - 7.7 K/uL   Lymphocytes Relative 58 %   Lymphs Abs 3.3 0.7 - 4.0 K/uL   Monocytes Relative 9 %   Monocytes Absolute 0.5 0.1 - 1.0 K/uL   Eosinophils Relative 1 %   Eosinophils Absolute 0.1 0.0 - 0.5 K/uL   Basophils Relative 0 %   Basophils Absolute 0.0 0.0 - 0.1 K/uL   Immature Granulocytes 0 %   Abs Immature Granulocytes 0.01 0.00 - 0.07 K/uL  Comprehensive metabolic panel  Result Value Ref Range   Sodium 138 135 - 145 mmol/L   Potassium 3.6 3.5 - 5.1 mmol/L   Chloride 103 98 - 111 mmol/L   CO2 22 22 - 32 mmol/L   Glucose, Bld 101 (H) 70 - 99 mg/dL   BUN 12 6 - 20 mg/dL   Creatinine, Ser 0.65 0.44 - 1.00 mg/dL   Calcium 9.6 8.9 - 10.3 mg/dL   Total Protein 8.2 (H) 6.5 - 8.1 g/dL   Albumin 4.7 3.5 - 5.0 g/dL   AST 16 15 - 41 U/L   ALT 14 0 - 44 U/L   Alkaline Phosphatase 45 38 - 126 U/L   Total Bilirubin 0.7 0.3 - 1.2 mg/dL   GFR calc non Af Amer >60 >60 mL/min   GFR calc Af Amer >60 >60 mL/min   Anion gap 13 5 - 15  Lipase, blood  Result Value Ref Range   Lipase 19 11 - 51 U/L  Urinalysis, Routine w reflex microscopic  Result Value Ref Range    Color, Urine STRAW (A) YELLOW   APPearance CLEAR CLEAR   Specific Gravity, Urine >1.046 (H) 1.005 - 1.030   pH 6.0 5.0 - 8.0   Glucose, UA NEGATIVE NEGATIVE mg/dL   Hgb urine dipstick MODERATE (A) NEGATIVE   Bilirubin Urine NEGATIVE NEGATIVE   Ketones, ur 20 (A) NEGATIVE mg/dL   Protein, ur NEGATIVE NEGATIVE mg/dL   Nitrite NEGATIVE NEGATIVE   Leukocytes,Ua NEGATIVE NEGATIVE   RBC / HPF 0-5 0 - 5 RBC/hpf   WBC, UA 0-5 0 - 5 WBC/hpf   Bacteria, UA NONE SEEN NONE SEEN   Squamous Epithelial / LPF 0-5 0 - 5   Mucus PRESENT   Lactic acid, plasma  Result Value Ref Range   Lactic Acid, Venous 0.9 0.5 - 1.9 mmol/L  hCG, quantitative, pregnancy  Result Value Ref Range   hCG, Beta Chain, Quant, S  1 <5 mIU/mL   CT ABDOMEN PELVIS W CONTRAST  Result Date: 06/03/2019 CLINICAL DATA:  34 year old with right lower quadrant abdominal pain. Suspect appendicitis. EXAM: CT ABDOMEN AND PELVIS WITH CONTRAST TECHNIQUE: Multidetector CT imaging of the abdomen and pelvis was performed using the standard protocol following bolus administration of intravenous contrast. CONTRAST:  145mL OMNIPAQUE IOHEXOL 300 MG/ML  SOLN COMPARISON:  CT 02/20/2018 FINDINGS: Lower chest: Lung bases are clear. Tiny pleural-based density in the left lower lobe on sequence 4, image 16 is stable and likely an incidental finding in a patient of this age. Hepatobiliary: Normal appearance of the liver, gallbladder and portal venous system. No biliary dilatation. Pancreas: Unremarkable. No pancreatic ductal dilatation or surrounding inflammatory changes. Spleen: Normal in size without focal abnormality. Adrenals/Urinary Tract: Normal adrenal glands. Normal appearance of both kidneys without hydronephrosis. No suspicious renal lesions. Urinary bladder is unremarkable. Stomach/Bowel: Large amount of gas in the rectum. Evidence for normal appendix without inflammatory changes. Small umbilical hernia that contains small bowel. No evidence for  obstruction or focal bowel inflammation. Vascular/Lymphatic: Normal appearance of the arterial structures. Again noted is a very large left ovarian vein. Right ovarian vein also appears to be prominent. Markedly enlarged pelvic venous structures around the uterus and adnexal structures. Findings are similar to the previous examination. No abdominopelvic lymphadenopathy. Reproductive: Uterus appears to be retroverted. Prominent low-density structure along the right side of the lower uterine segment may represent a large nabothian cyst. There is soft tissue along the posterior aspect of the uterine fundus and not clear if this is related to uterus or adjacent adnexa. Within the soft tissue, there is a central area of low density measuring up to 1.6 cm. No gross abnormality to the right ovary or right adnexa. Other: Trace fluid in the pelvis is likely physiologic. Umbilical hernia containing small bowel and fat. Musculoskeletal: No acute bone abnormality. IMPRESSION: 1. No evidence for acute appendicitis. 2. Prominent soft tissue near the uterine fundus with a central area of low density measuring up to 1.6 cm. This area is poorly characterized on CT and could represent a small ovarian/adnexal cyst or could be related to small uterine fibroid. Consider further characterization with a transvaginal pelvic ultrasound. 3. Enlarged ovarian veins and pelvic venous structures. Findings are nonspecific but can be associated with pelvic congestion syndrome. 4. Umbilical hernia containing a small amount of bowel. No evidence for bowel obstruction or bowel inflammation. Electronically Signed   By: Markus Daft M.D.   On: 06/03/2019 17:17   US PELVIC COMPLETE WITH TRANSVAGINAL  Result Date: 06/03/2019 CLINICAL DATA:  RIGHT lower quadrant abdominal pain for 10 days; past history of D&C and endometrial ablation EXAM: TRANSABDOMINAL AND TRANSVAGINAL ULTRASOUND OF PELVIS TECHNIQUE: Both transabdominal and transvaginal ultrasound  examinations of the pelvis were performed. Transabdominal technique was performed for global imaging of the pelvis including uterus, ovaries, adnexal regions, and pelvic cul-de-sac. It was necessary to proceed with endovaginal exam following the transabdominal exam to visualize the uterus, endometrium, and ovaries. COMPARISON:  02/20/2018 FINDINGS: Uterus Measurements: 8.3 x 3.8 x 4.9 cm = volume: 81 mL. Retroverted. Mildly heterogeneous myometrium. Endometrium Thickness: 2 mm. Cystic collection identified centrally at within uterus, 1.3 x 1.3 x 1.4 cm, containing a fluid fluid level and question a peripheral mural nodule 7 x 5 mm. Lesion is within versus adjacent to the fundal portion of endometrial complex. Right ovary Measurements: 2.9 x 1.8 x 2.9 cm = volume: 8.3 mL. Normal morphology without mass Left ovary Not  visualized, likely obscured by bowel gas Other findings No free pelvic fluid or adnexal masses. Nabothian cysts noted at cervix. IMPRESSION: Nonvisualization of LEFT ovary with normal appearing RIGHT ovary. 1.4 cm diameter cystic collection with debris level question related to prior D&C and endometrial ablation with focal and debris. No other pelvic sonographic abnormalities. Electronically Signed   By: Lavonia Dana M.D.   On: 06/03/2019 18:51     On recheck after additional pain medication and completion of CT and [place], pt is resting comfortably and pain appears improved.  Pt now providing additional history and stating that her current symptoms have been waxing and waning for months and accompanied with frequent, watery brown stools.  She takes Immodium AD daily.  She states that she has been to her GYN for her symptoms as well as other hospital visits, but has not been evaluated by GI for her diarrhea.  Tonight, her hgb level is reassuring and US shows a nml appearing right ovary.  There is no CT evidence of an appendicitis. While source of her RLQ pain and reported diarrhea are unclear, I feel she  is appropriate for d/c home.  Will recommend that she f/u with GI.  Return precautions also discussed.      Kem Parkinson, PA-C 06/03/19 2000    Fredia Sorrow, MD 06/05/19 [phone]

## 2019-06-03 NOTE — ED Triage Notes (Signed)
Pt has been having LRQ pain. States it has been going on for 9 days, but worse the last 3 days.

## 2019-06-03 NOTE — Discharge Instructions (Addendum)
Stop the Imodium A-D.  Call Dr. Guerry Minors office to arrange a follow-up appointment regarding your chronic diarrhea.  Return to the emergency department if you develop any worsening symptoms such as fever, vomiting, or increasing pain.

## 2019-06-03 NOTE — ED Provider Notes (Signed)
[facility] EMERGENCY DEPARTMENT Provider Note   CSN: [id number] Arrival date & time: 06/03/19  1316     History Chief Complaint  Patient presents with  . Abdominal Cramping    Valerie Huber is a 34 y.o. female with a hx of chronic pelvic pain, endometriosis, seizures presents to the Emergency Department complaining of gradual, persistent, progressively worsening right lower quadrant abdominal pain onset 9 days ago.  Patient reports that the pain was initially intermittent but became constant 3 days ago and severe in nature.  She has attempted taking an old prescription of Percocet without relief.  She reports associated diarrhea without melena or hematochezia.  She does have mild nausea without vomiting.  She also reports anorexia over the last few days.  Patient has an abdominal surgical history including bilateral tubal ligation but has not had cholecystectomy or appendectomy.  No history of ovarian torsion.  Patient reports she has not been sexually active in the last 4 months.  She reports she just completed her menstrual cycle.  She denies vaginal discharge, dysuria or hematuria.  She denies fevers, chills, congestion, vomiting, weakness, syncope.  The history is provided by the patient and medical records. No language interpreter was used.       Past Medical History:  Diagnosis Date  . Asthma   . Endometriosis   . Irregular periods 12/31/2014  . Pelvic pain in female 12/31/2014  . Pregnant 04/14/2015  . Second degree uterine prolapse 12/31/2014  . Seizures ([place])   . Vaginal Pap smear, abnormal     Patient Active Problem List   Diagnosis Date Noted  . Diarrhea of presumed infectious origin   . Rectal bleeding   . Poor appetite   . Pelvic pain 02/20/2018  . Bilateral lower abdominal pain 02/20/2018  . Encounter for counseling regarding contraception 09/02/2017  . Complex regional pain syndrome type 1 of right lower extremity 05/20/2017  . 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 Active labor at term 12/02/2015  . Active labor 12/01/2015  . Decreased appetite 11/16/2015  . Insufficient prenatal care 10/25/2015  . Marijuana use 07/05/2015  . Moderate dysplasia of cervix (CIN II)  at endocervical margin of LEEP specimen 01/22/2015  . Pelvic pain in female 12/31/2014  . Endometriosis determined by laparoscopy 12/31/2014  . Second degree uterine prolapse 12/31/2014  . Irregular periods 12/31/2014  . 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 Hematemesis with nausea     Past Surgical History:  Procedure Laterality Date  . DILITATION & CURRETTAGE/HYSTROSCOPY WITH NOVASURE ABLATION N/A 10/08/2017   Procedure: DILATATION & CURETTAGE/HYSTEROSCOPY WITH MINERVA  ENDOMETRIAL ABLATION (Procedure #2);  Surgeon: [name], MD;  Location: [facility];  Service: Gynecology;  Laterality: N/A;  . ESOPHAGOGASTRODUODENOSCOPY N/A 12/10/2014   SLF: hematoemesis due to moderate erosive gastritis, duoedentitis, and duodenal ulcer  . LEEP    . surgery for endometriosis  2005  . TUBAL LIGATION Bilateral 10/08/2017   Procedure: BILATERAL TUBAL STERILIZATION WITH FALLOPE RINGS APPLICATION (Procedure #1);  Surgeon: [name], MD;  Location: [facility];  Service: Gynecology;  Laterality: Bilateral;     OB History    Gravida  3   Para  2   Term  2   Preterm      AB  1   Living  2     SAB      TAB  1   Ectopic      Multiple  0   Live Births  2  Family History  Problem Relation Age of Onset  . Cancer Mother        kidney   . Asthma Mother   . Hypertension Mother   . Cancer Paternal Grandmother   . Asthma Son   . Colon cancer Neg Hx     Social History   Tobacco Use  . Smoking status: Former Smoker    Packs/day: 0.25    Years: 14.00    Pack years: 3.50    Types: Cigarettes  . Smokeless tobacco: Never Used  . Tobacco comment: QUIT SMOKING ON JULY 11th. STATES WOULD ONLY HAVE A FEW CIGARETTES A WEEK PRIOR TO THIS.   Substance Use  Topics  . Alcohol use: No    Alcohol/week: 0.0 standard drinks  . Drug use: No    Home Medications Prior to Admission medications   Medication Sig Start Date End Date Taking? Authorizing Provider  acetaminophen (TYLENOL) 500 MG tablet Take 1,500 mg by mouth every 6 (six) hours as needed.    [provider]  Chlorphen-Pseudoephed-APAP (THERAFLU FLU/COLD PO) Take 30 mLs by mouth 2 (two) times daily.    [provider]  dicyclomine (BENTYL) 10 MG capsule Take 1 capsule (10 mg total) by mouth 3 (three) times daily before meals. Patient not taking: Reported on 03/19/2018 03/04/18   [name], MD  guaiFENesin-dextromethorphan [facility] DM) 100-10 MG/5ML syrup Take 5 mLs by mouth every 4 (four) hours as needed for cough. 07/16/18   Davonna Belling, MD  oxyCODONE-acetaminophen (PERCOCET/ROXICET) 5-325 MG tablet Take 1 tablet by mouth every 6 (six) hours as needed for moderate pain or severe pain. Patient not taking: Reported on 03/19/2018 03/05/18   [name], MD  potassium chloride SA (K-DUR,KLOR-CON) 20 MEQ tablet Take 1 tablet (20 mEq total) by mouth 2 (two) times daily. Patient not taking: Reported on 03/19/2018 02/27/18   [name], MD  predniSONE (DELTASONE) 20 MG tablet Take 2 tablets (40 mg total) by mouth daily. Patient not taking: Reported on 07/16/2018 03/17/18   Francine Graven, DO  promethazine (PHENERGAN) 25 MG tablet Take 1 tablet (25 mg total) by mouth every 6 (six) hours as needed for nausea or vomiting (nausea). Patient not taking: Reported on 03/19/2018 02/24/18   [name], MD    Allergies    Ibuprofen and Flexeril [cyclobenzaprine]  Review of Systems   Review of Systems  Constitutional: Positive for appetite change. Negative for diaphoresis, fatigue, fever and unexpected weight change.  HENT: Negative for mouth sores.   Eyes: Negative for visual disturbance.  Respiratory: Negative for cough, chest tightness, shortness of breath  and wheezing.   Cardiovascular: Negative for chest pain.  Gastrointestinal: Positive for abdominal pain and diarrhea. Negative for constipation, nausea and vomiting.  Endocrine: Negative for polydipsia, polyphagia and polyuria.  Genitourinary: Negative for dysuria, frequency, hematuria and urgency.  Musculoskeletal: Negative for back pain and neck stiffness.  Skin: Negative for rash.  Allergic/Immunologic: Negative for immunocompromised state.  Neurological: Negative for syncope, light-headedness and headaches.  Hematological: Does not bruise/bleed easily.  Psychiatric/Behavioral: Negative for sleep disturbance. The patient is not nervous/anxious.     Physical Exam Updated Vital Signs BP (!) 150/120 (BP Location: Right Arm)   Pulse 91   Temp (!) 96.8 F (36 C) (Temporal)   Resp 18   Ht 5\' 4"  (1.626 m)   Wt 70.3 kg   SpO2 100%   BMI 26.61 kg/m   Physical Exam Vitals and nursing note reviewed.  Constitutional:  General: She is not in acute distress.    Appearance: She is ill-appearing.     Comments: Writhing in bed, crying  HENT:     Head: Normocephalic.  Eyes:     General: No scleral icterus.    Conjunctiva/sclera: Conjunctivae normal.  Cardiovascular:     Rate and Rhythm: Normal rate and regular rhythm.     Pulses: Normal pulses.          Radial pulses are 2+ on the right side and 2+ on the left side.  Pulmonary:     Effort: No tachypnea, accessory muscle usage, prolonged expiration, respiratory distress or retractions.     Breath sounds: No stridor.     Comments: Equal chest rise. No increased work of breathing. Abdominal:     General: There is no distension.     Palpations: Abdomen is soft.     Tenderness: There is abdominal tenderness in the right lower quadrant. There is guarding and rebound. There is no right CVA tenderness or left CVA tenderness.  Musculoskeletal:     Cervical back: Normal range of motion.     Comments: Moves all extremities equally and  without difficulty.  Skin:    General: Skin is warm and dry.     Capillary Refill: Capillary refill takes less than 2 seconds.  Neurological:     Mental Status: She is alert.     GCS: GCS eye subscore is 4. GCS verbal subscore is 5. GCS motor subscore is 6.     Comments: Speech is clear and goal oriented.  Psychiatric:        Mood and Affect: Mood normal.     ED Results / Procedures / Treatments   Labs (all labs ordered are listed, but only abnormal results are displayed) Labs Reviewed  COMPREHENSIVE METABOLIC PANEL - Abnormal; Notable for the following components:      Result Value   Glucose, Bld 101 (*)    Total Protein 8.2 (*)    All other components within normal limits  CBC WITH DIFFERENTIAL/PLATELET  LIPASE, BLOOD  LACTIC ACID, PLASMA  HCG, QUANTITATIVE, PREGNANCY  URINALYSIS, ROUTINE W REFLEX MICROSCOPIC    Procedures Procedures (including critical care time)  Medications Ordered in ED Medications  morphine 4 MG/ML injection 4 mg (4 mg Intravenous Given 06/03/19 1426)  ondansetron (ZOFRAN) injection 4 mg (4 mg Intravenous Given 06/03/19 1423)  iohexol (OMNIPAQUE) 300 MG/ML solution 100 mL (100 mLs Intravenous Contrast Given 06/03/19 1623)    ED Course  I have reviewed the triage vital signs and the nursing notes.  Pertinent labs & imaging results that were available during my care of the patient were reviewed by me and considered in my medical decision making (see chart for details).    MDM Rules/Calculators/A&P                      Patient presents with 9 days of right lower quadrant abdominal pain.  She is a history of chronic pelvic pain and endometriosis however, she does retain her appendix.  Patient reports this pain is worse than she has had in the past.  Will obtain labs, pain control and CT scan.  Patient denies all vaginal complaints.  She does not wish for pelvic exam at this time.  4:40 PM No leukocytosis, pregnancy test negative, electrolytes  reassuring.  Lactic acid within normal limits.  Pending CT scan and UA.  Patient's pain is well controlled at this time.  Her blood  pressure has improved.  She remains afebrile.  If her CT scan is negative, she will need ultrasound for rule out ovarian torsion.  At shift change care was transferred to [facility], PA-C who will follow pending studies, re-evaulate and determine disposition.     Final Clinical Impression(s) / ED Diagnoses Final diagnoses:  RLQ abdominal pain    Rx / DC Orders ED Discharge Orders    None       Anaya Bovee, Gwenlyn Perking 06/03/19 1641    Milton Ferguson, MD 06/05/19 1047

## 2019-06-10 ENCOUNTER — Ambulatory Visit: Payer: Medicaid Other | Admitting: Gastroenterology

## 2019-06-29 NOTE — Progress Notes (Signed)
Primary Care Physician:  Raiford Simmonds., PA-C Primary Gastroenterologist:  Dr. Oneida Alar   Chief Complaint  Patient presents with  . Diarrhea    taking Imodium  . Abdominal Pain    right lower abd; taking Tylenol    HPI:   Valerie Huber is a 34 y.o. female presenting today at the request of [name] due to abdominal pain. History of erosive gastritis/duodenitis, duodenal ulcer, +h.pylori and needs documentation of eradication.   CT reviewed from ED: no acute GI findings. GYN abnormalities followed up on with US pelvis. CBC normal. LFTs normal. She was hemoccult positive in 2019.   Diarrhea present for over a year, at least to 2019. Was self-medicating with tylenol, Imodium AD. Couldn't take it any longer and went back to ED in Jan 2021. Would have 9-12 episodes of diarrhea prior to starting Imodium but now 3-4 times per day. Abdominal pain in lower abdomen but majority of time is in RLQ. Will take up to 4 imodium a day and 15 tylenol a day. Dicyclomine did not help at all. Will have intermittent low volume hematochezia, last 11 days ago. Postprandial component.   Not eating as much anymore. Decreased appetite now. Makes herself eat.   Past Medical History:  Diagnosis Date  . Asthma   . Endometriosis   . Irregular periods 12/31/2014  . Pelvic pain in female 12/31/2014  . Pregnant 04/14/2015  . Second degree uterine prolapse 12/31/2014  . Seizures ([place])   . Vaginal Pap smear, abnormal     Past Surgical History:  Procedure Laterality Date  . DILITATION & CURRETTAGE/HYSTROSCOPY WITH NOVASURE ABLATION N/A 10/08/2017   Procedure: DILATATION & CURETTAGE/HYSTEROSCOPY WITH MINERVA  ENDOMETRIAL ABLATION (Procedure #2);  Surgeon: [name], MD;  Location: [facility];  Service: Gynecology;  Laterality: N/A;  . ESOPHAGOGASTRODUODENOSCOPY N/A 12/10/2014   SLF: hematoemesis due to moderate erosive gastritis, duoedentitis, and duodenal ulcer  . LEEP    . surgery for  endometriosis  2005  . TUBAL LIGATION Bilateral 10/08/2017   Procedure: BILATERAL TUBAL STERILIZATION WITH FALLOPE RINGS APPLICATION (Procedure #1);  Surgeon: [name], MD;  Location: [facility];  Service: Gynecology;  Laterality: Bilateral;    Current Outpatient Medications  Medication Sig Dispense Refill  . acetaminophen (TYLENOL) 500 MG tablet Take 1,500 mg by mouth every 6 (six) hours as needed.    . Chlorphen-Pseudoephed-APAP (THERAFLU FLU/COLD PO) Take 30 mLs by mouth as needed.     [name] guaiFENesin-dextromethorphan (ROBITUSSIN DM) 100-10 MG/5ML syrup Take 5 mLs by mouth every 4 (four) hours as needed for cough. 118 mL 0  . loperamide (IMODIUM A-D) 2 MG tablet Take 4 mg by mouth as needed for diarrhea or loose stools.    . hyoscyamine (LEVSIN SL) 0.125 MG SL tablet Place 1 tablet (0.125 mg total) under the tongue every 4 (four) hours as needed. 60 tablet 3   No current facility-administered medications for this visit.    Allergies as of 06/30/2019 - Review Complete 06/30/2019  Allergen Reaction Noted  . Ibuprofen Anaphylaxis 01/01/2014  . Flexeril [cyclobenzaprine] Hives 12/25/2012    Family History  Problem Relation Age of Onset  . Cancer Mother        kidney   . Asthma Mother   . Hypertension Mother   . Colon polyps Sister        47, large polyp, done at [facility]   . Cancer Paternal Grandmother   . Asthma Son   .  Colon cancer Neg Hx     Social History   Socioeconomic History  . Marital status: Single    Spouse name: Not on file  . Number of children: Not on file  . Years of education: Not on file  . Highest education level: Not on file  Occupational History  . Occupation: Housecleaning  Tobacco Use  . Smoking status: Former Smoker    Packs/day: 0.25    Years: 14.00    Pack years: 3.50    Types: Cigarettes  . Smokeless tobacco: Never Used  . Tobacco comment: quit in 2017   Substance and Sexual Activity  . Alcohol use: Yes    Alcohol/week: 0.0 standard drinks      Comment: occ  . Drug use: No  . Sexual activity: Not Currently    Birth control/protection: Surgical    Comment: tubal and ablation  Other Topics Concern  . Not on file  Social History Narrative  . Not on file   Social Determinants of Health   Financial Resource Strain:   . Difficulty of Paying Living Expenses: Not on file  Food Insecurity:   . Worried About Charity fundraiser in the Last Year: Not on file  . Ran Out of Food in the Last Year: Not on file  Transportation Needs:   . Lack of Transportation (Medical): Not on file  . Lack of Transportation (Non-Medical): Not on file  Physical Activity:   . Days of Exercise per Week: Not on file  . Minutes of Exercise per Session: Not on file  Stress:   . Feeling of Stress : Not on file  Social Connections:   . Frequency of Communication with Friends and Family: Not on file  . Frequency of Social Gatherings with Friends and Family: Not on file  . Attends Religious Services: Not on file  . Active Member of Clubs or Organizations: Not on file  . Attends Archivist Meetings: Not on file  . Marital Status: Not on file  Intimate Partner Violence:   . Fear of Current or Ex-Partner: Not on file  . Emotionally Abused: Not on file  . Physically Abused: Not on file  . Sexually Abused: Not on file    Review of Systems: Gen: Denies any fever, chills, fatigue, weight loss, lack of appetite.  CV: Denies chest pain, heart palpitations, peripheral edema, syncope.  Resp: Denies shortness of breath at rest or with exertion. Denies wheezing or cough.  GI: see HPI GU : Denies urinary burning, urinary frequency, urinary hesitancy MS: Denies joint pain, muscle weakness, cramps, or limitation of movement.  Derm: Denies rash, itching, dry skin Psych: Denies depression, anxiety, memory loss, and confusion Heme: see HPI  Physical Exam: BP 115/82   Pulse 89   Temp (!) 97.1 F (36.2 C) (Temporal)   Ht 5\' 4"  (1.626 m)   Wt 156 lb  (70.8 kg)   BMI 26.78 kg/m  General:   Alert and oriented. Pleasant and cooperative. Well-nourished and well-developed.  Head:  Normocephalic and atraumatic. Eyes:  Without icterus, sclera clear and conjunctiva pink.  Ears:  Normal auditory acuity. Mouth:  No deformity or lesions, oral mucosa pink.  Lungs:  Clear to auscultation bilaterally.  Heart:  S1, S2 present  Abdomen:  +BS, soft, non-tender and non-distended. No HSM noted. No guarding or rebound. No masses appreciated.  Rectal:  Deferred  Msk:  Symmetrical without gross deformities. Normal posture. Extremities:  Without edema. Neurologic:  Alert and  oriented  x4;  grossly normal neurologically. Psych:  Alert and cooperative. Normal mood and affect.  ASSESSMENT: Valerie Huber is a 34 y.o. female presenting today with a several year history of diarrhea, lower abdominal and RLQ abdominal pain, intermittent low-volume hematochezia, with symptoms unresponsive to supportive measures including Imodium and dicyclomine. Recent CT in ED without acute GI findings. No prior colonoscopy. Due to chronicity of symptoms, will hold off on stool studies. Needs diagnostic colonoscopy in near future. As dicyclomine was not helpful, will trial Levsin. She was advised to limit tylenol dosing, as she is taking a significant amount daily. Recent LFTs normal.   History of H.pylori duodenal ulcer: s/p treatment in 2016. Check urea breath test.    PLAN:  Proceed with TCS with Dr. Gala Romney in near future using Propofol: the risks, benefits, and alternatives have been discussed with the patient in detail. The patient states understanding and desires to proceed.  Trial of Levsin  Urea breath test ordered  Limit tylenol  Call if no improvement with Levsin in interim  Annitta Needs, PhD, [facility]astroenter[facility]

## 2019-06-29 NOTE — H&P (View-Only) (Signed)
Primary Care Physician:  Raiford Simmonds., PA-C Primary Gastroenterologist:  Dr. Oneida Alar   Chief Complaint  Patient presents with  . Diarrhea    taking Imodium  . Abdominal Pain    right lower abd; taking Tylenol    HPI:   Valerie Huber is a 34 y.o. female presenting today at the request of [name] due to abdominal pain. History of erosive gastritis/duodenitis, duodenal ulcer, +h.pylori and needs documentation of eradication.   CT reviewed from ED: no acute GI findings. GYN abnormalities followed up on with US pelvis. CBC normal. LFTs normal. She was hemoccult positive in 2019.   Diarrhea present for over a year, at least to 2019. Was self-medicating with tylenol, Imodium AD. Couldn't take it any longer and went back to ED in Jan 2021. Would have 9-12 episodes of diarrhea prior to starting Imodium but now 3-4 times per day. Abdominal pain in lower abdomen but majority of time is in RLQ. Will take up to 4 imodium a day and 15 tylenol a day. Dicyclomine did not help at all. Will have intermittent low volume hematochezia, last 11 days ago. Postprandial component.   Not eating as much anymore. Decreased appetite now. Makes herself eat.   Past Medical History:  Diagnosis Date  . Asthma   . Endometriosis   . Irregular periods 12/31/2014  . Pelvic pain in female 12/31/2014  . Pregnant 04/14/2015  . Second degree uterine prolapse 12/31/2014  . Seizures ([place])   . Vaginal Pap smear, abnormal     Past Surgical History:  Procedure Laterality Date  . DILITATION & CURRETTAGE/HYSTROSCOPY WITH NOVASURE ABLATION N/A 10/08/2017   Procedure: DILATATION & CURETTAGE/HYSTEROSCOPY WITH MINERVA  ENDOMETRIAL ABLATION (Procedure #2);  Surgeon: [name], MD;  Location: [facility];  Service: Gynecology;  Laterality: N/A;  . ESOPHAGOGASTRODUODENOSCOPY N/A 12/10/2014   SLF: hematoemesis due to moderate erosive gastritis, duoedentitis, and duodenal ulcer  . LEEP    . surgery for  endometriosis  2005  . TUBAL LIGATION Bilateral 10/08/2017   Procedure: BILATERAL TUBAL STERILIZATION WITH FALLOPE RINGS APPLICATION (Procedure #1);  Surgeon: [name], MD;  Location: [facility];  Service: Gynecology;  Laterality: Bilateral;    Current Outpatient Medications  Medication Sig Dispense Refill  . acetaminophen (TYLENOL) 500 MG tablet Take 1,500 mg by mouth every 6 (six) hours as needed.    . Chlorphen-Pseudoephed-APAP (THERAFLU FLU/COLD PO) Take 30 mLs by mouth as needed.     [name] guaiFENesin-dextromethorphan (ROBITUSSIN DM) 100-10 MG/5ML syrup Take 5 mLs by mouth every 4 (four) hours as needed for cough. 118 mL 0  . loperamide (IMODIUM A-D) 2 MG tablet Take 4 mg by mouth as needed for diarrhea or loose stools.    . hyoscyamine (LEVSIN SL) 0.125 MG SL tablet Place 1 tablet (0.125 mg total) under the tongue every 4 (four) hours as needed. 60 tablet 3   No current facility-administered medications for this visit.    Allergies as of 06/30/2019 - Review Complete 06/30/2019  Allergen Reaction Noted  . Ibuprofen Anaphylaxis 01/01/2014  . Flexeril [cyclobenzaprine] Hives 12/25/2012    Family History  Problem Relation Age of Onset  . Cancer Mother        kidney   . Asthma Mother   . Hypertension Mother   . Colon polyps Sister        48, large polyp, done at [facility]   . Cancer Paternal Grandmother   . Asthma Son   .  Colon cancer Neg Hx     Social History   Socioeconomic History  . Marital status: Single    Spouse name: Not on file  . Number of children: Not on file  . Years of education: Not on file  . Highest education level: Not on file  Occupational History  . Occupation: Housecleaning  Tobacco Use  . Smoking status: Former Smoker    Packs/day: 0.25    Years: 14.00    Pack years: 3.50    Types: Cigarettes  . Smokeless tobacco: Never Used  . Tobacco comment: quit in 2017   Substance and Sexual Activity  . Alcohol use: Yes    Alcohol/week: 0.0 standard drinks      Comment: occ  . Drug use: No  . Sexual activity: Not Currently    Birth control/protection: Surgical    Comment: tubal and ablation  Other Topics Concern  . Not on file  Social History Narrative  . Not on file   Social Determinants of Health   Financial Resource Strain:   . Difficulty of Paying Living Expenses: Not on file  Food Insecurity:   . Worried About Charity fundraiser in the Last Year: Not on file  . Ran Out of Food in the Last Year: Not on file  Transportation Needs:   . Lack of Transportation (Medical): Not on file  . Lack of Transportation (Non-Medical): Not on file  Physical Activity:   . Days of Exercise per Week: Not on file  . Minutes of Exercise per Session: Not on file  Stress:   . Feeling of Stress : Not on file  Social Connections:   . Frequency of Communication with Friends and Family: Not on file  . Frequency of Social Gatherings with Friends and Family: Not on file  . Attends Religious Services: Not on file  . Active Member of Clubs or Organizations: Not on file  . Attends Archivist Meetings: Not on file  . Marital Status: Not on file  Intimate Partner Violence:   . Fear of Current or Ex-Partner: Not on file  . Emotionally Abused: Not on file  . Physically Abused: Not on file  . Sexually Abused: Not on file    Review of Systems: Gen: Denies any fever, chills, fatigue, weight loss, lack of appetite.  CV: Denies chest pain, heart palpitations, peripheral edema, syncope.  Resp: Denies shortness of breath at rest or with exertion. Denies wheezing or cough.  GI: see HPI GU : Denies urinary burning, urinary frequency, urinary hesitancy MS: Denies joint pain, muscle weakness, cramps, or limitation of movement.  Derm: Denies rash, itching, dry skin Psych: Denies depression, anxiety, memory loss, and confusion Heme: see HPI  Physical Exam: BP 115/82   Pulse 89   Temp (!) 97.1 F (36.2 C) (Temporal)   Ht 5\' 4"  (1.626 m)   Wt 156 lb  (70.8 kg)   BMI 26.78 kg/m  General:   Alert and oriented. Pleasant and cooperative. Well-nourished and well-developed.  Head:  Normocephalic and atraumatic. Eyes:  Without icterus, sclera clear and conjunctiva pink.  Ears:  Normal auditory acuity. Mouth:  No deformity or lesions, oral mucosa pink.  Lungs:  Clear to auscultation bilaterally.  Heart:  S1, S2 present  Abdomen:  +BS, soft, non-tender and non-distended. No HSM noted. No guarding or rebound. No masses appreciated.  Rectal:  Deferred  Msk:  Symmetrical without gross deformities. Normal posture. Extremities:  Without edema. Neurologic:  Alert and  oriented  x4;  grossly normal neurologically. Psych:  Alert and cooperative. Normal mood and affect.  ASSESSMENT: Tomiya Sama is a 34 y.o. female presenting today with a several year history of diarrhea, lower abdominal and RLQ abdominal pain, intermittent low-volume hematochezia, with symptoms unresponsive to supportive measures including Imodium and dicyclomine. Recent CT in ED without acute GI findings. No prior colonoscopy. Due to chronicity of symptoms, will hold off on stool studies. Needs diagnostic colonoscopy in near future. As dicyclomine was not helpful, will trial Levsin. She was advised to limit tylenol dosing, as she is taking a significant amount daily. Recent LFTs normal.   History of H.pylori duodenal ulcer: s/p treatment in 2016. Check urea breath test.    PLAN:  Proceed with TCS with Dr. Gala Romney in near future using Propofol: the risks, benefits, and alternatives have been discussed with the patient in detail. The patient states understanding and desires to proceed.  Trial of Levsin  Urea breath test ordered  Limit tylenol  Call if no improvement with Levsin in interim  Annitta Needs, PhD, [facility] Gastroenterology

## 2019-06-30 ENCOUNTER — Encounter: Payer: Self-pay | Admitting: Gastroenterology

## 2019-06-30 ENCOUNTER — Other Ambulatory Visit: Payer: Self-pay

## 2019-06-30 ENCOUNTER — Ambulatory Visit: Payer: Medicaid Other | Admitting: Gastroenterology

## 2019-06-30 VITALS — BP 115/82 | HR 89 | Temp 97.1°F | Ht 64.0 in | Wt 156.0 lb

## 2019-06-30 DIAGNOSIS — K529 Noninfective gastroenteritis and colitis, unspecified: Secondary | ICD-10-CM | POA: Diagnosis not present

## 2019-06-30 DIAGNOSIS — Z8619 Personal history of other infectious and parasitic diseases: Secondary | ICD-10-CM

## 2019-06-30 DIAGNOSIS — R195 Other fecal abnormalities: Secondary | ICD-10-CM | POA: Diagnosis not present

## 2019-06-30 MED ORDER — HYOSCYAMINE SULFATE 0.125 MG SL SUBL: 0.1250 mg | SUBLINGUAL_TABLET | SUBLINGUAL | 3 refills | Status: DC | PRN

## 2019-06-30 NOTE — Patient Instructions (Addendum)
We will be contacting you to arrange the colonoscopy.   I have sent in Levsin to take every 6 hours as needed for cramping.  Do not take more than 4 grams (4,000 milligrams) of tylenol per 24 hours, as this can severely damage your liver.  I have ordered a breath test to do at the lab to ensure the bacteria was completely treated!  Further recommendations to follow!  It was a pleasure to see you today. I want to create trusting relationships with patients to provide genuine, compassionate, and quality care. I value your feedback. If you receive a survey regarding your visit,  I greatly appreciate you taking time to fill this out.   Annitta Needs, PhD, ANP-BC [facility] Gastroenterology

## 2019-07-01 ENCOUNTER — Other Ambulatory Visit: Payer: Self-pay

## 2019-07-01 ENCOUNTER — Telehealth: Payer: Self-pay

## 2019-07-01 DIAGNOSIS — Z8619 Personal history of other infectious and parasitic diseases: Secondary | ICD-10-CM | POA: Diagnosis not present

## 2019-07-01 MED ORDER — CLENPIQ 10-3.5-12 MG-GM -GM/160ML PO SOLN: 1.0000 | Freq: Once | ORAL | 0 refills | Status: AC

## 2019-07-01 NOTE — Telephone Encounter (Signed)
TCS w/Prop scheduled with [facility] ([facility] doesn't have any openings) 07/06/19 at 1:15pm. Rx for prep sent to pharmacy. Orders entered. Pt will receive pre-op phone call 07/03/19. COVID test 07/03/19 at 10:00am. Pt aware. She is also aware procedure instructions were faxed to pharmacy.

## 2019-07-02 LAB — H. PYLORI BREATH TEST: H. pylori Breath Test: NOT DETECTED

## 2019-07-03 ENCOUNTER — Other Ambulatory Visit (HOSPITAL_COMMUNITY)
Admission: RE | Admit: 2019-07-03 | Discharge: 2019-07-03 | Disposition: A | Discharge status: Home / Self Care | Payer: Medicaid Other | Source: Ambulatory Visit | Attending: Internal Medicine | Admitting: Internal Medicine

## 2019-07-03 ENCOUNTER — Encounter (HOSPITAL_COMMUNITY)
Admission: RE | Admit: 2019-07-03 | Discharge: 2019-07-03 | Disposition: A | Discharge status: Home / Self Care | Payer: Medicaid Other | Source: Ambulatory Visit | Attending: Internal Medicine | Admitting: Internal Medicine

## 2019-07-03 ENCOUNTER — Other Ambulatory Visit: Payer: Self-pay

## 2019-07-03 ENCOUNTER — Encounter (HOSPITAL_COMMUNITY): Payer: Self-pay

## 2019-07-03 DIAGNOSIS — Z20822 Contact with and (suspected) exposure to covid-19: Secondary | ICD-10-CM | POA: Diagnosis not present

## 2019-07-03 DIAGNOSIS — Z01812 Encounter for preprocedural laboratory examination: Secondary | ICD-10-CM | POA: Insufficient documentation

## 2019-07-03 LAB — SARS CORONAVIRUS 2 (TAT 6-24 HRS): SARS Coronavirus 2: NEGATIVE

## 2019-07-06 ENCOUNTER — Ambulatory Visit (HOSPITAL_COMMUNITY)
Admission: RE | Admit: 2019-07-06 | Discharge: 2019-07-06 | Disposition: A | Discharge status: Home / Self Care | Payer: Medicaid Other | Attending: Internal Medicine | Admitting: Internal Medicine

## 2019-07-06 ENCOUNTER — Ambulatory Visit (HOSPITAL_COMMUNITY): Payer: Medicaid Other | Admitting: Anesthesiology

## 2019-07-06 ENCOUNTER — Encounter (HOSPITAL_COMMUNITY): Payer: Self-pay | Admitting: Internal Medicine

## 2019-07-06 ENCOUNTER — Encounter (HOSPITAL_COMMUNITY)
Admission: RE | Disposition: A | Discharge status: Home / Self Care | Payer: Self-pay | Source: Home / Self Care | Attending: Internal Medicine

## 2019-07-06 DIAGNOSIS — Z8719 Personal history of other diseases of the digestive system: Secondary | ICD-10-CM | POA: Insufficient documentation

## 2019-07-06 DIAGNOSIS — Z809 Family history of malignant neoplasm, unspecified: Secondary | ICD-10-CM | POA: Diagnosis not present

## 2019-07-06 DIAGNOSIS — K635 Polyp of colon: Secondary | ICD-10-CM | POA: Insufficient documentation

## 2019-07-06 DIAGNOSIS — J45909 Unspecified asthma, uncomplicated: Secondary | ICD-10-CM | POA: Diagnosis not present

## 2019-07-06 DIAGNOSIS — Z8619 Personal history of other infectious and parasitic diseases: Secondary | ICD-10-CM | POA: Diagnosis not present

## 2019-07-06 DIAGNOSIS — Z886 Allergy status to analgesic agent status: Secondary | ICD-10-CM | POA: Diagnosis not present

## 2019-07-06 DIAGNOSIS — Z888 Allergy status to other drugs, medicaments and biological substances status: Secondary | ICD-10-CM | POA: Diagnosis not present

## 2019-07-06 DIAGNOSIS — R197 Diarrhea, unspecified: Secondary | ICD-10-CM | POA: Diagnosis not present

## 2019-07-06 DIAGNOSIS — Z8051 Family history of malignant neoplasm of kidney: Secondary | ICD-10-CM | POA: Insufficient documentation

## 2019-07-06 DIAGNOSIS — K529 Noninfective gastroenteritis and colitis, unspecified: Secondary | ICD-10-CM | POA: Insufficient documentation

## 2019-07-06 DIAGNOSIS — K625 Hemorrhage of anus and rectum: Secondary | ICD-10-CM | POA: Diagnosis not present

## 2019-07-06 DIAGNOSIS — Z87891 Personal history of nicotine dependence: Secondary | ICD-10-CM | POA: Diagnosis not present

## 2019-07-06 HISTORY — PX: COLONOSCOPY WITH PROPOFOL: SHX5780

## 2019-07-06 HISTORY — PX: POLYPECTOMY: SHX5525

## 2019-07-06 HISTORY — PX: BIOPSY: SHX5522

## 2019-07-06 LAB — HCG, SERUM, QUALITATIVE: Preg, Serum: NEGATIVE

## 2019-07-06 SURGERY — COLONOSCOPY WITH PROPOFOL
Anesthesia: General

## 2019-07-06 MED ORDER — PROMETHAZINE HCL 25 MG/ML IJ SOLN: 6.2500 mg | INTRAMUSCULAR | Status: DC | PRN

## 2019-07-06 MED ORDER — CHLORHEXIDINE GLUCONATE CLOTH 2 % EX PADS: 6.0000 | MEDICATED_PAD | Freq: Once | CUTANEOUS | Status: DC

## 2019-07-06 MED ORDER — PROPOFOL 10 MG/ML IV BOLUS
INTRAVENOUS | Status: AC
  Filled 2019-07-06: qty 40

## 2019-07-06 MED ORDER — STERILE WATER FOR IRRIGATION IR SOLN
Status: DC | PRN
  Administered 2019-07-06: 100 mL

## 2019-07-06 MED ORDER — MIDAZOLAM HCL 2 MG/2ML IJ SOLN
INTRAMUSCULAR | Status: AC
  Filled 2019-07-06: qty 2

## 2019-07-06 MED ORDER — SIMETHICONE 40 MG/0.6ML PO SUSP
ORAL | Status: AC
  Filled 2019-07-06: qty 0.6

## 2019-07-06 MED ORDER — SIMETHICONE 80 MG PO CHEW
80.0000 mg | CHEWABLE_TABLET | Freq: Four times a day (QID) | ORAL | Status: DC | PRN
  Filled 2019-07-06: qty 1

## 2019-07-06 MED ORDER — LACTATED RINGERS IV SOLN: INTRAVENOUS | Status: DC

## 2019-07-06 MED ORDER — HYDROCODONE-ACETAMINOPHEN 7.5-325 MG PO TABS: 1.0000 | ORAL_TABLET | Freq: Once | ORAL | Status: DC | PRN

## 2019-07-06 MED ORDER — MIDAZOLAM HCL 5 MG/5ML IJ SOLN
INTRAMUSCULAR | Status: DC | PRN
  Administered 2019-07-06: 2 mg via INTRAVENOUS

## 2019-07-06 MED ORDER — MIDAZOLAM HCL 2 MG/2ML IJ SOLN: 0.5000 mg | Freq: Once | INTRAMUSCULAR | Status: DC | PRN

## 2019-07-06 MED ORDER — PROPOFOL 10 MG/ML IV BOLUS
INTRAVENOUS | Status: DC | PRN
  Administered 2019-07-06: 40 mg via INTRAVENOUS
  Administered 2019-07-06: 20 mg via INTRAVENOUS
  Administered 2019-07-06: 10 mg via INTRAVENOUS
  Administered 2019-07-06: 30 mg via INTRAVENOUS

## 2019-07-06 MED ORDER — HYDROMORPHONE HCL 1 MG/ML IJ SOLN
0.2500 mg | INTRAMUSCULAR | Status: DC | PRN
  Administered 2019-07-06: 0.5 mg via INTRAVENOUS
  Filled 2019-07-06: qty 0.5

## 2019-07-06 MED ORDER — PROPOFOL 500 MG/50ML IV EMUL
INTRAVENOUS | Status: DC | PRN
  Administered 2019-07-06: 150 ug/kg/min via INTRAVENOUS

## 2019-07-06 NOTE — Transfer of Care (Signed)
Immediate Anesthesia Transfer of Care Note  Patient: Valerie Huber  Procedure(s) Performed: COLONOSCOPY WITH PROPOFOL (N/A ) BIOPSY POLYPECTOMY  Patient Location: PACU  Anesthesia Type:General  Level of Consciousness: awake  Airway & Oxygen Therapy: Patient Spontanous Breathing  Post-op Assessment: Report given to RN  Post vital signs: Reviewed  Last Vitals:  Vitals Value Taken Time  BP 97/68 07/06/19 1358  Temp    Pulse 41 07/06/19 1358  Resp 23 07/06/19 1359  SpO2 81 % 07/06/19 1358  Vitals shown include unvalidated device data.  Last Pain:  Vitals:   07/06/19 1352  TempSrc:   PainSc: 0-No pain      Patients Stated Pain Goal: 5 ([id number])  Complications: No apparent anesthesia complications

## 2019-07-06 NOTE — Anesthesia Postprocedure Evaluation (Signed)
Anesthesia Post Note  Patient: Valerie Huber  Procedure(s) Performed: COLONOSCOPY WITH PROPOFOL (N/A ) BIOPSY POLYPECTOMY  Patient location during evaluation: PACU Anesthesia Type: General Level of consciousness: awake and alert and oriented Pain management: pain level controlled Vital Signs Assessment: post-procedure vital signs reviewed and stable Respiratory status: spontaneous breathing Cardiovascular status: blood pressure returned to baseline and stable Postop Assessment: no apparent nausea or vomiting Anesthetic complications: no     Last Vitals:  Vitals:   07/06/19 1254  BP: 122/77  Pulse: 75  Resp: 17  Temp: 36.7 C  SpO2: 100%    Last Pain:  Vitals:   07/06/19 1352  TempSrc:   PainSc: 0-No pain                 Acel Natzke

## 2019-07-06 NOTE — Interval H&P Note (Signed)
History and Physical Interval Note:  07/06/2019 12:44 PM  Valerie Huber  has presented today for surgery, with the diagnosis of rectal bleeding, diarrhea.  The various methods of treatment have been discussed with the patient and family. After consideration of risks, benefits and other options for treatment, the patient has consented to  Procedure(s) with comments: COLONOSCOPY WITH PROPOFOL (N/A) - 1:15pm as a surgical intervention.  The patient's history has been reviewed, patient examined, no change in status, stable for surgery.  I have reviewed the patient's chart and labs.  Questions were answered to the patient's satisfaction.     Jentzen Minasyan  No change.  Here for evaluation of chronic diarrhea via colonoscopy.  Sister at age 24 had multiple colonic polyps removed previously.  Diagnostic colonoscopy today per plan.  The risks, benefits, limitations, alternatives and imponderables have been reviewed with the patient. Questions have been answered. All parties are agreeable.

## 2019-07-06 NOTE — Anesthesia Preprocedure Evaluation (Signed)
Anesthesia Evaluation  Patient identified by MRN, date of birth, ID band Patient awake    Reviewed: Allergy & Precautions, NPO status , Patient's Chart, lab work & pertinent test results  Airway Mallampati: I  TM Distance: >3 FB Neck ROM: Full    Dental no notable dental hx. (+) Teeth Intact   Pulmonary neg pulmonary ROS, former smoker,    Pulmonary exam normal breath sounds clear to auscultation       Cardiovascular Exercise Tolerance: Good negative cardio ROS Normal cardiovascular examI Rhythm:Regular Rate:Normal     Neuro/Psych Seizures -, Well Controlled,  Off Sz meds -last was ~3-4 years prior -sounded like febrile Sz  Neuromuscular disease negative psych ROS   GI/Hepatic negative GI ROS, Neg liver ROS, Bowel prep,  Endo/Other  negative endocrine ROS  Renal/GU negative Renal ROS  negative genitourinary   Musculoskeletal negative musculoskeletal ROS (+)   Abdominal   Peds negative pediatric ROS (+)  Hematology negative hematology ROS (+)   Anesthesia Other Findings   Reproductive/Obstetrics negative OB ROS                             Anesthesia Physical Anesthesia Plan  ASA: II  Anesthesia Plan: General   Post-op Pain Management:    Induction: Intravenous  PONV Risk Score and Plan: 3 and TIVA, Propofol infusion, Midazolam and Treatment may vary due to age or medical condition  Airway Management Planned: Nasal Cannula and Simple Face Mask  Additional Equipment:   Intra-op Plan:   Post-operative Plan:   Informed Consent: I have reviewed the patients History and Physical, chart, labs and discussed the procedure including the risks, benefits and alternatives for the proposed anesthesia with the patient or authorized representative who has indicated his/her understanding and acceptance.     Dental advisory given  Plan Discussed with: CRNA  Anesthesia Plan Comments:  (Plan Full PPE use  Plan GA with GETA as needed d/w pt -WTP with same after Q&A)        Anesthesia Quick Evaluation

## 2019-07-06 NOTE — Op Note (Signed)
[facility] Patient Name: Valerie Huber Procedure Date: 07/06/2019 12:30 PM MRN: [id number] Date of Birth: 29-Nov-1985 Attending MD: Norvel Richards , MD CSN: [id number] Age: 34 Admit Type: Outpatient Procedure:                Colonoscopy Indications:              Chronic diarrhea Providers:                Norvel Richards, MD, Rosina Lowenstein, RN, Aram Candela Referring MD:              Medicines:                Propofol per Anesthesia Complications:            No immediate complications. Estimated Blood Loss:     Estimated blood loss was minimal. Procedure:                Pre-Anesthesia Assessment:                           - Prior to the procedure, a History and Physical                            was performed, and patient medications and                            allergies were reviewed. The patient's tolerance of                            previous anesthesia was also reviewed. The risks                            and benefits of the procedure and the sedation                            options and risks were discussed with the patient.                            All questions were answered, and informed consent                            was obtained. Prior Anticoagulants: The patient has                            taken no previous anticoagulant or antiplatelet                            agents. ASA Grade Assessment: II - A patient with                            mild systemic disease. After reviewing the risks  and benefits, the patient was deemed in                            satisfactory condition to undergo the procedure.                           After obtaining informed consent, the colonoscope                            was passed under direct vision. Throughout the                            procedure, the patient's blood pressure, pulse, and                            oxygen saturations were monitored  continuously. The                            [id number]-H190DL [id number] scope was introduced through                            the anus and advanced to the the cecum, identified                            by appendiceal orifice and ileocecal valve. The                            colonoscopy was performed without difficulty. The                            patient tolerated the procedure well. The quality                            of the bowel preparation was adequate. Scope In: 1:37:00 PM Scope Out: 1:51:01 PM Scope Withdrawal Time: 0 hours 9 minutes 59 seconds  Total Procedure Duration: 0 hours 14 minutes 1 second  Findings:      The perianal and digital rectal examinations were normal.      A 3 mm polyp was found in the recto-sigmoid colon. The polyp was       sessile. The polyp was removed with a cold snare. Resection and       retrieval were complete. Estimated blood loss was minimal. The remainder       the colonic mucosa appeared normal. Segmental biopsies taken. Due to       acute angulation, I was unable to intubate the terminal ileum Impression:               - One 3 mm polyp at the recto-sigmoid colon,                            removed with a cold snare. Resected and retrieved.                            Status post segmental biopsy Moderate Sedation:      Moderate (  conscious) sedation was personally administered by an       anesthesia professional. The following parameters were monitored: oxygen       saturation, heart rate, blood pressure, respiratory rate, EKG, adequacy       of pulmonary ventilation, and response to care. Recommendation:           - Patient has a contact number available for                            emergencies. The signs and symptoms of potential                            delayed complications were discussed with the                            patient. Return to normal activities tomorrow.                            Written discharge instructions were  provided to the                            patient.                           - Advance diet as tolerated. [place] up on pathology.                            Further recommendations to follow.                           - Repeat colonoscopy date to be determined after                            pending pathology results are reviewed for                            surveillance based on pathology results.                           - Return to GI office in 6 weeks. Procedure Code(s):        --- Professional ---                           [phone], Colonoscopy, flexible; with removal of                            tumor(s), polyp(s), or other lesion(s) by snare                            technique Diagnosis Code(s):        --- Professional ---                           K63.5, Polyp of colon  K52.9, Noninfective gastroenteritis and colitis,                            unspecified CPT copyright 2019 American Medical Association. All rights reserved. The codes documented in this report are preliminary and upon coder review may  be revised to meet current compliance requirements. Cristopher Estimable. Lilyan Prete, MD Norvel Richards, MD 07/06/2019 2:00:58 PM This report has been signed electronically. Number of Addenda: 0

## 2019-07-06 NOTE — Discharge Instructions (Signed)
Colonoscopy Discharge Instructions  Read the instructions outlined below and refer to this sheet in the next few weeks. These discharge instructions provide you with general information on caring for yourself after you leave the hospital. Your doctor may also give you specific instructions. While your treatment has been planned according to the most current medical practices available, unavoidable complications occasionally occur. If you have any problems or questions after discharge, call Dr. Gala Romney at [phone]. ACTIVITY  You may resume your regular activity, but move at a slower pace for the next 24 hours.   Take frequent rest periods for the next 24 hours.   Walking will help get rid of the air and reduce the bloated feeling in your belly (abdomen).   No driving for 24 hours (because of the medicine (anesthesia) used during the test).    Do not sign any important legal documents or operate any machinery for 24 hours (because of the anesthesia used during the test).  NUTRITION  Drink plenty of fluids.   You may resume your normal diet as instructed by your doctor.   Begin with a light meal and progress to your normal diet. Heavy or fried foods are harder to digest and may make you feel sick to your stomach (nauseated).   Avoid alcoholic beverages for 24 hours or as instructed.  MEDICATIONS  You may resume your normal medications unless your doctor tells you otherwise.  WHAT YOU CAN EXPECT TODAY  Some feelings of bloating in the abdomen.   Passage of more gas than usual.   Spotting of blood in your stool or on the toilet paper.  IF YOU HAD POLYPS REMOVED DURING THE COLONOSCOPY:  No aspirin products for 7 days or as instructed.   No alcohol for 7 days or as instructed.   Eat a soft diet for the next 24 hours.  FINDING OUT THE RESULTS OF YOUR TEST Not all test results are available during your visit. If your test results are not back during the visit, make an appointment  with your caregiver to find out the results. Do not assume everything is normal if you have not heard from your caregiver or the medical facility. It is important for you to follow up on all of your test results.  SEEK IMMEDIATE MEDICAL ATTENTION IF:  You have more than a spotting of blood in your stool.   Your belly is swollen (abdominal distention).   You are nauseated or vomiting.   You have a temperature over 101.   You have abdominal pain or discomfort that is severe or gets worse throughout the day.    Colon Polyps  Polyps are tissue growths inside the body. Polyps can grow in many places, including the large intestine (colon). A polyp may be a round bump or a mushroom-shaped growth. You could have one polyp or several. Most colon polyps are noncancerous (benign). However, some colon polyps can become cancerous over time. Finding and removing the polyps early can help prevent this. What are the causes? The exact cause of colon polyps is not known. What increases the risk? You are more likely to develop this condition if you:  Have a family history of colon cancer or colon polyps.  Are older than 67 or older than 45 if you are African American.  Have inflammatory bowel disease, such as ulcerative colitis or Crohn's disease.  Have certain hereditary conditions, such as: ? Familial adenomatous polyposis. ? Lynch syndrome. ? Turcot syndrome. ? Peutz-Jeghers syndrome.  Are  overweight.  Smoke cigarettes.  Do not get enough exercise.  Drink too much alcohol.  Eat a diet that is high in fat and red meat and low in fiber.  Had childhood cancer that was treated with abdominal radiation. What are the signs or symptoms? Most polyps do not cause symptoms. If you have symptoms, they may include:  Blood coming from your rectum when having a bowel movement.  Blood in your stool. The stool may look dark red or black.  Abdominal pain.  A change in bowel habits, such as  constipation or diarrhea. How is this diagnosed? This condition is diagnosed with a colonoscopy. This is a procedure in which a lighted, flexible scope is inserted into the anus and then passed into the colon to examine the area. Polyps are sometimes found when a colonoscopy is done as part of routine cancer screening tests. How is this treated? Treatment for this condition involves removing any polyps that are found. Most polyps can be removed during a colonoscopy. Those polyps will then be tested for cancer. Additional treatment may be needed depending on the results of testing. Follow these instructions at home: Lifestyle  Maintain a healthy weight, or lose weight if recommended by your health care provider.  Exercise every day or as told by your health care provider.  Do not use any products that contain nicotine or tobacco, such as cigarettes and e-cigarettes. If you need help quitting, ask your health care provider.  If you drink alcohol, limit how much you have: ? 0-1 drink a day for women. ? 0-2 drinks a day for men.  Be aware of how much alcohol is in your drink. In the [place] one drink equals one 12 oz bottle of beer (355 mL), one 5 oz glass of wine (148 mL), or one 1 oz shot of hard liquor (44 mL). Eating and drinking   Eat foods that are high in fiber, such as fruits, vegetables, and whole grains.  Eat foods that are high in calcium and vitamin D, such as milk, cheese, yogurt, eggs, liver, fish, and broccoli.  Limit foods that are high in fat, such as fried foods and desserts.  Limit the amount of red meat and processed meat you eat, such as hot dogs, sausage, bacon, and lunch meats. General instructions  Keep all follow-up visits as told by your health care provider. This is important. ? This includes having regularly scheduled colonoscopies. ? Talk to your health care provider about when you need a colonoscopy. Contact a health care provider if:  You have new or  worsening bleeding during a bowel movement.  You have new or increased blood in your stool.  You have a change in bowel habits.  You lose weight for no known reason. Summary  Polyps are tissue growths inside the body. Polyps can grow in many places, including the colon.  Most colon polyps are noncancerous (benign), but some can become cancerous over time.  This condition is diagnosed with a colonoscopy.  Treatment for this condition involves removing any polyps that are found. Most polyps can be removed during a colonoscopy. This information is not intended to replace advice given to you by your health care provider. Make sure you discuss any questions you have with your health care provider. Document Revised: 08/15/2017 Document Reviewed: 08/15/2017 Elsevier Patient Education  2020 [place] information provided  Further recommendations to follow pending review of pathology report  At patient request, I called Harle Battiest [phone]  discussed results  Office visit with [place] in 6 weeks

## 2019-07-06 NOTE — Progress Notes (Signed)
H.pylori has been eradicated.

## 2019-07-07 LAB — PREGNANCY, URINE: Preg Test, Ur: NEGATIVE

## 2019-07-08 LAB — SURGICAL PATHOLOGY

## 2019-07-10 ENCOUNTER — Encounter: Payer: Self-pay | Admitting: Internal Medicine

## 2019-07-13 ENCOUNTER — Telehealth: Payer: Self-pay | Admitting: *Deleted

## 2019-07-13 NOTE — Telephone Encounter (Signed)
Noted. Pt notified of samples to try and pickup. Pt is aware that if she develops any N/V, abdominal pain ect, she is to d/c medication. Pt's apt was moved up to July 22, 2019.

## 2019-07-13 NOTE — Telephone Encounter (Signed)
Let's provide samples of Viberzi 75 mg. Take twice a day with food. We have room to increase this if needed. She still has her gallbladder, so Viberzi is an appropriate option for supportive measures.   Make sure she does not drink alcohol while taking Viberzi. If any severe right upper quadrant pain, N/V when starting Viberzi, stop immediately. Small risk of pancreatitis with this medication in studies and most often side effects are with the first few doses.   Let's move up her appt to the next 2 weeks or so.

## 2019-07-13 NOTE — Telephone Encounter (Signed)
Pt says she is still having a lot of stomach pain and diarrhea.  Pt says it is so bad that it is making her irritable and weak.  She says that the medicine is not working (Hyoscyamine and Tylenol).  [facility] [address] is her pharmacy.  Pt's phone number: [phone]

## 2019-07-22 ENCOUNTER — Other Ambulatory Visit: Payer: Self-pay

## 2019-07-22 ENCOUNTER — Ambulatory Visit (INDEPENDENT_AMBULATORY_CARE_PROVIDER_SITE_OTHER): Payer: Medicaid Other | Admitting: Gastroenterology

## 2019-07-22 ENCOUNTER — Encounter: Payer: Self-pay | Admitting: Gastroenterology

## 2019-07-22 VITALS — BP 108/75 | HR 83 | Temp 97.3°F | Ht 64.0 in | Wt 159.2 lb

## 2019-07-22 DIAGNOSIS — K529 Noninfective gastroenteritis and colitis, unspecified: Secondary | ICD-10-CM

## 2019-07-22 DIAGNOSIS — R109 Unspecified abdominal pain: Secondary | ICD-10-CM

## 2019-07-22 DIAGNOSIS — N9489 Other specified conditions associated with female genital organs and menstrual cycle: Secondary | ICD-10-CM

## 2019-07-22 MED ORDER — HYOSCYAMINE SULFATE ER 0.375 MG PO TB12: 0.3750 mg | ORAL_TABLET | Freq: Two times a day (BID) | ORAL | 3 refills | Status: DC

## 2019-07-22 MED ORDER — RIFAXIMIN 550 MG PO TABS: 550.0000 mg | ORAL_TABLET | Freq: Three times a day (TID) | ORAL | 0 refills | Status: AC

## 2019-07-22 NOTE — Patient Instructions (Signed)
I have sent in [place] to take twice a day. This is longer acting. I have also sent in a course of Xifaxan to take three times a day for 2 weeks, then you will be completed with that course. Let us know how you do with this!  Please complete blood work when you are able.  Keep [place] updated on how things are going! Further recommendations to follow!  I enjoyed seeing you again today! As you know, I value our relationship and want to provide genuine, compassionate, and quality care. I welcome your feedback. If you receive a survey regarding your visit,  I greatly appreciate you taking time to fill this out. See you next time!  Annitta Needs, PhD, ANP-BC [facility]

## 2019-07-22 NOTE — Progress Notes (Signed)
Referring Provider: Raiford Simmonds., PA-C Primary Care Physician:  Raiford Simmonds., PA-C Primary GI: Dr. Gala Romney   Chief Complaint  Patient presents with  . Abdominal Pain  . Diarrhea    HPI:   Valerie Huber is a 34 y.o. female presenting today with a history of diarrhea dating back to 2019, abdominal pain in lower abdomen and RLQ, recently undergoing colonoscopy with negative segmental biopsies. History of erosive gastritis/duodenitis, duodenal ulcer, +h.pylori with documented eradication on file. Prior supportive treatment for diarrhea including dicyclomine, Imodium, and Levsin. She was provided Viberzi recently. Here for close follow-up.   Headaches with Viberzi 75 mg. Took with food. Went back to taking Imodium and Tylenol. Took multiple tylenol yesterday and today 4. Has pain and cramping in lower abdomen. Pain in RLQ. Worsened postprandially. Stool is watery, loose. Today has gone 4 times. Pain dies down small amount after BM but still persists.   CT on file from Jan 2021 with question of pelvic congestion syndrome. [place] Jan 2021 with 1.4 cm cystic collection related to prior D&C and endometrial ablation with focal and debris. She has seen GYN in the past for chronic abdominal pain.   Past Medical History:  Diagnosis Date  . Asthma   . Endometriosis   . Irregular periods 12/31/2014  . Pelvic pain in female 12/31/2014  . Pregnant 04/14/2015  . Second degree uterine prolapse 12/31/2014  . Seizures ([place])   . Vaginal Pap smear, abnormal     Past Surgical History:  Procedure Laterality Date  . BIOPSY  07/06/2019   Procedure: BIOPSY;  Surgeon: Daneil Dolin, MD;  Location: [facility];  Service: Endoscopy;;  colon   . COLONOSCOPY WITH PROPOFOL N/A 07/06/2019   one 3 mm polyp hyperplastic, segmental biopsies negative  . DILITATION & CURRETTAGE/HYSTROSCOPY WITH NOVASURE ABLATION N/A 10/08/2017   Procedure: DILATATION & CURETTAGE/HYSTEROSCOPY WITH MINERVA  ENDOMETRIAL  ABLATION (Procedure #2);  Surgeon: [name], MD;  Location: [facility];  Service: Gynecology;  Laterality: N/A;  . ESOPHAGOGASTRODUODENOSCOPY N/A 12/10/2014   SLF: hematoemesis due to moderate erosive gastritis, duoedentitis, and duodenal ulcer  . LEEP    . POLYPECTOMY  07/06/2019   Procedure: POLYPECTOMY;  Surgeon: Daneil Dolin, MD;  Location: [facility];  Service: Endoscopy;;  . surgery for endometriosis  2005  . TUBAL LIGATION Bilateral 10/08/2017   Procedure: BILATERAL TUBAL STERILIZATION WITH FALLOPE RINGS APPLICATION (Procedure #1);  Surgeon: [name], MD;  Location: [facility];  Service: Gynecology;  Laterality: Bilateral;    Current Outpatient Medications  Medication Sig Dispense Refill  . acetaminophen (TYLENOL) 500 MG tablet Take 500-1,500 mg by mouth every 6 (six) hours as needed (for pain.).     [name] loperamide (IMODIUM A-D) 2 MG tablet Take 2-4 mg by mouth 4 (four) times daily as needed for diarrhea or loose stools.     . hyoscyamine (LEVBID) 0.375 MG 12 hr tablet Take 1 tablet (0.375 mg total) by mouth 2 (two) times daily. 60 tablet 3  . rifaximin (XIFAXAN) 550 MG TABS tablet Take 1 tablet (550 mg total) by mouth 3 (three) times daily for 14 days. 42 tablet 0   No current facility-administered medications for this visit.    Allergies as of 07/22/2019 - Review Complete 07/22/2019  Allergen Reaction Noted  . Ibuprofen Anaphylaxis 01/01/2014  . Flexeril [cyclobenzaprine] Hives 12/25/2012    Family History  Problem Relation Age of Onset  . Cancer Mother  kidney   . Asthma Mother   . Hypertension Mother   . Colon polyps Sister        32, large polyp, done at [facility]   . Cancer Paternal Grandmother   . Asthma Son   . Colon cancer Neg Hx     Social History   Socioeconomic History  . Marital status: Single    Spouse name: Not on file  . Number of children: Not on file  . Years of education: Not on file  . Highest education level: Not on file    Occupational History  . Occupation: Housecleaning  Tobacco Use  . Smoking status: Former Smoker    Packs/day: 0.25    Years: 14.00    Pack years: 3.50    Types: Cigarettes  . Smokeless tobacco: Never Used  . Tobacco comment: quit in 2017   Substance and Sexual Activity  . Alcohol use: Yes    Alcohol/week: 0.0 standard drinks    Comment: occ  . Drug use: No  . Sexual activity: Not Currently    Birth control/protection: Surgical    Comment: tubal and ablation  Other Topics Concern  . Not on file  Social History Narrative  . Not on file   Social Determinants of Health   Financial Resource Strain:   . Difficulty of Paying Living Expenses:   Food Insecurity:   . Worried About Charity fundraiser in the Last Year:   . Arboriculturist in the Last Year:   Transportation Needs:   . Film/video editor (Medical):   [name] Lack of Transportation (Non-Medical):   Physical Activity:   . Days of Exercise per Week:   . Minutes of Exercise per Session:   Stress:   . Feeling of Stress :   Social Connections:   . Frequency of Communication with Friends and Family:   . Frequency of Social Gatherings with Friends and Family:   . Attends Religious Services:   . Active Member of Clubs or Organizations:   . Attends Archivist Meetings:   [name] Marital Status:     Review of Systems: Gen: Denies fever, chills, anorexia. Denies fatigue, weakness, weight loss.  CV: Denies chest pain, palpitations, syncope, peripheral edema, and claudication. Resp: Denies dyspnea at rest, cough, wheezing, coughing up blood, and pleurisy. GI: see HPI Derm: Denies rash, itching, dry skin Psych: Denies depression, anxiety, memory loss, confusion. No homicidal or suicidal ideation.  Heme: Denies bruising, bleeding, and enlarged lymph nodes.  Physical Exam: BP 108/75   Pulse 83   Temp (!) 97.3 F (36.3 C) (Temporal)   Ht 5\' 4"  (1.626 m)   Wt 159 lb 3.2 oz (72.2 kg)   BMI 27.33 kg/m  General:    Alert and oriented. No distress noted. Flat affect Head:  Normocephalic and atraumatic. Eyes:  Conjuctiva clear without scleral icterus. Mouth:  Mask in place Abdomen:  +BS, soft, TTP lower abdomen and non-distended. No rebound or guarding. No HSM or masses noted. Msk:  Symmetrical without gross deformities. Normal posture. Extremities:  Without edema. Neurologic:  Alert and  oriented x4 Psych:  Alert and cooperative. Normal mood and affect.  ASSESSMENT: Valerie Huber is a 34 y.o. female presenting today with chronic diarrhea, recent colonoscopy unrevealing, and no improvement with dicyclomine, Levsin, imodium, and most recent Viberzi 75 mg. She notes Viberzi caused headaches, and she is unable to tell me if it helped with diarrhea. Symptoms seem disproportionate to physical exam findings and  evaluation thus far. CT on file as well as pelvic ultrasound.  I'm not convinced abdominal pain is entirely related to only GI process; likely dealing with IBS-D, and it has been difficult to manage. Will refer her back to GYN to exclude any underlying etiologies. Add Levbid BID, trial of Xifaxan, and she is to call with progress report. Celiac serologies ordered for completeness' sake, although I doubt dealing with this. If she continues to have significant symptoms, may need tertiary evaluation.    PLAN:   Levbid BID  Xifaxan TID for 14 days  Call with progress report  Return to GYN for further assessment  Celiac serologies ordered  Further recommendations after progress report.  Annitta Needs, PhD, ANP-BC [facility]

## 2019-07-26 ENCOUNTER — Encounter: Payer: Self-pay | Admitting: Gastroenterology

## 2019-07-30 ENCOUNTER — Ambulatory Visit: Payer: Medicaid Other | Admitting: Obstetrics and Gynecology

## 2019-08-20 ENCOUNTER — Ambulatory Visit: Payer: Medicaid Other | Admitting: Gastroenterology

## 2019-12-14 ENCOUNTER — Encounter: Payer: Self-pay | Admitting: Obstetrics & Gynecology

## 2019-12-14 ENCOUNTER — Ambulatory Visit (INDEPENDENT_AMBULATORY_CARE_PROVIDER_SITE_OTHER): Payer: Medicaid Other | Admitting: Obstetrics & Gynecology

## 2019-12-14 VITALS — BP 135/92 | HR 95 | Ht 64.0 in | Wt 154.0 lb

## 2019-12-14 DIAGNOSIS — L0232 Furuncle of buttock: Secondary | ICD-10-CM

## 2019-12-14 MED ORDER — SULFAMETHOXAZOLE-TRIMETHOPRIM 800-160 MG PO TABS: 1.0000 | ORAL_TABLET | Freq: Two times a day (BID) | ORAL | 0 refills | Status: DC

## 2019-12-14 MED ORDER — SILVER SULFADIAZINE 1 % EX CREA: TOPICAL_CREAM | CUTANEOUS | 11 refills | Status: DC

## 2019-12-14 NOTE — Progress Notes (Signed)
Chief Complaint  Patient presents with  . knot on left and right inner bottom      34 y.o. [id number] Patient's last menstrual period was 12/10/2019. The current method of family planning is tubal ligation.  Outpatient Encounter Medications as of 12/14/2019  Medication Sig  . acetaminophen (TYLENOL) 500 MG tablet Take 500-1,500 mg by mouth every 6 (six) hours as needed (for pain.).   [name] loperamide (IMODIUM A-D) 2 MG tablet Take 2-4 mg by mouth 4 (four) times daily as needed for diarrhea or loose stools.   . silver sulfADIAZINE (SILVADENE) 1 % cream Apply to area TID  . [DISCONTINUED] hyoscyamine (LEVBID) 0.375 MG 12 hr tablet Take 1 tablet (0.375 mg total) by mouth 2 (two) times daily.  . [DISCONTINUED] sulfamethoxazole-trimethoprim (BACTRIM DS) 800-160 MG tablet Take 1 tablet by mouth 2 (two) times daily. (Patient not taking: Reported on 04/12/2020)   No facility-administered encounter medications on file as of 12/14/2019.    Subjective Patient has had a painful tender draining knot on the left and right inner buttock now for several days No fever No chills Some drainage She is not diabetic  Past Medical History:  Diagnosis Date  . Asthma   . 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 Endometriosis   . Irregular periods 12/31/2014  . Pelvic pain in female 12/31/2014  . Pregnant 04/14/2015  . Second degree uterine prolapse 12/31/2014  . Seizures ([place])   . Vaginal Pap smear, abnormal     Past Surgical History:  Procedure Laterality Date  . BIOPSY  07/06/2019   Procedure: BIOPSY;  Surgeon: Daneil Dolin, MD;  Location: [facility];  Service: Endoscopy;;  colon   . COLONOSCOPY WITH PROPOFOL N/A 07/06/2019   one 3 mm polyp hyperplastic, segmental biopsies negative  . DILITATION & CURRETTAGE/HYSTROSCOPY WITH NOVASURE ABLATION N/A 10/08/2017   Procedure: DILATATION & CURETTAGE/HYSTEROSCOPY WITH MINERVA  ENDOMETRIAL ABLATION (Procedure #2);  Surgeon: [name], MD;  Location: [facility];  Service: Gynecology;  Laterality: N/A;  . ESOPHAGOGASTRODUODENOSCOPY N/A 12/10/2014   SLF: hematoemesis due to moderate erosive gastritis, duoedentitis, and duodenal ulcer  . LEEP    . POLYPECTOMY  07/06/2019   Procedure: POLYPECTOMY;  Surgeon: Daneil Dolin, MD;  Location: [facility];  Service: Endoscopy;;  . surgery for endometriosis  2005  . TUBAL LIGATION Bilateral 10/08/2017   Procedure: BILATERAL TUBAL STERILIZATION WITH FALLOPE RINGS APPLICATION (Procedure #1);  Surgeon: [name], MD;  Location: [facility];  Service: Gynecology;  Laterality: Bilateral;    OB History    Gravida  3   Para  2   Term  2   Preterm      AB  1   Living  2     SAB      IAB  1   Ectopic      Multiple  0   Live Births  2           Allergies  Allergen Reactions  . Ibuprofen Anaphylaxis    Throat, eyes, lips swollen  . Flexeril [Cyclobenzaprine] Hives    Social History   Socioeconomic History  . Marital status: Single    Spouse name: Not on file  . Number of children: Not on file  . Years of education: Not on file  . Highest education level: Not on file  Occupational History  . Occupation: Housecleaning  Tobacco Use  . Smoking status: Former Smoker    Packs/day: 0.25  Years: 14.00    Pack years: 3.50    Types: Cigarettes  . Smokeless tobacco: Never Used  . Tobacco comment: quit in 2017   Vaping Use  . Vaping Use: Never used  Substance and Sexual Activity  . Alcohol use: Yes    Alcohol/week: 0.0 standard drinks    Comment: occ  . Drug use: No  . Sexual activity: Yes    Birth control/protection: Surgical    Comment: tubal and ablation  Other Topics Concern  . Not on file  Social History Narrative  . Not on file   Social Determinants of Health   Financial Resource Strain: Not on file  Food Insecurity: Not on file  Transportation Needs: Not on file  Physical Activity: Not on file  Stress: Not on file  Social Connections: Not on file     Family History  Problem Relation Age of Onset  . Cancer Mother        kidney   . Asthma Mother   . Hypertension Mother   . Colon polyps Sister        45, large polyp, done at [facility]   . Cancer Paternal Grandmother   . Asthma Son   . Colon cancer Neg Hx     Medications:       Current Outpatient Medications:  .  acetaminophen (TYLENOL) 500 MG tablet, Take 500-1,500 mg by mouth every 6 (six) hours as needed (for pain.). , Disp: , Rfl:  .  loperamide (IMODIUM A-D) 2 MG tablet, Take 2-4 mg by mouth 4 (four) times daily as needed for diarrhea or loose stools. , Disp: , Rfl:  .  DULoxetine (CYMBALTA) 30 MG capsule, Take 1 capsule (30 mg total) by mouth daily., Disp: 30 capsule, Rfl: 0 .  silver sulfADIAZINE (SILVADENE) 1 % cream, Apply to area TID, Disp: 50 g, Rfl: 11  Objective Blood pressure (!) 135/92, pulse 95, height 5\' 4"  (1.626 m), weight 154 lb (69.9 kg), last menstrual period 12/10/2019.  Boil on buttock is noted with limited erythema not actively draining  Pertinent ROS No burning with urination, frequency or urgency No nausea, vomiting or diarrhea Nor fever chills or other constitutional symptoms   Labs or studies     Impression Diagnoses this Encounter::   ICD-10-CM   1. Boil of buttock  L02.32     Established relevant diagnosis(es):   Plan/Recommendations: Meds ordered this encounter  Medications  . DISCONTD: sulfamethoxazole-trimethoprim (BACTRIM DS) 800-160 MG tablet    Sig: Take 1 tablet by mouth 2 (two) times daily.    Dispense:  20 tablet    Refill:  0  . silver sulfADIAZINE (SILVADENE) 1 % cream    Sig: Apply to area TID    Dispense:  50 g    Refill:  11    Labs or Scans Ordered: No orders of the defined types were placed in this encounter.   Management:: Bactrim and Silvadene follow-up for evaluation in a week and a half  Follow up Return in about 11 days (around 12/25/2019) for Follow up, with Dr Elonda Husky.        All questions were  answered.

## 2019-12-25 ENCOUNTER — Ambulatory Visit: Payer: Medicaid Other | Admitting: Obstetrics & Gynecology

## 2020-04-08 ENCOUNTER — Emergency Department (HOSPITAL_COMMUNITY): Payer: Medicaid Other

## 2020-04-08 ENCOUNTER — Emergency Department (HOSPITAL_COMMUNITY)
Admission: EM | Admit: 2020-04-08 | Discharge: 2020-04-08 | Disposition: A | Discharge status: Home / Self Care | Payer: Medicaid Other | Attending: Emergency Medicine | Admitting: Emergency Medicine

## 2020-04-08 ENCOUNTER — Other Ambulatory Visit: Payer: Self-pay

## 2020-04-08 ENCOUNTER — Encounter (HOSPITAL_COMMUNITY): Payer: Self-pay

## 2020-04-08 DIAGNOSIS — J45909 Unspecified asthma, uncomplicated: Secondary | ICD-10-CM | POA: Diagnosis not present

## 2020-04-08 DIAGNOSIS — R102 Pelvic and perineal pain: Secondary | ICD-10-CM | POA: Insufficient documentation

## 2020-04-08 DIAGNOSIS — Z87891 Personal history of nicotine dependence: Secondary | ICD-10-CM | POA: Diagnosis not present

## 2020-04-08 DIAGNOSIS — R1031 Right lower quadrant pain: Secondary | ICD-10-CM | POA: Insufficient documentation

## 2020-04-08 LAB — URINALYSIS, ROUTINE W REFLEX MICROSCOPIC
Bacteria, UA: NONE SEEN
Bilirubin Urine: NEGATIVE
Glucose, UA: NEGATIVE mg/dL
Ketones, ur: 20 mg/dL — AB
Leukocytes,Ua: NEGATIVE
Nitrite: NEGATIVE
Protein, ur: NEGATIVE mg/dL
Specific Gravity, Urine: 1.029 (ref 1.005–1.030)
pH: 6 (ref 5.0–8.0)

## 2020-04-08 LAB — COMPREHENSIVE METABOLIC PANEL WITH GFR
ALT: 13 U/L (ref 0–44)
AST: 13 U/L — ABNORMAL LOW (ref 15–41)
Albumin: 4.7 g/dL (ref 3.5–5.0)
Alkaline Phosphatase: 53 U/L (ref 38–126)
Anion gap: 10 (ref 5–15)
BUN: 15 mg/dL (ref 6–20)
CO2: 20 mmol/L — ABNORMAL LOW (ref 22–32)
Calcium: 9.4 mg/dL (ref 8.9–10.3)
Chloride: 106 mmol/L (ref 98–111)
Creatinine, Ser: 0.64 mg/dL (ref 0.44–1.00)
GFR, Estimated: >60 mL/min
Glucose, Bld: 103 mg/dL — ABNORMAL HIGH (ref 70–99)
Potassium: 3.8 mmol/L (ref 3.5–5.1)
Sodium: 136 mmol/L (ref 135–145)
Total Bilirubin: 0.5 mg/dL (ref 0.3–1.2)
Total Protein: 8.5 g/dL — ABNORMAL HIGH (ref 6.5–8.1)

## 2020-04-08 LAB — LIPASE, BLOOD: Lipase: 23 U/L (ref 11–51)

## 2020-04-08 LAB — CBC
HCT: 40.6 % (ref 36.0–46.0)
Hemoglobin: 13.4 g/dL (ref 12.0–15.0)
MCH: 31.9 pg (ref 26.0–34.0)
MCHC: 33 g/dL (ref 30.0–36.0)
MCV: 96.7 fL (ref 80.0–100.0)
Platelets: 334 10*3/uL (ref 150–400)
RBC: 4.2 MIL/uL (ref 3.87–5.11)
RDW: 12 % (ref 11.5–15.5)
WBC: 7.9 10*3/uL (ref 4.0–10.5)
nRBC: 0 % (ref 0.0–0.2)

## 2020-04-08 LAB — PREGNANCY, URINE: Preg Test, Ur: NEGATIVE

## 2020-04-08 MED ORDER — HYDROMORPHONE HCL 1 MG/ML IJ SOLN
1.0000 mg | Freq: Once | INTRAMUSCULAR | Status: AC
  Administered 2020-04-08: 1 mg via INTRAVENOUS
  Filled 2020-04-08: qty 1

## 2020-04-08 MED ORDER — ONDANSETRON HCL 4 MG/2ML IJ SOLN
4.0000 mg | Freq: Once | INTRAMUSCULAR | Status: AC
  Administered 2020-04-08: 4 mg via INTRAVENOUS
  Filled 2020-04-08: qty 2

## 2020-04-08 MED ORDER — HYDROCODONE-ACETAMINOPHEN 5-325 MG PO TABS: ORAL_TABLET | ORAL | 0 refills | Status: DC

## 2020-04-08 MED ORDER — IOHEXOL 300 MG/ML  SOLN
100.0000 mL | Freq: Once | INTRAMUSCULAR | Status: AC | PRN
  Administered 2020-04-08: 100 mL via INTRAVENOUS

## 2020-04-08 NOTE — ED Notes (Signed)
Patient transported to Ultrasound 

## 2020-04-08 NOTE — ED Triage Notes (Signed)
Pt to er, pt states that she is here for R lower quad pain, states that the pain started suddenly on Wednesday, states that it seems to have gotten worse since then.  Reports a hx of endometriosis, but states that this is different.  Denies vomiting or diarrhea.

## 2020-04-08 NOTE — ED Notes (Signed)
Pt ambulatory to waiting room. Pt verbalized understanding of discharge instructions.   

## 2020-04-08 NOTE — Discharge Instructions (Addendum)
As discussed, call [facility] on Monday to arrange a follow-up appointment.  You may apply heat packs on and off to your lower abdomen.  I have listed to local doctors for you to contact to establish primary care

## 2020-04-11 NOTE — ED Provider Notes (Signed)
[facility] EMERGENCY DEPARTMENT Provider Note   CSN: [id number] Arrival date & time: 04/08/20  1010     History Chief Complaint  Patient presents with  . Abdominal Cramping    Valerie Huber is a 34 y.o. female.  HPI     Valerie Huber is a 34 y.o. female who presents to the Emergency Department complaining of right lower quadrant abdominal pain that began 2 days ago.  Pain initially was mild and has progressed.  She describes the pain as sharp and nonradiating.  She reports history of endometriosis and frequent pelvic pain, but states this pain is different.  She denies fever, chills, back pain, dysuria, nausea or vomiting.  She is taken some pain medication that she had at home without relief.  No abnormal vaginal bleeding or discharge recently.  She has had ongoing pelvic pain without clear diagnosis and states she has not been sexually active for several months due to her chronic pelvic pain.    Past Medical History:  Diagnosis Date  . Asthma   . Endometriosis   . Irregular periods 12/31/2014  . Pelvic pain in female 12/31/2014  . Pregnant 04/14/2015  . Second degree uterine prolapse 12/31/2014  . Seizures ([place])   . Vaginal Pap smear, abnormal     Patient Active Problem List   Diagnosis Date Noted  . Abdominal pain 07/22/2019  . Heme positive stool 06/30/2019  . Chronic diarrhea 06/30/2019  . Diarrhea of presumed infectious origin   . Rectal bleeding   . Poor appetite   . Pelvic pain 02/20/2018  . Bilateral lower abdominal pain 02/20/2018  . Encounter for counseling regarding contraception 09/02/2017  . Complex regional pain syndrome type 1 of right lower extremity 05/20/2017  . 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 Active labor at term 12/02/2015  . Active labor 12/01/2015  . Decreased appetite 11/16/2015  . Insufficient prenatal care 10/25/2015  . Marijuana use 07/05/2015  . Moderate dysplasia of cervix (CIN II)  at endocervical margin of LEEP specimen  01/22/2015  . Pelvic pain in female 12/31/2014  . Endometriosis determined by laparoscopy 12/31/2014  . Second degree uterine prolapse 12/31/2014  . Irregular periods 12/31/2014  . 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 Hematemesis with nausea     Past Surgical History:  Procedure Laterality Date  . BIOPSY  07/06/2019   Procedure: BIOPSY;  Surgeon: Daneil Dolin, MD;  [facility];  Service: Endoscopy;;  colon   . COLONOSCOPY WITH PROPOFOL N/A 07/06/2019   one 3 mm polyp hyperplastic, segmental biopsies negative  . DILITATION & CURRETTAGE/HYSTROSCOPY WITH NOVASURE ABLATION N/A 10/08/2017   Procedure: DILATATION & CURETTAGE/HYSTEROSCOPY WITH MINERVA  ENDOMETRIAL ABLATION (Procedure #2);  Surgeon: [name], MD;  Location: [facility];  Service: Gynecology;  Laterality: N/A;  . ESOPHAGOGASTRODUODENOSCOPY N/A 12/10/2014   SLF: hematoemesis due to moderate erosive gastritis, duoedentitis, and duodenal ulcer  . LEEP    . POLYPECTOMY  07/06/2019   Procedure: POLYPECTOMY;  Surgeon: Daneil Dolin, MD;  Location: [facility];  Service: Endoscopy;;  . surgery for endometriosis  2005  . TUBAL LIGATION Bilateral 10/08/2017   Procedure: BILATERAL TUBAL STERILIZATION WITH FALLOPE RINGS APPLICATION (Procedure #1);  Surgeon: [name], MD;  Location: [facility];  Service: Gynecology;  Laterality: Bilateral;     OB History    Gravida  3   Para  2   Term  2   Preterm      AB  1  Living  2     SAB      TAB  1   Ectopic      Multiple  0   Live Births  2           Family History  Problem Relation Age of Onset  . Cancer Mother        kidney   . Asthma Mother   . Hypertension Mother   . Colon polyps Sister        70, large polyp, done at [facility]   . Cancer Paternal Grandmother   . Asthma Son   . Colon cancer Neg Hx     Social History   Tobacco Use  . Smoking status: Former Smoker    Packs/day: 0.25    Years: 14.00    Pack years: 3.50     Types: Cigarettes  . Smokeless tobacco: Never Used  . Tobacco comment: quit in 2017   Vaping Use  . Vaping Use: Never used  Substance Use Topics  . Alcohol use: Yes    Alcohol/week: 0.0 standard drinks    Comment: occ  . Drug use: No    Home Medications Prior to Admission medications   Medication Sig Start Date End Date Taking? Authorizing Provider  acetaminophen (TYLENOL) 500 MG tablet Take 500-1,500 mg by mouth every 6 (six) hours as needed (for pain.).     [provider]  HYDROcodone-acetaminophen (NORCO/VICODIN) 5-325 MG tablet Take one tab po q 4 hrs prn pain 04/08/20   Vaishnav Demartin, PA-C  loperamide (IMODIUM A-D) 2 MG tablet Take 2-4 mg by mouth 4 (four) times daily as needed for diarrhea or loose stools.     [provider]  silver sulfADIAZINE (SILVADENE) 1 % cream Apply to area TID 12/14/19   Florian Buff, MD  sulfamethoxazole-trimethoprim (BACTRIM DS) 800-160 MG tablet Take 1 tablet by mouth 2 (two) times daily. 12/14/19   Florian Buff, MD    Allergies    Ibuprofen and Flexeril [cyclobenzaprine]  Review of Systems   Review of Systems  Constitutional: Negative for chills, fatigue and fever.  HENT: Negative for sore throat.   Respiratory: Negative for shortness of breath.   Cardiovascular: Negative for chest pain and palpitations.  Gastrointestinal: Positive for abdominal pain. Negative for abdominal distention, diarrhea, nausea and vomiting.  Genitourinary: Positive for pelvic pain. Negative for decreased urine volume, dysuria, flank pain, hematuria, vaginal bleeding, vaginal discharge and vaginal pain.  Musculoskeletal: Negative for arthralgias, back pain, myalgias, neck pain and neck stiffness.  Skin: Negative for rash and wound.  Neurological: Negative for dizziness, weakness, numbness and headaches.  Hematological: Does not bruise/bleed easily.    Physical Exam Updated Vital Signs BP 120/86   Pulse 80   Temp 98.6 F (37 C) (Oral)   Resp  16   Ht 5\' 4"  (1.626 m)   Wt 70.3 kg   SpO2 100%   BMI 26.61 kg/m   Physical Exam Vitals and nursing note reviewed.  Constitutional:      General: She is not in acute distress.    Appearance: Normal appearance. She is not ill-appearing.     Comments: Patient tearful, appears uncomfortable  HENT:     Mouth/Throat:     Mouth: Mucous membranes are moist.  Neck:     Thyroid: No thyromegaly.     Meningeal: Kernig's sign absent.  Cardiovascular:     Rate and Rhythm: Normal rate and regular rhythm.     Pulses: Normal  pulses.  Pulmonary:     Effort: Pulmonary effort is normal.     Breath sounds: Normal breath sounds. No wheezing.  Chest:     Chest wall: No tenderness.  Abdominal:     General: There is no distension.     Palpations: Abdomen is soft.     Tenderness: There is no abdominal tenderness. There is no right CVA tenderness, left CVA tenderness, guarding or rebound.     Comments: Patient grimacing on exam.  Mild guarding of the right lower quadrant.  Tender to palpation of the right lower quadrant.  No CVA tenderness.  Abdomen is soft  Musculoskeletal:        General: Normal range of motion.     Right lower leg: No edema.     Left lower leg: No edema.  Skin:    General: Skin is warm.     Findings: No rash.  Neurological:     General: No focal deficit present.     Mental Status: She is alert.     Sensory: No sensory deficit.     Motor: No weakness.     ED Results / Procedures / Treatments   Labs (all labs ordered are listed, but only abnormal results are displayed) Labs Reviewed  URINALYSIS, ROUTINE W REFLEX MICROSCOPIC - Abnormal; Notable for the following components:      Result Value   Hgb urine dipstick SMALL (*)    Ketones, ur 20 (*)    All other components within normal limits  COMPREHENSIVE METABOLIC PANEL - Abnormal; Notable for the following components:   CO2 20 (*)    Glucose, Bld 103 (*)    Total Protein 8.5 (*)    AST 13 (*)    All other  components within normal limits  PREGNANCY, URINE  LIPASE, BLOOD  CBC    EKG None  Radiology No results found.  Procedures Procedures (including critical care time)  Medications Ordered in ED Medications  HYDROmorphone (DILAUDID) injection 1 mg (1 mg Intravenous Given 04/08/20 1306)  ondansetron (ZOFRAN) injection 4 mg (4 mg Intravenous Given 04/08/20 1306)  iohexol (OMNIPAQUE) 300 MG/ML solution 100 mL (100 mLs Intravenous Contrast Given 04/08/20 1520)    ED Course  I have reviewed the triage vital signs and the nursing notes.  Pertinent labs & imaging results that were available during my care of the patient were reviewed by me and considered in my medical decision making (see chart for details).    MDM Rules/Calculators/A&P                           Patient here for evaluation of right lower quadrant pain for 2 days.  Gradually worsening.  She is tearful and appears uncomfortable on initial exam.  Has history of recurrent pelvic pain and endometriosis.  Denies possible pregnancy.  Given exam and level of pain, I am concerned of ovarian torsion.  Will obtain pelvic ultrasound and labs  On recheck, patient has been given IV pain medication and pain has greatly improved.  Vitals reassuring.  Pelvic ultrasound without evidence of acute findings or ovarian torsion.  She still has her appendix so will proceed with CT imaging of the abdomen and pelvis for further evaluation of possible appendicitis.  Labs without significant findings, pregnancy negative CT abdomen pelvis also without acute findings.  Source of patient's right lower quadrant pain unclear.  Doubt emergent process.  Doubtful of STD given patient is currently abstinent  so pelvic exam deferred.  Patient is feeling better and pain has improved.  I feel that she is appropriate for discharge home, she does have OB/GYN follow-up.  Return precautions were discussed.  Final Clinical Impression(s) / ED Diagnoses Final  diagnoses:  RLQ abdominal pain    Rx / DC Orders ED Discharge Orders         Ordered    HYDROcodone-acetaminophen (NORCO/VICODIN) 5-325 MG tablet        04/08/20 1650           Kem Parkinson, PA-C 04/11/20 1421    Milton Ferguson, MD 04/12/20 [phone]

## 2020-04-12 ENCOUNTER — Ambulatory Visit: Payer: Medicaid Other | Admitting: Adult Health

## 2020-04-12 ENCOUNTER — Encounter: Payer: Self-pay | Admitting: Internal Medicine

## 2020-04-12 ENCOUNTER — Other Ambulatory Visit: Payer: Self-pay

## 2020-04-12 ENCOUNTER — Ambulatory Visit (INDEPENDENT_AMBULATORY_CARE_PROVIDER_SITE_OTHER): Payer: Medicaid Other | Admitting: Internal Medicine

## 2020-04-12 VITALS — BP 134/86 | HR 91 | Temp 97.9°F | Resp 18 | Ht 64.0 in | Wt 166.4 lb

## 2020-04-12 DIAGNOSIS — R102 Pelvic and perineal pain: Secondary | ICD-10-CM | POA: Diagnosis not present

## 2020-04-12 DIAGNOSIS — K529 Noninfective gastroenteritis and colitis, unspecified: Secondary | ICD-10-CM

## 2020-04-12 DIAGNOSIS — Z7689 Persons encountering health services in other specified circumstances: Secondary | ICD-10-CM | POA: Diagnosis not present

## 2020-04-12 DIAGNOSIS — Z09 Encounter for follow-up examination after completed treatment for conditions other than malignant neoplasm: Secondary | ICD-10-CM | POA: Diagnosis not present

## 2020-04-12 MED ORDER — TRAMADOL HCL 50 MG PO TABS: 50.0000 mg | ORAL_TABLET | Freq: Two times a day (BID) | ORAL | 0 refills | Status: AC | PRN

## 2020-04-12 MED ORDER — DULOXETINE HCL 30 MG PO CPEP: 30.0000 mg | ORAL_CAPSULE | Freq: Every day | ORAL | 0 refills | Status: DC

## 2020-04-12 NOTE — Assessment & Plan Note (Signed)
Recent ER visit for acute on chronic pelvic pain ER records and summary reviewed Imaging and lab tests reviewed Medications discussed with the patient in detail

## 2020-04-12 NOTE — Assessment & Plan Note (Signed)
Care established Previous chart reviewed History and medications reviewed with the patient 

## 2020-04-12 NOTE — Patient Instructions (Signed)
Please take Tramadol only for moderate to severe pain. Okay to take Tylenol in between up to 4 times in a day.  Please start taking Duloxetine as prescribed.  Please follow up with OB/GYN for evaluation of pelvic pain.

## 2020-04-12 NOTE — Assessment & Plan Note (Signed)
Acute on chronic CT abdomen showing pelvic congestion syndrome H/o endometriosis s/p surgical repair Advised to follow up with OB/GYN Prescribed Tramadol and Cymbalta

## 2020-04-12 NOTE — Progress Notes (Signed)
New Patient Office Visit  Subjective:  Patient ID: Valerie Huber, female    DOB: Jun 27, 1985  Age: 34 y.o. MRN: [id number]  CC:  Chief Complaint  Patient presents with  . New Patient (Initial Visit)    new pt was sent from ER was there Friday right side pain pt stated this has been going on for year and half she has seen dr Glo Herring also they tell her it is scar tissue     HPI Valerie Huber is a 34 year old female with PMH of chronic pelvic pain, IBS-D and endometriosis s/p surgical repair who presents for establishing care.  Patient went to emergency room 4 days ago for acute on chronic right-sided pelvic pain.  Of note, she has had extensive work-up for abdominal/pelvic pain in the past.  Last CT scan of the abdomen/pelvis in the ER showed pelvic congestion syndrome as seen before.  She was given Dilaudid in the ER and was prescribed Percocet and was told to follow-up in the outpatient setting.  She continues to have right-sided pelvic pain, which is constant, about 8-10 out of 10, nonradiating, with no relationship with menstrual cycles.  Of note, patient is currently menstruating, day 1.  She has a history of endometriosis s/p surgical repair many years ago.  She has completed her Percocet and has been taking Tylenol about 4 times in a day.  She has a follow-up appointment with her OB/GYN in the next week.  She also follows up with GI for chronic diarrhea, for which she used to take loperamide mainly for IBS-D.  She has had colonoscopy in the past.  Last Pap smear in 10/2017.  Patient has not had Covid or flu vaccine yet.  Past Medical History:  Diagnosis Date  . Asthma   . 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 Endometriosis   . Irregular periods 12/31/2014  . Pelvic pain in female 12/31/2014  . Pregnant 04/14/2015  . Second degree uterine prolapse 12/31/2014  . Seizures ([place])   . Vaginal Pap smear, abnormal     Past Surgical History:  Procedure Laterality Date  . BIOPSY   07/06/2019   Procedure: BIOPSY;  Surgeon: Daneil Dolin, MD;  [facility];  Service: Endoscopy;;  colon   . COLONOSCOPY WITH PROPOFOL N/A 07/06/2019   one 3 mm polyp hyperplastic, segmental biopsies negative  . DILITATION & CURRETTAGE/HYSTROSCOPY WITH NOVASURE ABLATION N/A 10/08/2017   Procedure: DILATATION & CURETTAGE/HYSTEROSCOPY WITH MINERVA  ENDOMETRIAL ABLATION (Procedure #2);  Surgeon: [name], MD;  Location: [facility];  Service: Gynecology;  Laterality: N/A;  . ESOPHAGOGASTRODUODENOSCOPY N/A 12/10/2014   SLF: hematoemesis due to moderate erosive gastritis, duoedentitis, and duodenal ulcer  . LEEP    . POLYPECTOMY  07/06/2019   Procedure: POLYPECTOMY;  Surgeon: Daneil Dolin, MD;  [facility];  Service: Endoscopy;;  . surgery for endometriosis  2005  . TUBAL LIGATION Bilateral 10/08/2017   Procedure: BILATERAL TUBAL STERILIZATION WITH FALLOPE RINGS APPLICATION (Procedure #1);  Surgeon: [name], MD;  Location: [facility];  Service: Gynecology;  Laterality: Bilateral;    Family History  Problem Relation Age of Onset  . Cancer Mother        kidney   . Asthma Mother   . Hypertension Mother   . Colon polyps Sister        35, large polyp, done at [facility]   . Cancer Paternal Grandmother   . Asthma Son   . Colon cancer Neg Hx  Social History   Socioeconomic History  . Marital status: Single    Spouse name: Not on file  . Number of children: Not on file  . Years of education: Not on file  . Highest education level: Not on file  Occupational History  . Occupation: Housecleaning  Tobacco Use  . Smoking status: Former Smoker    Packs/day: 0.25    Years: 14.00    Pack years: 3.50    Types: Cigarettes  . Smokeless tobacco: Never Used  . Tobacco comment: quit in 2017   Vaping Use  . Vaping Use: Never used  Substance and Sexual Activity  . Alcohol use: Yes    Alcohol/week: 0.0 standard drinks    Comment: occ  . Drug use: No  . Sexual  activity: Yes    Birth control/protection: Surgical    Comment: tubal and ablation  Other Topics Concern  . Not on file  Social History Narrative  . Not on file   Social Determinants of Health   Financial Resource Strain:   . Difficulty of Paying Living Expenses: Not on file  Food Insecurity:   . Worried About Charity fundraiser in the Last Year: Not on file  . Ran Out of Food in the Last Year: Not on file  Transportation Needs:   . Lack of Transportation (Medical): Not on file  . Lack of Transportation (Non-Medical): Not on file  Physical Activity:   . Days of Exercise per Week: Not on file  . Minutes of Exercise per Session: Not on file  Stress:   . Feeling of Stress : Not on file  Social Connections:   . Frequency of Communication with Friends and Family: Not on file  . Frequency of Social Gatherings with Friends and Family: Not on file  . Attends Religious Services: Not on file  . Active Member of Clubs or Organizations: Not on file  . Attends Archivist Meetings: Not on file  . Marital Status: Not on file  Intimate Partner Violence:   . Fear of Current or Ex-Partner: Not on file  . Emotionally Abused: Not on file  . Physically Abused: Not on file  . Sexually Abused: Not on file    ROS Review of Systems  Constitutional: Negative for chills and fever.  HENT: Negative for congestion, sinus pressure, sinus pain and sore throat.   Eyes: Negative for pain and discharge.  Respiratory: Negative for cough and shortness of breath.   Cardiovascular: Negative for chest pain and palpitations.  Gastrointestinal: Positive for diarrhea. Negative for abdominal pain, constipation, nausea and vomiting.  Endocrine: Negative for polydipsia and polyuria.  Genitourinary: Positive for pelvic pain. Negative for dysuria, frequency, hematuria, vaginal bleeding, vaginal discharge and vaginal pain.  Musculoskeletal: Negative for neck pain and neck stiffness.  Skin: Negative for  rash.  Neurological: Negative for dizziness, syncope, weakness and headaches.  Psychiatric/Behavioral: Negative for agitation, behavioral problems and suicidal ideas.    Objective:   Today's Vitals: BP 134/86 (BP Location: Right Arm, Patient Position: Sitting, Cuff Size: Normal)   Pulse 91   Temp 97.9 F (36.6 C) (Temporal)   Resp 18   Ht 5\' 4"  (1.626 m)   Wt 166 lb 6.4 oz (75.5 kg)   SpO2 96%   BMI 28.56 kg/m   Physical Exam Vitals reviewed.  Constitutional:      General: She is not in acute distress.    Appearance: She is not diaphoretic.  HENT:     Head:  Normocephalic and atraumatic.     Nose: Nose normal.     Mouth/Throat:     Mouth: Mucous membranes are moist.  Eyes:     General: No scleral icterus.    Extraocular Movements: Extraocular movements intact.     Pupils: Pupils are equal, round, and reactive to light.  Cardiovascular:     Rate and Rhythm: Normal rate and regular rhythm.     Pulses: Normal pulses.     Heart sounds: No murmur heard.   Pulmonary:     Breath sounds: Normal breath sounds. No wheezing or rales.  Abdominal:     General: There is no distension.     Palpations: Abdomen is soft.     Tenderness: There is abdominal tenderness (RLL and right pelvic area, grimacing on palpation). There is no right CVA tenderness, left CVA tenderness, guarding or rebound.  Musculoskeletal:     Cervical back: Neck supple. No tenderness.     Right lower leg: No edema.     Left lower leg: No edema.  Skin:    General: Skin is warm.     Findings: No rash.  Neurological:     General: No focal deficit present.     Mental Status: She is alert and oriented to person, place, and time.  Psychiatric:        Thought Content: Thought content normal.        Judgment: Judgment normal.     Assessment & Plan:   Problem List Items Addressed This Visit      Encounter to establish care - Primary   Care established Previous chart reviewed History and medications reviewed  with the patient       Digestive   Chronic diarrhea    Follows up with GI for IBS-D Used to take Loperamide, not taking currently        Other   Pelvic pain in female    Acute on chronic CT abdomen showing pelvic congestion syndrome H/o endometriosis s/p surgical repair Advised to follow up with OB/GYN, will discuss about diagnostic laparoscopy Prescribed Tramadol and Cymbalta      Relevant Medications   traMADol (ULTRAM) 50 MG tablet   DULoxetine (CYMBALTA) 30 MG capsule         Encounter for examination following treatment at hospital    Recent ER visit for acute on chronic pelvic pain ER records and summary reviewed Imaging and lab tests reviewed Medications discussed with the patient in detail         Outpatient Encounter Medications as of 04/12/2020  Medication Sig  . acetaminophen (TYLENOL) 500 MG tablet Take 500-1,500 mg by mouth every 6 (six) hours as needed (for pain.).   [name] loperamide (IMODIUM A-D) 2 MG tablet Take 2-4 mg by mouth 4 (four) times daily as needed for diarrhea or loose stools.   . silver sulfADIAZINE (SILVADENE) 1 % cream Apply to area TID  . DULoxetine (CYMBALTA) 30 MG capsule Take 1 capsule (30 mg total) by mouth daily.  . traMADol (ULTRAM) 50 MG tablet Take 1 tablet (50 mg total) by mouth 2 (two) times daily as needed for up to 8 days.  . [DISCONTINUED] HYDROcodone-acetaminophen (NORCO/VICODIN) 5-325 MG tablet Take one tab po q 4 hrs prn pain (Patient not taking: Reported on 04/12/2020)  . [DISCONTINUED] sulfamethoxazole-trimethoprim (BACTRIM DS) 800-160 MG tablet Take 1 tablet by mouth 2 (two) times daily. (Patient not taking: Reported on 04/12/2020)   No facility-administered encounter medications on file as of 04/12/2020.  Follow-up: Return in about 3 months (around 07/11/2020).   Lindell Spar, MD

## 2020-04-12 NOTE — Assessment & Plan Note (Signed)
Follows up with GI for IBS-D Used to take Loperamide, not taking currently

## 2020-04-21 ENCOUNTER — Ambulatory Visit: Payer: Medicaid Other | Admitting: Adult Health

## 2020-04-29 ENCOUNTER — Emergency Department (HOSPITAL_COMMUNITY)
Admission: EM | Admit: 2020-04-29 | Discharge: 2020-04-29 | Disposition: A | Discharge status: Home / Self Care | Payer: Medicaid Other | Attending: Emergency Medicine | Admitting: Emergency Medicine

## 2020-04-29 ENCOUNTER — Other Ambulatory Visit: Payer: Self-pay

## 2020-04-29 ENCOUNTER — Ambulatory Visit: Payer: Self-pay | Admitting: *Deleted

## 2020-04-29 ENCOUNTER — Encounter (HOSPITAL_COMMUNITY): Payer: Self-pay

## 2020-04-29 DIAGNOSIS — J45909 Unspecified asthma, uncomplicated: Secondary | ICD-10-CM | POA: Insufficient documentation

## 2020-04-29 DIAGNOSIS — Z87891 Personal history of nicotine dependence: Secondary | ICD-10-CM | POA: Insufficient documentation

## 2020-04-29 DIAGNOSIS — H5711 Ocular pain, right eye: Secondary | ICD-10-CM | POA: Diagnosis not present

## 2020-04-29 MED ORDER — TETRACAINE HCL 0.5 % OP SOLN
2.0000 [drp] | Freq: Once | OPHTHALMIC | Status: AC
  Administered 2020-04-29: 2 [drp] via OPHTHALMIC
  Filled 2020-04-29: qty 4

## 2020-04-29 MED ORDER — FLUORESCEIN SODIUM 1 MG OP STRP
1.0000 | ORAL_STRIP | Freq: Once | OPHTHALMIC | Status: AC
  Administered 2020-04-29: 1 via OPHTHALMIC
  Filled 2020-04-29: qty 1

## 2020-04-29 NOTE — Telephone Encounter (Signed)
Pt called in but the line disconnected before talking with her.

## 2020-04-29 NOTE — ED Provider Notes (Addendum)
[facility] EMERGENCY DEPARTMENT Provider Note   CSN: [id number] Arrival date & time: 04/29/20  [id number]     History Chief Complaint  Patient presents with  . Eye Problem    Valerie Huber is a 34 y.o. female.  Patient with complaint of right eye pain and blurry vision for 3 days. This is happened on and off always in the same eye for about 8 months. This time vision was worse than usual. So patient became concerned and came in. Patient does not wear contacts does not wear glasses. Denies anything getting in the eye, left eye is fine        Past Medical History:  Diagnosis Date  . Asthma   . 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 Endometriosis   . Irregular periods 12/31/2014  . Pelvic pain in female 12/31/2014  . Pregnant 04/14/2015  . Second degree uterine prolapse 12/31/2014  . Seizures ([place])   . Vaginal Pap smear, abnormal     Patient Active Problem List   Diagnosis Date Noted  . Encounter to establish care 04/12/2020  . Encounter for examination following treatment at hospital 04/12/2020  . Heme positive stool 06/30/2019  . Chronic diarrhea 06/30/2019  . Rectal bleeding   . Complex regional pain syndrome type 1 of right lower extremity 05/20/2017  . Depression 11/30/2015  . H/O: substance abuse ([place]) 11/30/2015  . History of seizures 11/30/2015  . Marijuana use 07/05/2015  . Moderate dysplasia of cervix (CIN II)  at endocervical margin of LEEP specimen 01/22/2015  . Pelvic pain in female 12/31/2014  . Endometriosis determined by laparoscopy 12/31/2014  . Second degree uterine prolapse 12/31/2014  . Irregular periods 12/31/2014  . 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 Hematemesis with nausea   . Anaphylaxis 09/04/2011    Past Surgical History:  Procedure Laterality Date  . BIOPSY  07/06/2019   Procedure: BIOPSY;  Surgeon: Daneil Dolin, MD;  [facility];  Service: Endoscopy;;  colon   . COLONOSCOPY WITH PROPOFOL N/A 07/06/2019   one 3  mm polyp hyperplastic, segmental biopsies negative  . DILITATION & CURRETTAGE/HYSTROSCOPY WITH NOVASURE ABLATION N/A 10/08/2017   Procedure: DILATATION & CURETTAGE/HYSTEROSCOPY WITH MINERVA  ENDOMETRIAL ABLATION (Procedure #2);  Surgeon: [name], MD;  Location: [facility];  Service: Gynecology;  Laterality: N/A;  . ESOPHAGOGASTRODUODENOSCOPY N/A 12/10/2014   SLF: hematoemesis due to moderate erosive gastritis, duoedentitis, and duodenal ulcer  . LEEP    . POLYPECTOMY  07/06/2019   Procedure: POLYPECTOMY;  Surgeon: Daneil Dolin, MD;  [facility];  Service: Endoscopy;;  . surgery for endometriosis  2005  . TUBAL LIGATION Bilateral 10/08/2017   Procedure: BILATERAL TUBAL STERILIZATION WITH FALLOPE RINGS APPLICATION (Procedure #1);  Surgeon: [name], MD;  Location: [facility];  Service: Gynecology;  Laterality: Bilateral;     OB History    Gravida  3   Para  2   Term  2   Preterm      AB  1   Living  2     SAB      IAB  1   Ectopic      Multiple  0   Live Births  2           Family History  Problem Relation Age of Onset  . Cancer Mother        kidney   . Asthma Mother   . Hypertension Mother   . Colon polyps Sister  36, large polyp, done at [facility]   . Cancer Paternal Grandmother   . Asthma Son   . Colon cancer Neg Hx     Social History   Tobacco Use  . Smoking status: Former Smoker    Packs/day: 0.25    Years: 14.00    Pack years: 3.50    Types: Cigarettes  . Smokeless tobacco: Never Used  . Tobacco comment: quit in 2017   Vaping Use  . Vaping Use: Never used  Substance Use Topics  . Alcohol use: Yes    Alcohol/week: 0.0 standard drinks    Comment: occ  . Drug use: No    Home Medications Prior to Admission medications   Medication Sig Start Date End Date Taking? Authorizing Provider  acetaminophen (TYLENOL) 500 MG tablet Take 500-1,500 mg by mouth every 6 (six) hours as needed (for pain.).     [provider]   DULoxetine (CYMBALTA) 30 MG capsule Take 1 capsule (30 mg total) by mouth daily. 04/12/20   Lindell Spar, MD  loperamide (IMODIUM A-D) 2 MG tablet Take 2-4 mg by mouth 4 (four) times daily as needed for diarrhea or loose stools.     [provider]  silver sulfADIAZINE (SILVADENE) 1 % cream Apply to area TID 12/14/19   Florian Buff, MD    Allergies    Ibuprofen and Flexeril [cyclobenzaprine]  Review of Systems   Review of Systems  Constitutional: Negative for chills and fever.  HENT: Negative for congestion, rhinorrhea and sore throat.   Eyes: Positive for pain and visual disturbance.  Respiratory: Negative for cough and shortness of breath.   Cardiovascular: Negative for chest pain and leg swelling.  Gastrointestinal: Negative for abdominal pain, diarrhea, nausea and vomiting.  Genitourinary: Negative for dysuria.  Musculoskeletal: Negative for back pain and neck pain.  Skin: Negative for rash.  Neurological: Negative for dizziness, light-headedness and headaches.  Hematological: Does not bruise/bleed easily.  Psychiatric/Behavioral: Negative for confusion.    Physical Exam Updated Vital Signs BP 120/72   Pulse 71   Temp 99.9 F (37.7 C) (Oral)   Resp 16   SpO2 100%   Physical Exam Vitals and nursing note reviewed.  Constitutional:      General: She is not in acute distress.    Appearance: She is well-developed and well-nourished.  HENT:     Head: Normocephalic and atraumatic.  Eyes:     General: No scleral icterus.       Right eye: Discharge present.        Left eye: Discharge present.    Extraocular Movements: Extraocular movements intact.     Conjunctiva/sclera: Conjunctivae normal.     Pupils: Pupils are equal, round, and reactive to light.     Comments: Fluorescein stain to the right eye cornea normal. Ocular pressures in the right eye are reading at 35 with a 95% confidence. Tono-Pen stop malfunctioning so was not able to get a comparison ocular  pressure in the left eye.  Cardiovascular:     Rate and Rhythm: Normal rate and regular rhythm.     Heart sounds: No murmur heard.   Pulmonary:     Effort: Pulmonary effort is normal. No respiratory distress.     Breath sounds: Normal breath sounds.  Abdominal:     Palpations: Abdomen is soft.     Tenderness: There is no abdominal tenderness.  Musculoskeletal:        General: No edema.     Cervical back:  Neck supple.  Skin:    General: Skin is warm and dry.  Neurological:     General: No focal deficit present.     Mental Status: She is alert. Mental status is at baseline. She is disoriented.     Cranial Nerves: No cranial nerve deficit.     Sensory: No sensory deficit.  Psychiatric:        Mood and Affect: Mood and affect normal.     ED Results / Procedures / Treatments   Labs (all labs ordered are listed, but only abnormal results are displayed) Labs Reviewed - No data to display  EKG None  Radiology No results found.  Procedures Procedures (including critical care time)  Medications Ordered in ED Medications  fluorescein ophthalmic strip 1 strip (1 strip Right Eye Given by Other 04/29/20 0836)  tetracaine (PONTOCAINE) 0.5 % ophthalmic solution 2 drop (2 drops Both Eyes Given by Other 04/29/20 5176)    ED Course  I have reviewed the triage vital signs and the nursing notes.  Pertinent labs & imaging results that were available during my care of the patient were reviewed by me and considered in my medical decision making (see chart for details).    MDM Rules/Calculators/A&P                          Right eye seems to have increased ocular pressure 35 mm. With 95% accuracy. Left eye not able to check because a Tono-Pen started to malfunction and could not get accurate readings. But did seem to get an accurate reading in the right eye. Fluorescein staining of the right eye without any corneal abnormalities no evidence of any foreign bodies. No evidence of any lid  infection.  We will discuss with ophthalmology for guidance on treatment for increased ocular pressure. This may be explaining what is been going on for the past 8 months. On and off.   Patient just notified of a school emergency with her child. So she has to leave. Have given her on-call ophthalmology's number. She has to leave immediately. Instructed patient to give ophthalmology a call. I pry will be talking to them on the phone about this patient   Correction we had the wrong ophthalmologist they changed over it 8.  Is Dr. Eulas Post.  I spoke to Dr. Eulas Post on the phone.  Did give him patient's name and mobile number.  He will contact her for follow-up.  Final Clinical Impression(s) / ED Diagnoses Final diagnoses:  Pain of right eye    Rx / DC Orders ED Discharge Orders    None       Fredia Sorrow, MD 04/29/20 1607    Fredia Sorrow, MD 04/29/20 [phone]

## 2020-04-29 NOTE — Telephone Encounter (Signed)
I called pt back after after our line got disconnected.   "i'm being registered now".   I could hear the person in the background asking her registration information. She thanked me for calling back.

## 2020-04-29 NOTE — ED Notes (Signed)
Pt had to leave quickly for an emergency with family. Able to give AVS, but unable to get vitals or sign

## 2020-04-29 NOTE — ED Triage Notes (Addendum)
Pt reports that her right eye has ben blurry for 3 days. Initially felt like small pebble in eye, now completely blurry. Sore around corners of eye Pt reports spraying under carpet with lysol when this started

## 2020-04-29 NOTE — Discharge Instructions (Addendum)
Patient has to leave because of medical emergency with family. I would give ophthalmology a call sometime today. I will probably be talking to them on the phone. Information provided above.

## 2020-05-02 ENCOUNTER — Telehealth: Payer: Self-pay

## 2020-05-02 NOTE — Telephone Encounter (Signed)
Transition Care Management Unsuccessful Follow-up Telephone Call  Date of discharge and from where:  [name] on 04/29/2020  Attempts:  1st Attempt  Reason for unsuccessful TCM follow-up call:  Left voice message

## 2020-05-03 NOTE — Telephone Encounter (Signed)
Transition Care Management Unsuccessful Follow-up Telephone Call  Date of discharge and from where:  [name] on 04/29/2020  Attempts:  2nd Attempt  Reason for unsuccessful TCM follow-up call:  Left voice message

## 2020-05-04 NOTE — Telephone Encounter (Signed)
Transition Care Management Unsuccessful Follow-up Telephone Call  Date of discharge and from where:  04/29/2020 [name]  Attempts:  3rd Attempt  Reason for unsuccessful TCM follow-up call:  Left voice message

## 2020-05-09 ENCOUNTER — Other Ambulatory Visit: Payer: Self-pay | Admitting: Internal Medicine

## 2020-05-09 DIAGNOSIS — R102 Pelvic and perineal pain: Secondary | ICD-10-CM

## 2020-05-17 ENCOUNTER — Ambulatory Visit: Payer: Medicaid Other | Admitting: Obstetrics & Gynecology

## 2020-06-03 ENCOUNTER — Emergency Department (HOSPITAL_COMMUNITY)
Admission: EM | Admit: 2020-06-03 | Discharge: 2020-06-03 | Disposition: A | Discharge status: Home / Self Care | Payer: Medicaid Other | Attending: Emergency Medicine | Admitting: Emergency Medicine

## 2020-06-03 ENCOUNTER — Encounter (HOSPITAL_COMMUNITY): Payer: Self-pay | Admitting: Emergency Medicine

## 2020-06-03 ENCOUNTER — Emergency Department (HOSPITAL_COMMUNITY): Payer: Medicaid Other

## 2020-06-03 ENCOUNTER — Other Ambulatory Visit: Payer: Self-pay

## 2020-06-03 DIAGNOSIS — R202 Paresthesia of skin: Secondary | ICD-10-CM | POA: Insufficient documentation

## 2020-06-03 DIAGNOSIS — Y92410 Unspecified street and highway as the place of occurrence of the external cause: Secondary | ICD-10-CM | POA: Diagnosis not present

## 2020-06-03 DIAGNOSIS — M79601 Pain in right arm: Secondary | ICD-10-CM | POA: Diagnosis not present

## 2020-06-03 DIAGNOSIS — E049 Nontoxic goiter, unspecified: Secondary | ICD-10-CM | POA: Diagnosis not present

## 2020-06-03 DIAGNOSIS — J45909 Unspecified asthma, uncomplicated: Secondary | ICD-10-CM | POA: Diagnosis not present

## 2020-06-03 DIAGNOSIS — Z87891 Personal history of nicotine dependence: Secondary | ICD-10-CM | POA: Insufficient documentation

## 2020-06-03 DIAGNOSIS — M2578 Osteophyte, vertebrae: Secondary | ICD-10-CM | POA: Diagnosis not present

## 2020-06-03 DIAGNOSIS — S199XXA Unspecified injury of neck, initial encounter: Secondary | ICD-10-CM | POA: Diagnosis not present

## 2020-06-03 DIAGNOSIS — R200 Anesthesia of skin: Secondary | ICD-10-CM | POA: Diagnosis not present

## 2020-06-03 DIAGNOSIS — M25511 Pain in right shoulder: Secondary | ICD-10-CM | POA: Diagnosis not present
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
## 2020-06-03 NOTE — Discharge Instructions (Signed)
Your CT scan showed no evidence of acute abnormality in your neck and your x-ray of your shoulder was also normal.  You may be having some muscle spasm causing the symptoms.  I would recommend warm moist heating to the neck and shoulder.  I am giving you a sling for comfort you may take it off as needed.  If your symptoms are not improving please follow-up as soon as possible with an orthopedics specialist.  Return to the emergency department immediately if you develop any of the following symptoms: You have numbness, tingling, or weakness in the arms or legs. You develop severe headaches not relieved with medicine. You have severe neck pain, especially tenderness in the middle of the back of your neck. You have changes in bowel or bladder control. There is increasing pain in any area of the body. You have shortness of breath, light-headedness, dizziness, or fainting. You have chest pain. You feel sick to your stomach (nauseous), throw up (vomit), or sweat. You have increasing abdominal discomfort. There is blood in your urine, stool, or vomit. You have pain in your shoulder (shoulder strap areas). You feel your symptoms are getting worse.
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
## 2020-06-07 ENCOUNTER — Telehealth: Payer: Self-pay

## 2020-06-07 NOTE — Telephone Encounter (Signed)
Transition Care Management Unsuccessful Follow-up Telephone Call  Date of discharge and from where:  06/03/2020 [name]  Attempts:  1st Attempt  Reason for unsuccessful TCM follow-up call:  Left voice message

## 2020-06-10 NOTE — Telephone Encounter (Signed)
Transition Care Management Follow-up Telephone Call  Date of discharge and from where: 06/03/2020 [name] ED  How have you been since you were released from the hospital? Still feeling some pain, managing with heating pad. Patient stated she has not been able to contact the Orthopedic office due to her testing positive for COVID.   Any questions or concerns? No  Items Reviewed:  Did the pt receive and understand the discharge instructions provided? Yes   Medications obtained and verified? No medications ordered.  Other? No   Any new allergies since your discharge? No   Dietary orders reviewed? Yes  Do you have support at home? Yes    Functional Questionnaire: (I = Independent and D = Dependent) ADLs: I  Bathing/Dressing- I  Meal Prep- I  Eating- I  Maintaining continence- I  Transferring/Ambulation- I  Managing Meds- I  Follow up appointments reviewed:   PCP Hospital f/u appt confirmed? Yes  Scheduled to see [facility] on 07/11/2020 @ 1:20pm. Patient stated she is going to call to see if she can set up virtual appt sooner.   [facility] f/u appt confirmed? No  Patient will call after COVID quarantine.   Are transportation arrangements needed? No   If their condition worsens, is the pt aware to call PCP or go to the Emergency Dept.? Yes  Was the patient provided with contact information for the PCP's office or ED? Yes  Was to pt encouraged to call back with questions or concerns? Yes

## 2020-07-07 DIAGNOSIS — L0231 Cutaneous abscess of buttock: Secondary | ICD-10-CM | POA: Diagnosis not present

## 2020-07-11 ENCOUNTER — Ambulatory Visit: Payer: Medicaid Other | Admitting: Internal Medicine

## 2020-07-19 ENCOUNTER — Ambulatory Visit: Payer: Medicaid Other | Admitting: Internal Medicine

## 2020-08-15 ENCOUNTER — Other Ambulatory Visit: Payer: Self-pay

## 2020-08-15 ENCOUNTER — Encounter (HOSPITAL_COMMUNITY): Payer: Self-pay | Admitting: *Deleted

## 2020-08-15 ENCOUNTER — Emergency Department (HOSPITAL_COMMUNITY): Payer: Medicaid Other

## 2020-08-15 ENCOUNTER — Emergency Department (HOSPITAL_COMMUNITY)
Admission: EM | Admit: 2020-08-15 | Discharge: 2020-08-15 | Discharge status: Against Medical Advice | Payer: Medicaid Other | Attending: Emergency Medicine | Admitting: Emergency Medicine

## 2020-08-15 DIAGNOSIS — J111 Influenza due to unidentified influenza virus with other respiratory manifestations: Secondary | ICD-10-CM | POA: Diagnosis not present

## 2020-08-15 DIAGNOSIS — J45909 Unspecified asthma, uncomplicated: Secondary | ICD-10-CM | POA: Insufficient documentation

## 2020-08-15 DIAGNOSIS — R55 Syncope and collapse: Secondary | ICD-10-CM | POA: Diagnosis not present

## 2020-08-15 DIAGNOSIS — Z87891 Personal history of nicotine dependence: Secondary | ICD-10-CM | POA: Diagnosis not present

## 2020-08-15 DIAGNOSIS — Z28310 Unvaccinated for covid-19: Secondary | ICD-10-CM | POA: Diagnosis not present

## 2020-08-15 DIAGNOSIS — Z20822 Contact with and (suspected) exposure to covid-19: Secondary | ICD-10-CM | POA: Diagnosis not present

## 2020-08-15 DIAGNOSIS — R059 Cough, unspecified: Secondary | ICD-10-CM | POA: Diagnosis not present

## 2020-08-15 LAB — CBC WITH DIFFERENTIAL/PLATELET
Abs Immature Granulocytes: 0.01 10*3/uL (ref 0.00–0.07)
Basophils Absolute: 0 10*3/uL (ref 0.0–0.1)
Basophils Relative: 1 %
Eosinophils Absolute: 0.2 10*3/uL (ref 0.0–0.5)
Eosinophils Relative: 4 %
HCT: 38.6 % (ref 36.0–46.0)
Hemoglobin: 12.6 g/dL (ref 12.0–15.0)
Immature Granulocytes: 0 %
Lymphocytes Relative: 53 %
Lymphs Abs: 2.4 10*3/uL (ref 0.7–4.0)
MCH: 32 pg (ref 26.0–34.0)
MCHC: 32.6 g/dL (ref 30.0–36.0)
MCV: 98 fL (ref 80.0–100.0)
Monocytes Absolute: 0.7 10*3/uL (ref 0.1–1.0)
Monocytes Relative: 15 %
Neutro Abs: 1.2 10*3/uL — ABNORMAL LOW (ref 1.7–7.7)
Neutrophils Relative %: 27 %
Platelets: 244 10*3/uL (ref 150–400)
RBC: 3.94 MIL/uL (ref 3.87–5.11)
RDW: 12.1 % (ref 11.5–15.5)
WBC: 4.5 10*3/uL (ref 4.0–10.5)
nRBC: 0 % (ref 0.0–0.2)

## 2020-08-15 LAB — COMPREHENSIVE METABOLIC PANEL
ALT: 13 U/L (ref 0–44)
AST: 18 U/L (ref 15–41)
Albumin: 4 g/dL (ref 3.5–5.0)
Alkaline Phosphatase: 49 U/L (ref 38–126)
Anion gap: 9 (ref 5–15)
BUN: 10 mg/dL (ref 6–20)
CO2: 22 mmol/L (ref 22–32)
Calcium: 8.6 mg/dL — ABNORMAL LOW (ref 8.9–10.3)
Chloride: 104 mmol/L (ref 98–111)
Creatinine, Ser: 0.69 mg/dL (ref 0.44–1.00)
GFR, Estimated: >60 mL/min (ref >60)
Glucose, Bld: 88 mg/dL (ref 70–99)
Potassium: 3.4 mmol/L — ABNORMAL LOW (ref 3.5–5.1)
Sodium: 135 mmol/L (ref 135–145)
Total Bilirubin: 0.4 mg/dL (ref 0.3–1.2)
Total Protein: 7 g/dL (ref 6.5–8.1)

## 2020-08-15 LAB — HCG, QUANTITATIVE, PREGNANCY: hCG, Beta Chain, Quant, S: <1 m[IU]/mL (ref <5)

## 2020-08-15 LAB — RESP PANEL BY RT-PCR (FLU A&B, COVID) ARPGX2
Influenza A by PCR: NEGATIVE
Influenza B by PCR: NEGATIVE
SARS Coronavirus 2 by RT PCR: NEGATIVE

## 2020-08-15 MED ORDER — BENZONATATE 100 MG PO CAPS: 100.0000 mg | ORAL_CAPSULE | Freq: Three times a day (TID) | ORAL | 0 refills | Status: DC | PRN

## 2020-08-15 MED ORDER — SODIUM CHLORIDE 0.9 % IV BOLUS
1000.0000 mL | Freq: Once | INTRAVENOUS | Status: AC
Start: 2020-08-15 — End: 2020-08-15
  Administered 2020-08-15: 1000 mL via INTRAVENOUS

## 2020-08-15 MED ORDER — ONDANSETRON HCL 4 MG/2ML IJ SOLN
4.0000 mg | Freq: Once | INTRAMUSCULAR | Status: AC
  Administered 2020-08-15: 4 mg via INTRAVENOUS
  Filled 2020-08-15: qty 2

## 2020-08-15 MED ORDER — ONDANSETRON 4 MG PO TBDP: 4.0000 mg | ORAL_TABLET | Freq: Three times a day (TID) | ORAL | 0 refills | Status: DC | PRN

## 2020-08-15 NOTE — ED Provider Notes (Signed)
[facility] Provider Note   CSN: [id number] Arrival date & time: 08/15/20  1616     History No chief complaint on file.   Valerie Huber is a 35 y.o. female with a history of asthma.  Patient presents with chief complaint of syncopal episode and flulike illness.  Patient reports that she had a syncopal episode earlier this afternoon.  Patient reports that she was standing and blowing her nose when she started to feel dizzy and lightheaded.  Patient reports that she fell to the floor and was unsure how long she was there for.  Patient was able to get herself off the floor and ambulate without difficulty.  Patient denies any head, neck, or back pain.  Patient denies any numbness or tingling to extremities, weakness to extremities, bowel or bladder dysfunction, saddle anesthesia.  Patient reports that she has had cough, rhinorrhea, congestion, myalgia for the last week and a half.  Patient reports that her symptoms have gotten progressively worse over this time.  Patient reports that she was running fevers last week however is no longer afebrile.  Patient reports that she has been having chills.  She reports that over the last 2 days she has developed nausea, vomiting, and diarrhea.  Patient reports that she has vomited 12 times in the last 24 hours.  Patient denies any bloody emesis or coffee-ground emesis.  Patient reports that her cough is productive.  Over the last few days her cough has been producing pink sputum.    Patient denies any unilateral leg swelling or tenderness, history of DVT or PE, cancer treatment in the last 6 months, hormone therapy, recent immobilization.   She denies being vaccinated for COVID-19.  LMP 08/12/20.    HPI     Past Medical History:  Diagnosis Date  . Asthma   . 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 Endometriosis   . Irregular periods 12/31/2014  . Pelvic pain in female 12/31/2014  . Pregnant 04/14/2015  . Second degree uterine  prolapse 12/31/2014  . Seizures ([place])   . Vaginal Pap smear, abnormal     Patient Active Problem List   Diagnosis Date Noted  . Encounter to establish care 04/12/2020  . Encounter for examination following treatment at hospital 04/12/2020  . Heme positive stool 06/30/2019  . Chronic diarrhea 06/30/2019  . Rectal bleeding   . Complex regional pain syndrome type 1 of right lower extremity 05/20/2017  . Depression 11/30/2015  . H/O: substance abuse ([place]) 11/30/2015  . History of seizures 11/30/2015  . Marijuana use 07/05/2015  . Moderate dysplasia of cervix (CIN II)  at endocervical margin of LEEP specimen 01/22/2015  . Pelvic pain in female 12/31/2014  . Endometriosis determined by laparoscopy 12/31/2014  . Second degree uterine prolapse 12/31/2014  . Irregular periods 12/31/2014  . 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 Hematemesis with nausea   . Anaphylaxis 09/04/2011    Past Surgical History:  Procedure Laterality Date  . BIOPSY  07/06/2019   Procedure: BIOPSY;  Surgeon: Daneil Dolin, MD;  Location[facility];  Service: Endoscopy;;  colon   . COLONOSCOPY WITH PROPOFOL N/A 07/06/2019   one 3 mm polyp hyperplastic, segmental biopsies negative  . DILITATION & CURRETTAGE/HYSTROSCOPY WITH NOVASURE ABLATION N/A 10/08/2017   Procedure: DILATATION & CURETTAGE/HYSTEROSCOPY WITH MINERVA  ENDOMETRIAL ABLATION (Procedure #2);  Surgeon: [name], MD;  Location: [facility] ORS;  Service: Gynecology;  Laterality: N/A;  . ESOPHAGOGASTRODUODENOSCOPY N/A 12/10/2014   SLF:  hematoemesis due to moderate erosive gastritis, duoedentitis, and duodenal ulcer  . LEEP    . POLYPECTOMY  07/06/2019   Procedure: POLYPECTOMY;  Surgeon: Daneil Dolin, MD;  [facility]: [facility];  Service: Endoscopy;;  . surgery for endometriosis  2005  . TUBAL LIGATION Bilateral 10/08/2017   Procedure: BILATERAL TUBAL STERILIZATION WITH FALLOPE RINGS APPLICATION (Procedure #1);  Surgeon: [name], MD;  Location: [facility] OR[facility];  Service: Gynecology;  Laterality: Bilateral;     OB History    Gravida  3   Para  2   Term  2   Preterm      AB  1   Living  2     SAB      IAB  1   Ectopic      Multiple  0   Live Births  2           Family History  Problem Relation Age of Onset  . Cancer Mother        kidney   . Asthma Mother   . Hypertension Mother   . Colon polyps Sister        67, large polyp, done at [facility]   . Cancer Paternal Grandmother   . Asthma Son   . Colon cancer Neg Hx     Social History   Tobacco Use  . Smoking status: Former Smoker    Packs/day: 0.25    Years: 14.00    Pack years: 3.50    Types: Cigarettes  . Smokeless tobacco: Never Used  . Tobacco comment: quit in 2017   Vaping Use  . Vaping Use: Never used  Substance Use Topics  . Alcohol use: Yes    Alcohol/week: 0.0 standard drinks    Comment: occ  . Drug use: No    Home Medications Prior to Admission medications   Medication Sig Start Date End Date Taking? Authorizing Provider  acetaminophen (TYLENOL) 500 MG tablet Take 500-1,500 mg by mouth every 6 (six) hours as needed (for pain.).     [provider]  DULoxetine (CYMBALTA) 30 MG capsule TAKE 1 CAPSULE(30 MG) BY MOUTH DAILY 05/16/20   Lindell Spar, MD  loperamide (IMODIUM A-D) 2 MG tablet Take 2-4 mg by mouth 4 (four) times daily as needed for diarrhea or loose stools.     [provider]  silver sulfADIAZINE (SILVADENE) 1 % cream Apply to area TID 12/14/19   Florian Buff, MD    Allergies    Ibuprofen and Flexeril [cyclobenzaprine]  Review of Systems   Review of Systems  Constitutional: Negative for chills and fever.  HENT: Positive for congestion, rhinorrhea, sneezing and sore throat. Negative for drooling and trouble swallowing.   Eyes: Negative for visual disturbance.  Respiratory: Positive for cough. Negative for shortness of breath.   Cardiovascular: Positive for chest pain (pleuritic ).   Gastrointestinal: Positive for diarrhea, nausea and vomiting. Negative for abdominal distention, abdominal pain, anal bleeding, blood in stool, constipation and rectal pain.  Genitourinary: Negative for difficulty urinating, dysuria and frequency.  Musculoskeletal: Positive for myalgias. Negative for back pain and neck pain.  Skin: Negative for color change and rash.  Neurological: Positive for syncope. Negative for dizziness, tremors, seizures, facial asymmetry, speech difficulty, weakness, light-headedness, numbness and headaches.  Psychiatric/Behavioral: Negative for confusion.    Physical Exam Updated Vital Signs BP 124/89   Pulse 71   Temp 98.8 F (37.1 C) (Oral)   Resp (!) 24  SpO2 99%   Physical Exam Vitals and nursing note reviewed.  Constitutional:      General: She is not in acute distress.    Appearance: She is ill-appearing. She is not toxic-appearing or diaphoretic.  HENT:     Head: Normocephalic and atraumatic. No raccoon eyes, abrasion, contusion, masses, right periorbital erythema, left periorbital erythema or laceration.     Jaw: No trismus or pain on movement.     Mouth/Throat:     Pharynx: Oropharynx is clear. Uvula midline. Posterior oropharyngeal erythema present. No pharyngeal swelling, oropharyngeal exudate or uvula swelling.     Tonsils: No tonsillar exudate or tonsillar abscesses. 1+ on the right. 1+ on the left.  Eyes:     General: No scleral icterus.       Right eye: No discharge.        Left eye: No discharge.     Extraocular Movements: Extraocular movements intact.     Pupils: Pupils are equal, round, and reactive to light.  Cardiovascular:     Rate and Rhythm: Normal rate.     Heart sounds: Normal heart sounds.  Pulmonary:     Effort: Pulmonary effort is normal. No tachypnea, bradypnea or respiratory distress.     Breath sounds: Normal breath sounds. No stridor.  Abdominal:     General: There is no distension. There are no signs of injury.      Palpations: Abdomen is soft. There is no mass or pulsatile mass.     Tenderness: There is no abdominal tenderness. There is no guarding or rebound.     Hernia: There is no hernia in the umbilical area or ventral area.  Musculoskeletal:     Cervical back: Neck supple. No swelling, edema, deformity, erythema, signs of trauma, lacerations, rigidity, spasms, torticollis, tenderness, bony tenderness or crepitus. No pain with movement. Normal range of motion.     Thoracic back: No swelling, edema, deformity, signs of trauma, lacerations, spasms, tenderness or bony tenderness.     Lumbar back: No swelling, edema, deformity, signs of trauma, lacerations, spasms, tenderness or bony tenderness. Normal range of motion.     Right lower leg: Normal.     Left lower leg: Normal.  Skin:    General: Skin is warm and dry.     Coloration: Skin is not cyanotic, jaundiced or pale.  Neurological:     General: No focal deficit present.     Mental Status: She is alert.  Psychiatric:        Behavior: Behavior is cooperative.     ED Results / Procedures / Treatments   Labs (all labs ordered are listed, but only abnormal results are displayed) Labs Reviewed  RESP PANEL BY RT-PCR (FLU A&B, COVID) [id number]  CBC WITH DIFFERENTIAL/PLATELET  COMPREHENSIVE METABOLIC PANEL  HCG, QUANTITATIVE, PREGNANCY    EKG EKG Interpretation  Date/Time:  Monday August 15 2020 17:36:48 EDT Ventricular Rate:  71 PR Interval:  153 QRS Duration: 90 QT Interval:  409 QTC Calculation: 445 R Axis:   -29 Text Interpretation: Sinus rhythm Probable left atrial enlargement Borderline left axis deviation Confirmed by Isla Pence [phone]) on 08/15/2020 5:40:05 PM   Radiology DG Chest Portable 1 View  Result Date: 08/15/2020 CLINICAL DATA:  Cough, congestion, syncope EXAM: PORTABLE CHEST 1 VIEW COMPARISON:  07/16/2018 FINDINGS: Heart and mediastinal contours are within normal limits. No focal opacities or effusions. No acute bony  abnormality. IMPRESSION: Normal study. Electronically Signed   By: Rolm Baptise M.D.   On:  08/15/2020 17:17    Procedures Procedures   Medications Ordered in ED Medications  sodium chloride 0.9 % bolus 1,000 mL (has no administration in time range)  ondansetron (ZOFRAN) injection 4 mg (4 mg Intravenous Given 08/15/20 1730)    ED Course  I have reviewed the triage vital signs and the nursing notes.  Pertinent labs & imaging results that were available during my care of the patient were reviewed by me and considered in my medical decision making (see chart for details).    MDM Rules/Calculators/A&P                          Alert ill-appearing 35 year old female.  Patient presents with chief complaint of flu/cold-like symptoms x 1.5 week and syncopal episode today.  Patient is not vaccinated for COVID-19.  Head is atraumatic.  No midline tenderness, step-off, or deformity noted to cervical, thoracic, or lumbar spine.  She denies any numbness or tingling to extremities, weakness to extremities, bowel or bladder dysfunction, saddle anesthesia, head pain, neck pain, or back pain.  Low suspicion for intracranial abnormality, or cervical spine injury.    I personally ambulated the patient for 1 minute and observed her oxygen saturation.  Oxygen saturation remained 99% or greater on room air.  Will obtain chest x-ray, CBC, BMP.  Patient given Zofran and 1 L fluid bolus. Chest x-ray shows no acute cardiopulmonary disease.  Due to patient's pleuritic chest pain, hemoptysis, syncopal episode and possible COVID-19 diagnosis concern for possible pulmonary embolism.  Will obtain CTA chest and pregnancy test.  CTA is unremarkable would consider sending patient home with referral to ambulatory COVID-19 clinic, prescription for Zofran, prescription for Tessalon.    Patient care transferred to PA Santiago Glad at the end of my shift. Patient presentation, ED course, and plan of care discussed with review of all  pertinent labs and imaging. Please see his/her note for further details regarding further ED course and disposition.   Final Clinical Impression(s) / ED Diagnoses Final diagnoses:  None    Rx / DC Orders ED Discharge Orders    None       Dyann Ruddle 08/15/20 1845    Isla Pence, MD 08/29/20 [phone]
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
## 2020-08-15 NOTE — ED Triage Notes (Signed)
Cough , congestion with syncopal episode today

## 2020-08-15 NOTE — ED Notes (Signed)
Pt states she needed to leave to check on her 35 yo daughter at home.  Unable to reach daughter and pt concerned about her.  EDP made aware and has spoke with pt.  Pt verbalized understanding of leaving ama and has signed AMA from.

## 2020-08-16 ENCOUNTER — Telehealth: Payer: Self-pay

## 2020-08-16 NOTE — Telephone Encounter (Signed)
Transition Care Management Follow-up Telephone Call  Date of discharge and from where: 08/15/2020 from [facility]  How have you been since you were released from the hospital? Pt stated that she is feeling some better today.   Any questions or concerns? No  Items Reviewed:  Did the pt receive and understand the discharge instructions provided? Yes   Medications obtained and verified? Yes   Other? No   Any new allergies since your discharge? No   Dietary orders reviewed? n/a  Do you have support at home? Yes   Functional Questionnaire: (I = Independent and D = Dependent) ADLs: I  Bathing/Dressing- I  Meal Prep- I  Eating- I  Maintaining continence- I  Transferring/Ambulation- I  Managing Meds- I  Follow up appointments reviewed:   PCP Hospital f/u appt confirmed? No  Pt stated that she will call PCP to schedule.   [facility] f/u appt confirmed? No    Are transportation arrangements needed? No    If their condition worsens, is the pt aware to call PCP or go to the Emergency Dept.? Yes  Was the patient provided with contact information for the PCP's office or ED? Yes  Was to pt encouraged to call back with questions or concerns? Yes

## 2020-09-01 ENCOUNTER — Other Ambulatory Visit: Payer: Self-pay

## 2020-09-01 ENCOUNTER — Encounter: Payer: Self-pay | Admitting: Internal Medicine

## 2020-09-01 ENCOUNTER — Ambulatory Visit: Payer: Medicaid Other | Admitting: Internal Medicine

## 2020-09-01 VITALS — BP 124/88 | HR 92 | Temp 98.8°F | Resp 18 | Ht 64.0 in | Wt 165.1 lb

## 2020-09-01 DIAGNOSIS — K920 Hematemesis: Secondary | ICD-10-CM | POA: Diagnosis not present

## 2020-09-01 DIAGNOSIS — J301 Allergic rhinitis due to pollen: Secondary | ICD-10-CM | POA: Insufficient documentation

## 2020-09-01 DIAGNOSIS — Z09 Encounter for follow-up examination after completed treatment for conditions other than malignant neoplasm: Secondary | ICD-10-CM | POA: Diagnosis not present

## 2020-09-01 MED ORDER — ONDANSETRON 4 MG PO TBDP: 4.0000 mg | ORAL_TABLET | Freq: Three times a day (TID) | ORAL | 0 refills | Status: DC | PRN

## 2020-09-01 MED ORDER — PANTOPRAZOLE SODIUM 40 MG PO TBEC: 40.0000 mg | DELAYED_RELEASE_TABLET | Freq: Every day | ORAL | 3 refills | Status: DC

## 2020-09-01 MED ORDER — AZELASTINE HCL 0.1 % NA SOLN
1.0000 | Freq: Two times a day (BID) | NASAL | 12 refills | Status: DC
Start: 2020-09-01 — End: 2020-11-07

## 2020-09-01 MED ORDER — LEVOCETIRIZINE DIHYDROCHLORIDE 5 MG PO TABS: 5.0000 mg | ORAL_TABLET | Freq: Every evening | ORAL | 2 refills | Status: DC

## 2020-09-01 NOTE — Progress Notes (Signed)
Acute Office Visit  Subjective:    Patient ID: Valerie Huber, female    DOB: 1986/04/30, 35 y.o.   MRN: [id number]  Chief Complaint  Patient presents with  . Emesis    Pt went to ER for passing out a few weeks ago last Friday started puking blood her chest has been feeling heavy for the last three days     HPI Patient is in today for evaluation of vomiting for last 1 week. She episode of URTI and nausea/vomiting started along with it. She went to ER after an episode of syncope due to likely dehydration. She was given IV fluids. She had CT chest, which was negative for PE. She completed antibiotics for URTI. She thinks that her cough and nasal congestion is related to allergies, which is better with Zyrtec. Currently, she denies any fever, chills, sore throat. She still has cough, which is associated with chest tightness/pain. She also has blood streaked vomiting 4 days ago. She denies melena or hematochezia.  Past Medical History:  Diagnosis Date  . Asthma   . 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 Endometriosis   . Irregular periods 12/31/2014  . Pelvic pain in female 12/31/2014  . Pregnant 04/14/2015  . Second degree uterine prolapse 12/31/2014  . Seizures ([place])   . Vaginal Pap smear, abnormal     Past Surgical History:  Procedure Laterality Date  . BIOPSY  07/06/2019   Procedure: BIOPSY;  Surgeon: Daneil Dolin, MD;  Location: [facility];  Service: Endoscopy;;  colon   . COLONOSCOPY WITH PROPOFOL N/A 07/06/2019   one 3 mm polyp hyperplastic, segmental biopsies negative  . DILITATION & CURRETTAGE/HYSTROSCOPY WITH NOVASURE ABLATION N/A 10/08/2017   Procedure: DILATATION & CURETTAGE/HYSTEROSCOPY WITH MINERVA  ENDOMETRIAL ABLATION (Procedure #2);  Surgeon: [name], MD;  Location: [facility] ORS;  Service: Gynecology;  Laterality: N/A;  . ESOPHAGOGASTRODUODENOSCOPY N/A 12/10/2014   SLF: hematoemesis due to moderate erosive gastritis, duoedentitis, and duodenal ulcer  . LEEP    .  POLYPECTOMY  07/06/2019   Procedure: POLYPECTOMY;  Surgeon: Daneil Dolin, MD;  Location: [facility];  Service: Endoscopy;;  . surgery for endometriosis  2005  . TUBAL LIGATION Bilateral 10/08/2017   Procedure: BILATERAL TUBAL STERILIZATION WITH FALLOPE RINGS APPLICATION (Procedure #1);  Surgeon: [name], MD;  Location: [facility] ORS;  Service: Gynecology;  Laterality: Bilateral;    Family History  Problem Relation Age of Onset  . Cancer Mother        kidney   . Asthma Mother   . Hypertension Mother   . Colon polyps Sister        37, large polyp, done at [facility]   . Cancer Paternal Grandmother   . Asthma Son   . Colon cancer Neg Hx     Social History   Socioeconomic History  . Marital status: Single    Spouse name: Not on file  . Number of children: Not on file  . Years of education: Not on file  . Highest education level: Not on file  Occupational History  . Occupation: Housecleaning  Tobacco Use  . Smoking status: Former Smoker    Packs/day: 0.25    Years: 14.00    Pack years: 3.50    Types: Cigarettes  . Smokeless tobacco: Never Used  . Tobacco comment: quit in 2017   Vaping Use  . Vaping Use: Never used  Substance and Sexual Activity  . Alcohol use: Yes    Alcohol/week: 0.0  standard drinks    Comment: occ  . Drug use: No  . Sexual activity: Yes    Birth control/protection: Surgical    Comment: tubal and ablation  Other Topics Concern  . Not on file  Social History Narrative  . Not on file   Social Determinants of Health   Financial Resource Strain: Not on file  Food Insecurity: Not on file  Transportation Needs: Not on file  Physical Activity: Not on file  Stress: Not on file  Social Connections: Not on file  Intimate Partner Violence: Not on file    Outpatient Medications Prior to Visit  Medication Sig Dispense Refill  . acetaminophen (TYLENOL) 500 MG tablet Take 500-1,500 mg by mouth every 6 (six) hours as needed (for pain.).     [name] DULoxetine  (CYMBALTA) 30 MG capsule TAKE 1 CAPSULE(30 MG) BY MOUTH DAILY 30 capsule 0  . loperamide (IMODIUM A-D) 2 MG tablet Take 2-4 mg by mouth 4 (four) times daily as needed for diarrhea or loose stools.     . silver sulfADIAZINE (SILVADENE) 1 % cream Apply to area TID 50 g 11  . cetirizine (ZYRTEC) 10 MG tablet Take 10 mg by mouth daily.    . benzonatate (TESSALON) 100 MG capsule Take 1 capsule (100 mg total) by mouth every 8 (eight) hours as needed for cough. (Patient not taking: Reported on 09/01/2020) 21 capsule 0  . ondansetron (ZOFRAN ODT) 4 MG disintegrating tablet Take 1 tablet (4 mg total) by mouth every 8 (eight) hours as needed for nausea or vomiting. (Patient not taking: Reported on 09/01/2020) 20 tablet 0   No facility-administered medications prior to visit.    Allergies  Allergen Reactions  . Ibuprofen Anaphylaxis    Throat, eyes, lips swollen  . Flexeril [Cyclobenzaprine] Hives    Review of Systems  Constitutional: Negative for chills and fever.  HENT: Positive for congestion and postnasal drip. Negative for sinus pressure, sinus pain and sore throat.   Eyes: Negative for pain and discharge.  Respiratory: Positive for cough. Negative for shortness of breath.   Cardiovascular: Negative for chest pain and palpitations.  Gastrointestinal: Positive for nausea and vomiting. Negative for abdominal pain, constipation and diarrhea.  Endocrine: Negative for polydipsia and polyuria.  Genitourinary: Negative for dysuria and hematuria.  Musculoskeletal: Negative for neck pain and neck stiffness.  Skin: Negative for rash.  Neurological: Positive for syncope. Negative for dizziness and weakness.  Psychiatric/Behavioral: Negative for agitation and behavioral problems.       Objective:    Physical Exam Vitals reviewed.  Constitutional:      General: She is not in acute distress.    Appearance: She is not diaphoretic.  HENT:     Head: Normocephalic and atraumatic.     Nose: Nose  normal.     Mouth/Throat:     Mouth: Mucous membranes are moist.  Eyes:     General: No scleral icterus.    Extraocular Movements: Extraocular movements intact.     Pupils: Pupils are equal, round, and reactive to light.  Cardiovascular:     Rate and Rhythm: Normal rate and regular rhythm.     Pulses: Normal pulses.     Heart sounds: Normal heart sounds. No murmur heard.   Pulmonary:     Breath sounds: Normal breath sounds. No wheezing or rales.  Abdominal:     Palpations: Abdomen is soft.     Tenderness: There is no abdominal tenderness.  Musculoskeletal:     Cervical  back: Neck supple. No tenderness.     Right lower leg: No edema.     Left lower leg: No edema.  Skin:    General: Skin is warm.     Findings: No rash.  Neurological:     General: No focal deficit present.     Mental Status: She is alert and oriented to person, place, and time.     Sensory: No sensory deficit.     Motor: No weakness.  Psychiatric:        Mood and Affect: Mood normal.        Behavior: Behavior normal.     BP 124/88 (BP Location: Right Arm, Patient Position: Sitting, Cuff Size: Normal)   Pulse 92   Temp 98.8 F (37.1 C) (Oral)   Resp 18   Ht 5\' 4"  (1.626 m)   Wt 165 lb 1.9 oz (74.9 kg)   SpO2 98%   BMI 28.34 kg/m  Wt Readings from Last 3 Encounters:  09/01/20 165 lb 1.9 oz (74.9 kg)  06/03/20 160 lb (72.6 kg)  04/12/20 166 lb 6.4 oz (75.5 kg)    Health Maintenance Due  Topic Date Due  . Hepatitis C Screening  Never done  . COVID-19 Vaccine (1) Never done  . TETANUS/TDAP  Never done  . PAP SMEAR-Modifier  10/28/2020    There are no preventive care reminders to display for this patient.   Lab Results  Component Value Date   TSH 0.684 12/31/2014   Lab Results  Component Value Date   WBC 4.5 08/15/2020   HGB 12.6 08/15/2020   HCT 38.6 08/15/2020   MCV 98.0 08/15/2020   PLT 244 08/15/2020   Lab Results  Component Value Date   NA 135 08/15/2020   K 3.4 (L) 08/15/2020    CO2 22 08/15/2020   GLUCOSE 88 08/15/2020   BUN 10 08/15/2020   CREATININE 0.69 08/15/2020   BILITOT 0.4 08/15/2020   ALKPHOS 49 08/15/2020   AST 18 08/15/2020   ALT 13 08/15/2020   PROT 7.0 08/15/2020   ALBUMIN 4.0 08/15/2020   CALCIUM 8.6 (L) 08/15/2020   ANIONGAP 9 08/15/2020   No results found for: CHOL No results found for: HDL No results found for: LDLCALC No results found for: TRIG No results found for: CHOLHDL No results found for: HGBA1C     Assessment & Plan:   Problem List Items Addressed This Visit      Respiratory   Seasonal allergic rhinitis due to pollen Started Xyzal 5 mg QD Azelastine nasal spray Nasal saline spray PRN   Relevant Medications   levocetirizine (XYZAL) 5 MG tablet   azelastine (ASTELIN) 0.1 % nasal spray     Digestive   Hematemesis with nausea - Primary Likely related to viral URTI leading to nausea/vomiting Frequent vomiting/retching might have contributed to hematemesis Zofran PRN for nausea Started Pantoprazole Advised to contact if she has melena or hematochezia   Relevant Medications   ondansetron (ZOFRAN ODT) 4 MG disintegrating tablet   pantoprazole (PROTONIX) 40 MG tablet     Other   Encounter for examination following treatment at hospital ER chart reviewed including imaging EKG showing sinus rhythm. No signs of active ischemia. Syncopal episode likely related to dehydration in the setting of nausea/vomiting Advised to avoid skipping any meal and improve hydration by increasing fluid intake       Meds ordered this encounter  Medications  . ondansetron (ZOFRAN ODT) 4 MG disintegrating tablet    Sig:  Take 1 tablet (4 mg total) by mouth every 8 (eight) hours as needed for nausea or vomiting.    Dispense:  20 tablet    Refill:  0  . levocetirizine (XYZAL) 5 MG tablet    Sig: Take 1 tablet (5 mg total) by mouth every evening.    Dispense:  30 tablet    Refill:  2  . pantoprazole (PROTONIX) 40 MG tablet    Sig:  Take 1 tablet (40 mg total) by mouth daily.    Dispense:  30 tablet    Refill:  3  . azelastine (ASTELIN) 0.1 % nasal spray    Sig: Place 1 spray into both nostrils 2 (two) times daily. Use in each nostril as directed    Dispense:  30 mL    Refill:  12     Renne Platts Keith Rake, MD

## 2020-09-01 NOTE — Patient Instructions (Addendum)
Please take Zofran as needed for nausea/vomiting. Avoid daily dosing.  Please stay hydrated by taking at least 2 liters of fluid.  Please start taking Pantoprazole for gastritis.  Please start taking Xyzal for allergies. Use Azelastine spray for nasal congestion and allergies.

## 2020-09-06 ENCOUNTER — Encounter: Payer: Self-pay | Admitting: Emergency Medicine

## 2020-09-06 ENCOUNTER — Ambulatory Visit
Admission: EM | Admit: 2020-09-06 | Discharge: 2020-09-06 | Disposition: A | Discharge status: Home / Self Care | Payer: Medicaid Other | Attending: Emergency Medicine | Admitting: Emergency Medicine

## 2020-09-06 ENCOUNTER — Other Ambulatory Visit: Payer: Self-pay

## 2020-09-06 DIAGNOSIS — R1030 Lower abdominal pain, unspecified: Secondary | ICD-10-CM

## 2020-09-06 MED ORDER — ALUM & MAG HYDROXIDE-SIMETH 200-200-20 MG/5ML PO SUSP
30.0000 mL | Freq: Once | ORAL | Status: AC
  Administered 2020-09-06: 30 mL via ORAL

## 2020-09-06 MED ORDER — DICYCLOMINE HCL 20 MG PO TABS: 20.0000 mg | ORAL_TABLET | Freq: Two times a day (BID) | ORAL | 0 refills | Status: DC

## 2020-09-06 NOTE — ED Triage Notes (Signed)
Lower abd pain.  States the pain feels like endometriosis pain that she has had in the past.  currently on menstrual cycle.  Pain started 3 to 4 days ago.  States she did see her PCP and was advised to see an OB/GYN.  States she has an appointment on May 5th.

## 2020-09-06 NOTE — Discharge Instructions (Signed)
Unable to rule out appendicitis, gallbladder disease, ectopic pregnancy, ovarian torsion, ovarian cyst, in urgent care setting.  Offered patient further evaluation and management in the ED.  Patient declines at this time and would like to try outpatient therapy first.  Aware of the risk associated with this decision including missed diagnosis, organ damage, organ failure, and/or death.  Patient aware and in agreement.     GI cocktail Rest and push fluids Bentyl prescribed.  Take as directed Follow up with OB/GYN for further evaluation If you experience new or worsening symptoms return or go to ER such as fever, chills, nausea, vomiting, diarrhea, bloody or dark tarry stools, constipation, urinary symptoms, worsening abdominal discomfort, symptoms that do not improve with medications, inability to keep fluids down, etc..[name]

## 2020-09-06 NOTE — ED Provider Notes (Signed)
[place]   [id number] 09/06/20 Arrival Time: 7829  CC: ABDOMINAL DISCOMFORT  SUBJECTIVE:  Valerie Huber is a 35 y.o. female who presents with complaint of abdominal discomfort that began 4-5 days.  Denies trauma or precipitating event.  Currently on menstrual cycle.  Patient has history of endometriosis.  Localizes pain to lower abdomen.  Describes as worsening, constant and "contractions" in character.  "10"/10.  Has tried OTC medications without relief.  Denies aggravating factors.  Reports similar symptoms in the past.    Denies fever, chills, nausea, vomiting, chest pain, SOB, diarrhea, constipation, hematochezia, melena, dysuria, difficulty urinating, increased frequency or urgency, flank pain, loss of bowel or bladder function, vaginal discharge, vaginal odor, vaginal bleeding, dyspareunia, pelvic pain.     Patient's last menstrual period was 09/06/2020.  ROS: As per HPI.  All other pertinent ROS negative.     Past Medical History:  Diagnosis Date  . Asthma   . 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 Endometriosis   . Irregular periods 12/31/2014  . Pelvic pain in female 12/31/2014  . Pregnant 04/14/2015  . Second degree uterine prolapse 12/31/2014  . Seizures ([place])   . Vaginal Pap smear, abnormal    Past Surgical History:  Procedure Laterality Date  . BIOPSY  07/06/2019   Procedure: BIOPSY;  Surgeon: Daneil Dolin, MD;  [facility];  Service: Endoscopy;;  colon   . COLONOSCOPY WITH PROPOFOL N/A 07/06/2019   one 3 mm polyp hyperplastic, segmental biopsies negative  . DILITATION & CURRETTAGE/HYSTROSCOPY WITH NOVASURE ABLATION N/A 10/08/2017   Procedure: DILATATION & CURETTAGE/HYSTEROSCOPY WITH MINERVA  ENDOMETRIAL ABLATION (Procedure #2);  Surgeon: [name], MD;  Location: [facility];  Service: Gynecology;  Laterality: N/A;  . ESOPHAGOGASTRODUODENOSCOPY N/A 12/10/2014   SLF: hematoemesis due to moderate erosive gastritis, duoedentitis, and duodenal  ulcer  . LEEP    . POLYPECTOMY  07/06/2019   Procedure: POLYPECTOMY;  Surgeon: Daneil Dolin, MD;  [facility];  Service: Endoscopy;;  . surgery for endometriosis  2005  . TUBAL LIGATION Bilateral 10/08/2017   Procedure: BILATERAL TUBAL STERILIZATION WITH FALLOPE RINGS APPLICATION (Procedure #1);  Surgeon: [name], MD;  Location: [facility];  Service: Gynecology;  Laterality: Bilateral;   Allergies  Allergen Reactions  . Ibuprofen Anaphylaxis    Throat, eyes, lips swollen  . Flexeril [Cyclobenzaprine] Hives   No current facility-administered medications on file prior to encounter.   Current Outpatient Medications on File Prior to Encounter  Medication Sig Dispense Refill  . acetaminophen (TYLENOL) 500 MG tablet Take 500-1,500 mg by mouth every 6 (six) hours as needed (for pain.).     [name] azelastine (ASTELIN) 0.1 % nasal spray Place 1 spray into both nostrils 2 (two) times daily. Use in each nostril as directed 30 mL 12  . DULoxetine (CYMBALTA) 30 MG capsule TAKE 1 CAPSULE(30 MG) BY MOUTH DAILY 30 capsule 0  . levocetirizine (XYZAL) 5 MG tablet Take 1 tablet (5 mg total) by mouth every evening. 30 tablet 2  . loperamide (IMODIUM A-D) 2 MG tablet Take 2-4 mg by mouth 4 (four) times daily as needed for diarrhea or loose stools.     . ondansetron (ZOFRAN ODT) 4 MG disintegrating tablet Take 1 tablet (4 mg total) by mouth every 8 (eight) hours as needed for nausea or vomiting. 20 tablet 0  . pantoprazole (PROTONIX) 40 MG tablet Take 1 tablet (40 mg total) by mouth daily. 30 tablet 3  . silver sulfADIAZINE (SILVADENE)  1 % cream Apply to area TID 50 g 11   Social History   Socioeconomic History  . Marital status: Single    Spouse name: Not on file  . Number of children: Not on file  . Years of education: Not on file  . Highest education level: Not on file  Occupational History  . Occupation: Housecleaning  Tobacco Use  . Smoking status: Former Smoker    Packs/day: 0.25     Years: 14.00    Pack years: 3.50    Types: Cigarettes  . Smokeless tobacco: Never Used  . Tobacco comment: quit in 2017   Vaping Use  . Vaping Use: Never used  Substance and Sexual Activity  . Alcohol use: Yes    Alcohol/week: 0.0 standard drinks    Comment: occ  . Drug use: No  . Sexual activity: Yes    Birth control/protection: Surgical    Comment: tubal and ablation  Other Topics Concern  . Not on file  Social History Narrative  . Not on file   Social Determinants of Health   Financial Resource Strain: Not on file  Food Insecurity: Not on file  Transportation Needs: Not on file  Physical Activity: Not on file  Stress: Not on file  Social Connections: Not on file  Intimate Partner Violence: Not on file   Family History  Problem Relation Age of Onset  . Cancer Mother        kidney   . Asthma Mother   . Hypertension Mother   . Colon polyps Sister        16, large polyp, done at [facility]   . Cancer Paternal Grandmother   . Asthma Son   . Colon cancer Neg Hx      OBJECTIVE:  Vitals:   09/06/20 0856  BP: 123/85  Pulse: 86  Resp: 18  Temp: 98.6 F (37 C)  TempSrc: Oral  SpO2: 95%    General appearance: Alert; appears uncomfortable, in fetal position on exam chair, crying HEENT: NCAT.  Oropharynx clear.  Lungs: clear to auscultation bilaterally without adventitious breath sounds Heart: regular rate and rhythm.   Abdomen: soft, non-distended; normal active bowel sounds; non-tender to light and deep palpation; + at McBurney's point; + Murphy's sign; + guarding; exam limited due to patient's discomfort Back: no CVA tenderness Extremities: no edema; symmetrical with no gross deformities Skin: warm and dry Neurologic: normal gait Psychological: alert and cooperative; normal mood and affect   ASSESSMENT & PLAN:  1. Lower abdominal pain     Meds ordered this encounter  Medications  . dicyclomine (BENTYL) 20 MG tablet    Sig: Take 1 tablet (20 mg total)  by mouth 2 (two) times daily.    Dispense:  20 tablet    Refill:  0    Order Specific Question:   Supervising Provider    Answer:   Raylene Everts [[id number]]  . alum & mag hydroxide-simeth (MAALOX/MYLANTA) 200-200-20 MG/5ML suspension 30 mL    Unable to rule out appendicitis, gallbladder disease, ectopic pregnancy, ovarian torsion, ovarian cyst, in urgent care setting.  Offered patient further evaluation and management in the ED.  Patient declines at this time and would like to try outpatient therapy first.  Aware of the risk associated with this decision including missed diagnosis, organ damage, organ failure, and/or death.  Patient aware and in agreement.     GI cocktail Rest and push fluids Bentyl prescribed.  Take as directed Follow up with OB/GYN  for further evaluation If you experience new or worsening symptoms return or go to ER such as fever, chills, nausea, vomiting, diarrhea, bloody or dark tarry stools, constipation, urinary symptoms, worsening abdominal discomfort, symptoms that do not improve with medications, inability to keep fluids down, etc...  Reviewed expectations re: course of current medical issues. Questions answered. Outlined signs and symptoms indicating need for more acute intervention. Patient verbalized understanding. After Visit Summary given.   Lestine Box, PA-C 09/06/20 [phone]

## 2020-09-13 NOTE — Progress Notes (Signed)
   GYN VISIT Patient name: Valerie Huber MRN [id number]  Date of birth: 1985-07-27 Chief Complaint:   Pelvic Pain (Wants hysterectomy)  History of Present Illness:   Valerie Huber is a 35 y.o. [phone] female being seen today for follow up regarding:.     Pelvic pain: Her main concern is dysmenorrhea.  Records were reviewed, pt has ha/o endometriosis diagnosed in 2005.  Per pt treated with shots as well as several other medical options.  Also had [place], endometrial ablation in 2019.  Dr. Glo Herring had spoken to her about surgical intervention; however, at that time she wanted to postpone.  She is now ready to consider more permanent options. Today she notes that periods are monthly- considerable pelvic pain requiring ER visits.  Menses usually 2x per month around the 8th and again the 24th.  Taking pain meds (tylenol) with no improvement.  Pain is horrible 10/10, worse than labor pain.  Menses typically 4-5 days- denies HMB, ablation did work to improve the bleeding.  Her main concern is mostly just the pain. Stopped having sex due to discomfort. Also notes pelvic pain outside of her menses- notes pain more than 1/2 of the month.  Patient's last menstrual period was 09/06/2020.  Depression screen [facility] 2/9 09/01/2020 04/12/2020  Decreased Interest 0 0  Down, Depressed, Hopeless 0 0  PHQ - 2 Score 0 0     Review of Systems:   Pertinent items are noted in HPI Denies fever/chills, dizziness, headaches, visual disturbances, fatigue, shortness of breath, chest pain, abdominal pain, vomiting, bowel movements, urination, or intercourse unless otherwise stated above.  Pertinent History Reviewed:  Reviewed past medical,surgical, social, obstetrical and family history.  Reviewed problem list, medications and allergies. Physical Assessment:   Vitals:   09/15/20 1104  BP: 122/76  Pulse: 76  Weight: 167 lb 12.8 oz (76.1 kg)  Height: 5\' 4"  (1.626 m)  Body mass index is 28.8 kg/m.       Physical  Examination:   General appearance: alert, well appearing, and in no distress  Psych: mood appropriate, normal affect  Skin: warm & dry   Cardiovascular: RRR  Respiratory: CTAB  Abdomen: soft, non-tender, no rebound, no guarding  Pelvic: normal external genitalia, vulva, vagina, cervix, uterus and adnexa- diffuse tenderness noted, uterus seems mobile  Extremities: no edema   Chaperone: declined    Assessment & Plan:  1) Pelvic pain due to endometriosis -reviewed conservative options including continuous hormonal options, [place] or surgical intervention -pt does desire to proceed with surgical intervention -discussed ovarian preservation -reviewed risk/benefit of surgery including but not limited to risk of bleeding, infection and injury to surrounding organs -reviewed recovery and hospital expectations -Pt does desire to proceed- plan to review with care team- may consider referral for robotic hysterectomy   Janyth Pupa, DO Attending [place], [place] for [place]

## 2020-09-15 ENCOUNTER — Ambulatory Visit (INDEPENDENT_AMBULATORY_CARE_PROVIDER_SITE_OTHER): Payer: Medicaid Other | Admitting: Obstetrics & Gynecology

## 2020-09-15 ENCOUNTER — Other Ambulatory Visit: Payer: Self-pay

## 2020-09-15 ENCOUNTER — Telehealth: Payer: Self-pay | Admitting: Obstetrics & Gynecology

## 2020-09-15 ENCOUNTER — Encounter: Payer: Self-pay | Admitting: Obstetrics & Gynecology

## 2020-09-15 VITALS — BP 122/76 | HR 76 | Ht 64.0 in | Wt 167.8 lb

## 2020-09-15 DIAGNOSIS — N946 Dysmenorrhea, unspecified: Secondary | ICD-10-CM | POA: Diagnosis not present

## 2020-09-15 DIAGNOSIS — Z8742 Personal history of other diseases of the female genital tract: Secondary | ICD-10-CM

## 2020-09-15 DIAGNOSIS — R102 Pelvic and perineal pain: Secondary | ICD-10-CM | POA: Diagnosis not present

## 2020-09-15 NOTE — Telephone Encounter (Signed)
Patient stated that she was unable to get medical records from 2005, but has decided to go ahead with the robotic Procedure, per patient. Patient wants a returned phone call. Clinical staff will follow up with patient.

## 2020-09-19 NOTE — Telephone Encounter (Signed)
Pt called again about the matter that from her previous appt. She stated that she was told something would be done prior to her next cycle starting.  She stated that it was ridiculous that she has had to wait and has not hear from anyone on the matter and wanted to speak with Dr. 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 Is requesting that someone call her back in a timely manner because this was an urgent matter.

## 2020-09-20 MED ORDER — ORILISSA 150 MG PO TABS: 150.0000 mg | ORAL_TABLET | Freq: Every day | ORAL | 11 refills | Status: AC

## 2020-09-20 NOTE — Telephone Encounter (Signed)
Pt had called in so I called her back.  Discussed that I had reviewed surgical management with Dr. Elonda Husky for best route- plan to repeat exam in office with both myself and Eure for possible vaginal hysterectomy.  Pt wants to get her surgery ASAP, reviewed with patient that we are doing the best we can to get her in ASAP, but typically the process may take a few mos and that this is fairly standard at any office.  Pt agreeable, also sent in [place] to try and help with symptom management until surgery can be scheduled.

## 2020-09-20 NOTE — Telephone Encounter (Signed)
Left message to call back  

## 2020-09-23 ENCOUNTER — Ambulatory Visit
Admission: EM | Admit: 2020-09-23 | Discharge: 2020-09-23 | Disposition: A | Discharge status: Home / Self Care | Payer: Medicaid Other

## 2020-09-23 ENCOUNTER — Other Ambulatory Visit: Payer: Self-pay

## 2020-09-23 DIAGNOSIS — R079 Chest pain, unspecified: Secondary | ICD-10-CM

## 2020-09-23 NOTE — ED Notes (Signed)
.  ucto 

## 2020-09-23 NOTE — ED Triage Notes (Signed)
Pt having cp for past couple of days, has tried antiacid with no relief.  Recently started some new medications.

## 2020-09-23 NOTE — ED Notes (Signed)
Patient is being discharged from the Urgent Care and sent to the Emergency Department via pov. Per B Wurst, PA, patient is in need of higher level of care due to CP. Patient is aware and verbalizes understanding of plan of care. There were no vitals filed for this visit.

## 2020-10-03 ENCOUNTER — Encounter: Payer: Self-pay | Admitting: Obstetrics & Gynecology

## 2020-10-03 ENCOUNTER — Ambulatory Visit (INDEPENDENT_AMBULATORY_CARE_PROVIDER_SITE_OTHER): Payer: Medicaid Other | Admitting: Obstetrics & Gynecology

## 2020-10-03 ENCOUNTER — Other Ambulatory Visit: Payer: Self-pay

## 2020-10-03 VITALS — BP 115/77 | HR 81 | Ht 64.0 in | Wt 164.2 lb

## 2020-10-03 DIAGNOSIS — N946 Dysmenorrhea, unspecified: Secondary | ICD-10-CM | POA: Diagnosis not present

## 2020-10-03 DIAGNOSIS — N809 Endometriosis, unspecified: Secondary | ICD-10-CM

## 2020-10-03 NOTE — Progress Notes (Signed)
   GYN VISIT Patient name: Valerie Huber MRN [id number]  Date of birth: [birth year] Chief Complaint:   Pelvic Pain  History of Present Illness:   Valerie Huber is a 35 y.o. G25P2012 female being seen today for the following concerns:   Dysmenorrhea: Today she notes that the pain continues to be a chronic issue.  She has started the [place], but has not yet seen improvement. Previously reported long-standing h/o endometriosis- diagnosed in 2005.  Op note obtained and reviewed- Stage 2 endometriosis. In review, she notes that periods are sometimes 2x per month with significant dysmenorrhea.  Taking pain meds (tylenol) with no improvement.  Pain is 10/10, worse than labor pain.  Stopped having sex due to discomfort. Also notes pelvic pain outside of her menses- notes pain more than 1/2 of the month. Menses typically 4-5 days- denies HMB, ablation did work to improve the bleeding.   Patient's last menstrual period was 09/06/2020.  Depression screen [facility] 2/9 09/01/2020 04/12/2020  Decreased Interest 0 0  Down, Depressed, Hopeless 0 0  PHQ - 2 Score 0 0     Review of Systems:   Pertinent items are noted in HPI Denies fever/chills, dizziness, headaches, visual disturbances, fatigue, shortness of breath, chest pain,bowel movements, urination, or intercourse unless otherwise stated above.  Pertinent History Reviewed:  Reviewed past medical,surgical, social, obstetrical and family history.  Reviewed problem list, medications and allergies. Physical Assessment:   Vitals:   10/03/20 1011  BP: 115/77  Pulse: 81  Weight: 74.5 kg  Height: 5\' 4"  (1.626 m)  Body mass index is 28.18 kg/m.       Physical Examination:   General appearance: alert, well appearing, and in no distress  Psych: mood appropriate, normal affect  Skin: warm & dry   Cardiovascular: normal heart rate noted  Respiratory: normal respiratory effort, no distress  Abdomen: diffuse lower abdominal tenderness  Pelvic:  normal external genitalia, exam completed by Dr. Elonda Husky- mobility noted x 4 quadrants, descent of uterus appreciated  Extremities: no edema   Chaperone: Dr. Elonda Husky and myself were present for exam    Assessment & Plan:  1) Dysmenorrhea, Endometriosis -Based on exam, suspect total vaginal hysterectomy can be completed -Reviewed risk/benefit and alternatives including but not limited to risk of bleeding, infection and injury to surrounding organs.  Also discussed potential for possible conversion to open hysterectomy.  Questions and concerns were addressed and she desires to proceed at next available -Scheduled reviewed- plan for June 21  No orders of the defined types were placed in this encounter.   Return for surgery to be scheduled for June 21.   Janyth Pupa, DO Attending [place], [facility] for [employer], [place]

## 2020-10-27 NOTE — Patient Instructions (Signed)
Valerie Huber  10/27/2020     @PREFPERIOPPHARMACY @   Your procedure is scheduled on 11/01/2020.   Report to [name] at  [place].M.   Call this number if you have problems the morning of surgery:  [phone]   Remember:  Do not eat or drink after midnight.      Take these medicines the morning of surgery with A SIP OF WATER     bentyl, cymbalta, zofran (if needed), protonix.     Please brush your teeth.  Do not wear jewelry, make-up or nail polish.  Do not wear lotions, powders, or perfumes, or deodorant.  Do not shave 48 hours prior to surgery.  Men may shave face and neck.  Do not bring valuables to the hospital.  [facility] is not responsible for any belongings or valuables.  Contacts, dentures or bridgework may not be worn into surgery.  Leave your suitcase in the car.  After surgery it may be brought to your room.  For patients admitted to the hospital, discharge time will be determined by your treatment team.  Patients discharged the day of surgery will not be allowed to drive home and must have someone with them for 24 hours.      Special instructions:   DO NOT smoke tobacco or vape for 24 hours before your procedure.  Please read over the following fact sheets that you were given. Coughing and Deep Breathing, Surgical Site Infection Prevention, Anesthesia Post-op Instructions, and Care and Recovery After Surgery      Vaginal Hysterectomy, Care After The following information offers guidance on how to care for yourself after your procedure. Your health care provider may also give you more specific instructions. If you have problems or questions, contact your health careprovider. What can I expect after the procedure? After the procedure, it is common to have: Pain in the lower abdomen and vagina. Vaginal bleeding and discharge for up to 1 week. You will need to use a sanitary pad after this procedure. Difficulty having a bowel movement  (constipation). Temporary problems emptying the bladder. Tiredness (fatigue). Poor appetite. Less interest in sex. Feelings of sadness or other emotions. If your ovaries were also removed, it is also common to have symptoms of menopause, such as hot flashes, night sweats, and lack of sleep (insomnia). Follow these instructions at home: Medicines  Take over-the-counter and prescription medicines only as told by your health care provider. Do not take aspirin or NSAIDs, such as ibuprofen. These medicines can cause bleeding. Ask your health care provider if the medicine prescribed to you: Requires you to avoid driving or using machinery. Can cause constipation. You may need to take these actions to prevent or treat constipation: Drink enough fluid to keep your urine pale yellow. Take over-the-counter or prescription medicines. Eat foods that are high in fiber, such as beans, whole grains, and fresh fruits and vegetables. Limit foods that are high in fat and processed sugars, such as fried or sweet foods.  Activity  Rest as told by your health care provider. Return to your normal activities as told by your health care provider. Ask your health care provider what activities are safe for you Avoid sitting for a long time without moving. Get up to take short walks every 1-2 hours. This is important to improve blood flow and breathing. Ask for help if you feel weak or unsteady. Try to have someone home with you for 1-2 weeks to  help you with everyday chores. Do not lift anything that is heavier than 10 lb (4.5 kg), or the limit that you are told, until your health care provider says that it is safe. If you were given a sedative during the procedure, it can affect you for several hours. Do not drive or operate machinery until your health care provider says that it is safe.  Lifestyle Do not use any products that contain nicotine or tobacco. These products include cigarettes, chewing tobacco, and  vaping devices, such as e-cigarettes. These can delay healing after surgery. If you need help quitting, ask your health care provider. Do not drink alcohol until your health care provider approves. General instructions Do not douche, use tampons, or have sex for at least 6 weeks, or as told by your health care provider. If you struggle with physical or emotional changes after your procedure, speak with your health care provider or a therapist. The stitches inside your vagina will dissolve over time and do not need to be taken out. Do not take baths, swim, or use a hot tub until your health care provider approves. You may only be allowed to take showers for 2-3 weeks Wear compression stockings as told by your health care provider. These stockings help to prevent blood clots and reduce swelling in your legs. Keep all follow-up visits. This is important. Contact a health care provider if: Your pain medicine is not helping. You have a fever. You have nausea or vomiting that does not go away. You feel dizzy. You have blood, pus, or a bad-smelling discharge from your vagina more than 1 week after the procedure. You continue to have trouble urinating 3-5 days after the procedure. Get help right away if: You have severe pain in your abdomen or back. You faint. You have heavy vaginal bleeding and blood clots, soaking through a sanitary pad in less than 1 hour. You have chest pain or shortness of breath. You have pain, swelling, or redness in your leg. These symptoms may represent a serious problem that is an emergency. Do not wait to see if the symptoms will go away. Get medical help right away. Call your local emergency services (911 in the [place] Do not drive yourself to the hospital. Summary After the procedure, it is common to have pain, vaginal bleeding, constipation, temporary problems emptying your bladder, and feelings of sadness or other emotions. Take over-the-counter and prescription  medicines only as told by your health care provider. Rest as told by your health care provider. Return to your normal activities as told by your health care provider. Contact a health care provider if your pain medicine is not helping, or you have a fever, dizziness, or trouble urinating several days after the procedure. Get help right away if you have severe pain in your abdomen or back, or if you faint, have heavy bleeding, or have chest pain or shortness of breath. This information is not intended to replace advice given to you by your health care provider. Make sure you discuss any questions you have with your healthcare provider. Document Revised: 01/01/2020 Document Reviewed: 01/01/2020 Elsevier Patient Education  [place] Anesthesia, Adult, Care After This sheet gives you information about how to care for yourself after your procedure. Your health care provider may also give you more specific instructions. If you have problems or questions, contact your health careprovider. What can I expect after the procedure? After the procedure, the following side effects are common: Pain or discomfort  at the IV site. Nausea. Vomiting. Sore throat. Trouble concentrating. Feeling cold or chills. Feeling weak or tired. Sleepiness and fatigue. Soreness and body aches. These side effects can affect parts of the body that were not involved in surgery. Follow these instructions at home: For the time period you were told by your health care provider:  Rest. Do not participate in activities where you could fall or become injured. Do not drive or use machinery. Do not drink alcohol. Do not take sleeping pills or medicines that cause drowsiness. Do not make important decisions or sign legal documents. Do not take care of children on your own.  Eating and drinking Follow any instructions from your health care provider about eating or drinking restrictions. When you feel hungry,  start by eating small amounts of foods that are soft and easy to digest (bland), such as toast. Gradually return to your regular diet. Drink enough fluid to keep your urine pale yellow. If you vomit, rehydrate by drinking water, juice, or clear broth. General instructions If you have sleep apnea, surgery and certain medicines can increase your risk for breathing problems. Follow instructions from your health care provider about wearing your sleep device: Anytime you are sleeping, including during daytime naps. While taking prescription pain medicines, sleeping medicines, or medicines that make you drowsy. Have a responsible adult stay with you for the time you are told. It is important to have someone help care for you until you are awake and alert. Return to your normal activities as told by your health care provider. Ask your health care provider what activities are safe for you. Take over-the-counter and prescription medicines only as told by your health care provider. If you smoke, do not smoke without supervision. Keep all follow-up visits as told by your health care provider. This is important. Contact a health care provider if: You have nausea or vomiting that does not get better with medicine. You cannot eat or drink without vomiting. You have pain that does not get better with medicine. You are unable to pass urine. You develop a skin rash. You have a fever. You have redness around your IV site that gets worse. Get help right away if: You have difficulty breathing. You have chest pain. You have blood in your urine or stool, or you vomit blood. Summary After the procedure, it is common to have a sore throat or nausea. It is also common to feel tired. Have a responsible adult stay with you for the time you are told. It is important to have someone help care for you until you are awake and alert. When you feel hungry, start by eating small amounts of foods that are soft and easy to  digest (bland), such as toast. Gradually return to your regular diet. Drink enough fluid to keep your urine pale yellow. Return to your normal activities as told by your health care provider. Ask your health care provider what activities are safe for you. This information is not intended to replace advice given to you by your health care provider. Make sure you discuss any questions you have with your healthcare provider. Document Revised: 01/14/2020 Document Reviewed: 08/13/2019 Elsevier Patient Education  2022 [place]. How to Use Chlorhexidine for Bathing Chlorhexidine gluconate (CHG) is a germ-killing (antiseptic) solution that is used to clean the skin. It can get rid of the bacteria that normally live on the skin and can keep them away for about 24 hours. To clean your skin with CHG, you  may be given: A CHG solution to use in the shower or as part of a sponge bath. A prepackaged cloth that contains CHG. Cleaning your skin with CHG may help lower the risk for infection: While you are staying in the intensive care unit of the hospital. If you have a vascular access, such as a central line, to provide short-term or long-term access to your veins. If you have a catheter to drain urine from your bladder. If you are on a ventilator. A ventilator is a machine that helps you breathe by moving air in and out of your lungs. After surgery. What are the risks? Risks of using CHG include: A skin reaction. Hearing loss, if CHG gets in your ears. Eye injury, if CHG gets in your eyes and is not rinsed out. The CHG product catching fire. Make sure that you avoid smoking and flames after applying CHG to your skin. Do not use CHG: If you have a chlorhexidine allergy or have previously reacted to chlorhexidine. On babies younger than 57 months of age. How to use CHG solution Use CHG only as told by your health care provider, and follow the instructions on the label. Use the full amount of CHG as  directed. Usually, this is one bottle. During a shower Follow these steps when using CHG solution during a shower (unless your health care provider gives you different instructions): Start the shower. Use your normal soap and shampoo to wash your face and hair. Turn off the shower or move out of the shower stream. Pour the CHG onto a clean washcloth. Do not use any type of brush or rough-edged sponge. Starting at your neck, lather your body down to your toes. Make sure you follow these instructions: If you will be having surgery, pay special attention to the part of your body where you will be having surgery. Scrub this area for at least 1 minute. Do not use CHG on your head or face. If the solution gets into your ears or eyes, rinse them well with water. Avoid your genital area. Avoid any areas of skin that have broken skin, cuts, or scrapes. Scrub your back and under your arms. Make sure to wash skin folds. Let the lather sit on your skin for 1-2 minutes or as long as told by your health care provider. Thoroughly rinse your entire body in the shower. Make sure that all body creases and crevices are rinsed well. Dry off with a clean towel. Do not put any substances on your body afterward--such as powder, lotion, or perfume--unless you are told to do so by your health care provider. Only use lotions that are recommended by the manufacturer. Put on clean clothes or pajamas. If it is the night before your surgery, sleep in clean sheets.  During a sponge bath Follow these steps when using CHG solution during a sponge bath (unless your health care provider gives you different instructions): Use your normal soap and shampoo to wash your face and hair. Pour the CHG onto a clean washcloth. Starting at your neck, lather your body down to your toes. Make sure you follow these instructions: If you will be having surgery, pay special attention to the part of your body where you will be having surgery.  Scrub this area for at least 1 minute. Do not use CHG on your head or face. If the solution gets into your ears or eyes, rinse them well with water. Avoid your genital area. Avoid any areas of skin  that have broken skin, cuts, or scrapes. Scrub your back and under your arms. Make sure to wash skin folds. Let the lather sit on your skin for 1-2 minutes or as long as told by your health care provider. Using a different clean, wet washcloth, thoroughly rinse your entire body. Make sure that all body creases and crevices are rinsed well. Dry off with a clean towel. Do not put any substances on your body afterward--such as powder, lotion, or perfume--unless you are told to do so by your health care provider. Only use lotions that are recommended by the manufacturer. Put on clean clothes or pajamas. If it is the night before your surgery, sleep in clean sheets. How to use CHG prepackaged cloths Only use CHG cloths as told by your health care provider, and follow the instructions on the label. Use the CHG cloth on clean, dry skin. Do not use the CHG cloth on your head or face unless your health care provider tells you to. When washing with the CHG cloth: Avoid your genital area. Avoid any areas of skin that have broken skin, cuts, or scrapes. Before surgery Follow these steps when using a CHG cloth to clean before surgery (unless your health care provider gives you different instructions): Using the CHG cloth, vigorously scrub the part of your body where you will be having surgery. Scrub using a back-and-forth motion for 3 minutes. The area on your body should be completely wet with CHG when you are done scrubbing. Do not rinse. Discard the cloth and let the area air-dry. Do not put any substances on the area afterward, such as powder, lotion, or perfume. Put on clean clothes or pajamas. If it is the night before your surgery, sleep in clean sheets.  For general bathing Follow these steps when  using CHG cloths for general bathing (unless your health care provider gives you different instructions). Use a separate CHG cloth for each area of your body. Make sure you wash between any folds of skin and between your fingers and toes. Wash your body in the following order, switching to a new cloth after each step: The front of your neck, shoulders, and chest. Both of your arms, under your arms, and your hands. Your stomach and groin area, avoiding the genitals. Your right leg and foot. Your left leg and foot. The back of your neck, your back, and your buttocks. Do not rinse. Discard the cloth and let the area air-dry. Do not put any substances on your body afterward--such as powder, lotion, or perfume--unless you are told to do so by your health care provider. Only use lotions that are recommended by the manufacturer. Put on clean clothes or pajamas. Contact a health care provider if: Your skin gets irritated after scrubbing. You have questions about using your solution or cloth. Get help right away if: Your eyes become very red or swollen. Your eyes itch badly. Your skin itches badly and is red or swollen. Your hearing changes. You have trouble seeing. You have swelling or tingling in your mouth or throat. You have trouble breathing. You swallow any chlorhexidine. Summary Chlorhexidine gluconate (CHG) is a germ-killing (antiseptic) solution that is used to clean the skin. Cleaning your skin with CHG may help to lower your risk for infection. You may be given CHG to use for bathing. It may be in a bottle or in a prepackaged cloth to use on your skin. Carefully follow your health care provider's instructions and the instructions on the  product label. Do not use CHG if you have a chlorhexidine allergy. Contact your health care provider if your skin gets irritated after scrubbing. This information is not intended to replace advice given to you by your health care provider. Make sure you  discuss any questions you have with your healthcare provider. Document Revised: 09/11/2019 Document Reviewed: 10/16/2019 Elsevier Patient Education  [place].

## 2020-10-29 ENCOUNTER — Other Ambulatory Visit: Payer: Self-pay

## 2020-10-29 ENCOUNTER — Encounter (HOSPITAL_COMMUNITY): Payer: Self-pay | Admitting: *Deleted

## 2020-10-29 ENCOUNTER — Emergency Department (HOSPITAL_COMMUNITY): Payer: Medicaid Other

## 2020-10-29 ENCOUNTER — Emergency Department (HOSPITAL_COMMUNITY)
Admission: EM | Admit: 2020-10-29 | Discharge: 2020-10-29 | Disposition: A | Discharge status: Home / Self Care | Payer: Medicaid Other | Attending: Emergency Medicine | Admitting: Emergency Medicine

## 2020-10-29 DIAGNOSIS — R0789 Other chest pain: Secondary | ICD-10-CM | POA: Diagnosis not present

## 2020-10-29 DIAGNOSIS — R42 Dizziness and giddiness: Secondary | ICD-10-CM | POA: Insufficient documentation

## 2020-10-29 DIAGNOSIS — R110 Nausea: Secondary | ICD-10-CM | POA: Insufficient documentation

## 2020-10-29 DIAGNOSIS — F17210 Nicotine dependence, cigarettes, uncomplicated: Secondary | ICD-10-CM | POA: Insufficient documentation

## 2020-10-29 DIAGNOSIS — J45909 Unspecified asthma, uncomplicated: Secondary | ICD-10-CM | POA: Insufficient documentation

## 2020-10-29 DIAGNOSIS — R0602 Shortness of breath: Secondary | ICD-10-CM | POA: Diagnosis not present

## 2020-10-29 LAB — CBC
HCT: 34 % — ABNORMAL LOW (ref 36.0–46.0)
Hemoglobin: 11.1 g/dL — ABNORMAL LOW (ref 12.0–15.0)
MCH: 31.2 pg (ref 26.0–34.0)
MCHC: 32.6 g/dL (ref 30.0–36.0)
MCV: 95.5 fL (ref 80.0–100.0)
Platelets: 254 10*3/uL (ref 150–400)
RBC: 3.56 MIL/uL — ABNORMAL LOW (ref 3.87–5.11)
RDW: 11.7 % (ref 11.5–15.5)
WBC: 5.3 10*3/uL (ref 4.0–10.5)
nRBC: 0 % (ref 0.0–0.2)

## 2020-10-29 LAB — BASIC METABOLIC PANEL
Anion gap: 7 (ref 5–15)
BUN: 11 mg/dL (ref 6–20)
CO2: 27 mmol/L (ref 22–32)
Calcium: 9.3 mg/dL (ref 8.9–10.3)
Chloride: 104 mmol/L (ref 98–111)
Creatinine, Ser: 0.68 mg/dL (ref 0.44–1.00)
GFR, Estimated: >60 mL/min (ref >60)
Glucose, Bld: 109 mg/dL — ABNORMAL HIGH (ref 70–99)
Potassium: 3.8 mmol/L (ref 3.5–5.1)
Sodium: 138 mmol/L (ref 135–145)

## 2020-10-29 LAB — TROPONIN I (HIGH SENSITIVITY): Troponin I (High Sensitivity): <2 ng/L (ref <18)

## 2020-10-29 NOTE — ED Provider Notes (Signed)
[facility] EMERGENCY DEPARTMENT Provider Note   CSN: [id number] Arrival date & time: 10/29/20  0740     History Chief Complaint  Patient presents with   Chest Pain    Marycatherine Maniscalco is a 35 y.o. female.   Chest Pain  This patient is a very pleasant 35 year old female, history of asthma, endometriosis and some irregular menstrual cycles.  She takes a daily allergy medication such as cetirizine but denies any other medications whatsoever.  The patient reports to me that starting last night while she was helping her daughter flat iron her hair there was lots of smoke that was coming off of the machine, she states this is fairly normal however during this process last night she felt increased amounts of chest tightness.  She does not have this in the past but felt uncomfortable even going to sleep last night because of the symptoms.  There was some nausea, some dizziness, this has been going on for at least 9 hours, there is no coughing or fever, no swelling of the legs, no exertional symptoms, no medications given prior to arrival.  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   Endometriosis    Irregular periods 12/31/2014   Pelvic pain in female 12/31/2014   Pregnant 04/14/2015   Second degree uterine prolapse 12/31/2014   Seizures ([place])    Vaginal Pap smear, abnormal     Patient Active Problem List   Diagnosis Date Noted   Seasonal allergic rhinitis due to pollen 09/01/2020   Encounter for examination following treatment at hospital 04/12/2020   Heme positive stool 06/30/2019   Chronic diarrhea 06/30/2019   Rectal bleeding    Complex regional pain syndrome type 1 of right lower extremity 05/20/2017   Depression 11/30/2015   H/O: substance abuse ([place]) 11/30/2015   History of seizures 11/30/2015   Marijuana use 07/05/2015   Moderate dysplasia of cervix (CIN II)  at endocervical margin of LEEP specimen 01/22/2015    Pelvic pain in female 12/31/2014   Endometriosis determined by laparoscopy 12/31/2014   Second degree uterine prolapse 12/31/2014   Irregular periods 12/31/2014   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/21/2014   Hematemesis with nausea    Anaphylaxis 09/04/2011    Past Surgical History:  Procedure Laterality Date   BIOPSY  07/06/2019   Procedure: BIOPSY;  Surgeon: Daneil Dolin, MD;  [facility];  Service: Endoscopy;;  colon    COLONOSCOPY WITH PROPOFOL N/A 07/06/2019   one 3 mm polyp hyperplastic, segmental biopsies negative   DILITATION & CURRETTAGE/HYSTROSCOPY WITH NOVASURE ABLATION N/A 10/08/2017   Procedure: DILATATION & CURETTAGE/HYSTEROSCOPY WITH MINERVA  ENDOMETRIAL ABLATION (Procedure #2);  Surgeon: [name], MD;  Location: [facility] ORS;  Service: Gynecology;  Laterality: N/A;   ESOPHAGOGASTRODUODENOSCOPY N/A 12/10/2014   SLF: hematoemesis due to moderate erosive gastritis, duoedentitis, and duodenal ulcer   LEEP     POLYPECTOMY  07/06/2019   Procedure: POLYPECTOMY;  Surgeon: Daneil Dolin, MD;  Location: [facility];  Service: Endoscopy;;   surgery for endometriosis  2005   TUBAL LIGATION Bilateral 10/08/2017   Procedure: BILATERAL TUBAL STERILIZATION WITH FALLOPE RINGS APPLICATION (Procedure #1);  Surgeon: [name], MD;  Location: [facility] ORS;  Service: Gynecology;  Laterality: Bilateral;     OB History     Gravida  3   Para  2   Term  2   Preterm  AB  1   Living  2      SAB      IAB  1   Ectopic      Multiple  0   Live Births  2           Family History  Problem Relation Age of Onset   Cancer Mother        kidney    Asthma Mother    Hypertension Mother    Colon polyps Sister        67, large polyp, done at 71    Cancer Paternal Grandmother    Asthma Son    Colon cancer Neg Hx     Social History   Tobacco Use   Smoking status: Some Days    Packs/day: 0.25    Years: 14.00    Pack years: 3.50    Types:  Cigarettes   Smokeless tobacco: Never   Tobacco comments:    quit in 2017   Vaping Use   Vaping Use: Never used  Substance Use Topics   Alcohol use: Yes    Alcohol/week: 0.0 standard drinks    Comment: occ   Drug use: Yes    Types: Marijuana    Home Medications Prior to Admission medications   Medication Sig Start Date End Date Taking? Authorizing Provider  acetaminophen (TYLENOL) 500 MG tablet Take 500-1,500 mg by mouth every 6 (six) hours as needed (for pain.).     [provider]  azelastine (ASTELIN) 0.1 % nasal spray Place 1 spray into both nostrils 2 (two) times daily. Use in each nostril as directed 09/01/20   Lindell Spar, MD  dicyclomine (BENTYL) 20 MG tablet Take 1 tablet (20 mg total) by mouth 2 (two) times daily. 09/06/20   Wurst, [place], PA-C  DULoxetine (CYMBALTA) 30 MG capsule TAKE 1 CAPSULE(30 MG) BY MOUTH DAILY 05/16/20   Lindell Spar, MD  levocetirizine (XYZAL) 5 MG tablet Take 1 tablet (5 mg total) by mouth every evening. 09/01/20   Lindell Spar, MD  loperamide (IMODIUM A-D) 2 MG tablet Take 2-4 mg by mouth 4 (four) times daily as needed for diarrhea or loose stools.    [provider]  ondansetron (ZOFRAN ODT) 4 MG disintegrating tablet Take 1 tablet (4 mg total) by mouth every 8 (eight) hours as needed for nausea or vomiting. 09/01/20   Lindell Spar, MD  pantoprazole (PROTONIX) 40 MG tablet Take 1 tablet (40 mg total) by mouth daily. 09/01/20   Lindell Spar, MD  silver sulfADIAZINE (SILVADENE) 1 % cream Apply to area TID 12/14/19   Florian Buff, MD    Allergies    Ibuprofen and Flexeril [cyclobenzaprine]  Review of Systems   Review of Systems  Cardiovascular:  Positive for chest pain.  All other systems reviewed and are negative.  Physical Exam Updated Vital Signs BP 113/79   Pulse 81   Temp 98.2 F (36.8 C) (Oral)   Resp 18   Ht 1.626 m (5\' 4" )   Wt 75.3 kg   LMP 10/25/2020   SpO2 96%   BMI 28.49 kg/m   Physical  Exam Vitals and nursing note reviewed.  Constitutional:      General: She is not in acute distress.    Appearance: She is well-developed.  HENT:     Head: Normocephalic and atraumatic.     Mouth/Throat:     Pharynx: No oropharyngeal exudate.  Eyes:     General:  No scleral icterus.       Right eye: No discharge.        Left eye: No discharge.     Conjunctiva/sclera: Conjunctivae normal.     Pupils: Pupils are equal, round, and reactive to light.  Neck:     Thyroid: No thyromegaly.     Vascular: No JVD.  Cardiovascular:     Rate and Rhythm: Normal rate and regular rhythm.     Heart sounds: Normal heart sounds. No murmur heard.   No friction rub. No gallop.  Pulmonary:     Effort: Pulmonary effort is normal. No respiratory distress.     Breath sounds: Normal breath sounds. No wheezing or rales.     Comments: Nurse chaperone megan present for exam Chest:     Chest wall: No tenderness.  Abdominal:     General: Bowel sounds are normal. There is no distension.     Palpations: Abdomen is soft. There is no mass.     Tenderness: There is no abdominal tenderness.  Musculoskeletal:        General: No tenderness. Normal range of motion.     Cervical back: Normal range of motion and neck supple.  Lymphadenopathy:     Cervical: No cervical adenopathy.  Skin:    General: Skin is warm and dry.     Findings: No erythema or rash.  Neurological:     Mental Status: She is alert.     Coordination: Coordination normal.  Psychiatric:        Behavior: Behavior normal.    ED Results / Procedures / Treatments   Labs (all labs ordered are listed, but only abnormal results are displayed) Labs Reviewed  CBC - Abnormal; Notable for the following components:      Result Value   RBC 3.56 (*)    Hemoglobin 11.1 (*)    HCT 34.0 (*)    All other components within normal limits  BASIC METABOLIC PANEL - Abnormal; Notable for the following components:   Glucose, Bld 109 (*)    All other  components within normal limits  TROPONIN I (HIGH SENSITIVITY)    EKG EKG Interpretation  Date/Time:  Saturday October 29 2020 07:55:45 EDT Ventricular Rate:  87 PR Interval:  144 QRS Duration: 89 QT Interval:  373 QTC Calculation: 449 R Axis:   -27 Text Interpretation: Sinus rhythm Borderline left axis deviation  no changes since 4/22 Confirmed by Noemi Chapel [id number]) on 10/29/2020 7:59:42 AM  Radiology DG Chest Port 1 View  Result Date: 10/29/2020 CLINICAL DATA:  Chest tightness. Additional history provided: Patient reports chest tightness, shortness of breath and dizziness since last night. Asthma. EXAM: PORTABLE CHEST 1 VIEW COMPARISON:  Prior chest radiographs 08/15/2020 and earlier. FINDINGS: Heart size within normal limits. No appreciable airspace consolidation. No evidence of pleural effusion or pneumothorax. No acute bony abnormality identified. IMPRESSION: No evidence of active cardiopulmonary disease. Electronically Signed   By: Kellie Simmering DO   On: 10/29/2020 08:23    Procedures Procedures   Medications Ordered in ED Medications - No data to display  ED Course  I have reviewed the triage vital signs and the nursing notes.  Pertinent labs & imaging results that were available during my care of the patient were reviewed by me and considered in my medical decision making (see chart for details).    MDM Rules/Calculators/A&P  The patient smells heavily of marijuana, states she smokes daily, no changes in routine, EKG is normal and unremarkable, will get basic labs, 1 set of troponin, anticipate discharge, doubt that this is cardiac, has no similarities to pulmonary embolism especially without tachycardia shortness of breath, hypoxia, leg swelling or any other risk factors.  Testing normal VS normal  Vitals:   10/29/20 0830 10/29/20 0900  BP: 127/88 113/79  Pulse:    Resp: 20 18  Temp:    SpO2: 98% 96%    Pt encouraged to f/u with  PCP Stop MJ Pt agreeable.  Final Clinical Impression(s) / ED Diagnoses Final diagnoses:  Chest heaviness    Rx / DC Orders ED Discharge Orders     None        Noemi Chapel, MD 10/29/20 [phone]

## 2020-10-29 NOTE — Discharge Instructions (Addendum)
Your tests are normal You have no signs of heart attack I would recommend you stop marijuana use as this can cause lung damage ER for worsening symptoms.

## 2020-10-29 NOTE — ED Triage Notes (Signed)
Pt c/o mid chest tightness/heaviness, SOB, nausea, dizzy that started last night at 2300. Pt reports she has been up all night with the pain and couldn't sleep.

## 2020-10-30 ENCOUNTER — Emergency Department (HOSPITAL_COMMUNITY): Payer: Medicaid Other

## 2020-10-30 ENCOUNTER — Emergency Department (HOSPITAL_COMMUNITY)
Admission: EM | Admit: 2020-10-30 | Discharge: 2020-10-30 | Disposition: A | Discharge status: Home / Self Care | Payer: Medicaid Other | Attending: Emergency Medicine | Admitting: Emergency Medicine

## 2020-10-30 ENCOUNTER — Encounter (HOSPITAL_COMMUNITY): Payer: Self-pay | Admitting: *Deleted

## 2020-10-30 DIAGNOSIS — R0602 Shortness of breath: Secondary | ICD-10-CM | POA: Diagnosis not present

## 2020-10-30 DIAGNOSIS — R0789 Other chest pain: Secondary | ICD-10-CM | POA: Diagnosis not present

## 2020-10-30 DIAGNOSIS — R42 Dizziness and giddiness: Secondary | ICD-10-CM | POA: Diagnosis not present

## 2020-10-30 DIAGNOSIS — E049 Nontoxic goiter, unspecified: Secondary | ICD-10-CM | POA: Diagnosis not present

## 2020-10-30 DIAGNOSIS — R7989 Other specified abnormal findings of blood chemistry: Secondary | ICD-10-CM | POA: Diagnosis not present

## 2020-10-30 DIAGNOSIS — J45909 Unspecified asthma, uncomplicated: Secondary | ICD-10-CM | POA: Insufficient documentation

## 2020-10-30 DIAGNOSIS — R079 Chest pain, unspecified: Secondary | ICD-10-CM | POA: Diagnosis not present

## 2020-10-30 DIAGNOSIS — F17210 Nicotine dependence, cigarettes, uncomplicated: Secondary | ICD-10-CM | POA: Diagnosis not present

## 2020-10-30 LAB — URINALYSIS, ROUTINE W REFLEX MICROSCOPIC
Bacteria, UA: NONE SEEN
Bilirubin Urine: NEGATIVE
Glucose, UA: NEGATIVE mg/dL
Ketones, ur: 20 mg/dL — AB
Leukocytes,Ua: NEGATIVE
Nitrite: NEGATIVE
Protein, ur: NEGATIVE mg/dL
Specific Gravity, Urine: 1.004 — ABNORMAL LOW (ref 1.005–1.030)
pH: 8 (ref 5.0–8.0)

## 2020-10-30 LAB — CBC
HCT: 38.6 % (ref 36.0–46.0)
Hemoglobin: 12.4 g/dL (ref 12.0–15.0)
MCH: 30.7 pg (ref 26.0–34.0)
MCHC: 32.1 g/dL (ref 30.0–36.0)
MCV: 95.5 fL (ref 80.0–100.0)
Platelets: 286 10*3/uL (ref 150–400)
RBC: 4.04 MIL/uL (ref 3.87–5.11)
RDW: 11.8 % (ref 11.5–15.5)
WBC: 5.2 10*3/uL (ref 4.0–10.5)
nRBC: 0 % (ref 0.0–0.2)

## 2020-10-30 LAB — RAPID URINE DRUG SCREEN, HOSP PERFORMED
Amphetamines: POSITIVE — AB
Barbiturates: NOT DETECTED
Benzodiazepines: NOT DETECTED
Cocaine: NOT DETECTED
Opiates: NOT DETECTED
Tetrahydrocannabinol: POSITIVE — AB

## 2020-10-30 LAB — BASIC METABOLIC PANEL
Anion gap: 11 (ref 5–15)
BUN: 9 mg/dL (ref 6–20)
CO2: 25 mmol/L (ref 22–32)
Calcium: 9.7 mg/dL (ref 8.9–10.3)
Chloride: 100 mmol/L (ref 98–111)
Creatinine, Ser: 0.8 mg/dL (ref 0.44–1.00)
GFR, Estimated: >60 mL/min (ref >60)
Glucose, Bld: 96 mg/dL (ref 70–99)
Potassium: 4.3 mmol/L (ref 3.5–5.1)
Sodium: 136 mmol/L (ref 135–145)

## 2020-10-30 LAB — POC URINE PREG, ED: Preg Test, Ur: NEGATIVE

## 2020-10-30 LAB — TROPONIN I (HIGH SENSITIVITY)
Troponin I (High Sensitivity): <2 ng/L (ref <18)
Troponin I (High Sensitivity): <2 ng/L (ref <18)

## 2020-10-30 LAB — D-DIMER, QUANTITATIVE: D-Dimer, Quant: 0.73 ug/mL-FEU — ABNORMAL HIGH (ref 0.00–0.50)

## 2020-10-30 MED ORDER — IOHEXOL 350 MG/ML SOLN
80.0000 mL | Freq: Once | INTRAVENOUS | Status: AC | PRN
  Administered 2020-10-30: 80 mL via INTRAVENOUS

## 2020-10-30 NOTE — ED Provider Notes (Signed)
[facility] EMERGENCY DEPARTMENT Provider Note   CSN: [id number] Arrival date & time: 10/30/20  1453     History Chief Complaint  Patient presents with   Chest Pain    Valerie Huber is a 35 y.o. female.  HPI She presents for evaluation of chest tightness, shortness of breath, occasional cough, and dizziness.  Evaluated yesterday for similar symptoms and discharged with reassuring evaluation.  She states her symptoms are somewhat better today but persistent so she decided to come back.  She remains concerned that she might have been poisoned by something that could have been put into a drink that she drank, either 2 or 3 days ago.  She uses marijuana but no other drugs.  No prior similar problems.  She does not smoke other products.  There are no other known modifying factors.    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   Endometriosis    Irregular periods 12/31/2014   Pelvic pain in female 12/31/2014   Pregnant 04/14/2015   Second degree uterine prolapse 12/31/2014   Seizures ([place])    Vaginal Pap smear, abnormal     Patient Active Problem List   Diagnosis Date Noted   Seasonal allergic rhinitis due to pollen 09/01/2020   Encounter for examination following treatment at hospital 04/12/2020   Heme positive stool 06/30/2019   Chronic diarrhea 06/30/2019   Rectal bleeding    Complex regional pain syndrome type 1 of right lower extremity 05/20/2017   Depression 11/30/2015   H/O: substance abuse ([place]) 11/30/2015   History of seizures 11/30/2015   Marijuana use 07/05/2015   Moderate dysplasia of cervix (CIN II)  at endocervical margin of LEEP specimen 01/22/2015   Pelvic pain in female 12/31/2014   Endometriosis determined by laparoscopy 12/31/2014   Second degree uterine prolapse 12/31/2014   Irregular periods 12/31/2014   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/21/2014   Hematemesis with nausea    Anaphylaxis 09/04/2011    Past  Surgical History:  Procedure Laterality Date   BIOPSY  07/06/2019   Procedure: BIOPSY;  Surgeon: Daneil Dolin, MD;  [facility];  Service: Endoscopy;;  colon    COLONOSCOPY WITH PROPOFOL N/A 07/06/2019   one 3 mm polyp hyperplastic, segmental biopsies negative   DILITATION & CURRETTAGE/HYSTROSCOPY WITH NOVASURE ABLATION N/A 10/08/2017   Procedure: DILATATION & CURETTAGE/HYSTEROSCOPY WITH MINERVA  ENDOMETRIAL ABLATION (Procedure #2);  Surgeon: [name], MD;  Location: [facility] ORS;  Service: Gynecology;  Laterality: N/A;   ESOPHAGOGASTRODUODENOSCOPY N/A 12/10/2014   SLF: hematoemesis due to moderate erosive gastritis, duoedentitis, and duodenal ulcer   LEEP     POLYPECTOMY  07/06/2019   Procedure: POLYPECTOMY;  Surgeon: Daneil Dolin, MD;  [facility]: [facility];  Service: Endoscopy;;   surgery for endometriosis  2005   TUBAL LIGATION Bilateral 10/08/2017   Procedure: BILATERAL TUBAL STERILIZATION WITH FALLOPE RINGS APPLICATION (Procedure #1);  Surgeon: [name], MD;  Location: [facility];  Service: Gynecology;  Laterality: Bilateral;     OB History     Gravida  3   Para  2   Term  2   Preterm      AB  1   Living  2      SAB      IAB  1   Ectopic      Multiple  0   Live Births  2  Family History  Problem Relation Age of Onset   Cancer Mother        kidney    Asthma Mother    Hypertension Mother    Colon polyps Sister        94, large polyp, done at 31    Cancer Paternal Grandmother    Asthma Son    Colon cancer Neg Hx     Social History   Tobacco Use   Smoking status: Some Days    Packs/day: 0.25    Years: 14.00    Pack years: 3.50    Types: Cigarettes   Smokeless tobacco: Never   Tobacco comments:    quit in 2017   Vaping Use   Vaping Use: Never used  Substance Use Topics   Alcohol use: Yes    Alcohol/week: 0.0 standard drinks    Comment: occ   Drug use: Yes    Types: Marijuana    Home Medications Prior  to Admission medications   Medication Sig Start Date End Date Taking? Authorizing Provider  acetaminophen (TYLENOL) 500 MG tablet Take 500-1,500 mg by mouth every 6 (six) hours as needed (for pain.).    Yes [provider]  levocetirizine (XYZAL) 5 MG tablet Take 1 tablet (5 mg total) by mouth every evening. 09/01/20  Yes Lindell Spar, MD  pantoprazole (PROTONIX) 40 MG tablet Take 1 tablet (40 mg total) by mouth daily. 09/01/20  Yes Lindell Spar, MD  azelastine (ASTELIN) 0.1 % nasal spray Place 1 spray into both nostrils 2 (two) times daily. Use in each nostril as directed Patient not taking: No sig reported 09/01/20   Lindell Spar, MD  dicyclomine (BENTYL) 20 MG tablet Take 1 tablet (20 mg total) by mouth 2 (two) times daily. Patient not taking: No sig reported 09/06/20   Wurst, [place], PA-C  DULoxetine (CYMBALTA) 30 MG capsule TAKE 1 CAPSULE(30 MG) BY MOUTH DAILY Patient not taking: No sig reported 05/16/20   Lindell Spar, MD  ondansetron (ZOFRAN ODT) 4 MG disintegrating tablet Take 1 tablet (4 mg total) by mouth every 8 (eight) hours as needed for nausea or vomiting. Patient not taking: No sig reported 09/01/20   Lindell Spar, MD  silver sulfADIAZINE (SILVADENE) 1 % cream Apply to area TID Patient not taking: Reported on 10/30/2020 12/14/19   Florian Buff, MD    Allergies    Flexeril [cyclobenzaprine] and Ibuprofen  Review of Systems   Review of Systems  All other systems reviewed and are negative.  Physical Exam Updated Vital Signs BP 111/82 (BP Location: Right Arm)   Pulse 73   Temp 98.6 F (37 C)   Resp 16   LMP 10/25/2020   SpO2 100%   Physical Exam Vitals and nursing note reviewed.  Constitutional:      Appearance: She is well-developed. She is not ill-appearing.  HENT:     Head: Normocephalic and atraumatic.     Right Ear: External ear normal.     Left Ear: External ear normal.  Eyes:     Conjunctiva/sclera: Conjunctivae normal.     Pupils:  Pupils are equal, round, and reactive to light.  Neck:     Trachea: Phonation normal.  Cardiovascular:     Rate and Rhythm: Normal rate and regular rhythm.     Heart sounds: Normal heart sounds.  Pulmonary:     Effort: Pulmonary effort is normal. No respiratory distress.     Breath sounds: Normal breath sounds.  No stridor. No wheezing or rales.  Chest:     Chest wall: No tenderness.  Abdominal:     Palpations: Abdomen is soft.     Tenderness: There is no abdominal tenderness.  Musculoskeletal:        General: Normal range of motion.     Cervical back: Normal range of motion and neck supple.  Skin:    General: Skin is warm and dry.  Neurological:     Mental Status: She is alert and oriented to person, place, and time.     Cranial Nerves: No cranial nerve deficit.     Sensory: No sensory deficit.     Motor: No abnormal muscle tone.     Coordination: Coordination normal.  Psychiatric:        Mood and Affect: Mood normal.        Behavior: Behavior normal.        Thought Content: Thought content normal.        Judgment: Judgment normal.    ED Results / Procedures / Treatments   Labs (all labs ordered are listed, but only abnormal results are displayed) Labs Reviewed  URINALYSIS, ROUTINE W REFLEX MICROSCOPIC - Abnormal; Notable for the following components:      Result Value   Color, Urine STRAW (*)    Specific Gravity, Urine 1.004 (*)    Hgb urine dipstick MODERATE (*)    Ketones, ur 20 (*)    All other components within normal limits  RAPID URINE DRUG SCREEN, HOSP PERFORMED - Abnormal; Notable for the following components:   Amphetamines POSITIVE (*)    Tetrahydrocannabinol POSITIVE (*)    All other components within normal limits  D-DIMER, QUANTITATIVE - Abnormal; Notable for the following components:   D-Dimer, Quant 0.73 (*)    All other components within normal limits  BASIC METABOLIC PANEL  CBC  POC URINE PREG, ED  TROPONIN I (HIGH SENSITIVITY)  TROPONIN I (HIGH  SENSITIVITY)    EKG EKG Interpretation  Date/Time:  Sunday October 30 2020 15:16:07 EDT Ventricular Rate:  102 PR Interval:  132 QRS Duration: 82 QT Interval:  318 QTC Calculation: 414 R Axis:   -36 Text Interpretation: Sinus tachycardia Left axis deviation Minimal voltage criteria for LVH, may be normal variant ( R in aVL ) Nonspecific T wave abnormality Abnormal ECG since last tracing no significant change Confirmed by Daleen Bo [phone]) on 10/30/2020 3:25:40 PM  Radiology DG Chest 2 View  Result Date: 10/30/2020 CLINICAL DATA:  Chest pain. EXAM: CHEST - 2 VIEW COMPARISON:  October 29, 2020 FINDINGS: Cardiomediastinal silhouette is normal. Mediastinal contours appear intact. There is no evidence of focal airspace consolidation, pleural effusion or pneumothorax. Osseous structures are without acute abnormality. Soft tissues are grossly normal. IMPRESSION: No active cardiopulmonary disease. Electronically Signed   By: Fidela Salisbury M.D.   On: 10/30/2020 16:10   CT Angio Chest PE W/Cm &/Or Wo Cm  Result Date: 10/30/2020 CLINICAL DATA:  Chest pain rule out pulmonary embolism. Positive D-dimer. EXAM: CT ANGIOGRAPHY CHEST WITH CONTRAST TECHNIQUE: Multidetector CT imaging of the chest was performed using the standard protocol during bolus administration of intravenous contrast. Multiplanar CT image reconstructions and MIPs were obtained to evaluate the vascular anatomy. CONTRAST:  67mL OMNIPAQUE IOHEXOL 350 MG/ML SOLN COMPARISON:  Chest two-view 10/30/2020 FINDINGS: Cardiovascular: Satisfactory opacification of the pulmonary arteries to the segmental level. No evidence of pulmonary embolism. Normal heart size. No pericardial effusion. Mediastinum/Nodes: Negative for mass or adenopathy. Mild enlargement of  the thyroid bilaterally compatible with goiter. No focal thyroid nodule. Lungs/Pleura: Lungs are well aerated and clear. No infiltrate effusion or mass Upper Abdomen: Negative Musculoskeletal:  Negative Review of the MIP images confirms the above findings. IMPRESSION: Negative for pulmonary embolism.  No acute abnormality chest Thyroid goiter Electronically Signed   By: Franchot Gallo M.D.   On: 10/30/2020 18:13   DG Chest Port 1 View  Result Date: 10/29/2020 CLINICAL DATA:  Chest tightness. Additional history provided: Patient reports chest tightness, shortness of breath and dizziness since last night. Asthma. EXAM: PORTABLE CHEST 1 VIEW COMPARISON:  Prior chest radiographs 08/15/2020 and earlier. FINDINGS: Heart size within normal limits. No appreciable airspace consolidation. No evidence of pleural effusion or pneumothorax. No acute bony abnormality identified. IMPRESSION: No evidence of active cardiopulmonary disease. Electronically Signed   By: Kellie Simmering DO   On: 10/29/2020 08:23    Procedures Procedures   Medications Ordered in ED Medications  iohexol (OMNIPAQUE) 350 MG/ML injection 80 mL (80 mLs Intravenous Contrast Given 10/30/20 1757)    ED Course  I have reviewed the triage vital signs and the nursing notes.  Pertinent labs & imaging results that were available during my care of the patient were reviewed by me and considered in my medical decision making (see chart for details).    MDM Rules/Calculators/A&P                           Patient Vitals for the past 24 hrs:  BP Temp Pulse Resp SpO2  10/30/20 1952 111/82 -- 73 16 100 %  10/30/20 1750 118/87 -- 79 18 100 %  10/30/20 1616 (!) 126/94 -- 78 (!) 22 100 %  10/30/20 1525 (!) 115/95 98.6 F (37 C) (!) 112 (!) 24 100 %    7:56 PM Reevaluation with update and discussion. After initial assessment and treatment, an updated evaluation reveals she is comfortable has no further complaints.  Vital signs are reassuring findings discussed with the patient and all questions were answered. Daleen Bo   Medical Decision Making:  This patient is presenting for evaluation of chest tightness and shortness of breath,  which does require a range of treatment options, and is a complaint that involves a moderate risk of morbidity and mortality. The differential diagnoses include pneumonia, bronchitis, anxiety, substance abuse. I decided to review old records, and in summary [place] middle-aged female presenting for evaluation of persistent symptoms, previously evaluated yesterday.  She is overall healthy, alcohol and smoking marijuana regularly.  She is low risk for PE.  She does not take estrogens.  I did not additional historical information from anyone.  Clinical Laboratory Tests Ordered, included CBC, Metabolic panel, and troponin, pregnancy test, UDS, urinalysis .  D-dimer.  Review indicates normal except D-dimer positive, amphetamines present, THC present. Radiologic Tests Ordered, included CT chest rule out PE.  I independently Visualized: Radiograph images, which show no acute pulmonary disorder.  Thyroid goiter present.  Patient is informed of that.    Critical Interventions-clinical evaluation, laboratory testing, CT imaging, observation reassessment  After These Interventions, the Patient was reevaluated and was found stable for discharge.  No acute pulmonary chronic disorders.  Incidental thyroid goiter present.  Patient will follow-up about that.  Drug screen showed amphetamines and THC.  It is possible that she was exposed amphetamines causing her to feel uncomfortable.  No indication for hospitalization at this time.  CRITICAL CARE-no Performed by: Daleen Bo  Nursing  Notes Reviewed/ Care Coordinated Applicable Imaging Reviewed Interpretation of Laboratory Data incorporated into ED treatment  The patient appears reasonably screened and/or stabilized for discharge and I doubt any other medical condition or other [facility] requiring further screening, evaluation, or treatment in the ED at this time prior to discharge.  Plan: Home Medications-continue usual; Home Treatments-rest, fluids; return here if  the recommended treatment, does not improve the symptoms; Recommended follow up-PCP for thyroid check within 1 month.     Final Clinical Impression(s) / ED Diagnoses Final diagnoses:  Nonspecific chest pain  Thyroid goiter    Rx / DC Orders ED Discharge Orders     None        Daleen Bo, MD 10/30/20 [phone]

## 2020-10-30 NOTE — ED Notes (Signed)
Pt. States dizziness is gone. Pt. Ambulated to restroom with steady gait.

## 2020-10-30 NOTE — Discharge Instructions (Addendum)
The testing today did not show any serious acute problems.  The drug screen did show amphetamines, and THC.  Hopefully her symptoms will improve with time.  The CT did show a thyroid goiter which will need follow-up by primary care doctor.  Typically these are not a problem however they may want to check thyroid levels to make sure there are no complications at this time.  Try to see a doctor within the next month, for that.

## 2020-10-30 NOTE — ED Triage Notes (Signed)
Chest pain and dizziness, onset several days ago after smoking weed.seen yesterday for same

## 2020-10-30 NOTE — ED Notes (Signed)
Pt. To CT

## 2020-10-30 NOTE — ED Notes (Signed)
Attempted to obtain IV. Requested another nurse attempt.

## 2020-10-31 ENCOUNTER — Telehealth: Payer: Self-pay | Admitting: *Deleted

## 2020-10-31 ENCOUNTER — Encounter (HOSPITAL_COMMUNITY): Payer: Self-pay

## 2020-10-31 ENCOUNTER — Other Ambulatory Visit (HOSPITAL_COMMUNITY): Payer: Medicaid Other

## 2020-10-31 ENCOUNTER — Encounter (HOSPITAL_COMMUNITY)
Admission: RE | Admit: 2020-10-31 | Discharge: 2020-10-31 | Disposition: A | Discharge status: Home / Self Care | Payer: Medicaid Other | Source: Ambulatory Visit | Attending: Obstetrics & Gynecology | Admitting: Obstetrics & Gynecology

## 2020-10-31 NOTE — Telephone Encounter (Signed)
Transition Care Management Follow-up Telephone Call Date of discharge and from where: 10/30/2020 - [name] How have you been since you were released from the hospital? "Fine" Any questions or concerns? No  Items Reviewed: Did the pt receive and understand the discharge instructions provided? Yes  Medications obtained and verified? Yes  Other? No  Any new allergies since your discharge? No  Dietary orders reviewed? No Do you have support at home? Yes    Functional Questionnaire: (I = Independent and D = Dependent) ADLs: I  Bathing/Dressing- I  Meal Prep- I  Eating- I  Maintaining continence- I  Transferring/Ambulation- I  Managing Meds- I  Follow up appointments reviewed:  PCP Hospital f/u appt confirmed? No   Specialist Hospital f/u appt confirmed? No   Are transportation arrangements needed? No  If their condition worsens, is the pt aware to call PCP or go to the Emergency Dept.? Yes Was the patient provided with contact information for the PCP's office or ED? Yes Was to pt encouraged to call back with questions or concerns? Yes

## 2020-11-01 ENCOUNTER — Ambulatory Visit (HOSPITAL_COMMUNITY)
Admission: RE | Admit: 2020-11-01 | Payer: Medicaid Other | Source: Home / Self Care | Admitting: Obstetrics & Gynecology

## 2020-11-01 ENCOUNTER — Encounter (HOSPITAL_COMMUNITY): Admission: RE | Payer: Self-pay | Source: Home / Self Care

## 2020-11-01 SURGERY — HYSTERECTOMY, VAGINAL
Anesthesia: General

## 2020-11-07 ENCOUNTER — Other Ambulatory Visit: Payer: Self-pay

## 2020-11-07 ENCOUNTER — Ambulatory Visit: Payer: Medicaid Other | Admitting: Nurse Practitioner

## 2020-11-07 ENCOUNTER — Encounter: Payer: Self-pay | Admitting: Nurse Practitioner

## 2020-11-07 VITALS — BP 103/71 | HR 80 | Temp 97.7°F | Ht 64.0 in | Wt 162.0 lb

## 2020-11-07 DIAGNOSIS — R079 Chest pain, unspecified: Secondary | ICD-10-CM | POA: Insufficient documentation

## 2020-11-07 MED ORDER — ALUM & MAG HYDROXIDE-SIMETH 200-200-20 MG/5ML PO SUSP
30.0000 mL | Freq: Four times a day (QID) | ORAL | 0 refills | Status: DC | PRN
Start: 2020-11-07 — End: 2021-05-09
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
## 2020-11-08 ENCOUNTER — Encounter: Payer: Medicaid Other | Admitting: Obstetrics & Gynecology

## 2020-11-16 ENCOUNTER — Telehealth (INDEPENDENT_AMBULATORY_CARE_PROVIDER_SITE_OTHER): Payer: Medicaid Other | Admitting: Licensed Clinical Social Worker

## 2020-11-16 ENCOUNTER — Other Ambulatory Visit: Payer: Self-pay

## 2020-11-16 DIAGNOSIS — F320 Major depressive disorder, single episode, mild: Secondary | ICD-10-CM

## 2020-11-16 NOTE — Progress Notes (Signed)
No show

## 2020-11-18 ENCOUNTER — Encounter: Payer: Medicaid Other | Admitting: Obstetrics & Gynecology

## 2020-11-26 ENCOUNTER — Other Ambulatory Visit: Payer: Self-pay | Admitting: Internal Medicine

## 2020-11-26 DIAGNOSIS — J301 Allergic rhinitis due to pollen: Secondary | ICD-10-CM

## 2020-11-30 ENCOUNTER — Encounter (HOSPITAL_COMMUNITY): Payer: Self-pay | Admitting: *Deleted

## 2020-11-30 ENCOUNTER — Emergency Department (HOSPITAL_COMMUNITY)
Admission: EM | Admit: 2020-11-30 | Discharge: 2020-11-30 | Disposition: A | Discharge status: Home / Self Care | Payer: Medicaid Other | Attending: Emergency Medicine | Admitting: Emergency Medicine

## 2020-11-30 ENCOUNTER — Other Ambulatory Visit: Payer: Self-pay

## 2020-11-30 DIAGNOSIS — Z87891 Personal history of nicotine dependence: Secondary | ICD-10-CM | POA: Insufficient documentation

## 2020-11-30 DIAGNOSIS — J45909 Unspecified asthma, uncomplicated: Secondary | ICD-10-CM | POA: Insufficient documentation

## 2020-11-30 DIAGNOSIS — R202 Paresthesia of skin: Secondary | ICD-10-CM | POA: Diagnosis not present

## 2020-11-30 DIAGNOSIS — R200 Anesthesia of skin: Secondary | ICD-10-CM | POA: Diagnosis present

## 2020-11-30 LAB — CBC WITH DIFFERENTIAL/PLATELET
Abs Immature Granulocytes: 0 K/uL (ref 0.00–0.07)
Basophils Absolute: 0 K/uL (ref 0.0–0.1)
Basophils Relative: 0 %
Eosinophils Absolute: 0.1 K/uL (ref 0.0–0.5)
Eosinophils Relative: 1 %
HCT: 37.5 % (ref 36.0–46.0)
Hemoglobin: 12 g/dL (ref 12.0–15.0)
Immature Granulocytes: 0 %
Lymphocytes Relative: 64 %
Lymphs Abs: 3.2 K/uL (ref 0.7–4.0)
MCH: 30.9 pg (ref 26.0–34.0)
MCHC: 32 g/dL (ref 30.0–36.0)
MCV: 96.6 fL (ref 80.0–100.0)
Monocytes Absolute: 0.4 K/uL (ref 0.1–1.0)
Monocytes Relative: 8 %
Neutro Abs: 1.4 K/uL — ABNORMAL LOW (ref 1.7–7.7)
Neutrophils Relative %: 27 %
Platelets: 240 K/uL (ref 150–400)
RBC: 3.88 MIL/uL (ref 3.87–5.11)
RDW: 12.2 % (ref 11.5–15.5)
WBC: 5 K/uL (ref 4.0–10.5)
nRBC: 0 % (ref 0.0–0.2)

## 2020-11-30 LAB — BASIC METABOLIC PANEL
Anion gap: 7 (ref 5–15)
BUN: 9 mg/dL (ref 6–20)
CO2: 25 mmol/L (ref 22–32)
Calcium: 9.2 mg/dL (ref 8.9–10.3)
Chloride: 105 mmol/L (ref 98–111)
Creatinine, Ser: 0.74 mg/dL (ref 0.44–1.00)
GFR, Estimated: >60 mL/min (ref >60)
Glucose, Bld: 82 mg/dL (ref 70–99)
Potassium: 4.1 mmol/L (ref 3.5–5.1)
Sodium: 137 mmol/L (ref 135–145)

## 2020-11-30 NOTE — ED Triage Notes (Signed)
Pt c/o right arm pain, tingling, heavy, numb feeling x one hour; pt denies any injury

## 2020-11-30 NOTE — ED Provider Notes (Signed)
[facility] EMERGENCY DEPARTMENT Provider Note   CSN: [id number] Arrival date & time: 11/30/20  1723     History Chief Complaint  Patient presents with   Arm Pain    Right arm     Valerie Huber is a 35 y.o. female.  Pt reports she has numbness in her right arm.  Pt reports her hand feels numb.  Pt has some pain in right posterior shoulder.  Pt denies any injury  Pt reports she normally sleeps on right side but did not notice any numbness this am.    The history is provided by the patient. No language interpreter was used.  Arm Pain This is a new problem. The current episode started 1 to 2 hours ago. The problem occurs constantly. The problem has been gradually worsening. Nothing aggravates the symptoms. Nothing relieves the symptoms. She has tried nothing for the symptoms. The treatment provided no relief.      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   Endometriosis    Irregular periods 12/31/2014   Pelvic pain in female 12/31/2014   Pregnant 04/14/2015   Second degree uterine prolapse 12/31/2014   Seizures ([place])    Vaginal Pap smear, abnormal     Patient Active Problem List   Diagnosis Date Noted   Chest pain 11/07/2020   Seasonal allergic rhinitis due to pollen 09/01/2020   Encounter for examination following treatment at hospital 04/12/2020   Heme positive stool 06/30/2019   Chronic diarrhea 06/30/2019   Rectal bleeding    Complex regional pain syndrome type 1 of right lower extremity 05/20/2017   Depression 11/30/2015   H/O: substance abuse ([place]) 11/30/2015   History of seizures 11/30/2015   Marijuana use 07/05/2015   Moderate dysplasia of cervix (CIN II)  at endocervical margin of LEEP specimen 01/22/2015   Pelvic pain in female 12/31/2014   Endometriosis determined by laparoscopy 12/31/2014   Second degree uterine prolapse 12/31/2014   Irregular periods 12/31/2014   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/21/2014    Hematemesis with nausea    Anaphylaxis 09/04/2011    Past Surgical History:  Procedure Laterality Date   BIOPSY  07/06/2019   Procedure: BIOPSY;  Surgeon: Daneil Dolin, MD;  [facility];  Service: Endoscopy;;  colon    COLONOSCOPY WITH PROPOFOL N/A 07/06/2019   one 3 mm polyp hyperplastic, segmental biopsies negative   DILITATION & CURRETTAGE/HYSTROSCOPY WITH NOVASURE ABLATION N/A 10/08/2017   Procedure: DILATATION & CURETTAGE/HYSTEROSCOPY WITH MINERVA  ENDOMETRIAL ABLATION (Procedure #2);  Surgeon: [name], MD;  Location: [facility];  Service: Gynecology;  Laterality: N/A;   ESOPHAGOGASTRODUODENOSCOPY N/A 12/10/2014   SLF: hematoemesis due to moderate erosive gastritis, duoedentitis, and duodenal ulcer   LEEP     POLYPECTOMY  07/06/2019   Procedure: POLYPECTOMY;  Surgeon: Daneil Dolin, MD;  [facility];  Service: Endoscopy;;   surgery for endometriosis  2005   TUBAL LIGATION Bilateral 10/08/2017   Procedure: BILATERAL TUBAL STERILIZATION WITH FALLOPE RINGS APPLICATION (Procedure #1);  Surgeon: [name], MD;  Location: [facility];  Service: Gynecology;  Laterality: Bilateral;     OB History     Gravida  3   Para  2   Term  2   Preterm      AB  1   Living  2      SAB      IAB  1  Ectopic      Multiple  0   Live Births  2           Family History  Problem Relation Age of Onset   Cancer Mother        kidney    Asthma Mother    Hypertension Mother    Colon polyps Sister        59, large polyp, done at 2    Cancer Paternal Grandmother    Asthma Son    Colon cancer Neg Hx     Social History   Tobacco Use   Smoking status: Former    Packs/day: 0.25    Years: 14.00    Pack years: 3.50    Types: Cigarettes   Smokeless tobacco: Never   Tobacco comments:    quit in 2017   Vaping Use   Vaping Use: Never used  Substance Use Topics   Alcohol use: Yes    Alcohol/week: 0.0 standard drinks    Comment: occ    Drug use: Yes    Types: Marijuana    Home Medications Prior to Admission medications   Medication Sig Start Date End Date Taking? Authorizing Provider  acetaminophen (TYLENOL) 500 MG tablet Take 500-1,500 mg by mouth every 6 (six) hours as needed (for pain.).     [provider]  alum & mag hydroxide-simeth (MYLANTA) 200-200-20 MG/5ML suspension Take 30 mLs by mouth every 6 (six) hours as needed for indigestion or heartburn. 11/07/20   Noreene Larsson, NP  levocetirizine (XYZAL) 5 MG tablet TAKE 1 TABLET(5 MG) BY MOUTH EVERY EVENING 11/28/20   Noreene Larsson, NP  pantoprazole (PROTONIX) 40 MG tablet Take 1 tablet (40 mg total) by mouth daily. 09/01/20   Lindell Spar, MD    Allergies    Flexeril [cyclobenzaprine] and Ibuprofen  Review of Systems   Review of Systems  Musculoskeletal:  Positive for myalgias.  Neurological:  Positive for numbness.  All other systems reviewed and are negative.  Physical Exam Updated Vital Signs BP (!) 137/104   Pulse 96   Temp 99.3 F (37.4 C) (Oral)   Resp 18   Ht 5\' 4"  (1.626 m)   Wt 72.6 kg   SpO2 100%   BMI 27.46 kg/m   Physical Exam Vitals and nursing note reviewed.  Constitutional:      Appearance: She is well-developed.  HENT:     Head: Normocephalic.  Cardiovascular:     Rate and Rhythm: Normal rate.  Pulmonary:     Effort: Pulmonary effort is normal.  Abdominal:     General: There is no distension.  Musculoskeletal:        General: Normal range of motion.     Cervical back: Normal range of motion.  Skin:    General: Skin is warm.     Capillary Refill: Capillary refill takes less than 2 seconds.  Neurological:     General: No focal deficit present.     Mental Status: She is alert and oriented to person, place, and time.    ED Results / Procedures / Treatments   Labs (all labs ordered are listed, but only abnormal results are displayed) Labs Reviewed  CBC WITH DIFFERENTIAL/PLATELET - Abnormal; Notable for the  following components:      Result Value   Neutro Abs 1.4 (*)    All other components within normal limits  BASIC METABOLIC PANEL    EKG None  Radiology No results found.  Procedures  Procedures   Medications Ordered in ED Medications - No data to display  ED Course  I have reviewed the triage vital signs and the nursing notes.  Pertinent labs & imaging results that were available during my care of the patient were reviewed by me and considered in my medical decision making (see chart for details).    MDM Rules/Calculators/A&P                           MDM:  both hands are warm,  equal cap refill bilat hands.  No discoloration  Labs normal.  Pt advised to follow up with primary care.  Tylenol if pain   Final Clinical Impression(s) / ED Diagnoses Final diagnoses:  Paresthesia    Rx / DC Orders ED Discharge Orders     None     An After Visit Summary was printed and given to the patient.    Sidney Ace 11/30/20 2043    Isla Pence, MD 11/30/20 2322

## 2020-11-30 NOTE — Discharge Instructions (Addendum)
See your Physician for recheck in 2-3 days.  Return if any problems. °

## 2020-12-01 ENCOUNTER — Telehealth: Payer: Self-pay

## 2020-12-01 NOTE — Telephone Encounter (Signed)
Transition Care Management Unsuccessful Follow-up Telephone Call  Date of discharge and from where:  11/30/2020[facility]   Attempts:  1st Attempt  Reason for unsuccessful TCM follow-up call:  Left voice message

## 2020-12-02 NOTE — Telephone Encounter (Signed)
Transition Care Management Unsuccessful Follow-up Telephone Call  Date of discharge and from where:  11/30/2020[facility]  Attempts:  2nd Attempt  Reason for unsuccessful TCM follow-up call:  Left voice message

## 2020-12-05 NOTE — Telephone Encounter (Signed)
Transition Care Management Unsuccessful Follow-up Telephone Call  Date of discharge and from where:  11/30/2020 from [facility]  Attempts:  3rd Attempt  Reason for unsuccessful TCM follow-up call:  Unable to reach patient

## 2020-12-08 ENCOUNTER — Other Ambulatory Visit: Payer: Self-pay | Admitting: Obstetrics & Gynecology

## 2020-12-08 NOTE — Patient Instructions (Signed)
Valerie Huber  12/08/2020     '@PREFPERIOPPHARMACY'$ @   Your procedure is scheduled on  12/14/2020.   Report to [name] at  0700  A.M.   Call this number if you have problems the morning of surgery:  [phone]   Remember:  Do not eat after midnight.   You may drink clear liquids until 0430 AM 12/14/2020 .     Clear liquids allowed are:                    Water, Juice (non-citric and without pulp - diabetics please choose diet or no sugar options), Carbonated beverages - (diabetics please choose diet or no sugar options), Clear Tea, Black Coffee only (no creamer, milk or cream including half and half), Plain Jell-O only (diabetics please choose diet or no sugar options), Gatorade (diabetics please choose diet or no sugar options), and Plain Popsicles only    At 0430 AM 12/14/2020. Drink your carb drink. You may not have anything else to drink after this.    Take these medicines the morning of surgery with A SIP OF WATER                                        protonix.      Do not wear jewelry, make-up or nail polish.  Do not wear lotions, powders, or perfumes, or deodorant.  Do not shave 48 hours prior to surgery.  Men may shave face and neck.  Do not bring valuables to the hospital.  [facility] is not responsible for any belongings or valuables.  Contacts, dentures or bridgework may not be worn into surgery.  Leave your suitcase in the car.  After surgery it may be brought to your room.  For patients admitted to the hospital, discharge time will be determined by your treatment team.  Patients discharged the day of surgery will not be allowed to drive home and must have someone with them for 24 hours.    Special instructions:    DO NOT smoke tobacco or vape for 24 hours before your procedure.   Please read over the following fact sheets that you were given. Coughing and Deep Breathing, Surgical Site Infection Prevention, Anesthesia Post-op Instructions,  and Care and Recovery After Surgery      Vaginal Hysterectomy, Care After The following information offers guidance on how to care for yourself after your procedure. Your health care provider may also give you more specific instructions. If you have problems or questions, contact your health careprovider. What can I expect after the procedure? After the procedure, it is common to have: Pain in the lower abdomen and vagina. Vaginal bleeding and discharge for up to 1 week. You will need to use a sanitary pad after this procedure. Difficulty having a bowel movement (constipation). Temporary problems emptying the bladder. Tiredness (fatigue). Poor appetite. Less interest in sex. Feelings of sadness or other emotions. If your ovaries were also removed, it is also common to have symptoms of menopause, such as hot flashes, night sweats, and lack of sleep (insomnia). Follow these instructions at home: Medicines  Take over-the-counter and prescription medicines only as told by your health care provider. Do not take aspirin or NSAIDs, such as ibuprofen. These medicines can cause bleeding. Ask your health care provider if the medicine prescribed to you: Requires you  to avoid driving or using machinery. Can cause constipation. You may need to take these actions to prevent or treat constipation: Drink enough fluid to keep your urine pale yellow. Take over-the-counter or prescription medicines. Eat foods that are high in fiber, such as beans, whole grains, and fresh fruits and vegetables. Limit foods that are high in fat and processed sugars, such as fried or sweet foods.  Activity  Rest as told by your health care provider. Return to your normal activities as told by your health care provider. Ask your health care provider what activities are safe for you Avoid sitting for a long time without moving. Get up to take short walks every 1-2 hours. This is important to improve blood flow and  breathing. Ask for help if you feel weak or unsteady. Try to have someone home with you for 1-2 weeks to help you with everyday chores. Do not lift anything that is heavier than 10 lb (4.5 kg), or the limit that you are told, until your health care provider says that it is safe. If you were given a sedative during the procedure, it can affect you for several hours. Do not drive or operate machinery until your health care provider says that it is safe.  Lifestyle Do not use any products that contain nicotine or tobacco. These products include cigarettes, chewing tobacco, and vaping devices, such as e-cigarettes. These can delay healing after surgery. If you need help quitting, ask your health care provider. Do not drink alcohol until your health care provider approves. General instructions Do not douche, use tampons, or have sex for at least 6 weeks, or as told by your health care provider. If you struggle with physical or emotional changes after your procedure, speak with your health care provider or a therapist. The stitches inside your vagina will dissolve over time and do not need to be taken out. Do not take baths, swim, or use a hot tub until your health care provider approves. You may only be allowed to take showers for 2-3 weeks Wear compression stockings as told by your health care provider. These stockings help to prevent blood clots and reduce swelling in your legs. Keep all follow-up visits. This is important. Contact a health care provider if: Your pain medicine is not helping. You have a fever. You have nausea or vomiting that does not go away. You feel dizzy. You have blood, pus, or a bad-smelling discharge from your vagina more than 1 week after the procedure. You continue to have trouble urinating 3-5 days after the procedure. Get help right away if: You have severe pain in your abdomen or back. You faint. You have heavy vaginal bleeding and blood clots, soaking through a  sanitary pad in less than 1 hour. You have chest pain or shortness of breath. You have pain, swelling, or redness in your leg. These symptoms may represent a serious problem that is an emergency. Do not wait to see if the symptoms will go away. Get medical help right away. Call your local emergency services (911 in the [place] Do not drive yourself to the hospital. Summary After the procedure, it is common to have pain, vaginal bleeding, constipation, temporary problems emptying your bladder, and feelings of sadness or other emotions. Take over-the-counter and prescription medicines only as told by your health care provider. Rest as told by your health care provider. Return to your normal activities as told by your health care provider. Contact a health care provider if your  pain medicine is not helping, or you have a fever, dizziness, or trouble urinating several days after the procedure. Get help right away if you have severe pain in your abdomen or back, or if you faint, have heavy bleeding, or have chest pain or shortness of breath. This information is not intended to replace advice given to you by your health care provider. Make sure you discuss any questions you have with your healthcare provider. Document Revised: 01/01/2020 Document Reviewed: 01/01/2020 Elsevier Patient Education  [place] Anesthesia, Adult, Care After This sheet gives you information about how to care for yourself after your procedure. Your health care provider may also give you more specific instructions. If you have problems or questions, contact your health careprovider. What can I expect after the procedure? After the procedure, the following side effects are common: Pain or discomfort at the IV site. Nausea. Vomiting. Sore throat. Trouble concentrating. Feeling cold or chills. Feeling weak or tired. Sleepiness and fatigue. Soreness and body aches. These side effects can affect parts of the  body that were not involved in surgery. Follow these instructions at home: For the time period you were told by your health care provider:  Rest. Do not participate in activities where you could fall or become injured. Do not drive or use machinery. Do not drink alcohol. Do not take sleeping pills or medicines that cause drowsiness. Do not make important decisions or sign legal documents. Do not take care of children on your own.  Eating and drinking Follow any instructions from your health care provider about eating or drinking restrictions. When you feel hungry, start by eating small amounts of foods that are soft and easy to digest (bland), such as toast. Gradually return to your regular diet. Drink enough fluid to keep your urine pale yellow. If you vomit, rehydrate by drinking water, juice, or clear broth. General instructions If you have sleep apnea, surgery and certain medicines can increase your risk for breathing problems. Follow instructions from your health care provider about wearing your sleep device: Anytime you are sleeping, including during daytime naps. While taking prescription pain medicines, sleeping medicines, or medicines that make you drowsy. Have a responsible adult stay with you for the time you are told. It is important to have someone help care for you until you are awake and alert. Return to your normal activities as told by your health care provider. Ask your health care provider what activities are safe for you. Take over-the-counter and prescription medicines only as told by your health care provider. If you smoke, do not smoke without supervision. Keep all follow-up visits as told by your health care provider. This is important. Contact a health care provider if: You have nausea or vomiting that does not get better with medicine. You cannot eat or drink without vomiting. You have pain that does not get better with medicine. You are unable to pass  urine. You develop a skin rash. You have a fever. You have redness around your IV site that gets worse. Get help right away if: You have difficulty breathing. You have chest pain. You have blood in your urine or stool, or you vomit blood. Summary After the procedure, it is common to have a sore throat or nausea. It is also common to feel tired. Have a responsible adult stay with you for the time you are told. It is important to have someone help care for you until you are awake and alert. When you feel  hungry, start by eating small amounts of foods that are soft and easy to digest (bland), such as toast. Gradually return to your regular diet. Drink enough fluid to keep your urine pale yellow. Return to your normal activities as told by your health care provider. Ask your health care provider what activities are safe for you. This information is not intended to replace advice given to you by your health care provider. Make sure you discuss any questions you have with your healthcare provider. Document Revised: 01/14/2020 Document Reviewed: 08/13/2019 Elsevier Patient Education  2022 [place]. How to Use Chlorhexidine for Bathing Chlorhexidine gluconate (CHG) is a germ-killing (antiseptic) solution that is used to clean the skin. It can get rid of the bacteria that normally live on the skin and can keep them away for about 24 hours. To clean your skin with CHG, you may be given: A CHG solution to use in the shower or as part of a sponge bath. A prepackaged cloth that contains CHG. Cleaning your skin with CHG may help lower the risk for infection: While you are staying in the intensive care unit of the hospital. If you have a vascular access, such as a central line, to provide short-term or long-term access to your veins. If you have a catheter to drain urine from your bladder. If you are on a ventilator. A ventilator is a machine that helps you breathe by moving air in and out of your  lungs. After surgery. What are the risks? Risks of using CHG include: A skin reaction. Hearing loss, if CHG gets in your ears. Eye injury, if CHG gets in your eyes and is not rinsed out. The CHG product catching fire. Make sure that you avoid smoking and flames after applying CHG to your skin. Do not use CHG: If you have a chlorhexidine allergy or have previously reacted to chlorhexidine. On babies younger than 60 months of age. How to use CHG solution Use CHG only as told by your health care provider, and follow the instructions on the label. Use the full amount of CHG as directed. Usually, this is one bottle. During a shower Follow these steps when using CHG solution during a shower (unless your health care provider gives you different instructions): Start the shower. Use your normal soap and shampoo to wash your face and hair. Turn off the shower or move out of the shower stream. Pour the CHG onto a clean washcloth. Do not use any type of brush or rough-edged sponge. Starting at your neck, lather your body down to your toes. Make sure you follow these instructions: If you will be having surgery, pay special attention to the part of your body where you will be having surgery. Scrub this area for at least 1 minute. Do not use CHG on your head or face. If the solution gets into your ears or eyes, rinse them well with water. Avoid your genital area. Avoid any areas of skin that have broken skin, cuts, or scrapes. Scrub your back and under your arms. Make sure to wash skin folds. Let the lather sit on your skin for 1-2 minutes or as long as told by your health care provider. Thoroughly rinse your entire body in the shower. Make sure that all body creases and crevices are rinsed well. Dry off with a clean towel. Do not put any substances on your body afterward--such as powder, lotion, or perfume--unless you are told to do so by your health care provider. Only use  lotions that are recommended  by the manufacturer. Put on clean clothes or pajamas. If it is the night before your surgery, sleep in clean sheets.  During a sponge bath Follow these steps when using CHG solution during a sponge bath (unless your health care provider gives you different instructions): Use your normal soap and shampoo to wash your face and hair. Pour the CHG onto a clean washcloth. Starting at your neck, lather your body down to your toes. Make sure you follow these instructions: If you will be having surgery, pay special attention to the part of your body where you will be having surgery. Scrub this area for at least 1 minute. Do not use CHG on your head or face. If the solution gets into your ears or eyes, rinse them well with water. Avoid your genital area. Avoid any areas of skin that have broken skin, cuts, or scrapes. Scrub your back and under your arms. Make sure to wash skin folds. Let the lather sit on your skin for 1-2 minutes or as long as told by your health care provider. Using a different clean, wet washcloth, thoroughly rinse your entire body. Make sure that all body creases and crevices are rinsed well. Dry off with a clean towel. Do not put any substances on your body afterward--such as powder, lotion, or perfume--unless you are told to do so by your health care provider. Only use lotions that are recommended by the manufacturer. Put on clean clothes or pajamas. If it is the night before your surgery, sleep in clean sheets. How to use CHG prepackaged cloths Only use CHG cloths as told by your health care provider, and follow the instructions on the label. Use the CHG cloth on clean, dry skin. Do not use the CHG cloth on your head or face unless your health care provider tells you to. When washing with the CHG cloth: Avoid your genital area. Avoid any areas of skin that have broken skin, cuts, or scrapes. Before surgery Follow these steps when using a CHG cloth to clean before surgery  (unless your health care provider gives you different instructions): Using the CHG cloth, vigorously scrub the part of your body where you will be having surgery. Scrub using a back-and-forth motion for 3 minutes. The area on your body should be completely wet with CHG when you are done scrubbing. Do not rinse. Discard the cloth and let the area air-dry. Do not put any substances on the area afterward, such as powder, lotion, or perfume. Put on clean clothes or pajamas. If it is the night before your surgery, sleep in clean sheets.  For general bathing Follow these steps when using CHG cloths for general bathing (unless your health care provider gives you different instructions). Use a separate CHG cloth for each area of your body. Make sure you wash between any folds of skin and between your fingers and toes. Wash your body in the following order, switching to a new cloth after each step: The front of your neck, shoulders, and chest. Both of your arms, under your arms, and your hands. Your stomach and groin area, avoiding the genitals. Your right leg and foot. Your left leg and foot. The back of your neck, your back, and your buttocks. Do not rinse. Discard the cloth and let the area air-dry. Do not put any substances on your body afterward--such as powder, lotion, or perfume--unless you are told to do so by your health care provider. Only use lotions that  are recommended by the manufacturer. Put on clean clothes or pajamas. Contact a health care provider if: Your skin gets irritated after scrubbing. You have questions about using your solution or cloth. Get help right away if: Your eyes become very red or swollen. Your eyes itch badly. Your skin itches badly and is red or swollen. Your hearing changes. You have trouble seeing. You have swelling or tingling in your mouth or throat. You have trouble breathing. You swallow any chlorhexidine. Summary Chlorhexidine gluconate (CHG) is a  germ-killing (antiseptic) solution that is used to clean the skin. Cleaning your skin with CHG may help to lower your risk for infection. You may be given CHG to use for bathing. It may be in a bottle or in a prepackaged cloth to use on your skin. Carefully follow your health care provider's instructions and the instructions on the product label. Do not use CHG if you have a chlorhexidine allergy. Contact your health care provider if your skin gets irritated after scrubbing. This information is not intended to replace advice given to you by your health care provider. Make sure you discuss any questions you have with your healthcare provider. Document Revised: 09/11/2019 Document Reviewed: 10/16/2019 Elsevier Patient Education  [place].

## 2020-12-09 ENCOUNTER — Other Ambulatory Visit: Payer: Self-pay

## 2020-12-09 ENCOUNTER — Encounter (HOSPITAL_COMMUNITY)
Admission: RE | Admit: 2020-12-09 | Discharge: 2020-12-09 | Disposition: A | Discharge status: Home / Self Care | Payer: Medicaid Other | Source: Ambulatory Visit | Attending: Obstetrics & Gynecology | Admitting: Obstetrics & Gynecology

## 2020-12-09 DIAGNOSIS — Z01812 Encounter for preprocedural laboratory examination: Secondary | ICD-10-CM | POA: Diagnosis not present

## 2020-12-09 LAB — URINALYSIS, ROUTINE W REFLEX MICROSCOPIC
Glucose, UA: NEGATIVE mg/dL
Ketones, ur: 5 mg/dL — AB
Leukocytes,Ua: NEGATIVE
Nitrite: NEGATIVE
Protein, ur: 30 mg/dL — AB
Specific Gravity, Urine: 1.041 — ABNORMAL HIGH (ref 1.005–1.030)
pH: 5 (ref 5.0–8.0)

## 2020-12-09 LAB — COMPREHENSIVE METABOLIC PANEL
ALT: 17 U/L (ref 0–44)
AST: 15 U/L (ref 15–41)
Albumin: 3.8 g/dL (ref 3.5–5.0)
Alkaline Phosphatase: 54 U/L (ref 38–126)
Anion gap: 5 (ref 5–15)
BUN: 12 mg/dL (ref 6–20)
CO2: 26 mmol/L (ref 22–32)
Calcium: 8.9 mg/dL (ref 8.9–10.3)
Chloride: 108 mmol/L (ref 98–111)
Creatinine, Ser: 0.59 mg/dL (ref 0.44–1.00)
GFR, Estimated: >60 mL/min (ref >60)
Glucose, Bld: 109 mg/dL — ABNORMAL HIGH (ref 70–99)
Potassium: 3.6 mmol/L (ref 3.5–5.1)
Sodium: 139 mmol/L (ref 135–145)
Total Bilirubin: 0.3 mg/dL (ref 0.3–1.2)
Total Protein: 6.6 g/dL (ref 6.5–8.1)

## 2020-12-09 LAB — CBC
HCT: 36 % (ref 36.0–46.0)
Hemoglobin: 11.6 g/dL — ABNORMAL LOW (ref 12.0–15.0)
MCH: 30.8 pg (ref 26.0–34.0)
MCHC: 32.2 g/dL (ref 30.0–36.0)
MCV: 95.5 fL (ref 80.0–100.0)
Platelets: 241 10*3/uL (ref 150–400)
RBC: 3.77 MIL/uL — ABNORMAL LOW (ref 3.87–5.11)
RDW: 11.9 % (ref 11.5–15.5)
WBC: 4.8 10*3/uL (ref 4.0–10.5)
nRBC: 0 % (ref 0.0–0.2)

## 2020-12-09 LAB — HCG, QUANTITATIVE, PREGNANCY: hCG, Beta Chain, Quant, S: <1 m[IU]/mL (ref <5)

## 2020-12-09 LAB — TYPE AND SCREEN
ABO/RH(D): A POS
Antibody Screen: NEGATIVE

## 2020-12-09 LAB — RAPID HIV SCREEN (HIV 1/2 AB+AG)
HIV 1/2 Antibodies: NONREACTIVE
HIV-1 P24 Antigen - HIV24: NONREACTIVE

## 2020-12-14 ENCOUNTER — Encounter (HOSPITAL_COMMUNITY): Payer: Self-pay | Admitting: Obstetrics & Gynecology

## 2020-12-14 ENCOUNTER — Encounter (HOSPITAL_COMMUNITY)
Admission: RE | Disposition: A | Discharge status: Home / Self Care | Payer: Self-pay | Source: Home / Self Care | Attending: Obstetrics & Gynecology

## 2020-12-14 ENCOUNTER — Other Ambulatory Visit: Payer: Self-pay | Admitting: Obstetrics & Gynecology

## 2020-12-14 ENCOUNTER — Ambulatory Visit (HOSPITAL_COMMUNITY): Payer: Medicaid Other | Admitting: Certified Registered"

## 2020-12-14 ENCOUNTER — Ambulatory Visit (HOSPITAL_COMMUNITY)
Admission: RE | Admit: 2020-12-14 | Discharge: 2020-12-14 | Disposition: A | Discharge status: Home / Self Care | Payer: Medicaid Other | Attending: Obstetrics & Gynecology | Admitting: Obstetrics & Gynecology

## 2020-12-14 DIAGNOSIS — Z8249 Family history of ischemic heart disease and other diseases of the circulatory system: Secondary | ICD-10-CM | POA: Diagnosis not present

## 2020-12-14 DIAGNOSIS — Z886 Allergy status to analgesic agent status: Secondary | ICD-10-CM | POA: Diagnosis not present

## 2020-12-14 DIAGNOSIS — Z8371 Family history of colonic polyps: Secondary | ICD-10-CM | POA: Insufficient documentation

## 2020-12-14 DIAGNOSIS — D259 Leiomyoma of uterus, unspecified: Secondary | ICD-10-CM | POA: Diagnosis not present

## 2020-12-14 DIAGNOSIS — N938 Other specified abnormal uterine and vaginal bleeding: Secondary | ICD-10-CM | POA: Insufficient documentation

## 2020-12-14 DIAGNOSIS — Z8742 Personal history of other diseases of the female genital tract: Secondary | ICD-10-CM

## 2020-12-14 DIAGNOSIS — N941 Unspecified dyspareunia: Secondary | ICD-10-CM | POA: Diagnosis not present

## 2020-12-14 DIAGNOSIS — D251 Intramural leiomyoma of uterus: Secondary | ICD-10-CM | POA: Insufficient documentation

## 2020-12-14 DIAGNOSIS — N946 Dysmenorrhea, unspecified: Secondary | ICD-10-CM | POA: Insufficient documentation

## 2020-12-14 DIAGNOSIS — Z79899 Other long term (current) drug therapy: Secondary | ICD-10-CM | POA: Diagnosis not present

## 2020-12-14 DIAGNOSIS — Z809 Family history of malignant neoplasm, unspecified: Secondary | ICD-10-CM | POA: Diagnosis not present

## 2020-12-14 DIAGNOSIS — Z8051 Family history of malignant neoplasm of kidney: Secondary | ICD-10-CM | POA: Insufficient documentation

## 2020-12-14 DIAGNOSIS — Z87891 Personal history of nicotine dependence: Secondary | ICD-10-CM | POA: Diagnosis not present

## 2020-12-14 DIAGNOSIS — Z825 Family history of asthma and other chronic lower respiratory diseases: Secondary | ICD-10-CM | POA: Diagnosis not present

## 2020-12-14 HISTORY — PX: VAGINAL HYSTERECTOMY: SHX2639

## 2020-12-14 SURGERY — HYSTERECTOMY, VAGINAL
Anesthesia: General

## 2020-12-14 MED ORDER — DEXMEDETOMIDINE (PRECEDEX) IN NS 20 MCG/5ML (4 MCG/ML) IV SYRINGE
PREFILLED_SYRINGE | INTRAVENOUS | Status: DC | PRN
  Administered 2020-12-14 (×2): 8 ug via INTRAVENOUS

## 2020-12-14 MED ORDER — ONDANSETRON 8 MG PO TBDP: 8.0000 mg | ORAL_TABLET | Freq: Three times a day (TID) | ORAL | 0 refills | Status: DC | PRN

## 2020-12-14 MED ORDER — FENTANYL CITRATE (PF) 100 MCG/2ML IJ SOLN
INTRAMUSCULAR | Status: DC | PRN
  Administered 2020-12-14 (×3): 50 ug via INTRAVENOUS
  Administered 2020-12-14: 100 ug via INTRAVENOUS

## 2020-12-14 MED ORDER — ORAL CARE MOUTH RINSE: 15.0000 mL | Freq: Once | OROMUCOSAL | Status: AC

## 2020-12-14 MED ORDER — METOCLOPRAMIDE HCL 5 MG/ML IJ SOLN
10.0000 mg | Freq: Once | INTRAMUSCULAR | Status: AC
  Administered 2020-12-14: 10 mg via INTRAVENOUS
  Filled 2020-12-14: qty 2

## 2020-12-14 MED ORDER — DEXMEDETOMIDINE (PRECEDEX) IN NS 20 MCG/5ML (4 MCG/ML) IV SYRINGE
PREFILLED_SYRINGE | INTRAVENOUS | Status: AC
  Filled 2020-12-14: qty 5

## 2020-12-14 MED ORDER — HYDROMORPHONE HCL 1 MG/ML IJ SOLN
INTRAMUSCULAR | Status: AC
  Filled 2020-12-14: qty 1

## 2020-12-14 MED ORDER — SCOPOLAMINE 1 MG/3DAYS TD PT72
1.0000 | MEDICATED_PATCH | Freq: Once | TRANSDERMAL | Status: DC
  Administered 2020-12-14: 1.5 mg via TRANSDERMAL
  Filled 2020-12-14: qty 1

## 2020-12-14 MED ORDER — HYDROMORPHONE HCL 1 MG/ML IJ SOLN
0.2500 mg | INTRAMUSCULAR | Status: DC | PRN
  Administered 2020-12-14 (×2): 0.5 mg via INTRAVENOUS

## 2020-12-14 MED ORDER — MIDAZOLAM HCL 2 MG/2ML IJ SOLN
INTRAMUSCULAR | Status: DC | PRN
  Administered 2020-12-14: 2 mg via INTRAVENOUS

## 2020-12-14 MED ORDER — LIDOCAINE HCL (CARDIAC) PF 100 MG/5ML IV SOSY
PREFILLED_SYRINGE | INTRAVENOUS | Status: DC | PRN
  Administered 2020-12-14: 80 mg via INTRATRACHEAL

## 2020-12-14 MED ORDER — FENTANYL CITRATE (PF) 250 MCG/5ML IJ SOLN
INTRAMUSCULAR | Status: AC
  Filled 2020-12-14: qty 5

## 2020-12-14 MED ORDER — PROMETHAZINE HCL 25 MG/ML IJ SOLN: 6.2500 mg | INTRAMUSCULAR | Status: DC | PRN

## 2020-12-14 MED ORDER — ROCURONIUM BROMIDE 10 MG/ML (PF) SYRINGE
PREFILLED_SYRINGE | INTRAVENOUS | Status: AC
  Filled 2020-12-14: qty 10

## 2020-12-14 MED ORDER — MIDAZOLAM HCL 2 MG/2ML IJ SOLN
INTRAMUSCULAR | Status: AC
  Filled 2020-12-14: qty 2

## 2020-12-14 MED ORDER — OXYCODONE-ACETAMINOPHEN 7.5-325 MG PO TABS: 1.0000 | ORAL_TABLET | Freq: Four times a day (QID) | ORAL | 0 refills | Status: DC | PRN

## 2020-12-14 MED ORDER — LACTATED RINGERS IV SOLN: INTRAVENOUS | Status: DC

## 2020-12-14 MED ORDER — STERILE WATER FOR IRRIGATION IR SOLN
Status: DC | PRN
  Administered 2020-12-14: 500 mL

## 2020-12-14 MED ORDER — OXYCODONE-ACETAMINOPHEN 7.5-325 MG PO TABS
1.0000 | ORAL_TABLET | ORAL | Status: DC | PRN
  Administered 2020-12-14: 1 via ORAL
  Filled 2020-12-14: qty 1

## 2020-12-14 MED ORDER — SUGAMMADEX SODIUM 500 MG/5ML IV SOLN
INTRAVENOUS | Status: DC | PRN
  Administered 2020-12-14: 200 mg via INTRAVENOUS

## 2020-12-14 MED ORDER — GABAPENTIN 300 MG PO CAPS
300.0000 mg | ORAL_CAPSULE | Freq: Once | ORAL | Status: AC
  Administered 2020-12-14: 300 mg via ORAL
  Filled 2020-12-14: qty 3

## 2020-12-14 MED ORDER — FENTANYL CITRATE (PF) 100 MCG/2ML IJ SOLN
INTRAMUSCULAR | Status: AC
  Filled 2020-12-14: qty 2

## 2020-12-14 MED ORDER — ACETAMINOPHEN 500 MG PO TABS
1000.0000 mg | ORAL_TABLET | Freq: Once | ORAL | Status: AC
  Administered 2020-12-14: 1000 mg via ORAL
  Filled 2020-12-14: qty 2

## 2020-12-14 MED ORDER — DEXAMETHASONE SODIUM PHOSPHATE 10 MG/ML IJ SOLN
INTRAMUSCULAR | Status: DC | PRN
  Administered 2020-12-14: 10 mg via INTRAVENOUS

## 2020-12-14 MED ORDER — LEVOFLOXACIN 500 MG PO TABS: 500.0000 mg | ORAL_TABLET | Freq: Every day | ORAL | 0 refills | Status: DC

## 2020-12-14 MED ORDER — ONDANSETRON HCL 4 MG/2ML IJ SOLN
INTRAMUSCULAR | Status: AC
  Filled 2020-12-14: qty 2

## 2020-12-14 MED ORDER — DEXAMETHASONE SODIUM PHOSPHATE 10 MG/ML IJ SOLN
INTRAMUSCULAR | Status: AC
  Filled 2020-12-14: qty 1

## 2020-12-14 MED ORDER — CHLORHEXIDINE GLUCONATE 0.12 % MT SOLN
15.0000 mL | Freq: Once | OROMUCOSAL | Status: AC
  Administered 2020-12-14: 15 mL via OROMUCOSAL
  Filled 2020-12-14: qty 15

## 2020-12-14 MED ORDER — POVIDONE-IODINE 10 % EX SWAB
2.0000 | Freq: Once | CUTANEOUS | Status: DC
Start: 2020-12-14 — End: 2020-12-14

## 2020-12-14 MED ORDER — SODIUM CHLORIDE 0.9 % IR SOLN
Status: DC | PRN
  Administered 2020-12-14: 3000 mL

## 2020-12-14 MED ORDER — 0.9 % SODIUM CHLORIDE (POUR BTL) OPTIME
TOPICAL | Status: DC | PRN
  Administered 2020-12-14: 1000 mL

## 2020-12-14 MED ORDER — BUPIVACAINE-EPINEPHRINE (PF) 0.5% -1:200000 IJ SOLN
INTRAMUSCULAR | Status: AC
  Filled 2020-12-14: qty 30

## 2020-12-14 MED ORDER — BUPIVACAINE-EPINEPHRINE (PF) 0.5% -1:200000 IJ SOLN
INTRAMUSCULAR | Status: DC | PRN
  Administered 2020-12-14: 20 mL

## 2020-12-14 MED ORDER — ROCURONIUM BROMIDE 10 MG/ML (PF) SYRINGE
PREFILLED_SYRINGE | INTRAVENOUS | Status: DC | PRN
  Administered 2020-12-14: 60 mg via INTRAVENOUS
  Administered 2020-12-14: 20 mg via INTRAVENOUS
  Administered 2020-12-14: 10 mg via INTRAVENOUS

## 2020-12-14 MED ORDER — ONDANSETRON HCL 4 MG/2ML IJ SOLN
INTRAMUSCULAR | Status: DC | PRN
  Administered 2020-12-14: 4 mg via INTRAVENOUS

## 2020-12-14 MED ORDER — CEFAZOLIN SODIUM-DEXTROSE 2-4 GM/100ML-% IV SOLN
INTRAVENOUS | Status: AC
  Filled 2020-12-14: qty 100

## 2020-12-14 MED ORDER — HYDROMORPHONE HCL 1 MG/ML IJ SOLN: 0.2500 mg | INTRAMUSCULAR | Status: DC | PRN

## 2020-12-14 MED ORDER — CEFAZOLIN SODIUM-DEXTROSE 2-4 GM/100ML-% IV SOLN
2.0000 g | INTRAVENOUS | Status: AC
  Administered 2020-12-14: 2 g via INTRAVENOUS
  Filled 2020-12-14: qty 100

## 2020-12-14 MED ORDER — MEPERIDINE HCL 50 MG/ML IJ SOLN: 6.2500 mg | INTRAMUSCULAR | Status: DC | PRN

## 2020-12-14 MED ORDER — PROPOFOL 10 MG/ML IV BOLUS
INTRAVENOUS | Status: DC | PRN
  Administered 2020-12-14: 200 mg via INTRAVENOUS

## 2020-12-14 MED ORDER — PROPOFOL 10 MG/ML IV BOLUS
INTRAVENOUS | Status: AC
  Filled 2020-12-14: qty 40

## 2020-12-14 MED ORDER — LIDOCAINE HCL (PF) 2 % IJ SOLN
INTRAMUSCULAR | Status: AC
  Filled 2020-12-14: qty 5

## 2020-12-14 MED ORDER — FENTANYL CITRATE (PF) 100 MCG/2ML IJ SOLN
50.0000 ug | INTRAMUSCULAR | Status: DC | PRN
  Administered 2020-12-14: 100 ug via INTRAVENOUS

## 2020-12-14 SURGICAL SUPPLY — 35 items
BAG HAMPER (MISCELLANEOUS) ×2 IMPLANT
CLOTH BEACON ORANGE TIMEOUT ST (SAFETY) ×2 IMPLANT
COVER LIGHT HANDLE STERIS (MISCELLANEOUS) ×4 IMPLANT
DECANTER SPIKE VIAL GLASS SM (MISCELLANEOUS) ×2 IMPLANT
DRAPE HALF SHEET 40X57 (DRAPES) ×2 IMPLANT
DRAPE STERI URO 9X17 APER PCH (DRAPES) ×2 IMPLANT
ELECT REM PT RETURN 9FT ADLT (ELECTROSURGICAL) ×2
ELECTRODE REM PT RTRN 9FT ADLT (ELECTROSURGICAL) ×1 IMPLANT
GAUZE 4X4 16PLY ~~LOC~~+RFID DBL (SPONGE) ×4 IMPLANT
GLOVE ECLIPSE 8.0 STRL XLNG CF (GLOVE) ×2 IMPLANT
GLOVE SRG 8 PF TXTR STRL LF DI (GLOVE) ×1 IMPLANT
GLOVE SURG UNDER POLY LF SZ7 (GLOVE) ×6 IMPLANT
GLOVE SURG UNDER POLY LF SZ8 (GLOVE) ×2
GOWN STRL REUS W/TWL LRG LVL3 (GOWN DISPOSABLE) ×4 IMPLANT
GOWN STRL REUS W/TWL XL LVL3 (GOWN DISPOSABLE) ×2 IMPLANT
IV NS IRRIG 3000ML ARTHROMATIC (IV SOLUTION) ×2 IMPLANT
KIT BLADEGUARD II DBL (SET/KITS/TRAYS/PACK) ×2 IMPLANT
KIT TURNOVER CYSTO (KITS) ×2 IMPLANT
MANIFOLD NEPTUNE II (INSTRUMENTS) ×2 IMPLANT
NEEDLE HYPO 21X1.5 SAFETY (NEEDLE) ×2 IMPLANT
NS IRRIG 1000ML POUR BTL (IV SOLUTION) ×2 IMPLANT
PACK PERI GYN (CUSTOM PROCEDURE TRAY) ×2 IMPLANT
PAD ARMBOARD 7.5X6 YLW CONV (MISCELLANEOUS) ×2 IMPLANT
SET BASIN LINEN APH (SET/KITS/TRAYS/PACK) ×2 IMPLANT
SPONGE T-LAP 4X18 ~~LOC~~+RFID (SPONGE) ×2 IMPLANT
SUT MNCRL+ AB 3-0 CT1 36 (SUTURE) ×1 IMPLANT
SUT MON AB 3-0 SH 27 (SUTURE) IMPLANT
SUT MONOCRYL AB 3-0 CT1 36IN (SUTURE) ×2
SUT VIC AB 0 CT1 27 (SUTURE) ×6
SUT VIC AB 0 CT1 27XCR 8 STRN (SUTURE) ×3 IMPLANT
SYR CONTROL 10ML LL (SYRINGE) ×2 IMPLANT
TRAY FOLEY W/BAG SLVR 16FR (SET/KITS/TRAYS/PACK) ×2
TRAY FOLEY W/BAG SLVR 16FR ST (SET/KITS/TRAYS/PACK) ×1 IMPLANT
VERSALIGHT (MISCELLANEOUS) ×2 IMPLANT
WATER STERILE IRR 1000ML POUR (IV SOLUTION) ×2 IMPLANT

## 2020-12-14 NOTE — Op Note (Signed)
Preoperative diagnosis:  1.  dysmenorrhea                                         2.  DUB                                         3.  dyspareunia                                         4.  History of endometriosis  Postoperative diagnosis:  Same as above   Procedure:  Vaginal hysterectomy  Surgeon:  Florian Buff MD  Anesthesia:  General Endotracheal  Findings:  no evidence of endometriosis, normal pelvic anatomy    Description of operation:  The patient was taken from the preoperative area to the operating room in stable condition. She was placed in the sitting position and underwent a GET anesthetic. Once an adequate level of anesthesia was attained she was placed in the dorsal lithotomy position. Patient was prepped and draped in the usual sterile fashion and a Foley catheter was placed.  A weighted speculum was placed and the cervix was grasped with thyroid tenaculums both anteriorly and posteriorly.  0.5% Marcaine with 1/200,000 epinephrine was injected in a circumferential fashion about the cervix and the electrocautery unit was used to incise the vagina and push at all cervix.  The posterior cul-de-sac was then entered sharply without difficulty.  The uterosacral ligaments were clamped cut and inspection suture ligated and held.  The cardinal ligaments were then clamped cut transfixion suture ligated and cut. The anterior peritoneum was identified the anterior cul-de-sac was entered sharply without difficulty. The anterior and posterior leaves of the broad ligament were plicated and the uterine vessels were clamped cut and suture ligated. Serial pedicles were taken of the fundus with each pedicle being clamped cut and suture ligated. The utero-ovarian ligaments were crossclamped the uterus was removed and both pedicles were transfixion suture ligated. There was good hemostasis of all the pedicles. Irrigation x 3 was performed.  The anterior posterior vagina with the peritoneum was closed in  interrupted fashion with good resultant hemostasis.  The sponge needle and instrument counts were correct x 3.  Total blood loss for the procedure was 150 cc.  The patient received 2 g of Ancef preoperatively prophylactically.  She was taken to the recovery room in good stable condition awake alert doing well. Foley was removed in [place] is to discharge from PACU and there were no intraoperative surgical issues that would otherwise change those plans.  Florian Buff 12/14/2020, 9:42 AM

## 2020-12-14 NOTE — Transfer of Care (Signed)
Immediate Anesthesia Transfer of Care Note  Patient: Valerie Huber  Procedure(s) Performed: TOTAL HYSTERECTOMY VAGINAL  Patient Location: PACU  Anesthesia Type:General  Level of Consciousness: awake   Airway & Oxygen Therapy: Patient Spontanous Breathing and Patient connected to nasal cannula oxygen  Post-op Assessment: Report given to RN and Post -op Vital signs reviewed and stable  Post vital signs: Reviewed and stable  Last Vitals:  Vitals Value Taken Time  BP 98/67 12/14/20 0939  Temp    Pulse 85 12/14/20 0941  Resp 14 12/14/20 0941  SpO2 98 % 12/14/20 0941  Vitals shown include unvalidated device data.  Last Pain:  Vitals:   12/14/20 0645  TempSrc: Oral  PainSc: 0-No pain      Patients Stated Pain Goal: 9 ([phone] [id number])  Complications: No notable events documented.

## 2020-12-14 NOTE — Anesthesia Preprocedure Evaluation (Signed)
Anesthesia Evaluation  Patient identified by MRN, date of birth, ID band Patient awake    Reviewed: Allergy & Precautions, NPO status , Patient's Chart, lab work & pertinent test results  History of Anesthesia Complications Negative for: history of anesthetic complications  Airway Mallampati: II  TM Distance: >3 FB Neck ROM: Full    Dental  (+) Dental Advisory Given, Teeth Intact   Pulmonary asthma , former smoker,    Pulmonary exam normal breath sounds clear to auscultation       Cardiovascular Exercise Tolerance: Good Normal cardiovascular exam Rhythm:Regular Rate:Normal     Neuro/Psych Seizures -, Well Controlled,  PSYCHIATRIC DISORDERS Depression  Neuromuscular disease    GI/Hepatic GERD  Medicated and Controlled,(+)     substance abuse  marijuana use,   Endo/Other  negative endocrine ROS  Renal/GU negative Renal ROS     Musculoskeletal negative musculoskeletal ROS (+)   Abdominal   Peds  Hematology negative hematology ROS (+)   Anesthesia Other Findings   Reproductive/Obstetrics negative OB ROS                            Anesthesia Physical Anesthesia Plan  ASA: 2  Anesthesia Plan: General   Post-op Pain Management:    Induction: Intravenous  PONV Risk Score and Plan: 4 or greater and Ondansetron, Dexamethasone, Midazolam and Scopolamine patch - Pre-op  Airway Management Planned: Oral ETT  Additional Equipment:   Intra-op Plan:   Post-operative Plan: Extubation in OR  Informed Consent: I have reviewed the patients History and Physical, chart, labs and discussed the procedure including the risks, benefits and alternatives for the proposed anesthesia with the patient or authorized representative who has indicated his/her understanding and acceptance.     Dental advisory given  Plan Discussed with: CRNA and Surgeon  Anesthesia Plan Comments:          Anesthesia Quick Evaluation

## 2020-12-14 NOTE — H&P (Signed)
Preoperative History and Physical  Valerie Huber is a 35 y.o. [id number] with Patient's last menstrual period was 12/08/2020. admitted for a TVH with bilateral salpingectomy(if possible).   Dysmenorrhea: Today she notes that the pain continues to be a chronic issue.  She has started the [place], but has not yet seen improvement. Previously reported long-standing h/o endometriosis- diagnosed in 2005.  Op note obtained and reviewed- Stage 2 endometriosis. In review, she notes that periods are sometimes 2x per month with significant dysmenorrhea.  Taking pain meds (tylenol) with no improvement.  Pain is 10/10, worse than labor pain.  Stopped having sex due to discomfort. Also notes pelvic pain outside of her menses- notes pain more than 1/2 of the month. Menses typically 4-5 days- denies HMB, ablation did work to improve the bleeding.     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   Endometriosis    Irregular periods 12/31/2014   Pelvic pain in female 12/31/2014   Pregnant 04/14/2015   Second degree uterine prolapse 12/31/2014   Seizures ([place])    Vaginal Pap smear, abnormal     PSH:     Past Surgical History:  Procedure Laterality Date   BIOPSY  07/06/2019   Procedure: BIOPSY;  Surgeon: Daneil Dolin, MD;  [facility];  Service: Endoscopy;;  colon    COLONOSCOPY WITH PROPOFOL N/A 07/06/2019   one 3 mm polyp hyperplastic, segmental biopsies negative   DILITATION & CURRETTAGE/HYSTROSCOPY WITH NOVASURE ABLATION N/A 10/08/2017   Procedure: DILATATION & CURETTAGE/HYSTEROSCOPY WITH MINERVA  ENDOMETRIAL ABLATION (Procedure #2);  Surgeon: [name], MD;  Location: [facility];  Service: Gynecology;  Laterality: N/A;   ESOPHAGOGASTRODUODENOSCOPY N/A 12/10/2014   SLF: hematoemesis due to moderate erosive gastritis, duoedentitis, and duodenal ulcer   LEEP     POLYPECTOMY  07/06/2019   Procedure: POLYPECTOMY;  Surgeon: Daneil Dolin, MD;   [facility];  Service: Endoscopy;;   surgery for endometriosis  2005   TUBAL LIGATION Bilateral 10/08/2017   Procedure: BILATERAL TUBAL STERILIZATION WITH FALLOPE RINGS APPLICATION (Procedure #1);  Surgeon: [name], MD;  Location: [facility];  Service: Gynecology;  Laterality: Bilateral;    POb/GynH:      OB History     Gravida  3   Para  2   Term  2   Preterm      AB  1   Living  2      SAB      IAB  1   Ectopic      Multiple  0   Live Births  2           SH:   Social History   Tobacco Use   Smoking status: Former    Packs/day: 0.25    Years: 14.00    Pack years: 3.50    Types: Cigarettes   Smokeless tobacco: Never   Tobacco comments:    quit in 2017   Vaping Use   Vaping Use: Never used  Substance Use Topics   Alcohol use: Yes    Alcohol/week: 0.0 standard drinks    Comment: occ   Drug use: Yes    Types: Marijuana    FH:    Family History  Problem Relation Age of Onset   Cancer Mother        kidney    Asthma Mother    Hypertension Mother  Colon polyps Sister        10, large polyp, done at [place] Paternal Grandmother    Asthma Son    Colon cancer Neg Hx      Allergies:  Allergies  Allergen Reactions   Flexeril [Cyclobenzaprine] Hives   Ibuprofen Anaphylaxis    Throat, eyes, lips swollen    Medications:       Current Facility-Administered Medications:    ceFAZolin (ANCEF) 2-4 GM/100ML-% IVPB, , , ,    ceFAZolin (ANCEF) IVPB 2g/100 mL premix, 2 g, Intravenous, On Call to OR, Florian Buff, MD   lactated ringers infusion, , Intravenous, Continuous, Battula, Rajamani C, MD, Last Rate: 50 mL/hr at 12/14/20 0712, New Bag at 12/14/20 0712   povidone-iodine 10 % swab 2 application, 2 application, Topical, Once, Florian Buff, MD   scopolamine (TRANSDERM-SCOP) 1 MG/3DAYS 1.5 mg, 1 patch, Transdermal, Once, Battula, Rajamani C, MD, 1.5 mg at 12/14/20 [id number]  Review of Systems:   Review of Systems   Constitutional: Negative for fever, chills, weight loss, malaise/fatigue and diaphoresis.  HENT: Negative for hearing loss, ear pain, nosebleeds, congestion, sore throat, neck pain, tinnitus and ear discharge.   Eyes: Negative for blurred vision, double vision, photophobia, pain, discharge and redness.  Respiratory: Negative for cough, hemoptysis, sputum production, shortness of breath, wheezing and stridor.   Cardiovascular: Negative for chest pain, palpitations, orthopnea, claudication, leg swelling and PND.  Gastrointestinal: Positive for abdominal pain. Negative for heartburn, nausea, vomiting, diarrhea, constipation, blood in stool and melena.  Genitourinary: Negative for dysuria, urgency, frequency, hematuria and flank pain.  Musculoskeletal: Negative for myalgias, back pain, joint pain and falls.  Skin: Negative for itching and rash.  Neurological: Negative for dizziness, tingling, tremors, sensory change, speech change, focal weakness, seizures, loss of consciousness, weakness and headaches.  Endo/Heme/Allergies: Negative for environmental allergies and polydipsia. Does not bruise/bleed easily.  Psychiatric/Behavioral: Negative for depression, suicidal ideas, hallucinations, memory loss and substance abuse. The patient is not nervous/anxious and does not have insomnia.      PHYSICAL EXAM:  Blood pressure 110/75, pulse 71, temperature 98.6 F (37 C), temperature source Oral, resp. rate (!) 23, height '5\' 4"'$  (1.626 m), weight 72.6 kg, last menstrual period 12/08/2020, SpO2 99 %.    Vitals reviewed. Constitutional: She is oriented to person, place, and time. She appears well-developed and well-nourished.  HENT:  Head: Normocephalic and atraumatic.  Right Ear: External ear normal.  Left Ear: External ear normal.  Nose: Nose normal.  Mouth/Throat: Oropharynx is clear and moist.  Eyes: Conjunctivae and EOM are normal. Pupils are equal, round, and reactive to light. Right eye exhibits  no discharge. Left eye exhibits no discharge. No scleral icterus.  Neck: Normal range of motion. Neck supple. No tracheal deviation present. No thyromegaly present.  Cardiovascular: Normal rate, regular rhythm, normal heart sounds and intact distal pulses.  Exam reveals no gallop and no friction rub.   No murmur heard. Respiratory: Effort normal and breath sounds normal. No respiratory distress. She has no wheezes. She has no rales. She exhibits no tenderness.  GI: Soft. Bowel sounds are normal. She exhibits no distension and no mass. There is tenderness. There is no rebound and no guarding.  Genitourinary:       Vulva is normal without lesions Vagina is pink moist without discharge Cervix normal in appearance and pap is normal Uterus is normal size, contour, position, consistency, mobility, non-tender Adnexa is negative with normal sized ovaries by sonogram  Musculoskeletal: Normal range of motion. She exhibits no edema and no tenderness.  Neurological: She is alert and oriented to person, place, and time. She has normal reflexes. She displays normal reflexes. No cranial nerve deficit. She exhibits normal muscle tone. Coordination normal.  Skin: Skin is warm and dry. No rash noted. No erythema. No pallor.  Psychiatric: She has a normal mood and affect. Her behavior is normal. Judgment and thought content normal.    Labs: Results for orders placed or performed during the hospital encounter of 12/09/20 (from the past 336 hour(s))  CBC   Collection Time: 12/09/20 12:42 PM  Result Value Ref Range   WBC 4.8 4.0 - 10.5 K/uL   RBC 3.77 (L) 3.87 - 5.11 MIL/uL   Hemoglobin 11.6 (L) 12.0 - 15.0 g/dL   HCT 36.0 36.0 - 46.0 %   MCV 95.5 80.0 - 100.0 fL   MCH 30.8 26.0 - 34.0 pg   MCHC 32.2 30.0 - 36.0 g/dL   RDW 11.9 11.5 - 15.5 %   Platelets 241 150 - 400 K/uL   nRBC 0.0 0.0 - 0.2 %  Comprehensive metabolic panel   Collection Time: 12/09/20 12:42 PM  Result Value Ref Range   Sodium 139 135  - 145 mmol/L   Potassium 3.6 3.5 - 5.1 mmol/L   Chloride 108 98 - 111 mmol/L   CO2 26 22 - 32 mmol/L   Glucose, Bld 109 (H) 70 - 99 mg/dL   BUN 12 6 - 20 mg/dL   Creatinine, Ser 0.59 0.44 - 1.00 mg/dL   Calcium 8.9 8.9 - 10.3 mg/dL   Total Protein 6.6 6.5 - 8.1 g/dL   Albumin 3.8 3.5 - 5.0 g/dL   AST 15 15 - 41 U/L   ALT 17 0 - 44 U/L   Alkaline Phosphatase 54 38 - 126 U/L   Total Bilirubin 0.3 0.3 - 1.2 mg/dL   GFR, Estimated >60 >60 mL/min   Anion gap 5 5 - 15  hCG, quantitative, pregnancy   Collection Time: 12/09/20 12:42 PM  Result Value Ref Range   hCG, Beta Chain, Quant, S <1 <5 mIU/mL  Rapid HIV screen (HIV 1/2 Ab+Ag)   Collection Time: 12/09/20 12:42 PM  Result Value Ref Range   HIV-1 P24 Antigen - HIV24 NON REACTIVE NON REACTIVE   HIV 1/2 Antibodies NON REACTIVE NON REACTIVE   Interpretation (HIV Ag Ab)      A non reactive test result means that HIV 1 or HIV 2 antibodies and HIV 1 p24 antigen were not detected in the specimen.  Urinalysis, Routine w reflex microscopic Urine, Clean Catch   Collection Time: 12/09/20 12:42 PM  Result Value Ref Range   Color, Urine YELLOW YELLOW   APPearance HAZY (A) CLEAR   Specific Gravity, Urine 1.041 (H) 1.005 - 1.030   pH 5.0 5.0 - 8.0   Glucose, UA NEGATIVE NEGATIVE mg/dL   Hgb urine dipstick SMALL (A) NEGATIVE   Bilirubin Urine SMALL (A) NEGATIVE   Ketones, ur 5 (A) NEGATIVE mg/dL   Protein, ur 30 (A) NEGATIVE mg/dL   Nitrite NEGATIVE NEGATIVE   Leukocytes,Ua NEGATIVE NEGATIVE   RBC / HPF 6-10 0 - 5 RBC/hpf   WBC, UA 0-5 0 - 5 WBC/hpf   Bacteria, UA RARE (A) NONE SEEN   Squamous Epithelial / LPF 6-10 0 - 5   Mucus PRESENT   Type and screen   Collection Time: 12/09/20 12:42 PM  Result Value Ref Range  ABO/RH(D) A POS    Antibody Screen NEG    Sample Expiration 12/23/2020,2359    Extend sample reason      NO TRANSFUSIONS OR PREGNANCY IN THE PAST 3 MONTHS Performed at [facility], [address], [place], [place]  [address]   Results for orders placed or performed during the hospital encounter of 11/30/20 (from the past 336 hour(s))  CBC with Differential/Platelet   Collection Time: 11/30/20  7:32 PM  Result Value Ref Range   WBC 5.0 4.0 - 10.5 K/uL   RBC 3.88 3.87 - 5.11 MIL/uL   Hemoglobin 12.0 12.0 - 15.0 g/dL   HCT 37.5 36.0 - 46.0 %   MCV 96.6 80.0 - 100.0 fL   MCH 30.9 26.0 - 34.0 pg   MCHC 32.0 30.0 - 36.0 g/dL   RDW 12.2 11.5 - 15.5 %   Platelets 240 150 - 400 K/uL   nRBC 0.0 0.0 - 0.2 %   Neutrophils Relative % 27 %   Neutro Abs 1.4 (L) 1.7 - 7.7 K/uL   Lymphocytes Relative 64 %   Lymphs Abs 3.2 0.7 - 4.0 K/uL   Monocytes Relative 8 %   Monocytes Absolute 0.4 0.1 - 1.0 K/uL   Eosinophils Relative 1 %   Eosinophils Absolute 0.1 0.0 - 0.5 K/uL   Basophils Relative 0 %   Basophils Absolute 0.0 0.0 - 0.1 K/uL   Immature Granulocytes 0 %   Abs Immature Granulocytes 0.00 0.00 - 0.07 K/uL  Basic metabolic panel   Collection Time: 11/30/20  7:32 PM  Result Value Ref Range   Sodium 137 135 - 145 mmol/L   Potassium 4.1 3.5 - 5.1 mmol/L   Chloride 105 98 - 111 mmol/L   CO2 25 22 - 32 mmol/L   Glucose, Bld 82 70 - 99 mg/dL   BUN 9 6 - 20 mg/dL   Creatinine, Ser 0.74 0.44 - 1.00 mg/dL   Calcium 9.2 8.9 - 10.3 mg/dL   GFR, Estimated >60 >60 mL/min   Anion gap 7 5 - 15    EKG: Orders placed or performed during the hospital encounter of 10/30/20   ED EKG   ED EKG   EKG   EKG 12-Lead   EKG 12-Lead    Imaging Studies: No results found.    Assessment: Dysmenorrhea Dyspareunia DUB    Plan: TVH with bilateral salpingectomy  Florian Buff 12/14/2020 7:16 AM

## 2020-12-14 NOTE — Anesthesia Procedure Notes (Signed)
Procedure Name: Intubation Date/Time: 12/14/2020 7:34 AM Performed by: Karna Dupes, CRNA Pre-anesthesia Checklist: Patient identified, Emergency Drugs available, Suction available and Patient being monitored Patient Re-evaluated:Patient Re-evaluated prior to induction Oxygen Delivery Method: Circle system utilized Preoxygenation: Pre-oxygenation with 100% oxygen Induction Type: IV induction Ventilation: Mask ventilation without difficulty Laryngoscope Size: Mac and 3 Grade View: Grade I Tube type: Oral Tube size: 7.0 mm Number of attempts: 1 Airway Equipment and Method: Stylet Placement Confirmation: ETT inserted through vocal cords under direct vision, positive ETCO2 and breath sounds checked- equal and bilateral Secured at: 21 cm Tube secured with: Tape Dental Injury: Teeth and Oropharynx as per pre-operative assessment

## 2020-12-14 NOTE — Anesthesia Postprocedure Evaluation (Signed)
Anesthesia Post Note  Patient: Valerie Huber  Procedure(s) Performed: TOTAL HYSTERECTOMY VAGINAL  Patient location during evaluation: PACU Anesthesia Type: General Level of consciousness: awake and alert and oriented Pain management: pain level controlled Vital Signs Assessment: post-procedure vital signs reviewed and stable Respiratory status: spontaneous breathing and respiratory function stable Cardiovascular status: blood pressure returned to baseline and stable Postop Assessment: no apparent nausea or vomiting Anesthetic complications: no   No notable events documented.   Last Vitals:  Vitals:   12/14/20 1100 12/14/20 1130  BP: 105/86 92/63  Pulse: 84 80  Resp: 13 17  Temp:  (!) 36.2 C  SpO2: 97%     Last Pain:  Vitals:   12/14/20 1130  TempSrc: Axillary  PainSc: 7                  [name] C Jae Bruck

## 2020-12-15 ENCOUNTER — Encounter (HOSPITAL_COMMUNITY): Payer: Self-pay | Admitting: Obstetrics & Gynecology

## 2020-12-16 LAB — SURGICAL PATHOLOGY

## 2020-12-22 ENCOUNTER — Encounter: Payer: Self-pay | Admitting: Obstetrics & Gynecology

## 2020-12-22 ENCOUNTER — Ambulatory Visit (INDEPENDENT_AMBULATORY_CARE_PROVIDER_SITE_OTHER): Payer: Medicaid Other | Admitting: Obstetrics & Gynecology

## 2020-12-22 ENCOUNTER — Other Ambulatory Visit: Payer: Self-pay

## 2020-12-22 VITALS — BP 110/75 | HR 76 | Ht 64.0 in | Wt 168.0 lb

## 2020-12-22 DIAGNOSIS — Z9889 Other specified postprocedural states: Secondary | ICD-10-CM

## 2020-12-22 NOTE — Progress Notes (Signed)
  HPI: Patient returns for routine postoperative follow-up having undergone TVH on 12/14/20.  The patient's immediate postoperative recovery has been unremarkable. Since hospital discharge the patient reports no problems occasional dark spotting when wipes.   Current Outpatient Medications: acetaminophen (TYLENOL) 500 MG tablet, Take 500-1,500 mg by mouth every 6 (six) hours as needed for moderate pain., Disp: , Rfl:  alum & mag hydroxide-simeth (MYLANTA) [id number] MG/5ML suspension, Take 30 mLs by mouth every 6 (six) hours as needed for indigestion or heartburn., Disp: 355 mL, Rfl: 0 levocetirizine (XYZAL) 5 MG tablet, TAKE 1 TABLET(5 MG) BY MOUTH EVERY EVENING (Patient taking differently: Take 5 mg by mouth every evening.), Disp: 30 tablet, Rfl: 2 levofloxacin (LEVAQUIN) 500 MG tablet, Take 1 tablet (500 mg total) by mouth daily., Disp: 7 tablet, Rfl: 0 ondansetron (ZOFRAN ODT) 8 MG disintegrating tablet, Take 1 tablet (8 mg total) by mouth every 8 (eight) hours as needed for nausea or vomiting., Disp: 8 tablet, Rfl: 0 oxyCODONE-acetaminophen (PERCOCET) 7.5-325 MG tablet, Take 1-2 tablets by mouth every 6 (six) hours as needed., Disp: 30 tablet, Rfl: 0 pantoprazole (PROTONIX) 40 MG tablet, Take 1 tablet (40 mg total) by mouth daily., Disp: 30 tablet, Rfl: 3  No current facility-administered medications for this visit.    Blood pressure 110/75, pulse 76, height '5\' 4"'$  (1.626 m), weight 168 lb (76.2 kg), last menstrual period 12/08/2020.  Physical Exam: Normal post op abdominal exam  Diagnostic Tests:   Pathology: benign  Impression:   ICD-10-CM   1. S/P [facility] 12/14/20  Z98.890         Plan: No orders of the defined types were placed in this encounter.    Follow up: No follow-ups on file.   Florian Buff, MD

## 2020-12-29 ENCOUNTER — Encounter: Payer: Self-pay | Admitting: Obstetrics & Gynecology

## 2021-01-20 ENCOUNTER — Encounter: Payer: Medicaid Other | Admitting: Obstetrics & Gynecology

## 2021-02-06 ENCOUNTER — Ambulatory Visit: Payer: Medicaid Other | Admitting: Cardiovascular Disease

## 2021-02-13 ENCOUNTER — Encounter: Payer: Medicaid Other | Admitting: Obstetrics & Gynecology

## 2021-03-03 ENCOUNTER — Ambulatory Visit: Payer: Medicaid Other | Admitting: Internal Medicine

## 2021-03-08 ENCOUNTER — Ambulatory Visit: Payer: Medicaid Other | Admitting: Cardiology

## 2021-03-13 ENCOUNTER — Ambulatory Visit: Payer: Medicaid Other | Admitting: Internal Medicine

## 2021-03-31 ENCOUNTER — Ambulatory Visit
Admission: EM | Admit: 2021-03-31 | Discharge: 2021-03-31 | Disposition: A | Discharge status: Home / Self Care | Payer: Medicaid Other

## 2021-03-31 ENCOUNTER — Other Ambulatory Visit: Payer: Self-pay

## 2021-03-31 DIAGNOSIS — J039 Acute tonsillitis, unspecified: Secondary | ICD-10-CM | POA: Diagnosis not present

## 2021-03-31 MED ORDER — LIDOCAINE VISCOUS HCL 2 % MT SOLN: 10.0000 mL | OROMUCOSAL | 0 refills | Status: DC | PRN

## 2021-03-31 MED ORDER — DEXAMETHASONE SODIUM PHOSPHATE 10 MG/ML IJ SOLN
10.0000 mg | Freq: Once | INTRAMUSCULAR | Status: AC
  Administered 2021-03-31: 10 mg via INTRAMUSCULAR

## 2021-03-31 MED ORDER — AMOXICILLIN 875 MG PO TABS: 875.0000 mg | ORAL_TABLET | Freq: Two times a day (BID) | ORAL | 0 refills | Status: DC

## 2021-03-31 NOTE — ED Provider Notes (Signed)
[facility]    CSN: [id number] Arrival date & time: 03/31/21  1351      History   Chief Complaint Chief Complaint  Patient presents with   Cough   Sore Throat    HPI Valerie Huber is a 35 y.o. female.   Patient presenting today with over a week of severe sore, swollen throat that has worsened to the point where she can barely swallow at this time.  She states she feels like she has had low-grade fevers and cough due to swollen throat.  Denies chest pain, shortness of breath, abdominal pain, nausea vomiting or diarrhea.  Has taken Mucinex, Benadryl, DayQuil, NyQuil, Motrin, Tylenol with no relief.   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   Endometriosis    Irregular periods 12/31/2014   Pelvic pain in female 12/31/2014   Pregnant 04/14/2015   Second degree uterine prolapse 12/31/2014   Seizures ([place])    Vaginal Pap smear, abnormal     Patient Active Problem List   Diagnosis Date Noted   DUB (dysfunctional uterine bleeding)    Dysmenorrhea    Dyspareunia, female    History of endometriosis    Chest pain 11/07/2020   Seasonal allergic rhinitis due to pollen 09/01/2020   Encounter for examination following treatment at hospital 04/12/2020   Heme positive stool 06/30/2019   Chronic diarrhea 06/30/2019   Rectal bleeding    Complex regional pain syndrome type 1 of right lower extremity 05/20/2017   Depression 11/30/2015   H/O: substance abuse ([place]) 11/30/2015   History of seizures 11/30/2015   Marijuana use 07/05/2015   Moderate dysplasia of cervix (CIN II)  at endocervical margin of LEEP specimen 01/22/2015   Pelvic pain in female 12/31/2014   Endometriosis determined by laparoscopy 12/31/2014   Second degree uterine prolapse 12/31/2014   Irregular periods 12/31/2014   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/21/2014   Hematemesis with nausea    Anaphylaxis 09/04/2011    Past Surgical History:  Procedure  Laterality Date   BIOPSY  07/06/2019   Procedure: BIOPSY;  Surgeon: Daneil Dolin, MD;  [facility];  Service: Endoscopy;;  colon    COLONOSCOPY WITH PROPOFOL N/A 07/06/2019   one 3 mm polyp hyperplastic, segmental biopsies negative   DILITATION & CURRETTAGE/HYSTROSCOPY WITH NOVASURE ABLATION N/A 10/08/2017   Procedure: DILATATION & CURETTAGE/HYSTEROSCOPY WITH MINERVA  ENDOMETRIAL ABLATION (Procedure #2);  Surgeon: [name], MD;  Location: [facility];  Service: Gynecology;  Laterality: N/A;   ESOPHAGOGASTRODUODENOSCOPY N/A 12/10/2014   SLF: hematoemesis due to moderate erosive gastritis, duoedentitis, and duodenal ulcer   LEEP     POLYPECTOMY  07/06/2019   Procedure: POLYPECTOMY;  Surgeon: Daneil Dolin, MD;  [facility];  Service: Endoscopy;;   surgery for endometriosis  2005   TUBAL LIGATION Bilateral 10/08/2017   Procedure: BILATERAL TUBAL STERILIZATION WITH FALLOPE RINGS APPLICATION (Procedure #1);  Surgeon: [name], MD;  Location: [facility];  Service: Gynecology;  Laterality: Bilateral;   VAGINAL HYSTERECTOMY N/A 12/14/2020   Procedure: TOTAL HYSTERECTOMY VAGINAL;  Surgeon: Florian Buff, MD;  Location: [facility];  Service: Gynecology;  Laterality: N/A;    OB History     Gravida  3   Para  2   Term  2   Preterm      AB  1   Living  2      SAB  IAB  1   Ectopic      Multiple  0   Live Births  2            Home Medications    Prior to Admission medications   Medication Sig Start Date End Date Taking? Authorizing Provider  amoxicillin (AMOXIL) 875 MG tablet Take 1 tablet (875 mg total) by mouth 2 (two) times daily. 03/31/21  Yes Volney American, PA-C  diphenhydrAMINE (BENADRYL ALLERGY) 25 mg capsule Take 25 mg by mouth every 6 (six) hours as needed.   Yes [provider]  DM-Doxylamine-Acetaminophen (NYQUIL COLD & FLU PO) Take by mouth.   Yes [provider]  lidocaine (XYLOCAINE) 2 % solution Use as  directed 10 mLs in the mouth or throat every 3 (three) hours as needed for mouth pain. 03/31/21  Yes Volney American, PA-C  Phenylephrine-DM-GG-[facility] FAST-MAX COLD/FLU PO) Take by mouth.   Yes [provider]  acetaminophen (TYLENOL) 500 MG tablet Take 500-1,500 mg by mouth every 6 (six) hours as needed for moderate pain.    [provider]  alum & mag hydroxide-simeth (MYLANTA) 200-200-20 MG/5ML suspension Take 30 mLs by mouth every 6 (six) hours as needed for indigestion or heartburn. Patient not taking: Reported on 03/31/2021 11/07/20   Noreene Larsson, NP  levocetirizine (XYZAL) 5 MG tablet TAKE 1 TABLET(5 MG) BY MOUTH EVERY EVENING Patient not taking: Reported on 03/31/2021 11/28/20   Noreene Larsson, NP  levofloxacin (LEVAQUIN) 500 MG tablet Take 1 tablet (500 mg total) by mouth daily. 12/14/20   Florian Buff, MD  ondansetron (ZOFRAN ODT) 8 MG disintegrating tablet Take 1 tablet (8 mg total) by mouth every 8 (eight) hours as needed for nausea or vomiting. Patient not taking: Reported on 03/31/2021 12/14/20   Florian Buff, MD  oxyCODONE-acetaminophen (PERCOCET) 7.5-325 MG tablet Take 1-2 tablets by mouth every 6 (six) hours as needed. Patient not taking: Reported on 03/31/2021 12/14/20   Florian Buff, MD  pantoprazole (PROTONIX) 40 MG tablet Take 1 tablet (40 mg total) by mouth daily. Patient not taking: Reported on 03/31/2021 09/01/20   Lindell Spar, MD    Family History Family History  Problem Relation Age of Onset   Cancer Mother        kidney    Asthma Mother    Hypertension Mother    Colon polyps Sister        30, large polyp, done at 22    Cancer Paternal Grandmother    Asthma Son    Colon cancer Neg Hx     Social History Social History   Tobacco Use   Smoking status: Former    Packs/day: 0.25    Years: 14.00    Pack years: 3.50    Types: Cigarettes   Smokeless tobacco: Never   Tobacco comments:    quit in 2017   Vaping Use    Vaping Use: Never used  Substance Use Topics   Alcohol use: Yes    Alcohol/week: 0.0 standard drinks    Comment: occ   Drug use: Yes    Types: Marijuana     Allergies   Flexeril [cyclobenzaprine] and Ibuprofen   Review of Systems Review of Systems Per HPI  Physical Exam Triage Vital Signs ED Triage Vitals [03/31/21 1357]  Enc Vitals Group     BP 121/85     Pulse Rate 86     Resp 12     Temp  99.2 F (37.3 C)     Temp Source Oral     SpO2 98 %     Weight      Height      Head Circumference      Peak Flow      Pain Score 10     Pain Loc      Pain Edu?      Excl. in [place]?    No data found.  Updated Vital Signs BP 121/85 (BP Location: Right Arm)   Pulse 86   Temp 99.2 F (37.3 C) (Oral)   Resp 12   LMP 12/08/2020   SpO2 98%   Visual Acuity Right Eye Distance:   Left Eye Distance:   Bilateral Distance:    Right Eye Near:   Left Eye Near:    Bilateral Near:     Physical Exam Vitals and nursing note reviewed.  Constitutional:      Appearance: Normal appearance. She is not ill-appearing.  HENT:     Head: Atraumatic.     Nose: Rhinorrhea present.     Mouth/Throat:     Mouth: Mucous membranes are moist.     Pharynx: Oropharyngeal exudate and posterior oropharyngeal erythema present.     Comments: Moderate tonsillar erythema, edema, exudates bilaterally.  Uvula midline, no obvious peritonsillar abscess noted Eyes:     Extraocular Movements: Extraocular movements intact.     Conjunctiva/sclera: Conjunctivae normal.  Cardiovascular:     Rate and Rhythm: Normal rate and regular rhythm.     Heart sounds: Normal heart sounds.  Pulmonary:     Effort: Pulmonary effort is normal.     Breath sounds: Normal breath sounds. No wheezing or rales.  Musculoskeletal:        General: Normal range of motion.     Cervical back: Normal range of motion and neck supple.  Skin:    General: Skin is warm and dry.  Neurological:     Mental Status: She is alert and oriented  to person, place, and time.  Psychiatric:        Mood and Affect: Mood normal.        Thought Content: Thought content normal.        Judgment: Judgment normal.     UC Treatments / Results  Labs (all labs ordered are listed, but only abnormal results are displayed) Labs Reviewed - No data to display  EKG   Radiology No results found.  Procedures Procedures (including critical care time)  Medications Ordered in UC Medications  dexamethasone (DECADRON) injection 10 mg (10 mg Intramuscular Given 03/31/21 1653)    Initial Impression / Assessment and Plan / UC Course  I have reviewed the triage vital signs and the nursing notes.  Pertinent labs & imaging results that were available during my care of the patient were reviewed by me and considered in my medical decision making (see chart for details).     Will treat with IM Decadron for the tonsillar edema, amoxicillin for suspected bacterial tonsillitis, viscous lidocaine, and continued over-the-counter supportive medications and home care.  Return for acutely worsening symptoms.  Final Clinical Impressions(s) / UC Diagnoses   Final diagnoses:  Acute tonsillitis, unspecified etiology   Discharge Instructions   None    ED Prescriptions     Medication Sig Dispense Auth. Provider   amoxicillin (AMOXIL) 875 MG tablet Take 1 tablet (875 mg total) by mouth 2 (two) times daily. 20 tablet Volney American, [place]   lidocaine (  XYLOCAINE) 2 % solution Use as directed 10 mLs in the mouth or throat every 3 (three) hours as needed for mouth pain. 100 mL Volney American, [place]      PDMP not reviewed this encounter.   Volney American, [place] 03/31/21 1714

## 2021-03-31 NOTE — ED Triage Notes (Signed)
Pt presents with cough and sore throat

## 2021-04-02 ENCOUNTER — Emergency Department (HOSPITAL_COMMUNITY): Payer: Medicaid Other

## 2021-04-02 ENCOUNTER — Encounter (HOSPITAL_COMMUNITY): Payer: Self-pay

## 2021-04-02 ENCOUNTER — Other Ambulatory Visit: Payer: Self-pay

## 2021-04-02 ENCOUNTER — Emergency Department (HOSPITAL_COMMUNITY)
Admission: EM | Admit: 2021-04-02 | Discharge: 2021-04-02 | Disposition: A | Discharge status: Home / Self Care | Payer: Medicaid Other | Attending: Emergency Medicine | Admitting: Emergency Medicine

## 2021-04-02 DIAGNOSIS — J039 Acute tonsillitis, unspecified: Secondary | ICD-10-CM | POA: Diagnosis not present

## 2021-04-02 DIAGNOSIS — R0602 Shortness of breath: Secondary | ICD-10-CM | POA: Diagnosis not present

## 2021-04-02 DIAGNOSIS — Z20822 Contact with and (suspected) exposure to covid-19: Secondary | ICD-10-CM | POA: Insufficient documentation

## 2021-04-02 DIAGNOSIS — Z87891 Personal history of nicotine dependence: Secondary | ICD-10-CM | POA: Diagnosis not present

## 2021-04-02 DIAGNOSIS — M791 Myalgia, unspecified site: Secondary | ICD-10-CM | POA: Insufficient documentation

## 2021-04-02 DIAGNOSIS — J45909 Unspecified asthma, uncomplicated: Secondary | ICD-10-CM | POA: Diagnosis not present

## 2021-04-02 DIAGNOSIS — E010 Iodine-deficiency related diffuse (endemic) goiter: Secondary | ICD-10-CM | POA: Diagnosis not present

## 2021-04-02 DIAGNOSIS — J029 Acute pharyngitis, unspecified: Secondary | ICD-10-CM | POA: Diagnosis not present

## 2021-04-02 DIAGNOSIS — J353 Hypertrophy of tonsils with hypertrophy of adenoids: Secondary | ICD-10-CM | POA: Diagnosis not present

## 2021-04-02 DIAGNOSIS — R059 Cough, unspecified: Secondary | ICD-10-CM | POA: Diagnosis not present

## 2021-04-02 DIAGNOSIS — R590 Localized enlarged lymph nodes: Secondary | ICD-10-CM | POA: Diagnosis not present

## 2021-04-02 LAB — CBC WITH DIFFERENTIAL/PLATELET
Abs Immature Granulocytes: 0.01 10*3/uL (ref 0.00–0.07)
Basophils Absolute: 0 10*3/uL (ref 0.0–0.1)
Basophils Relative: 0 %
Eosinophils Absolute: 0.1 10*3/uL (ref 0.0–0.5)
Eosinophils Relative: 1 %
HCT: 37.9 % (ref 36.0–46.0)
Hemoglobin: 12.4 g/dL (ref 12.0–15.0)
Immature Granulocytes: 0 %
Lymphocytes Relative: 50 %
Lymphs Abs: 3 10*3/uL (ref 0.7–4.0)
MCH: 31.4 pg (ref 26.0–34.0)
MCHC: 32.7 g/dL (ref 30.0–36.0)
MCV: 95.9 fL (ref 80.0–100.0)
Monocytes Absolute: 0.6 10*3/uL (ref 0.1–1.0)
Monocytes Relative: 10 %
Neutro Abs: 2.4 10*3/uL (ref 1.7–7.7)
Neutrophils Relative %: 39 %
Platelets: 267 10*3/uL (ref 150–400)
RBC: 3.95 MIL/uL (ref 3.87–5.11)
RDW: 12.9 % (ref 11.5–15.5)
WBC: 6 10*3/uL (ref 4.0–10.5)
nRBC: 0 % (ref 0.0–0.2)

## 2021-04-02 LAB — BASIC METABOLIC PANEL
Anion gap: 9 (ref 5–15)
BUN: 16 mg/dL (ref 6–20)
CO2: 24 mmol/L (ref 22–32)
Calcium: 9.1 mg/dL (ref 8.9–10.3)
Chloride: 105 mmol/L (ref 98–111)
Creatinine, Ser: 0.84 mg/dL (ref 0.44–1.00)
GFR, Estimated: >60 mL/min (ref >60)
Glucose, Bld: 102 mg/dL — ABNORMAL HIGH (ref 70–99)
Potassium: 3.8 mmol/L (ref 3.5–5.1)
Sodium: 138 mmol/L (ref 135–145)

## 2021-04-02 LAB — GROUP A STREP BY PCR: Group A Strep by PCR: NOT DETECTED

## 2021-04-02 LAB — RESP PANEL BY RT-PCR (FLU A&B, COVID) ARPGX2
Influenza A by PCR: NEGATIVE
Influenza B by PCR: NEGATIVE
SARS Coronavirus 2 by RT PCR: NEGATIVE

## 2021-04-02 MED ORDER — ONDANSETRON 8 MG PO TBDP
8.0000 mg | ORAL_TABLET | Freq: Once | ORAL | Status: AC
Start: 2021-04-02 — End: 2021-04-02
  Administered 2021-04-02: 8 mg via ORAL
  Filled 2021-04-02: qty 1

## 2021-04-02 MED ORDER — MORPHINE SULFATE (PF) 4 MG/ML IV SOLN
4.0000 mg | Freq: Once | INTRAVENOUS | Status: AC
  Administered 2021-04-02: 4 mg via INTRAMUSCULAR
  Filled 2021-04-02: qty 1

## 2021-04-02 MED ORDER — AZITHROMYCIN 250 MG PO TABS: 250.0000 mg | ORAL_TABLET | Freq: Every day | ORAL | 0 refills | Status: DC

## 2021-04-02 MED ORDER — AMOXICILLIN-POT CLAVULANATE 875-125 MG PO TABS: 1.0000 | ORAL_TABLET | Freq: Two times a day (BID) | ORAL | 0 refills | Status: DC

## 2021-04-02 MED ORDER — IOHEXOL 300 MG/ML  SOLN
75.0000 mL | Freq: Once | INTRAMUSCULAR | Status: AC | PRN
  Administered 2021-04-02: 75 mL via INTRAVENOUS

## 2021-04-02 NOTE — ED Triage Notes (Signed)
Pt c/o throat and chest feels like its been scratched with sandpaper. She feels 10 x worse then she did Thurs. Pt feels meds aren't working and it hurts to swallow, talk and breathe. Pt states it hurts to cough and breathe and in a lot of pain. Pt gave nurse cell phone typed with complaints so pt didn't have to talk.

## 2021-04-02 NOTE — Discharge Instructions (Addendum)
Take Augmentin twice daily for the next 7 days.  Additionally, take azithromycin.  On the first day take 2 tablets of azithromycin, after that take 1 tablet daily until completed. Information is provided for Dr. Melissa Montane.  He is in the ears, nose, throat physician.  If you are not feeling better by the end of the week call him to schedule follow-up.

## 2021-04-02 NOTE — ED Notes (Signed)
Informed pt of need for urine sample and pt ambulated to bathroom and attempted to provide urine sample but was unable to do so and states that she would like to attempt it later

## 2021-04-02 NOTE — ED Provider Notes (Signed)
[facility] EMERGENCY DEPARTMENT Provider Note   CSN: [id number] Arrival date & time: 04/02/21  0844     History Chief Complaint  Patient presents with   Sore Throat    Valerie Huber is a 35 y.o. female.  HPI  Patient presents due to sore throat and difficulty swallowing.  She is been having the symptoms since Thursday, she was evaluated at the urgent care at the time and started on amoxicillin, viscous lidocaine.  She is also tried cough syrup, cough drops, flu medicine without any relief.  Symptoms are worsening in nature, associated with body aches, headache.  She denies any fevers at home.  Reports it is very difficult to swallow and that her throat is so sore she cannot speak.  She feels short of breath secondary to coughing.  Denies any pain in her chest.  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   Endometriosis    Irregular periods 12/31/2014   Pelvic pain in female 12/31/2014   Pregnant 04/14/2015   Second degree uterine prolapse 12/31/2014   Seizures ([place])    Vaginal Pap smear, abnormal     Patient Active Problem List   Diagnosis Date Noted   DUB (dysfunctional uterine bleeding)    Dysmenorrhea    Dyspareunia, female    History of endometriosis    Chest pain 11/07/2020   Seasonal allergic rhinitis due to pollen 09/01/2020   Encounter for examination following treatment at hospital 04/12/2020   Heme positive stool 06/30/2019   Chronic diarrhea 06/30/2019   Rectal bleeding    Complex regional pain syndrome type 1 of right lower extremity 05/20/2017   Depression 11/30/2015   H/O: substance abuse ([place]) 11/30/2015   History of seizures 11/30/2015   Marijuana use 07/05/2015   Moderate dysplasia of cervix (CIN II)  at endocervical margin of LEEP specimen 01/22/2015   Pelvic pain in female 12/31/2014   Endometriosis determined by laparoscopy 12/31/2014   Second degree uterine prolapse 12/31/2014   Irregular periods  12/31/2014   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/21/2014   Hematemesis with nausea    Anaphylaxis 09/04/2011    Past Surgical History:  Procedure Laterality Date   BIOPSY  07/06/2019   Procedure: BIOPSY;  Surgeon: Daneil Dolin, MD;  Location: [facility] END[facility];  Service: Endoscopy;;  colon    COLONOSCOPY WITH PROPOFOL N/A 07/06/2019   one 3 mm polyp hyperplastic, segmental biopsies negative   DILITATION & CURRETTAGE/HYSTROSCOPY WITH NOVASURE ABLATION N/A 10/08/2017   Procedure: DILATATION & CURETTAGE/HYSTEROSCOPY WITH MINERVA  ENDOMETRIAL ABLATION (Procedure #2);  Surgeon: [name], MD;  Location: [facility] ORS;  Service: Gynecology;  Laterality: N/A;   ESOPHAGOGASTRODUODENOSCOPY N/A 12/10/2014   SLF: hematoemesis due to moderate erosive gastritis, duoedentitis, and duodenal ulcer   LEEP     POLYPECTOMY  07/06/2019   Procedure: POLYPECTOMY;  Surgeon: Daneil Dolin, MD;  Location: [facility];  Service: Endoscopy;;   surgery for endometriosis  2005   TUBAL LIGATION Bilateral 10/08/2017   Procedure: BILATERAL TUBAL STERILIZATION WITH FALLOPE RINGS APPLICATION (Procedure #1);  Surgeon: [name], MD;  Location: [facility] ORS;  Service: Gynecology;  Laterality: Bilateral;   VAGINAL HYSTERECTOMY N/A 12/14/2020   Procedure: TOTAL HYSTERECTOMY VAGINAL;  Surgeon: Florian Buff, MD;  Location: [facility] ORS;  Service: Gynecology;  Laterality: N/A;     OB History     Gravida  3   Para  2   Term  2   Preterm      AB  1   Living  2      SAB      IAB  1   Ectopic      Multiple  0   Live Births  2           Family History  Problem Relation Age of Onset   Cancer Mother        kidney    Asthma Mother    Hypertension Mother    Colon polyps Sister        29, large polyp, done at 16    Cancer Paternal Grandmother    Asthma Son    Colon cancer Neg Hx     Social History   Tobacco Use   Smoking status: Former    Packs/day: 0.25    Years: 14.00    Pack years: 3.50     Types: Cigarettes   Smokeless tobacco: Never   Tobacco comments:    quit in 2017   Vaping Use   Vaping Use: Never used  Substance Use Topics   Alcohol use: Not Currently    Comment: occ   Drug use: Yes    Types: Marijuana    Home Medications Prior to Admission medications   Medication Sig Start Date End Date Taking? Authorizing Provider  acetaminophen (TYLENOL) 500 MG tablet Take 500-1,500 mg by mouth every 6 (six) hours as needed for moderate pain.    [provider]  alum & mag hydroxide-simeth (MYLANTA) 200-200-20 MG/5ML suspension Take 30 mLs by mouth every 6 (six) hours as needed for indigestion or heartburn. Patient not taking: Reported on 03/31/2021 11/07/20   Noreene Larsson, NP  amoxicillin (AMOXIL) 875 MG tablet Take 1 tablet (875 mg total) by mouth 2 (two) times daily. 03/31/21   Volney American, PA-C  diphenhydrAMINE (BENADRYL ALLERGY) 25 mg capsule Take 25 mg by mouth every 6 (six) hours as needed.    [provider]  DM-Doxylamine-Acetaminophen (NYQUIL COLD & FLU PO) Take by mouth.    [provider]  levocetirizine (XYZAL) 5 MG tablet TAKE 1 TABLET(5 MG) BY MOUTH EVERY EVENING Patient not taking: Reported on 03/31/2021 11/28/20   Noreene Larsson, NP  levofloxacin (LEVAQUIN) 500 MG tablet Take 1 tablet (500 mg total) by mouth daily. 12/14/20   Florian Buff, MD  lidocaine (XYLOCAINE) 2 % solution Use as directed 10 mLs in the mouth or throat every 3 (three) hours as needed for mouth pain. 03/31/21   Volney American, PA-C  ondansetron (ZOFRAN ODT) 8 MG disintegrating tablet Take 1 tablet (8 mg total) by mouth every 8 (eight) hours as needed for nausea or vomiting. Patient not taking: Reported on 03/31/2021 12/14/20   Florian Buff, MD  oxyCODONE-acetaminophen (PERCOCET) 7.5-325 MG tablet Take 1-2 tablets by mouth every 6 (six) hours as needed. Patient not taking: Reported on 03/31/2021 12/14/20   Florian Buff, MD  pantoprazole (PROTONIX)  40 MG tablet Take 1 tablet (40 mg total) by mouth daily. Patient not taking: Reported on 03/31/2021 09/01/20   Lindell Spar, MD  Phenylephrine-DM-GG-[employer] FAST-MAX COLD/FLU PO) Take by mouth.    [provider]    Allergies    Flexeril [cyclobenzaprine] and Ibuprofen  Review of Systems   Review of Systems  Constitutional:  Negative for fever.  HENT:  Positive for congestion, sore throat, trouble swallowing and voice change.   Respiratory:  Positive for cough  and shortness of breath.   Cardiovascular:  Negative for chest pain.  Gastrointestinal:  Negative for nausea and vomiting.  Musculoskeletal:  Positive for myalgias.   Physical Exam Updated Vital Signs BP (!) 136/92 (BP Location: Left Arm)   Pulse 82   Temp 99 F (37.2 C) (Oral)   Resp 17   Ht 5\' 4"  (1.626 m)   Wt 77.1 kg   LMP 12/08/2020   SpO2 98%   BMI 29.18 kg/m   Physical Exam Vitals and nursing note reviewed. Exam conducted with a chaperone present.  Constitutional:      Appearance: Normal appearance.  HENT:     Head: Normocephalic.     Nose: Congestion present.     Mouth/Throat:     Pharynx: Oropharyngeal exudate, posterior oropharyngeal erythema and uvula swelling present.     Comments: Uvula deviation Eyes:     Extraocular Movements: Extraocular movements intact.     Pupils: Pupils are equal, round, and reactive to light.  Cardiovascular:     Rate and Rhythm: Normal rate and regular rhythm.  Pulmonary:     Effort: Pulmonary effort is normal.     Breath sounds: Normal breath sounds.     Comments: Lungs CTA bilaterally. No accessory muscle use. Speaking in complete sentences.  Abdominal:     General: Abdomen is flat.     Palpations: Abdomen is soft.  Musculoskeletal:     Cervical back: Normal range of motion.  Neurological:     Mental Status: She is alert.  Psychiatric:        Mood and Affect: Mood normal.   ED Results / Procedures / Treatments   Labs (all labs ordered are  listed, but only abnormal results are displayed) Labs Reviewed  RESP PANEL BY RT-PCR (FLU A&B, COVID) ARPGX2  GROUP A STREP BY PCR  BASIC METABOLIC PANEL  CBC WITH DIFFERENTIAL/PLATELET  PREGNANCY, URINE    EKG None  Radiology No results found.  Procedures Procedures   Medications Ordered in ED Medications  morphine 4 MG/ML injection 4 mg (has no administration in time range)  ondansetron (ZOFRAN-ODT) disintegrating tablet 8 mg (has no administration in time range)    ED Course  I have reviewed the triage vital signs and the nursing notes.  Pertinent labs & imaging results that were available during my care of the patient were reviewed by me and considered in my medical decision making (see chart for details).    MDM Rules/Calculators/A&P                           Physical exam is concerning for tonsillitis versus retropharyngeal abscess.  Unable to visualize any peritonsillar abscess, there is some tonsillar swelling as well as uvular swelling.  We will proceed with CT soft tissue neck.  We will also test for strep, COVID and get an x-ray given the persistent cough.  Viral panel is negative for COVID, flu.  Strep test is also negative.  No leukocytosis, no anemia.  No gross electrolyte derangement on the BMP, kidney function fully intact for CT with contrast.  X-rays notable for mild opacity at the left lung base which could be consistent with a pneumonia.  She is already taking amoxicillin.  CT neck consistent with tonsillitis, no abscess or epiglottis. Will discharge on augmentin and zpac and give ENT follow up. Discharged in stable condition.   Final Clinical Impression(s) / ED Diagnoses Final diagnoses:  SOB (shortness of breath)  Rx / DC Orders ED Discharge Orders     None        Sherrill Raring, PA-C 04/02/21 [place], [place], DO 04/03/21 0981

## 2021-04-03 ENCOUNTER — Telehealth: Payer: Self-pay

## 2021-04-03 NOTE — Telephone Encounter (Signed)
Transition Care Management Follow-up Telephone Call Date of discharge and from where: 04/02/2021 from [facility]. How have you been since you were released from the hospital? Pt stated that she is still in pain. Pt had difficulty talking due to the pain from coughing. Pt has picked up abx rx'ed from ED. Pt stated that she will have some one call ENT to schedule appt.  Any questions or concerns? No  Items Reviewed: Did the pt receive and understand the discharge instructions provided? Yes  Medications obtained and verified? Yes  Other? No  Any new allergies since your discharge? No  Dietary orders reviewed? No Do you have support at home? Yes   Functional Questionnaire: (I = Independent and D = Dependent) ADLs: I  Bathing/Dressing- I  Meal Prep- I  Eating- I  Maintaining continence- I  Transferring/Ambulation- I  Managing Meds- I   Follow up appointments reviewed:  PCP Hospital f/u appt confirmed? No   Specialist Hospital f/u appt confirmed? No  Are transportation arrangements needed? No  If their condition worsens, is the pt aware to call PCP or go to the Emergency Dept.? Yes Was the patient provided with contact information for the PCP's office or ED? Yes Was to pt encouraged to call back with questions or concerns? Yes

## 2021-04-27 ENCOUNTER — Encounter: Payer: Self-pay | Admitting: Emergency Medicine

## 2021-04-27 ENCOUNTER — Ambulatory Visit
Admission: EM | Admit: 2021-04-27 | Discharge: 2021-04-27 | Disposition: A | Discharge status: Home / Self Care | Payer: Medicaid Other | Attending: Urgent Care | Admitting: Urgent Care

## 2021-04-27 ENCOUNTER — Other Ambulatory Visit: Payer: Self-pay

## 2021-04-27 DIAGNOSIS — L729 Follicular cyst of the skin and subcutaneous tissue, unspecified: Secondary | ICD-10-CM | POA: Diagnosis not present

## 2021-04-27 DIAGNOSIS — M542 Cervicalgia: Secondary | ICD-10-CM

## 2021-04-27 DIAGNOSIS — L089 Local infection of the skin and subcutaneous tissue, unspecified: Secondary | ICD-10-CM | POA: Diagnosis not present

## 2021-04-27 MED ORDER — DOXYCYCLINE HYCLATE 100 MG PO CAPS: 100.0000 mg | ORAL_CAPSULE | Freq: Two times a day (BID) | ORAL | 0 refills | Status: DC

## 2021-04-27 NOTE — Discharge Instructions (Signed)
Please keep your wound covered for the next 3 to 5 days.  Keep it clean and dry.  Use gentle soap and warm water to clean it.  Do not apply any peroxide, alcohol, antibiotic ointments or any creams.  Take the antibiotic doxycycline for the next 7 days and use ibuprofen as needed for pain and inflammation.

## 2021-04-27 NOTE — ED Provider Notes (Signed)
[place]   MRN: [id number] DOB: 05-11-1986  Subjective:   Valerie Huber is a 35 y.o. female presenting for 2 to 3-day history of acute onset worsening neck pain over mass that has been there for the last 3 years.  It has become progressively larger.  Unfortunately this past week has become a lot more painful and red.  It is not draining, has not had fever.  Of note, she just underwent a 2-week course of Augmentin for severe throat infection.  She completed the course at the beginning of December.  No current facility-administered medications for this encounter.  Current Outpatient Medications:    acetaminophen (TYLENOL) 500 MG tablet, Take 500-1,500 mg by mouth every 6 (six) hours as needed for moderate pain., Disp: , Rfl:    alum & mag hydroxide-simeth (MYLANTA) 427-062-37 MG/5ML suspension, Take 30 mLs by mouth every 6 (six) hours as needed for indigestion or heartburn. (Patient not taking: Reported on 03/31/2021), Disp: 355 mL, Rfl: 0   amoxicillin-clavulanate (AUGMENTIN) 875-125 MG tablet, Take 1 tablet by mouth every 12 (twelve) hours., Disp: 14 tablet, Rfl: 0   azithromycin (ZITHROMAX) 250 MG tablet, Take 1 tablet (250 mg total) by mouth daily. Take first 2 tablets together, then 1 every day until finished., Disp: 6 tablet, Rfl: 0   diphenhydrAMINE (BENADRYL) 25 mg capsule, Take 25 mg by mouth every 6 (six) hours as needed., Disp: , Rfl:    DM-Doxylamine-Acetaminophen (NYQUIL COLD & FLU PO), Take by mouth., Disp: , Rfl:    levocetirizine (XYZAL) 5 MG tablet, TAKE 1 TABLET(5 MG) BY MOUTH EVERY EVENING (Patient not taking: Reported on 03/31/2021), Disp: 30 tablet, Rfl: 2   lidocaine (XYLOCAINE) 2 % solution, Use as directed 10 mLs in the mouth or throat every 3 (three) hours as needed for mouth pain., Disp: 100 mL, Rfl: 0   ondansetron (ZOFRAN ODT) 8 MG disintegrating tablet, Take 1 tablet (8 mg total) by mouth every 8 (eight) hours as needed for nausea or vomiting.  (Patient not taking: Reported on 03/31/2021), Disp: 8 tablet, Rfl: 0   oxyCODONE-acetaminophen (PERCOCET) 7.5-325 MG tablet, Take 1-2 tablets by mouth every 6 (six) hours as needed. (Patient not taking: Reported on 03/31/2021), Disp: 30 tablet, Rfl: 0   pantoprazole (PROTONIX) 40 MG tablet, Take 1 tablet (40 mg total) by mouth daily. 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   Endometriosis    Irregular periods 12/31/2014   Pelvic pain in female 12/31/2014   Pregnant 04/14/2015   Second degree uterine prolapse 12/31/2014   Seizures ([place])    Vaginal Pap smear, abnormal      Past Surgical History:  Procedure Laterality Date   BIOPSY  07/06/2019   Procedure: BIOPSY;  Surgeon: Daneil Dolin, MD;  [facility];  Service: Endoscopy;;  colon    COLONOSCOPY WITH PROPOFOL N/A 07/06/2019   one 3 mm polyp hyperplastic, segmental biopsies negative   DILITATION & CURRETTAGE/HYSTROSCOPY WITH NOVASURE ABLATION N/A 10/08/2017   Procedure: DILATATION & CURETTAGE/HYSTEROSCOPY WITH MINERVA  ENDOMETRIAL ABLATION (Procedure #2);  Surgeon: [name], MD;  Location: [facility];  Service: Gynecology;  Laterality: N/A;  ESOPHAGOGASTRODUODENOSCOPY N/A 12/10/2014   SLF: hematoemesis due to moderate erosive gastritis, duoedentitis, and duodenal ulcer   LEEP     POLYPECTOMY  07/06/2019   Procedure: POLYPECTOMY;  Surgeon: Daneil Dolin, MD;  Location: [facility];  Service: Endoscopy;;   surgery for endometriosis  2005   TUBAL LIGATION Bilateral 10/08/2017   Procedure: BILATERAL TUBAL STERILIZATION WITH FALLOPE RINGS APPLICATION (Procedure #1);  Surgeon: [name], MD;   Location: [facility];  Service: Gynecology;  Laterality: Bilateral;   VAGINAL HYSTERECTOMY N/A 12/14/2020   Procedure: TOTAL HYSTERECTOMY VAGINAL;  Surgeon: Florian Buff, MD;  Location: [facility];  Service: Gynecology;  Laterality: N/A;    Family History  Problem Relation Age of Onset   Cancer Mother        kidney    Asthma Mother    Hypertension Mother    Colon polyps Sister        72, large polyp, done at 39    Cancer Paternal Grandmother    Asthma Son    Colon cancer Neg Hx     Social History   Tobacco Use   Smoking status: Former    Packs/day: 0.25    Years: 14.00    Pack years: 3.50    Types: Cigarettes   Smokeless tobacco: Never   Tobacco comments:    quit in 2017   Vaping Use   Vaping Use: Never used  Substance Use Topics   Alcohol use: Not Currently    Comment: occ   Drug use: Yes    Types: Marijuana    ROS   Objective:   Vitals: BP 125/84 (BP Location: Right Arm)    Pulse 89    Temp 99 F (37.2 C) (Oral)    Resp 18    LMP 12/08/2020    SpO2 98%   Physical Exam Constitutional:      General: She is not in acute distress.    Appearance: Normal appearance. She is well-developed. She is not ill-appearing.  HENT:     Head: Normocephalic and atraumatic.     Nose: Nose normal.     Mouth/Throat:     Mouth: Mucous membranes are moist.     Pharynx: Oropharynx is clear.  Eyes:     General: No scleral icterus.    Extraocular Movements: Extraocular movements intact.     Pupils: Pupils are equal, round, and reactive to light.  Neck:   Cardiovascular:     Rate and Rhythm: Normal rate.  Pulmonary:     Effort: Pulmonary effort is normal.  Skin:    General: Skin is warm and dry.  Neurological:     General: No focal deficit present.     Mental Status: She is alert and oriented to person, place, and time.  Psychiatric:        Mood and Affect: Mood normal.        Behavior: Behavior normal.    PROCEDURE NOTE: I&D of Abscess Verbal consent obtained. Local  anesthesia with 1cc of 2% lidocaine with epinephrine. Site cleansed with Betadine. Incision of 1/4cm was made using an 11 blade, no drainage was expressed but an epidermoid cyst was excised in pieces, in its entirety. Wound cavity was explored with curved hemostats and loculations loosened. Cleansed and dressed.   Assessment and Plan :   PDMP not reviewed this encounter.  1. Infected cyst of skin   2. Neck pain    Successful removal of an infected cyst of the neck  as depicted.  No complications were had during the procedure.  Patient is to start doxycycline and use supportive care otherwise. Counseled patient on potential for adverse effects with medications prescribed/recommended today, ER and return-to-clinic precautions discussed, patient verbalized understanding.    Jaynee Eagles, PA-C 04/27/21 1820

## 2021-04-27 NOTE — ED Triage Notes (Signed)
bump on right side of neck.  Has gotten bigger over the past 3 years.  Over the past few days, pains have been shoot up neck and area has gotten larger.

## 2021-05-04 ENCOUNTER — Other Ambulatory Visit: Payer: Self-pay

## 2021-05-04 ENCOUNTER — Ambulatory Visit: Payer: Medicaid Other | Admitting: Nurse Practitioner

## 2021-05-04 ENCOUNTER — Ambulatory Visit
Admission: EM | Admit: 2021-05-04 | Discharge: 2021-05-04 | Disposition: A | Discharge status: Home / Self Care | Payer: Medicaid Other | Attending: Family Medicine | Admitting: Family Medicine

## 2021-05-04 DIAGNOSIS — L659 Nonscarring hair loss, unspecified: Secondary | ICD-10-CM

## 2021-05-04 DIAGNOSIS — R221 Localized swelling, mass and lump, neck: Secondary | ICD-10-CM

## 2021-05-04 MED ORDER — AMOXICILLIN-POT CLAVULANATE 875-125 MG PO TABS: 1.0000 | ORAL_TABLET | Freq: Two times a day (BID) | ORAL | 0 refills | Status: DC

## 2021-05-04 MED ORDER — KETOCONAZOLE 2 % EX SHAM: 1.0000 "application " | MEDICATED_SHAMPOO | CUTANEOUS | 0 refills | Status: DC

## 2021-05-04 NOTE — ED Provider Notes (Signed)
[facility]    CSN: [id number] Arrival date & time: 05/04/21  1757      History   Chief Complaint Chief Complaint  Patient presents with   Cyst    Cyst or knot on neck     HPI Valerie Huber is a 35 y.o. female.   Patient presenting today with new onset swollen lump right side of neck just above an infected cyst that she had removed last week here at the urgent care.  She completed a course of doxycycline and the area is healing well but now this morning she had pain and swelling in this new area.  Denies fever, chills, sore throat, drainage or redness to the area, difficulty breathing or swallowing.  No injury to the area that she is aware of.  Taking Tylenol with minimal relief of pain.  She is also noting hair loss to the top of her head, flaking, scaling.  Not tried anything for the symptoms.  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   Endometriosis    Irregular periods 12/31/2014   Pelvic pain in female 12/31/2014   Pregnant 04/14/2015   Second degree uterine prolapse 12/31/2014   Seizures ([place])    Vaginal Pap smear, abnormal    Patient Active Problem List   Diagnosis Date Noted   DUB (dysfunctional uterine bleeding)    Dysmenorrhea    Dyspareunia, female    History of endometriosis    Chest pain 11/07/2020   Seasonal allergic rhinitis due to pollen 09/01/2020   Encounter for examination following treatment at hospital 04/12/2020   Heme positive stool 06/30/2019   Chronic diarrhea 06/30/2019   Rectal bleeding    Complex regional pain syndrome type 1 of right lower extremity 05/20/2017   Depression 11/30/2015   H/O: substance abuse ([place]) 11/30/2015   History of seizures 11/30/2015   Marijuana use 07/05/2015   Moderate dysplasia of cervix (CIN II)  at endocervical margin of LEEP specimen 01/22/2015   Pelvic pain in female 12/31/2014   Endometriosis determined by laparoscopy 12/31/2014   Second degree uterine  prolapse 12/31/2014   Irregular periods 12/31/2014   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/21/2014   Hematemesis with nausea    Anaphylaxis 09/04/2011   Past Surgical History:  Procedure Laterality Date   BIOPSY  07/06/2019   Procedure: BIOPSY;  Surgeon: Daneil Dolin, MD;  [facility];  Service: Endoscopy;;  colon    COLONOSCOPY WITH PROPOFOL N/A 07/06/2019   one 3 mm polyp hyperplastic, segmental biopsies negative   DILITATION & CURRETTAGE/HYSTROSCOPY WITH NOVASURE ABLATION N/A 10/08/2017   Procedure: DILATATION & CURETTAGE/HYSTEROSCOPY WITH MINERVA  ENDOMETRIAL ABLATION (Procedure #2);  Surgeon: [name], MD;  Location: [facility];  Service: Gynecology;  Laterality: N/A;   ESOPHAGOGASTRODUODENOSCOPY N/A 12/10/2014   SLF: hematoemesis due to moderate erosive gastritis, duoedentitis, and duodenal ulcer   LEEP     POLYPECTOMY  07/06/2019   Procedure: POLYPECTOMY;  Surgeon: Daneil Dolin, MD;  [facility];  Service: Endoscopy;;   surgery for endometriosis  2005   TUBAL LIGATION Bilateral 10/08/2017   Procedure: BILATERAL TUBAL STERILIZATION WITH FALLOPE RINGS APPLICATION (Procedure #1);  Surgeon: [name], MD;  Location: [facility];  Service: Gynecology;  Laterality: Bilateral;   VAGINAL HYSTERECTOMY N/A 12/14/2020   Procedure: TOTAL HYSTERECTOMY VAGINAL;  Surgeon: Florian Buff, MD;  Location: [facility];  Service: Gynecology;  Laterality: N/A;  OB History     Gravida  3   Para  2   Term  2   Preterm      AB  1   Living  2      SAB      IAB  1   Ectopic      Multiple  0   Live Births  2           Home Medications    Prior to Admission medications   Medication Sig Start Date End Date Taking? Authorizing Provider  ketoconazole (NIZORAL) 2 % shampoo Apply 1 application topically 2 (two) times a week. 05/04/21  Yes Volney American, PA-C  acetaminophen (TYLENOL) 500 MG tablet Take 500-1,500 mg by mouth every 6 (six) hours as  needed for moderate pain.    [provider]  alum & mag hydroxide-simeth (MYLANTA) 200-200-20 MG/5ML suspension Take 30 mLs by mouth every 6 (six) hours as needed for indigestion or heartburn. Patient not taking: Reported on 03/31/2021 11/07/20   Noreene Larsson, NP  amoxicillin-clavulanate (AUGMENTIN) 875-125 MG tablet Take 1 tablet by mouth every 12 (twelve) hours. 05/04/21   Volney American, PA-C  azithromycin (ZITHROMAX) 250 MG tablet Take 1 tablet (250 mg total) by mouth daily. Take first 2 tablets together, then 1 every day until finished. 04/02/21   Sherrill Raring, PA-C  diphenhydrAMINE (BENADRYL) 25 mg capsule Take 25 mg by mouth every 6 (six) hours as needed.    [provider]  DM-Doxylamine-Acetaminophen (NYQUIL COLD & FLU PO) Take by mouth.    [provider]  doxycycline (VIBRAMYCIN) 100 MG capsule Take 1 capsule (100 mg total) by mouth 2 (two) times daily. 04/27/21   Jaynee Eagles, PA-C  levocetirizine (XYZAL) 5 MG tablet TAKE 1 TABLET(5 MG) BY MOUTH EVERY EVENING Patient not taking: Reported on 03/31/2021 11/28/20   Noreene Larsson, NP  lidocaine (XYLOCAINE) 2 % solution Use as directed 10 mLs in the mouth or throat every 3 (three) hours as needed for mouth pain. 03/31/21   Volney American, PA-C  ondansetron (ZOFRAN ODT) 8 MG disintegrating tablet Take 1 tablet (8 mg total) by mouth every 8 (eight) hours as needed for nausea or vomiting. Patient not taking: Reported on 03/31/2021 12/14/20   Florian Buff, MD  oxyCODONE-acetaminophen (PERCOCET) 7.5-325 MG tablet Take 1-2 tablets by mouth every 6 (six) hours as needed. Patient not taking: Reported on 03/31/2021 12/14/20   Florian Buff, MD  pantoprazole (PROTONIX) 40 MG tablet Take 1 tablet (40 mg total) by mouth daily. Patient not taking: Reported on 03/31/2021 09/01/20   Lindell Spar, MD  Phenylephrine-DM-GG-APAP [facility] FAST-MAX COLD/FLU PO) Take by mouth.    [provider]   Family  History Family History  Problem Relation Age of Onset   Cancer Mother        kidney    Asthma Mother    Hypertension Mother    Colon polyps Sister        35, large polyp, done at 49    Cancer Paternal Grandmother    Asthma Son    Colon cancer Neg Hx     Social History Social History   Tobacco Use   Smoking status: Former    Packs/day: 0.25    Years: 14.00    Pack years: 3.50    Types: Cigarettes   Smokeless tobacco: Never   Tobacco comments:    quit in 2017   Vaping Use  Vaping Use: Never used  Substance Use Topics   Alcohol use: Not Currently    Comment: occ   Drug use: Yes    Types: Marijuana    Allergies   Flexeril [cyclobenzaprine] and Ibuprofen   Review of Systems Review of Systems Per HPI  Physical Exam Triage Vital Signs ED Triage Vitals  Enc Vitals Group     BP 05/04/21 1809 117/81     Pulse Rate 05/04/21 1809 95     Resp 05/04/21 1809 18     Temp 05/04/21 1809 99.7 F (37.6 C)     Temp Source 05/04/21 1809 Oral     SpO2 05/04/21 1809 98 %     Weight --      Height --      Head Circumference --      Peak Flow --      Pain Score 05/04/21 1808 4     Pain Loc --      Pain Edu? --      Excl. in [place]? --    No data found.  Updated Vital Signs BP 117/81 (BP Location: Right Arm)    Pulse 95    Temp 99.7 F (37.6 C) (Oral)    Resp 18    LMP 12/08/2020    SpO2 98%   Visual Acuity Right Eye Distance:   Left Eye Distance:   Bilateral Distance:    Right Eye Near:   Left Eye Near:    Bilateral Near:     Physical Exam Vitals and nursing note reviewed.  Constitutional:      Appearance: Normal appearance. She is not ill-appearing.  HENT:     Head: Atraumatic.     Nose: Nose normal.     Mouth/Throat:     Mouth: Mucous membranes are moist.     Pharynx: Oropharynx is clear. No oropharyngeal exudate or posterior oropharyngeal erythema.  Eyes:     Extraocular Movements: Extraocular movements intact.     Conjunctiva/sclera: Conjunctivae  normal.  Neck:     Comments: Incision to the lower portion of the right side of neck from last week healing well without evidence of infection or seroma.  About 1 inch above this is a firm tender mass, most consistent on palpation to a lymph node that is edematous.  No erythema, fluctuance, active drainage from this area. Cardiovascular:     Rate and Rhythm: Normal rate and regular rhythm.     Heart sounds: Normal heart sounds.  Pulmonary:     Effort: Pulmonary effort is normal.     Breath sounds: Normal breath sounds. No wheezing or rales.  Musculoskeletal:        General: Normal range of motion.     Cervical back: Normal range of motion. Tenderness present.  Skin:    General: Skin is warm and dry.     Comments: 1 cm region of hair loss, scalp flaking and peeling in this area.  Neurological:     Mental Status: She is alert and oriented to person, place, and time.     Motor: No weakness.     Gait: Gait normal.  Psychiatric:        Mood and Affect: Mood normal.        Thought Content: Thought content normal.        Judgment: Judgment normal.   UC Treatments / Results  Labs (all labs ordered are listed, but only abnormal results are displayed) Labs Reviewed - No data to display  EKG   Radiology No results found.  Procedures Procedures (including critical care time)  Medications Ordered in UC Medications - No data to display  Initial Impression / Assessment and Plan / UC Course  I have reviewed the triage vital signs and the nursing notes.  Pertinent labs & imaging results that were available during my care of the patient were reviewed by me and considered in my medical decision making (see chart for details).     Unclear if new area of neck swelling and mass representative of a swollen lymph node versus a new cyst forming.  No evidence of active infection to the area but will start antibiotics in case infection beginning.  Discussed warm compresses, over-the-counter pain  relievers.  Return precautions reviewed.  Follow-up with PCP next week and recommend ultrasound if area is not resolving.  Regarding area of hair loss, possibly a new tinea capitis infection.  Treat with ketoconazole shampoo and follow-up with PCP on this.  Final Clinical Impressions(s) / UC Diagnoses   Final diagnoses:  Neck swelling  Hair loss   Discharge Instructions   None    ED Prescriptions     Medication Sig Dispense Auth. Provider   amoxicillin-clavulanate (AUGMENTIN) 875-125 MG tablet Take 1 tablet by mouth every 12 (twelve) hours. 14 tablet Volney American, [place]   ketoconazole (NIZORAL) 2 % shampoo Apply 1 application topically 2 (two) times a week. 120 mL Volney American, [place]      PDMP not reviewed this encounter.   Volney American, [place] 05/04/21 [phone]

## 2021-05-04 NOTE — ED Triage Notes (Signed)
Patient states she had her cyst on her neck last week so she came in and had it removed. She states that now she has another cyst on her right side of her neck but it is not draining fluid like the last one.   She states she finished all of the medication that was prescribed last week .   She states there is pain in the cyst  She states she thinks she is having a side effect to the medicine because she has lost a large amount of hair from the top of her head.

## 2021-05-09 ENCOUNTER — Ambulatory Visit: Payer: Medicaid Other | Admitting: Internal Medicine

## 2021-05-09 ENCOUNTER — Encounter: Payer: Self-pay | Admitting: Internal Medicine

## 2021-05-09 ENCOUNTER — Other Ambulatory Visit: Payer: Self-pay

## 2021-05-09 VITALS — BP 118/82 | HR 85 | Resp 18 | Ht 64.0 in | Wt 179.0 lb

## 2021-05-09 DIAGNOSIS — L089 Local infection of the skin and subcutaneous tissue, unspecified: Secondary | ICD-10-CM

## 2021-05-09 DIAGNOSIS — J0391 Acute recurrent tonsillitis, unspecified: Secondary | ICD-10-CM

## 2021-05-09 DIAGNOSIS — L729 Follicular cyst of the skin and subcutaneous tissue, unspecified: Secondary | ICD-10-CM | POA: Diagnosis not present

## 2021-05-09 NOTE — Progress Notes (Signed)
Acute Office Visit  Subjective:    Patient ID: Valerie Huber, female    DOB: 1985/12/29, 35 y.o.   MRN: [id number]  Chief Complaint  Patient presents with   Follow-up    Pt went to urgent care 04-27-21 had small cyst lanced on neck then the following week on 05-04-21 pt had swollen lymph node on the same right side of neck was told to follow up with pcp     HPI Patient is in today for evaluation after recent Urgent care visit for LAD in the neck area.  She had infected cyst in the right neck area, which was excised at urgent care.  She had another urgent care visit for lymph node swelling in the right neck area behind the jawline, for which she was given Augmentin.  Lymph node was painful and swollen at that time, which has improved now.  She states that she has had multiple rounds of antibiotics in the last few months for tonsillitis.  She has not seen ENT specialist yet.  She has chronic throat pain, but denies any fever, chills or dysphonia currently.  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   Endometriosis    Irregular periods 12/31/2014   Pelvic pain in female 12/31/2014   Pregnant 04/14/2015   Second degree uterine prolapse 12/31/2014   Seizures ([place])    Vaginal Pap smear, abnormal     Past Surgical History:  Procedure Laterality Date   BIOPSY  07/06/2019   Procedure: BIOPSY;  Surgeon: Daneil Dolin, MD;  [facility];  Service: Endoscopy;;  colon    COLONOSCOPY WITH PROPOFOL N/A 07/06/2019   one 3 mm polyp hyperplastic, segmental biopsies negative   DILITATION & CURRETTAGE/HYSTROSCOPY WITH NOVASURE ABLATION N/A 10/08/2017   Procedure: DILATATION & CURETTAGE/HYSTEROSCOPY WITH MINERVA  ENDOMETRIAL ABLATION (Procedure #2);  Surgeon: [name], MD;  Location: [facility];  Service: Gynecology;  Laterality: N/A;   ESOPHAGOGASTRODUODENOSCOPY N/A 12/10/2014   SLF: hematoemesis due to moderate erosive gastritis, duoedentitis,  and duodenal ulcer   LEEP     POLYPECTOMY  07/06/2019   Procedure: POLYPECTOMY;  Surgeon: Daneil Dolin, MD;  [facility];  Service: Endoscopy;;   surgery for endometriosis  2005   TUBAL LIGATION Bilateral 10/08/2017   Procedure: BILATERAL TUBAL STERILIZATION WITH FALLOPE RINGS APPLICATION (Procedure #1);  Surgeon: [name], MD;  Location[facility];  Service: Gynecology;  Laterality: Bilateral;   VAGINAL HYSTERECTOMY N/A 12/14/2020   Procedure: TOTAL HYSTERECTOMY VAGINAL;  Surgeon: Florian Buff, MD;  Location: [facility];  Service: Gynecology;  Laterality: N/A;    Family History  Problem Relation Age of Onset   Cancer Mother        kidney    Asthma Mother    Hypertension Mother    Colon polyps Sister        31, large polyp, done at 71    Cancer Paternal Grandmother    Asthma Son    Colon cancer Neg Hx     Social History   Socioeconomic History   Marital status: Single    Spouse name: Not on file   Number of children: Not on file   Years of education: Not on file   Highest education level: Not on file  Occupational History   Occupation: Housecleaning  Tobacco Use   Smoking status: Former    Packs/day: 0.25    Years: 14.00  Pack years: 3.50    Types: Cigarettes   Smokeless tobacco: Never   Tobacco comments:    quit in 2017   Vaping Use   Vaping Use: Never used  Substance and Sexual Activity   Alcohol use: Not Currently    Comment: occ   Drug use: Yes    Types: Marijuana   Sexual activity: Yes    Birth control/protection: Surgical    Comment: tubal and ablation  Other Topics Concern   Not on file  Social History Narrative   Not on file   Social Determinants of Health   Financial Resource Strain: Not on file  Food Insecurity: Not on file  Transportation Needs: Not on file  Physical Activity: Not on file  Stress: Not on file  Social Connections: Not on file  Intimate Partner Violence: Not on file    Outpatient Medications Prior to  Visit  Medication Sig Dispense Refill   acetaminophen (TYLENOL) 500 MG tablet Take 500-1,500 mg by mouth every 6 (six) hours as needed for moderate pain.     amoxicillin-clavulanate (AUGMENTIN) 875-125 MG tablet Take 1 tablet by mouth every 12 (twelve) hours. 14 tablet 0   ketoconazole (NIZORAL) 2 % shampoo Apply 1 application topically 2 (two) times a week. 120 mL 0   alum & mag hydroxide-simeth (MYLANTA) 161-096-04 MG/5ML suspension Take 30 mLs by mouth every 6 (six) hours as needed for indigestion or heartburn. (Patient not taking: Reported on 03/31/2021) 355 mL 0   azithromycin (ZITHROMAX) 250 MG tablet Take 1 tablet (250 mg total) by mouth daily. Take first 2 tablets together, then 1 every day until finished. (Patient not taking: Reported on 05/09/2021) 6 tablet 0   diphenhydrAMINE (BENADRYL) 25 mg capsule Take 25 mg by mouth every 6 (six) hours as needed. (Patient not taking: Reported on 05/09/2021)     DM-Doxylamine-Acetaminophen (NYQUIL COLD & FLU PO) Take by mouth. (Patient not taking: Reported on 05/09/2021)     doxycycline (VIBRAMYCIN) 100 MG capsule Take 1 capsule (100 mg total) by mouth 2 (two) times daily. (Patient not taking: Reported on 05/09/2021) 14 capsule 0   levocetirizine (XYZAL) 5 MG tablet TAKE 1 TABLET(5 MG) BY MOUTH EVERY EVENING (Patient not taking: Reported on 03/31/2021) 30 tablet 2   lidocaine (XYLOCAINE) 2 % solution Use as directed 10 mLs in the mouth or throat every 3 (three) hours as needed for mouth pain. (Patient not taking: Reported on 05/09/2021) 100 mL 0   ondansetron (ZOFRAN ODT) 8 MG disintegrating tablet Take 1 tablet (8 mg total) by mouth every 8 (eight) hours as needed for nausea or vomiting. (Patient not taking: Reported on 03/31/2021) 8 tablet 0   oxyCODONE-acetaminophen (PERCOCET) 7.5-325 MG tablet Take 1-2 tablets by mouth every 6 (six) hours as needed. (Patient not taking: Reported on 03/31/2021) 30 tablet 0   pantoprazole (PROTONIX) 40 MG tablet Take 1  tablet (40 mg total) by mouth daily. (Patient not taking: Reported on 03/31/2021) 30 tablet 3   Phenylephrine-DM-GG-APAP (MUCINEX FAST-MAX COLD/FLU PO) Take by mouth. (Patient not taking: Reported on 05/09/2021)     No facility-administered medications prior to visit.    Allergies  Allergen Reactions   Flexeril [Cyclobenzaprine] Hives   Ibuprofen Anaphylaxis    Throat, eyes, lips swollen    Review of Systems  Constitutional:  Negative for chills and fever.  HENT:  Negative for congestion, sinus pressure, sinus pain and sore throat.   Eyes:  Negative for pain and discharge.  Respiratory:  Negative  for cough and shortness of breath.   Cardiovascular:  Negative for chest pain and palpitations.  Gastrointestinal:  Negative for abdominal pain, diarrhea, nausea and vomiting.  Endocrine: Negative for polydipsia and polyuria.  Genitourinary:  Negative for dysuria and hematuria.  Musculoskeletal:  Negative for neck pain and neck stiffness.  Skin:  Negative for rash.  Neurological:  Negative for dizziness and weakness.  Psychiatric/Behavioral:  Negative for agitation and behavioral problems.       Objective:    Physical Exam Vitals reviewed.  Constitutional:      General: She is not in acute distress.    Appearance: She is not diaphoretic.  HENT:     Head: Normocephalic and atraumatic.     Nose: Nose normal.     Mouth/Throat:     Mouth: Mucous membranes are moist.  Eyes:     General: No scleral icterus.    Extraocular Movements: Extraocular movements intact.  Neck:     Comments: Tonsillar lymph node mildly enlarged Cardiovascular:     Rate and Rhythm: Normal rate and regular rhythm.     Pulses: Normal pulses.     Heart sounds: Normal heart sounds. No murmur heard. Pulmonary:     Breath sounds: Normal breath sounds. No wheezing or rales.  Musculoskeletal:     Cervical back: Neck supple. No tenderness.     Right lower leg: No edema.     Left lower leg: No edema.  Skin:     General: Skin is warm.     Findings: No rash.     Comments: Scar in the right side of neck area - C/D/I  Neurological:     General: No focal deficit present.     Mental Status: She is alert and oriented to person, place, and time.     Sensory: No sensory deficit.     Motor: No weakness.  Psychiatric:        Mood and Affect: Mood normal.        Behavior: Behavior normal.    BP 118/82 (BP Location: Left Arm, Patient Position: Sitting, Cuff Size: Normal)    Pulse 85    Resp 18    Ht 5\' 4"  (1.626 m)    Wt 179 lb 0.6 oz (81.2 kg)    LMP 12/08/2020    SpO2 100%    BMI 30.73 kg/m  Wt Readings from Last 3 Encounters:  05/09/21 179 lb 0.6 oz (81.2 kg)  04/02/21 170 lb (77.1 kg)  12/22/20 168 lb (76.2 kg)        Assessment & Plan:   Problem List Items Addressed This Visit    Visit Diagnoses     Recurrent tonsillitis    -  Primary Has had multiple rounds of antibiotics - currently on Augmentin Referred to ENT specialist Salt water gargling for symptomatic relief for now    Relevant Orders   Ambulatory referral to ENT   Infected cyst of skin     S/p excision, site C/D/I LAD could be due to local infection vs tonsillitis - now improved with Augmentin        No orders of the defined types were placed in this encounter.    Lindell Spar, MD

## 2021-05-09 NOTE — Patient Instructions (Signed)
Please complete the course of antibiotic as prescribed by Urgent care.  You are being referred to ENT specialist for recurrent tonsillitis.

## 2021-06-02 DIAGNOSIS — F4310 Post-traumatic stress disorder, unspecified: Secondary | ICD-10-CM | POA: Diagnosis not present

## 2021-06-02 DIAGNOSIS — F411 Generalized anxiety disorder: Secondary | ICD-10-CM | POA: Diagnosis not present

## 2021-06-09 DIAGNOSIS — F411 Generalized anxiety disorder: Secondary | ICD-10-CM | POA: Diagnosis not present

## 2021-06-09 DIAGNOSIS — F4310 Post-traumatic stress disorder, unspecified: Secondary | ICD-10-CM | POA: Diagnosis not present

## 2021-06-13 ENCOUNTER — Other Ambulatory Visit: Payer: Self-pay

## 2021-06-13 ENCOUNTER — Other Ambulatory Visit: Payer: Self-pay | Admitting: Obstetrics and Gynecology

## 2021-06-13 NOTE — Patient Outreach (Signed)
Care Coordination  06/13/2021  Maryn Freelove [birth year] [id number]  RNCM called patient at scheduled time.  Patient stated she was in the middle of taking an online test and asked to be called another time.  RNCM rescheduled appointment at patient's request.  Aida Raider RN, BSN [place] Management Coordinator - Managed [place] High Risk [phone].

## 2021-06-16 ENCOUNTER — Other Ambulatory Visit: Payer: Self-pay | Admitting: Obstetrics and Gynecology

## 2021-06-16 DIAGNOSIS — F411 Generalized anxiety disorder: Secondary | ICD-10-CM | POA: Diagnosis not present

## 2021-06-16 DIAGNOSIS — F4310 Post-traumatic stress disorder, unspecified: Secondary | ICD-10-CM | POA: Diagnosis not present

## 2021-06-16 NOTE — Patient Outreach (Signed)
Care Coordination  06/16/2021  Valerie Huber Sep 28, 1985 [id number]   Medicaid Managed Care   Unsuccessful Outreach Note  06/16/2021 Name: Valerie Huber MRN: [id number] DOB: [birth year]  Referred by: Lindell Spar, MD Reason for referral : High Risk Managed Medicaid (Unsuccessful telephone outreach)   An unsuccessful telephone outreach was attempted today. The patient was referred to the case management team for assistance with care management and care coordination.   Follow Up Plan: The care management team will reach out to the patient again over the next 7-14 days.   Aida Raider RN, BSN [place]   [facility] - Managed Medicaid High Risk [phone]

## 2021-06-16 NOTE — Patient Instructions (Signed)
Hi Ms. Yaklin, I'm sorry I missed you today,  I hope you are doing okay-  as a part of your Medicaid benefit, you are eligible for care management and care coordination services at no cost or copay. I was unable to reach you by phone today but would be happy to help you with your health related needs. Please feel free to call me at [phone].  A member of the Managed Medicaid care management team will reach out to you again over the next 7-14  days.   Aida Raider RN, BSN [place]   [facility] - Managed Medicaid High Risk [phone]

## 2021-06-23 ENCOUNTER — Other Ambulatory Visit: Payer: Self-pay | Admitting: Obstetrics and Gynecology

## 2021-06-23 ENCOUNTER — Other Ambulatory Visit: Payer: Self-pay

## 2021-06-23 DIAGNOSIS — F411 Generalized anxiety disorder: Secondary | ICD-10-CM | POA: Diagnosis not present

## 2021-06-23 DIAGNOSIS — F4310 Post-traumatic stress disorder, unspecified: Secondary | ICD-10-CM | POA: Diagnosis not present

## 2021-06-23 NOTE — Patient Outreach (Signed)
Medicaid Managed Care   Nurse Care Manager Note  06/23/2021 Name:  Valerie Huber MRN:  [id number] DOB:  11-Mar-1986  Valerie Huber is an 36 y.o. year old female who is a primary patient of Lindell Spar, MD.  The [facility] Managed Care Coordination team was consulted for assistance with:    Healthcare management needs, sore throat, abdominal pain, anxiety, depression, OCD  [name] was given information about Medicaid Managed Care Coordination team services today. Valerie Huber Patient agreed to services and verbal consent obtained.  Engaged with patient by telephone for follow up visit in response to provider referral for case management and/or care coordination services.   Assessments/Interventions:  Review of past medical history, allergies, medications, health status, including review of consultants reports, laboratory and other test data, was performed as part of comprehensive evaluation and provision of chronic care management services.  SDOH (Social Determinants of Health) assessments and interventions performed: SDOH Interventions    Flowsheet Row Most Recent Value  SDOH Interventions   Food Insecurity Interventions Intervention Not Indicated  Transportation Interventions Intervention Not Indicated       Care Plan  Allergies  Allergen Reactions   Flexeril [Cyclobenzaprine] Hives   Ibuprofen Anaphylaxis    Throat, eyes, lips swollen    Medications Reviewed Today     Reviewed by Valerie Medicus, RN (Registered Nurse) on 06/23/21 at 1447  Med List Status: <None>   Medication Order Taking? Sig Documenting Provider Last Dose Status Informant  acetaminophen (TYLENOL) 500 MG tablet [id number] Yes Take 500-1,500 mg by mouth every 6 (six) hours as needed for moderate pain. [provider] Taking Active Self  amoxicillin-clavulanate (AUGMENTIN) 875-125 MG tablet [id number] No Take 1 tablet by mouth every 12 (twelve) hours.  Patient not taking: Reported on  06/23/2021   Volney American, [place] Not Taking Active   ketoconazole (NIZORAL) 2 % shampoo [id number] Yes Apply 1 application topically 2 (two) times a week. Volney American, [place] Taking Active            Patient Active Problem List   Diagnosis Date Noted   DUB (dysfunctional uterine bleeding)    Dysmenorrhea    Dyspareunia, female    History of endometriosis    Chest pain 11/07/2020   Seasonal allergic rhinitis due to pollen 09/01/2020   Encounter for examination following treatment at hospital 04/12/2020   Heme positive stool 06/30/2019   Chronic diarrhea 06/30/2019   Rectal bleeding    Complex regional pain syndrome type 1 of right lower extremity 05/20/2017   Depression 11/30/2015   H/O: substance abuse ([place]) 11/30/2015   History of seizures 11/30/2015   Marijuana use 07/05/2015   Moderate dysplasia of cervix (CIN II)  at endocervical margin of LEEP specimen 01/22/2015   Pelvic pain in female 12/31/2014   Endometriosis determined by laparoscopy 12/31/2014   Second degree uterine prolapse 12/31/2014   Irregular periods 12/31/2014   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/21/2014   Hematemesis with nausea    Anaphylaxis 09/04/2011   Conditions to be addressed/monitored per PCP order:  Healthcare management needs, sore throat, abdominal pain, anxiety, depression, OCD  Care Plan : RN Care Manager Plan of Care  Updates made by Valerie Medicus, RN since 06/23/2021 12:00 AM     Problem: Chronic Disease Management and Care Coordiantion Needs   Priority: High     Goal: Self-Management Plan Developed   Start Date: 06/23/2021  Expected End Date: 09/20/2021  This Visit's Progress: Not on  track  Priority: High  Note:    Current Barriers:  Knowledge Deficits related to plan of care for management of sore throat off and on  Care Coordination needs related to sore throat off and on  RNCM Clinical Goal(s):  Patient will verbalize understanding of plan for management  of sore throat  as evidenced by contacting provider regarding appointment verbalize basic understanding of  evaluation of sore throat  disease process and self health management plan as evidenced by scheduling an appointment with provider  take all medications exactly as prescribed and will call provider for medication related questions demonstrate understanding of rationale for each prescribed medication attend all scheduled medical appointments continue to work with RN Care Manager to address care management and care coordination needs related to  sore throat off and on as evidenced by adherence to CM Team Scheduled appointments through collaboration with RN Care manager, provider, and care team.   Interventions: Inter-disciplinary care team collaboration (see longitudinal plan of care) Evaluation of current treatment plan related to  self management and patient's adherence to plan as established by provider    (Status:  New goal.)  Long Term Goal Evaluation of current treatment plan related to  sore throat off and on  Discussed plans with patient for ongoing care management follow up and provided patient with direct contact information for care management team Evaluation of current treatment plan related to sore throat off and on  and patient's adherence to plan as established by provider Advised patient to contact provider regarding appointment Reviewed medications with patient Reviewed scheduled/upcoming provider appointments  Discussed plans with patient for ongoing care management follow up and provided patient with direct contact information for care management team Assessed social determinant of health barriers  Patient Goals/Self-Care Activities: Take all medications as prescribed Attend all scheduled provider appointments Call pharmacy for medication refills 3-7 days in advance of running out of medications Perform all self care activities independently  Perform IADL's (shopping,  preparing meals, housekeeping, managing finances) independently Call provider office for new concerns or questions   Follow Up Plan:  The patient has been provided with contact information for the care management team and has been advised to call with any health related questions or concerns.  The care management team will reach out to the patient again over the next 30 days.    Long-Range Goal: Establish Plan of Care  for Chronic Disease Management Needs   Start Date: 06/23/2021  Expected End Date: 09/20/2021  Priority: High  Note:   Timeframe:  Long-Range Goal Priority:  High Start Date:       06/23/21                      Expected End Date:   ongoing                    Follow Up Date 07/21/21    - practice safe sex - schedule appointment for flu shot - schedule appointment for vaccines needed due to my age or health - schedule recommended health tests (blood work, mammogram, colonoscopy, pap test) - schedule and keep appointment for annual check-up    Why is this important?   Screening tests can find diseases early when they are easier to treat.  Your doctor or nurse will talk with you about which tests are important for you.  Getting shots for common diseases like the flu and shingles will help prevent them.  Follow Up:  Patient agrees to Care Plan and Follow-up.  Plan: The Managed Medicaid care management team will reach out to the patient again over the next 30 days. and The  Patient has been provided with contact information for the Managed Medicaid care management team and has been advised to call with any health related questions or concerns.  Date/time of next scheduled RN care management/care coordination outreach:  07/21/21 at 0900.

## 2021-06-23 NOTE — Patient Instructions (Signed)
Hi Valerie Huber, thank you for speaking to me this afternoon, have a nice weekend.  Valerie Huber was given information about Medicaid Managed Care team care coordination services as a part of their Healthy [facility] Medicaid benefit. Valerie Huber verbally consented to engagement with the [facility] Managed Care team.   If you are experiencing a medical emergency, please call 911 or report to your local emergency department or urgent care.   If you have a non-emergency medical problem during routine business hours, please contact your provider's office and ask to speak with a nurse.   For questions related to your Healthy [facility] health plan, please call: [phone] or visit the homepage here: [email or web address]  If you would like to schedule transportation through your Healthy [facility] plan, please call the following number at least 2 days in advance of your appointment: [phone]  Call the [place] at [phone], at any time, 24 hours a day, 7 days a week. If you are in danger or need immediate medical attention call 911.  If you would like help to quit smoking, call 1-800-QUIT-NOW 616-823-8134) OR Espaol: 1-855-Djelo-Ya (8-242-353-6144) o para ms informacin haga clic aqu or Text READY to 200-400 to register via text  Valerie Huber - following are the goals we discussed in your visit today:   Goals Addressed    imeframe:  Long-Range Goal Priority:  High Start Date:       06/23/21                      Expected End Date:   ongoing                    Follow Up Date 07/21/21    - practice safe sex - schedule appointment for flu shot - schedule appointment for vaccines needed due to my age or health - schedule recommended health tests (blood work, mammogram, colonoscopy, pap test) - schedule and keep appointment for annual check-up    Why is this important?   Screening tests can find diseases early when they  are easier to treat.  Your doctor or nurse will talk with you about which tests are important for you.  Getting shots for common diseases like the flu and shingles will help prevent them.    Patient verbalizes understanding of instructions and care plan provided today and agrees to view in [place]. Active MyChart status confirmed with patient.    The Managed Medicaid care management team will reach out to the patient again over the next 30 days.  The  Patient has been provided with contact information for the Managed Medicaid care management team and has been advised to call with any health related questions or concerns.   Valerie Raider RN, BSN [place]   [facility] - Managed Medicaid High Risk [phone].   Following is a copy of your plan of care:  Care Plan : [place] of Care  Updates made by Valerie Medicus, RN since 06/23/2021 12:00 AM     Problem: Chronic Disease Management and Care Coordiantion Needs   Priority: High     Goal: Self-Management Plan Developed   Start Date: 06/23/2021  Expected End Date: 09/20/2021  This Visit's Progress: Not on track  Priority: High  Note:    Current Barriers:  Knowledge Deficits related to plan of care for management of sore throat off and on  Care Coordination needs related to sore  throat off and on  RNCM Clinical Goal(s):  Patient will verbalize understanding of plan for management of sore throat  as evidenced by contacting provider regarding appointment verbalize basic understanding of  evaluation of sore throat  disease process and self health management plan as evidenced by scheduling an appointment with provider  take all medications exactly as prescribed and will call provider for medication related questions demonstrate understanding of rationale for each prescribed medication attend all scheduled medical appointments continue to work with RN Care Manager to address care  management and care coordination needs related to  sore throat off and on as evidenced by adherence to CM Team Scheduled appointments through collaboration with RN Care manager, provider, and care team.   Interventions: Inter-disciplinary care team collaboration (see longitudinal plan of care) Evaluation of current treatment plan related to  self management and patient's adherence to plan as established by provider    (Status:  New goal.)  Long Term Goal Evaluation of current treatment plan related to  sore throat off and on  Discussed plans with patient for ongoing care management follow up and provided patient with direct contact information for care management team Evaluation of current treatment plan related to sore throat off and on  and patient's adherence to plan as established by provider Advised patient to contact provider regarding appointment Reviewed medications with patient Reviewed scheduled/upcoming provider appointments  Discussed plans with patient for ongoing care management follow up and provided patient with direct contact information for care management team Assessed social determinant of health barriers  Patient Goals/Self-Care Activities: Take all medications as prescribed Attend all scheduled provider appointments Call pharmacy for medication refills 3-7 days in advance of running out of medications Perform all self care activities independently  Perform IADL's (shopping, preparing meals, housekeeping, managing finances) independently Call provider office for new concerns or questions   Follow Up Plan:  The patient has been provided with contact information for the care management team and has been advised to call with any health related questions or concerns.  The care management team will reach out to the patient again over the next 30 days.

## 2021-06-30 DIAGNOSIS — F4310 Post-traumatic stress disorder, unspecified: Secondary | ICD-10-CM | POA: Diagnosis not present

## 2021-06-30 DIAGNOSIS — F411 Generalized anxiety disorder: Secondary | ICD-10-CM | POA: Diagnosis not present

## 2021-07-02 ENCOUNTER — Other Ambulatory Visit: Payer: Self-pay

## 2021-07-02 ENCOUNTER — Encounter (HOSPITAL_COMMUNITY): Payer: Self-pay

## 2021-07-02 ENCOUNTER — Emergency Department (HOSPITAL_COMMUNITY)
Admission: EM | Admit: 2021-07-02 | Discharge: 2021-07-02 | Disposition: A | Discharge status: Home / Self Care | Payer: Medicaid Other | Attending: Emergency Medicine | Admitting: Emergency Medicine

## 2021-07-02 ENCOUNTER — Emergency Department (HOSPITAL_COMMUNITY): Payer: Medicaid Other

## 2021-07-02 DIAGNOSIS — S3992XA Unspecified injury of lower back, initial encounter: Secondary | ICD-10-CM | POA: Diagnosis not present

## 2021-07-02 DIAGNOSIS — R200 Anesthesia of skin: Secondary | ICD-10-CM | POA: Diagnosis present

## 2021-07-02 DIAGNOSIS — J45909 Unspecified asthma, uncomplicated: Secondary | ICD-10-CM | POA: Diagnosis not present

## 2021-07-02 DIAGNOSIS — M5416 Radiculopathy, lumbar region: Secondary | ICD-10-CM

## 2021-07-02 DIAGNOSIS — R202 Paresthesia of skin: Secondary | ICD-10-CM | POA: Diagnosis not present

## 2021-07-02 DIAGNOSIS — M5116 Intervertebral disc disorders with radiculopathy, lumbar region: Secondary | ICD-10-CM | POA: Insufficient documentation

## 2021-07-02 DIAGNOSIS — M5126 Other intervertebral disc displacement, lumbar region: Secondary | ICD-10-CM | POA: Diagnosis not present

## 2021-07-02 DIAGNOSIS — M5127 Other intervertebral disc displacement, lumbosacral region: Secondary | ICD-10-CM | POA: Diagnosis not present

## 2021-07-02 MED ORDER — PREDNISONE 50 MG PO TABS
60.0000 mg | ORAL_TABLET | Freq: Once | ORAL | Status: AC
  Administered 2021-07-02: 60 mg via ORAL
  Filled 2021-07-02: qty 1

## 2021-07-02 MED ORDER — PREDNISONE 10 MG PO TABS: ORAL_TABLET | ORAL | 0 refills | Status: DC

## 2021-07-02 NOTE — ED Provider Notes (Signed)
[facility] EMERGENCY DEPARTMENT Provider Note   CSN: [id number] Arrival date & time: 07/02/21  1734     History  Chief Complaint  Patient presents with   Leg Pain    Right, numbness    Valerie Huber is a 36 y.o. female with a history including asthma, chronic low back pain from a traumatic fall down steps 8 years ago, distant history of seizure presenting for evaluation of numbness in her right foot which has been present for the past month.  She describes initially having intermittent numbness and tingling in the foot mostly located to the dorsal foot but also describing sensation of stepping on an object when she puts weight on this extremity, over the past 9 days the right dorsal foot has been completely numb causing difficulty with walking due to this decree sensation.  She denies fevers or chills, denies new injury, also denies urinary or fecal incontinence or retention, although she does endorse urge incontinence occasionally since her last pregnancy.  She takes Tylenol on a daily basis for her low back pain.  She has found no alleviators for her foot numbness except for rest.   The history is provided by the patient.      Home Medications Prior to Admission medications   Medication Sig Start Date End Date Taking? Authorizing Provider  predniSONE (DELTASONE) 10 MG tablet 6, 5, 4, 3, 2 then 1 tablet by mouth daily for 6 days total. 07/02/21  Yes Veer Elamin, Almyra Free, PA-C  acetaminophen (TYLENOL) 500 MG tablet Take 500-1,500 mg by mouth every 6 (six) hours as needed for moderate pain.    [provider]  amoxicillin-clavulanate (AUGMENTIN) 875-125 MG tablet Take 1 tablet by mouth every 12 (twelve) hours. Patient not taking: Reported on 06/23/2021 05/04/21   Volney American, PA-C  ketoconazole (NIZORAL) 2 % shampoo Apply 1 application topically 2 (two) times a week. 05/04/21   Volney American, PA-C      Allergies    Flexeril [cyclobenzaprine] and Ibuprofen     Review of Systems   Review of Systems  Constitutional:  Negative for chills and fever.  HENT:  Negative for congestion and sore throat.   Eyes: Negative.   Respiratory:  Negative for chest tightness and shortness of breath.   Cardiovascular:  Negative for chest pain.  Gastrointestinal:  Negative for abdominal pain and nausea.  Genitourinary: Negative.   Musculoskeletal:  Positive for back pain. Negative for arthralgias, joint swelling and neck pain.  Skin: Negative.  Negative for rash and wound.  Neurological:  Positive for numbness. Negative for dizziness, weakness, light-headedness and headaches.  Psychiatric/Behavioral: Negative.    All other systems reviewed and are negative.  Physical Exam Updated Vital Signs BP (!) 129/95 (BP Location: Right Arm)    Pulse 87    Temp 98.7 F (37.1 C) (Oral)    Resp 16    Ht 5\' 4"  (1.626 m)    Wt 82.4 kg    LMP 12/08/2020    SpO2 99%    BMI 31.18 kg/m  Physical Exam Vitals and nursing note reviewed.  Constitutional:      Appearance: She is well-developed.  HENT:     Head: Normocephalic and atraumatic.  Eyes:     Conjunctiva/sclera: Conjunctivae normal.  Cardiovascular:     Rate and Rhythm: Normal rate and regular rhythm.     Pulses: Normal pulses.          Dorsalis pedis pulses are 2+ on the right side and  2+ on the left side.     Heart sounds: Normal heart sounds.  Pulmonary:     Effort: Pulmonary effort is normal.     Breath sounds: Normal breath sounds. No wheezing.  Abdominal:     General: Bowel sounds are normal.     Palpations: Abdomen is soft.     Tenderness: There is no abdominal tenderness.  Musculoskeletal:        General: Normal range of motion.     Cervical back: Normal range of motion.  Skin:    General: Skin is warm and dry.  Neurological:     Mental Status: She is alert and oriented to person, place, and time.     Sensory: Sensory deficit present.     Comments: Decree sensation to fine touch in the S1  distribution of her right foot.    ED Results / Procedures / Treatments   Labs (all labs ordered are listed, but only abnormal results are displayed) Labs Reviewed - No data to display  EKG None  Radiology CT Lumbar Spine Wo Contrast  Result Date: 07/02/2021 CLINICAL DATA:  Initial evaluation for acute trauma, radiculopathy. EXAM: CT LUMBAR SPINE WITHOUT CONTRAST TECHNIQUE: Multidetector CT imaging of the lumbar spine was performed without intravenous contrast administration. Multiplanar CT image reconstructions were also generated. RADIATION DOSE REDUCTION: This exam was performed according to the departmental dose-optimization program which includes automated exposure control, adjustment of the mA and/or kV according to patient size and/or use of iterative reconstruction technique. COMPARISON:  Prior study from 11/26/2017. FINDINGS: Segmentation: Standard. Lowest well-formed disc space labeled the L5-S1 level. Alignment: Physiologic with preservation of the normal lumbar lordosis. No listhesis. Vertebrae: Vertebral body height maintained without acute or chronic fracture. Visualized sacrum and pelvis intact. No discrete or worrisome osseous lesions. Paraspinal and other soft tissues: Paraspinous soft tissues demonstrate no acute finding. Probable punctate nonobstructive left renal nephrolithiasis. Disc levels: L1-2:  Unremarkable. L2-3:  Unremarkable. L3-4: Small left foraminal to extraforaminal disc protrusion closely approximates the exiting left L3 nerve root (series 4, image 61). No spinal stenosis. Foramina remain patent. L4-5: Mild disc bulge. No spinal stenosis. Foramina remain patent. L5-S1: Shallow broad-based central disc protrusion, eccentric to the right. Protruding disc closely approximates the descending S1 nerve roots, either which could be affected. Mild facet hypertrophy. No significant spinal stenosis. Foramina remain patent. IMPRESSION: 1. No acute traumatic injury within the  lumbar spine. 2. Shallow broad-based central disc protrusion at L5-S1, closely approximating and potentially affecting either of the descending S1 nerve roots. 3. Small left foraminal to extraforaminal disc protrusion at L3-4, closely approximates the exiting left L3 nerve root. 4. Probable punctate nonobstructive left renal nephrolithiasis. Electronically Signed   By: Jeannine Boga M.D.   On: 07/02/2021 22:48    Procedures Procedures    Medications Ordered in ED Medications  predniSONE (DELTASONE) tablet 60 mg (60 mg Oral Given 07/02/21 2351)    ED Course/ Medical Decision Making/ A&P                           Medical Decision Making Patient with chronic low back pain with new paresthesia in her right dorsal foot.  She has no weakness in her extremity, no symptoms suggesting cauda equina.  Afebrile.  Amount and/or Complexity of Data Reviewed Radiology: ordered.    Details: CT of L-spine obtained, she does have a disc protrusion at L5-S1, potentially affecting the descending S1 nerve root which does  explain numbness in her right foot.  Risk Prescription drug management. Decision regarding hospitalization. Risk Details: Patient to not have an emergent need for admission at this time, however she will need close follow-up with neurosurgery.  She was referred to Dr. Venetia Constable and she will call tomorrow morning for an office visit this week.  In the interim she was started on a prednisone taper.  We discussed strict return precautions which would be red flags for emergent neurosurgery consultation, ideally presenting at [facility] if her symptoms worsen.  Patient understands plan and need for close follow-up.           Final Clinical Impression(s) / ED Diagnoses Final diagnoses:  Acute right lumbar radiculopathy  Numbness and tingling of foot    Rx / DC Orders ED Discharge Orders          Ordered    predniSONE (DELTASONE) 10 MG tablet        07/02/21 2338               Evalee Jefferson, PA-C 07/03/21 0009    Milton Ferguson, MD 07/04/21 1747

## 2021-07-02 NOTE — ED Notes (Signed)
Pt going to ct  

## 2021-07-02 NOTE — Discharge Instructions (Signed)
Your CT scan confirms that your foot numbness and tingling is most likely due to a nerve problem in your lower back.  You will need further evaluation by neurosurgery and have been referred to Dr. Venetia Constable for this.  Please call his office tomorrow morning for an appointment this week.  In the interim I want you to take the prednisone prescribed to help reduce inflammation around this nerve.  If you develop worsening symptoms such as weakness in your foot or leg or any worsening other symptoms such as urinary incontinence or inability to empty your bladder, these are potential surgical emergencies and I would recommend you going to [facility] if you develop any of these symptoms as neurosurgery would be able to directly evaluate you there if needed.

## 2021-07-02 NOTE — ED Triage Notes (Signed)
Patient arrives from home reporting intermittent pain and numbness in right leg for a month. For the last 9 days, she says its completely numb with trouble walking. Patient reports no swelling. PCP is concerned for a blood cot in right lower leg.

## 2021-07-04 ENCOUNTER — Telehealth: Payer: Self-pay | Admitting: *Deleted

## 2021-07-04 NOTE — Telephone Encounter (Signed)
Transition Care Management Follow-up Telephone Call Date of discharge and from where: 07/02/2021 - [name] How have you been since you were released from the hospital? "My foot is still a little numb" Any questions or concerns? No  Items Reviewed: Did the pt receive and understand the discharge instructions provided? Yes  Medications obtained and verified? Yes  Other? No  Any new allergies since your discharge? No  Dietary orders reviewed? No Do you have support at home? Yes    Functional Questionnaire: (I = Independent and D = Dependent) ADLs: I  Bathing/Dressing- I  Meal Prep- I  Eating- I  Maintaining continence- I  Transferring/Ambulation- I  Managing Meds- I  Follow up appointments reviewed:  PCP Hospital f/u appt confirmed? No  - has routine appointment in April Specialist Hospital f/u appt confirmed? No   Are transportation arrangements needed? No  If their condition worsens, is the pt aware to call PCP or go to the Emergency Dept.? Yes Was the patient provided with contact information for the PCP's office or ED? Yes Was to pt encouraged to call back with questions or concerns? Yes

## 2021-07-21 ENCOUNTER — Other Ambulatory Visit: Payer: Self-pay | Admitting: Obstetrics and Gynecology

## 2021-07-21 DIAGNOSIS — F4310 Post-traumatic stress disorder, unspecified: Secondary | ICD-10-CM | POA: Diagnosis not present

## 2021-07-21 DIAGNOSIS — F411 Generalized anxiety disorder: Secondary | ICD-10-CM | POA: Diagnosis not present

## 2021-07-21 NOTE — Patient Instructions (Signed)
Hi Ms. Jhaveri, I am sorry I missed you today- as a part of your Medicaid benefit, you are eligible for care management and care coordination services at no cost or copay. I was unable to reach you by phone today but would be happy to help you with your health related needs. Please feel free to call me at [phone] ? ?A member of the Managed Medicaid care management team will reach out to you again over the next 7-14 business days.  ? ?Aida Raider RN, BSN ?[facility] ?Care Management Coordinator - Managed Medicaid High Risk ?[phone] ?  ?

## 2021-07-21 NOTE — Patient Outreach (Signed)
Care Coordination ? ?07/21/2021 ? ?Hoyle Sauer Goodgame ?Sep 05, 1985 ?[id number] ? ? ?Medicaid Managed Care  ? ?Unsuccessful Outreach Note ? ?07/21/2021 ?Name: Isobelle Tuckett MRN: [id number] DOB: Sep 17, 1985 ? ?Referred by: Lindell Spar, MD ?Reason for referral : High Risk Managed Medicaid (Unsuccessful telephone outreach) ? ? ?An unsuccessful telephone outreach was attempted today. The patient was referred to the case management team for assistance with care management and care coordination.  ? ?Follow Up Plan: The care management team will reach out to the patient again over the next 7-14 business days.  ? ?Aida Raider RN, BSN ?[facility] ?Care Management Coordinator - Managed Medicaid High Risk ?[phone] ?  ? ? ?

## 2021-07-26 ENCOUNTER — Other Ambulatory Visit: Payer: Self-pay | Admitting: Otolaryngology

## 2021-07-27 ENCOUNTER — Other Ambulatory Visit: Payer: Self-pay | Admitting: Otolaryngology

## 2021-07-28 DIAGNOSIS — F4310 Post-traumatic stress disorder, unspecified: Secondary | ICD-10-CM | POA: Diagnosis not present

## 2021-07-28 DIAGNOSIS — F411 Generalized anxiety disorder: Secondary | ICD-10-CM | POA: Diagnosis not present

## 2021-08-02 ENCOUNTER — Other Ambulatory Visit: Payer: Medicaid Other

## 2021-08-03 ENCOUNTER — Other Ambulatory Visit: Payer: Self-pay

## 2021-08-03 ENCOUNTER — Ambulatory Visit
Admission: RE | Admit: 2021-08-03 | Discharge: 2021-08-03 | Disposition: A | Discharge status: Home / Self Care | Payer: Medicaid Other | Source: Ambulatory Visit | Attending: Otolaryngology | Admitting: Otolaryngology

## 2021-08-03 DIAGNOSIS — R1310 Dysphagia, unspecified: Secondary | ICD-10-CM | POA: Diagnosis not present

## 2021-08-03 DIAGNOSIS — E049 Nontoxic goiter, unspecified: Secondary | ICD-10-CM | POA: Diagnosis not present

## 2021-08-04 DIAGNOSIS — F411 Generalized anxiety disorder: Secondary | ICD-10-CM | POA: Diagnosis not present

## 2021-08-04 DIAGNOSIS — F4310 Post-traumatic stress disorder, unspecified: Secondary | ICD-10-CM | POA: Diagnosis not present

## 2021-08-08 ENCOUNTER — Other Ambulatory Visit: Payer: Self-pay | Admitting: Obstetrics and Gynecology

## 2021-08-08 ENCOUNTER — Other Ambulatory Visit: Payer: Self-pay

## 2021-08-08 NOTE — Patient Outreach (Signed)
Medicaid Managed Care   Nurse Care Manager Note  08/08/2021 Name:  Valerie Huber MRN:  [id number] DOB:  16-Oct-1985  Valerie Huber is an 36 y.o. year old female who is a primary patient of [name], MD.  The [facility] Managed Care Coordination team was consulted for assistance with:    Healthcare management needs, anxiety/depression/OCD, right foot numbness, blurry vision, abdominal pain  Ms. Lookabill was given information about Medicaid Managed Care Coordination team services today. [name] Patient agreed to services and verbal consent obtained.  Engaged with patient by telephone for follow up visit in response to provider referral for case management and/or care coordination services.   Assessments/Interventions:  Review of past medical history, allergies, medications, health status, including review of consultants reports, laboratory and other test data, was performed as part of comprehensive evaluation and provision of chronic care management services.  SDOH (Social Determinants of Health) assessments and interventions performed: SDOH Interventions    Flowsheet Row Most Recent Value  SDOH Interventions   Intimate Partner Violence Interventions Intervention Not Indicated  Physical Activity Interventions Patient Refused     Care Plan  Allergies  Allergen Reactions   Flexeril [Cyclobenzaprine] Hives   Ibuprofen Anaphylaxis    Throat, eyes, lips swollen   Medications Reviewed Today     Reviewed by [name], RN (Registered Nurse) on 08/08/21 at 1309  Med List Status: <None>   Medication Order Taking? Sig Documenting Provider Last Dose Status Informant  acetaminophen (TYLENOL) 500 MG tablet [id number]  Take 500-1,500 mg by mouth every 6 (six) hours as needed for moderate pain. [provider]  Active Self  amoxicillin-clavulanate (AUGMENTIN) 875-125 MG tablet [id number]  Take 1 tablet by mouth every 12 (twelve) hours.  Patient not taking: Reported  on 06/23/2021   Particia Nearing, PA-C  Active   ketoconazole (NIZORAL) 2 % shampoo [id number]  Apply 1 application topically 2 (two) times a week. Particia Nearing, PA-C  Active   predniSONE (DELTASONE) 10 MG tablet [id number] No 6, 5, 4, 3, 2 then 1 tablet by mouth daily for 6 days total.  Patient not taking: Reported on 08/08/2021   [name], PA-C Not Taking Active            Patient Active Problem List   Diagnosis Date Noted   DUB (dysfunctional uterine bleeding)    Dysmenorrhea    Dyspareunia, female    History of endometriosis    Chest pain 11/07/2020   Seasonal allergic rhinitis due to pollen 09/01/2020   Encounter for examination following treatment at hospital 04/12/2020   Heme positive stool 06/30/2019   Chronic diarrhea 06/30/2019   Rectal bleeding    Complex regional pain syndrome type 1 of right lower extremity 05/20/2017   Depression 11/30/2015   H/O: substance abuse (HCC) 11/30/2015   History of seizures 11/30/2015   Marijuana use 07/05/2015   Moderate dysplasia of cervix (CIN II)  at endocervical margin of LEEP specimen 01/22/2015   Pelvic pain in female 12/31/2014   Endometriosis determined by laparoscopy 12/31/2014   Second degree uterine prolapse 12/31/2014   Irregular periods 12/31/2014   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/21/2014   Hematemesis with nausea    Anaphylaxis 09/04/2011   Conditions to be addressed/monitored per PCP order:  Healthcare management needs, anxiety/depression/OCD, right foot numbness, blurry vision, abdominal pain  Care Plan : RN Care Manager Plan of Care  Updates made by [name], RN since 08/08/2021 12:00 AM  Problem: Chronic Disease Management and Care Coordiantion Needs   Priority: High     Goal: Self-Management Plan Developed   Start Date: 06/23/2021  Expected End Date: 09/20/2021  Recent Progress: Not on track  Priority: High  Note:    Current Barriers:  Knowledge Deficits related to plan  of care for management of right foot numbness and blurry vision Care Coordination needs related to right foot numbness and blurry vision.  RNCM Clinical Goal(s):  Patient will verbalize understanding of plan for management of right foot numbness and blurry vision as evidenced by contacting provider regarding appointment verbalize basic understanding of  evaluation of  right foot numbness,  blurry vision and self health management plan as evidenced by scheduling an appointment with provider  take all medications exactly as prescribed and will call provider for medication related questions demonstrate understanding of rationale for each prescribed medication attend all scheduled medical appointments continue to work with RN Care Manager to address care management and care coordination needs related right foot numbness and blurry vision  as evidenced by adherence to CM Team Scheduled appointments through collaboration with RN Care manager, provider, and care team.   Interventions: Inter-disciplinary care team collaboration (see longitudinal plan of care) Evaluation of current treatment plan related to  self management and patient's adherence to plan as established by provider    (Status:  New goal.)  Long Term Goal Evaluation of current treatment plan related to right foot numbness and blurry vision Discussed plans with patient for ongoing care management follow up and provided patient with direct contact information for care management team Evaluation of current treatment plan related to right foot numbness, blurry vision  and patient's adherence to plan as established by provider Advised patient to contact provider regarding appointment Reviewed medications with patient Reviewed scheduled/upcoming provider appointments  Discussed plans with patient for ongoing care management follow up and provided patient with direct contact information for care management team Assessed social determinant of  health barriers Patient provided with phone number for Dr. [name] to schedule an appt for right foot numbness.  Also discussed scheduling an appt with eye provider for evaluation of blurry vision-patient states she has an eye provider she can see in [place].  Patient Goals/Self-Care Activities: Take all medications as prescribed Attend all scheduled provider appointments Call pharmacy for medication refills 3-7 days in advance of running out of medications Perform all self care activities independently  Perform IADL's (shopping, preparing meals, housekeeping, managing finances) independently Call provider office for new concerns or questions   Follow Up Plan:  The patient has been provided with contact information for the care management team and has been advised to call with any health related questions or concerns.  The care management team will reach out to the patient again over the next 30 days.    Long-Range Goal: Establish Plan of Care  for Chronic Disease Management Needs   Start Date: 06/23/2021  Expected End Date: 09/20/2021  Priority: High  Note:   Timeframe:  Long-Range Goal Priority:  High Start Date:       06/23/21                      Expected End Date:   ongoing                    Follow Up Date: 09/08/21   - practice safe sex - schedule appointment for flu shot - schedule appointment for vaccines needed due  to my age or health - schedule recommended health tests (blood work, mammogram, colonoscopy, pap test) - schedule and keep appointment for annual check-up    Why is this important?   Screening tests can find diseases early when they are easier to treat.  Your doctor or nurse will talk with you about which tests are important for you.  Getting shots for common diseases like the flu and shingles will help prevent them.    08/08/21:  patient recently seen and evaluated by ENT, has upcoming appt with PCP.  To schedule. an appt with Dr. [name].   Follow Up:   Patient agrees to Care Plan and Follow-up.  Plan: The Managed Medicaid care management team will reach out to the patient again over the next 30 days. and The  Patient has been provided with contact information for the Managed Medicaid care management team and has been advised to call with any health related questions or concerns.  Date/time of next scheduled RN care management/care coordination outreach:  09/13/21 at 330.

## 2021-08-08 NOTE — Patient Instructions (Signed)
Hi Ms. Gitto, thank you for speaking with me today-have a good afternoon!! ? ?Ms. Nass was given information about Medicaid Managed Care team care coordination services as a part of their Healthy [facility] Medicaid benefit. Vannie Hilgert verbally consented to engagement with the [facility] Managed Care team.  ? ?If you are experiencing a medical emergency, please call 911 or report to your local emergency department or urgent care.  ? ?If you have a non-emergency medical problem during routine business hours, please contact your provider's office and ask to speak with a nurse.  ? ?For questions related to your Healthy [facility] health plan, please call: [phone] or visit the homepage here: [email or web address] ? ?If you would like to schedule transportation through your Healthy [facility] plan, please call the following number at least 2 days in advance of your appointment: [phone] ? For information about your ride after you set it up, call Ride Assist at [phone]. Use this number to activate a Will Call pickup, or if your transportation is late for a scheduled pickup. Use this number, too, if you need to make a change or cancel a previously scheduled reservation. ? If you need transportation services right away, call [phone]. The after-hours call center is staffed 24 hours to handle ride assistance and urgent reservation requests (including discharges) 365 days a year. Urgent trips include sick visits, hospital discharge requests and life-sustaining treatment. ? ?Call the [place] at [phone], at any time, 24 hours a day, 7 days a week. If you are in danger or need immediate medical attention call 911. ? ?If you would like help to quit smoking, call 1-800-QUIT-NOW 504 740 4362) OR Espa?ol: 1-855-D?jelo-Ya 517-115-9221) o para m?s informaci?n haga clic aqu? or Text READY to 200-400 to register via text ? ?Ms. Bergeman -  following are the goals we discussed in your visit today:  ? Goals Addressed   ? ?Long-Range Goal: Establish Plan of Care  for Chronic Disease Management Needs   ?Start Date: 06/23/2021  ?Expected End Date: 09/20/2021  ?Priority: High  ?Note:   ?Timeframe:  Long-Range Goal ?Priority:  High ?Start Date:       06/23/21                      ?Expected End Date:   ongoing                   ? ?Follow Up Date: 09/08/21 ?  ?- practice safe sex ?- schedule appointment for flu shot ?- schedule appointment for vaccines needed due to my age or health ?- schedule recommended health tests (blood work, mammogram, colonoscopy, pap test) ?- schedule and keep appointment for annual check-up  ?  ?Why is this important?   ?Screening tests can find diseases early when they are easier to treat.  ?Your doctor or nurse will talk with you about which tests are important for you.  ?Getting shots for common diseases like the flu and shingles will help prevent them.   ? 08/08/21:  patient recently seen and evaluated by ENT, has upcoming appt with PCP.  To schedule. an appt with Dr. Zada Finders.  ? ?Patient verbalizes understanding of instructions and care plan provided today and agrees to view in [place]. Active MyChart status confirmed with patient.   ? ?The Managed Medicaid care management team will reach out to the patient again over the next 30 days.  ?The  Patient has been provided with contact information for the Managed Medicaid  care management team and has been advised to call with any health related questions or concerns.  ? ?Aida Raider RN, BSN ?[facility] ?Care Management Coordinator - Managed Medicaid High Risk ?[phone] ?  ?Following is a copy of your plan of care:  ?Care Plan : [place] of Care  ?Updates made by Gayla Medicus, RN since 08/08/2021 12:00 AM  ?  ? ?Problem: Chronic Disease Management and Care Coordiantion Needs   ?Priority: High  ?  ? ?Goal: Self-Management Plan Developed    ?Start Date: 06/23/2021  ?Expected End Date: 09/20/2021  ?Recent Progress: Not on track  ?Priority: High  ?Note:   ? ?Current Barriers:  ?Knowledge Deficits related to plan of care for management of right foot numbness and blurry vision ?Care Coordination needs related to right foot numbness and blurry vision. ? ?RNCM Clinical Goal(s):  ?Patient will verbalize understanding of plan for management of right foot numbness and blurry vision as evidenced by contacting provider regarding appointment ?verbalize basic understanding of  evaluation of  right foot numbness,  blurry vision and self health management plan as evidenced by scheduling an appointment with provider  ?take all medications exactly as prescribed and will call provider for medication related questions ?demonstrate understanding of rationale for each prescribed medication ?attend all scheduled medical appointments ?continue to work with RN Care Manager to address care management and care coordination needs related right foot numbness and blurry vision  as evidenced by adherence to CM Team Scheduled appointments through collaboration with RN Care manager, provider, and care team.  ? ?Interventions: ?Inter-disciplinary care team collaboration (see longitudinal plan of care) ?Evaluation of current treatment plan related to  self management and patient's adherence to plan as established by provider ? ?  (Status:  New goal.)  Long Term Goal ?Evaluation of current treatment plan related to right foot numbness and blurry vision ?Discussed plans with patient for ongoing care management follow up and provided patient with direct contact information for care management team ?Evaluation of current treatment plan related to right foot numbness, blurry vision  and patient's adherence to plan as established by provider ?Advised patient to contact provider regarding appointment ?Reviewed medications with patient ?Reviewed scheduled/upcoming provider appointments   ?Discussed plans with patient for ongoing care management follow up and provided patient with direct contact information for care management team ?Assessed social determinant of health barriers ?Patient provided with phone number for Dr. Zada Finders to schedule an appt for right foot numbness.  Also discussed scheduling an appt with eye provider for evaluation of blurry vision-patient states she has an eye provider she can see in [place]. ? ?Patient Goals/Self-Care Activities: ?Take all medications as prescribed ?Attend all scheduled provider appointments ?Call pharmacy for medication refills 3-7 days in advance of running out of medications ?Perform all self care activities independently  ?Perform IADL's (shopping, preparing meals, housekeeping, managing finances) independently ?Call provider office for new concerns or questions  ? ?Follow Up Plan:  The patient has been provided with contact information for the care management team and has been advised to call with any health related questions or concerns.  ?The care management team will reach out to the patient again over the next 30 days.   ? ? ?  ?

## 2021-08-10 ENCOUNTER — Other Ambulatory Visit: Payer: Self-pay | Admitting: Internal Medicine

## 2021-08-10 DIAGNOSIS — R102 Pelvic and perineal pain: Secondary | ICD-10-CM

## 2021-08-11 ENCOUNTER — Ambulatory Visit (INDEPENDENT_AMBULATORY_CARE_PROVIDER_SITE_OTHER): Payer: Medicaid Other

## 2021-08-11 ENCOUNTER — Ambulatory Visit
Admission: EM | Admit: 2021-08-11 | Discharge: 2021-08-11 | Disposition: A | Discharge status: Home / Self Care | Payer: Medicaid Other | Attending: Urgent Care | Admitting: Urgent Care

## 2021-08-11 DIAGNOSIS — F411 Generalized anxiety disorder: Secondary | ICD-10-CM | POA: Diagnosis not present

## 2021-08-11 DIAGNOSIS — M25571 Pain in right ankle and joints of right foot: Secondary | ICD-10-CM | POA: Diagnosis not present

## 2021-08-11 DIAGNOSIS — M19071 Primary osteoarthritis, right ankle and foot: Secondary | ICD-10-CM | POA: Diagnosis not present

## 2021-08-11 DIAGNOSIS — F4310 Post-traumatic stress disorder, unspecified: Secondary | ICD-10-CM | POA: Diagnosis not present

## 2021-08-11 DIAGNOSIS — M79671 Pain in right foot: Secondary | ICD-10-CM

## 2021-08-11 DIAGNOSIS — M7989 Other specified soft tissue disorders: Secondary | ICD-10-CM | POA: Diagnosis not present

## 2021-08-11 DIAGNOSIS — M5416 Radiculopathy, lumbar region: Secondary | ICD-10-CM | POA: Diagnosis not present

## 2021-08-11 MED ORDER — METHYLPREDNISOLONE SODIUM SUCC 125 MG IJ SOLR
125.0000 mg | Freq: Once | INTRAMUSCULAR | Status: AC
  Administered 2021-08-11: 125 mg via INTRAMUSCULAR

## 2021-08-11 MED ORDER — PREDNISONE 50 MG PO TABS: 50.0000 mg | ORAL_TABLET | Freq: Every day | ORAL | 0 refills | Status: DC

## 2021-08-11 NOTE — ED Triage Notes (Signed)
Pt reports pain and swelling in right ankle x 5 days.  Tylenol, ice compress gives no relief.  ?

## 2021-08-11 NOTE — ED Provider Notes (Addendum)
?[facility] ? ? ?MRN: [id number] DOB: 1986/02/19 ? ?Subjective:  ? ?Valerie Huber is a 36 y.o. female presenting for 5-day history of persistent and worsening severe right ankle pain, right foot pain with swelling.  Symptoms started after she spent a long period of time on her feet for one of her family members visuals.  She has a history of recurrent injuries of the right ankle.  Also has a history of complex regional pain syndrome in the right lower extremity.  Has a history of substance abuse in her medical chart. ? ?No current facility-administered medications for this encounter. ? ?Current Outpatient Medications:  ?  acetaminophen (TYLENOL) 500 MG tablet, Take 500-1,500 mg by mouth every 6 (six) hours as needed for moderate pain., Disp: , Rfl:  ?  amoxicillin-clavulanate (AUGMENTIN) 875-125 MG tablet, Take 1 tablet by mouth every 12 (twelve) hours. (Patient not taking: Reported on 06/23/2021), Disp: 14 tablet, Rfl: 0 ?  DULoxetine (CYMBALTA) 30 MG capsule, TAKE 1 CAPSULE(30 MG) BY MOUTH DAILY, Disp: 30 capsule, Rfl: 0 ?  ketoconazole (NIZORAL) 2 % shampoo, Apply 1 application topically 2 (two) times a week., Disp: 120 mL, Rfl: 0 ?  predniSONE (DELTASONE) 10 MG tablet, 6, 5, 4, 3, 2 then 1 tablet by mouth daily for 6 days total. (Patient not taking: Reported on 08/08/2021), Disp: 21 tablet, Rfl: 0  ? ?Allergies  ?Allergen Reactions  ? Flexeril [Cyclobenzaprine] Hives  ? Ibuprofen Anaphylaxis  ?  Throat, eyes, lips swollen  ? ? ?Past Medical History:  ?Diagnosis Date  ? Asthma   ? 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 Endometriosis   ? Irregular periods 12/31/2014  ? Pelvic pain in female 12/31/2014  ? Pregnant 04/14/2015  ? Second degree uterine prolapse 12/31/2014  ? Seizures ([place])   ? Vaginal Pap smear, abnormal   ?  ? ?Past Surgical History:  ?Procedure Laterality Date  ? BIOPSY  07/06/2019  ? Procedure: BIOPSY;  Surgeon: Daneil Dolin, MD;  Location[facility];  Service: Endoscopy;;  colon ?   ? COLONOSCOPY WITH PROPOFOL N/A 07/06/2019  ? one 3 mm polyp hyperplastic, segmental biopsies negative  ? DILITATION & CURRETTAGE/HYSTROSCOPY WITH NOVASURE ABLATION N/A 10/08/2017  ? Procedure: DILATATION & CURETTAGE/HYSTEROSCOPY WITH MINERVA  ENDOMETRIAL ABLATION (Procedure #2);  Surgeon: [name], MD;  Location: [facility] ORS;  Service: Gynecology;  Laterality: N/A;  ? ESOPHAGOGASTRODUODENOSCOPY N/A 12/10/2014  ? SLF: hematoemesis due to moderate erosive gastritis, duoedentitis, and duodenal ulcer  ? LEEP    ? POLYPECTOMY  07/06/2019  ? Procedure: POLYPECTOMY;  Surgeon: Daneil Dolin, MD;  Location: [facility];  Service: Endoscopy;;  ? surgery for endometriosis  2005  ? TUBAL LIGATION Bilateral 10/08/2017  ? Procedure: BILATERAL TUBAL STERILIZATION WITH FALLOPE RINGS APPLICATION (Procedure #1);  Surgeon: [name], MD;  Location: [facility] OR[facility];  Service: Gynecology;  Laterality: Bilateral;  ? VAGINAL HYSTERECTOMY N/A 12/14/2020  ? Procedure: TOTAL HYSTERECTOMY VAGINAL;  Surgeon: Florian Buff, MD;  Location: [facility];  Service: Gynecology;  Laterality: N/A;  ? ? ?Family History  ?Problem Relation Age of Onset  ? Cancer Mother   ?     kidney   ? Asthma Mother   ? Hypertension Mother   ? Colon polyps Sister   ?     18, large polyp, [facility]   ? Cancer Paternal Grandmother   ? Asthma Son   ? Colon cancer Neg Hx   ? ? ?Social History  ? ?  Tobacco Use  ? Smoking status: Former  ?  Packs/day: 0.25  ?  Years: 14.00  ?  Pack years: 3.50  ?  Types: Cigarettes  ? Smokeless tobacco: Never  ? Tobacco comments:  ?  quit in 2017   ?Vaping Use  ? Vaping Use: Never used  ?Substance Use Topics  ? Alcohol use: Not Currently  ?  Comment: occ  ? Drug use: Yes  ?  Types: Marijuana  ? ? ?ROS ? ? ?Objective:  ? ?Vitals: ?BP (!) 154/91 (BP Location: Right Arm)   Pulse (!) 101   Temp 99.1 ?F (37.3 ?C) (Oral)   Resp 18   LMP 12/08/2020   SpO2 94%  ? ?Physical Exam ?Constitutional:   ?   General: She is not in acute distress. ?    Appearance: Normal appearance. She is well-developed. She is not ill-appearing, toxic-appearing or diaphoretic.  ?HENT:  ?   Head: Normocephalic and atraumatic.  ?   Nose: Nose normal.  ?   Mouth/Throat:  ?   Mouth: Mucous membranes are moist.  ?Eyes:  ?   General: No scleral icterus.    ?   Right eye: No discharge.     ?   Left eye: No discharge.  ?   Extraocular Movements: Extraocular movements intact.  ?Cardiovascular:  ?   Rate and Rhythm: Normal rate.  ?Pulmonary:  ?   Effort: Pulmonary effort is normal.  ?Musculoskeletal:  ?   Right ankle: Swelling present. No deformity, ecchymosis or lacerations. Tenderness present over the lateral malleolus, ATF ligament, AITF ligament, CF ligament and posterior TF ligament. No medial malleolus, base of 5th metatarsal or proximal fibula tenderness. Decreased range of motion.  ?   Right Achilles Tendon: No tenderness or defects. Thompson's test negative.  ?   Right foot: Decreased range of motion. Normal capillary refill. Swelling, tenderness and bony tenderness present. No deformity, laceration or crepitus.  ?   Comments: Most of her tenderness is over the lateral malleolus extending into the lateral proximal aspect of the foot toward the heel.  Has 1+ swelling of the area.  ?Skin: ?   General: Skin is warm and dry.  ?Neurological:  ?   General: No focal deficit present.  ?   Mental Status: She is alert and oriented to person, place, and time.  ?Psychiatric:     ?   Mood and Affect: Mood normal.     ?   Behavior: Behavior normal.     ?   Thought Content: Thought content normal.     ?   Judgment: Judgment normal.  ? ?DG Ankle Complete Right ? ?Result Date: 08/11/2021 ?CLINICAL DATA:  Pain and swelling in the right ankle over the last 5 days. EXAM: RIGHT ANKLE - COMPLETE 3+ VIEW COMPARISON:  MRI 04/29/2017 FINDINGS: Suspected fibrous coalition of the posterior subtalar joint with resulting chronic spurring and articular space narrowing best appreciated on the lateral projection  also seen on prior imaging from 2018. Substantial cephalad spurring of the calcaneus along the posterior subtalar facet medially. The posterior spurring may restrict plantar flexion at the talonavicular joint. The malleoli appear unremarkable row. Plafond and talar dome appear intact no acute bony findings are identified. IMPRESSION: 1. 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 No acute findings. Electronically Signed   By: Van Clines M.D.   On: 08/11/2021 16:07   ? ?Assessment and Plan :  ? ?I have reviewed the PDMP  during this encounter. ? ?1. Osteoarthritis of right foot, unspecified osteoarthritis type   ?2. Acute right ankle pain   ?3. Right foot pain   ? ?Suspect inflammatory pain likely related to chronic injuries.  Radiology over read and physical exam findings consistent with arthritis flare.  IM Solu-Medrol in clinic at 125 mg.  Recommended she follow this with an oral prednisone course.  She does have an orthopedist but has not been able to follow-up with Dr. Aline Brochure in [place].  Recommended recheck with him. Counseled patient on potential for adverse effects with medications prescribed/recommended today, ER and return-to-clinic precautions discussed, patient verbalized understanding. ?  ?Jaynee Eagles, PA-C ?08/11/21 1629 ? ?

## 2021-08-14 ENCOUNTER — Other Ambulatory Visit (HOSPITAL_COMMUNITY): Payer: Self-pay | Admitting: Neurological Surgery

## 2021-08-14 DIAGNOSIS — M5416 Radiculopathy, lumbar region: Secondary | ICD-10-CM

## 2021-08-15 ENCOUNTER — Encounter: Payer: Self-pay | Admitting: Orthopaedic Surgery

## 2021-08-15 ENCOUNTER — Ambulatory Visit: Payer: Medicaid Other | Admitting: Orthopaedic Surgery

## 2021-08-15 VITALS — Ht 64.0 in | Wt 179.0 lb

## 2021-08-15 DIAGNOSIS — G8929 Other chronic pain: Secondary | ICD-10-CM | POA: Diagnosis not present

## 2021-08-15 DIAGNOSIS — M25571 Pain in right ankle and joints of right foot: Secondary | ICD-10-CM

## 2021-08-15 DIAGNOSIS — Q6689 Other  specified congenital deformities of feet: Secondary | ICD-10-CM | POA: Diagnosis not present

## 2021-08-15 MED ORDER — HYDROCODONE-ACETAMINOPHEN 5-325 MG PO TABS: 1.0000 | ORAL_TABLET | ORAL | 0 refills | Status: AC | PRN

## 2021-08-15 NOTE — Progress Notes (Signed)
? ?Subjective:  ? ? Patient ID: Valerie Huber, female    DOB: [birth year], 36 y.o.   MRN: [id number] ? ?HPI ?My right ankle is hurting me bad. ? ?She presented to the Urgent Care in [place] with right ankle pain for five days on 08-11-21.  She had swelling and pain of the right ankle.  She was given prednisone, crutches and told to see orthopaedics.  X-rays showed: ?IMPRESSION: ?1. 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 No acute findings. ? ?She continues to have pain.  She has completed the prednisone with no help.  She has lateral swelling and pain.  She uses crutches.   ? ?In review of her records I see where she has been seen by Dr. Sharol Given in 2019 with the same problem and also complex regional pain syndrome of the right foot.  She saw Dr. Aline Brochure after that the same year and he referred her back to Dr. Sharol Given.  She has not seen him since then. ? ?She denies any recent trauma. ? ?She is seeing neurosurgery for a back problem with right sided paresthesias and has a MRI scheduled soon. ? ? ? ? ?Review of Systems  ?Constitutional:  Positive for activity change.  ?Respiratory:  Positive for shortness of breath.   ?Musculoskeletal:  Positive for arthralgias, gait problem, joint swelling and myalgias.  ?All other systems reviewed and are negative. ?For Review of Systems, all other systems reviewed and are negative. ? ?The following is a summary of the past history medically, past history surgically, known current medicines, social history and family history.  This information is gathered electronically by the computer from prior information and documentation.  I review this each visit and have found including this information at this point in the chart is beneficial and informative.  ? ?Past Medical History:  ?Diagnosis Date  ? Asthma   ? 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 Endometriosis   ? Irregular periods 12/31/2014  ? Pelvic pain in female  12/31/2014  ? Pregnant 04/14/2015  ? Second degree uterine prolapse 12/31/2014  ? Seizures ([place])   ? Vaginal Pap smear, abnormal   ? ? ?Past Surgical History:  ?Procedure Laterality Date  ? BIOPSY  07/06/2019  ? Procedure: BIOPSY;  Surgeon: Daneil Dolin, MD;  [facility];  Service: Endoscopy;;  colon ?  ? COLONOSCOPY WITH PROPOFOL N/A 07/06/2019  ? one 3 mm polyp hyperplastic, segmental biopsies negative  ? DILITATION & CURRETTAGE/HYSTROSCOPY WITH NOVASURE ABLATION N/A 10/08/2017  ? Procedure: DILATATION & CURETTAGE/HYSTEROSCOPY WITH MINERVA  ENDOMETRIAL ABLATION (Procedure #2);  Surgeon: [name], MD;  Location: [facility];  Service: Gynecology;  Laterality: N/A;  ? ESOPHAGOGASTRODUODENOSCOPY N/A 12/10/2014  ? SLF: hematoemesis due to moderate erosive gastritis, duoedentitis, and duodenal ulcer  ? LEEP    ? POLYPECTOMY  07/06/2019  ? Procedure: POLYPECTOMY;  Surgeon: Daneil Dolin, MD;  [facility];  Service: Endoscopy;;  ? surgery for endometriosis  2005  ? TUBAL LIGATION Bilateral 10/08/2017  ? Procedure: BILATERAL TUBAL STERILIZATION WITH FALLOPE RINGS APPLICATION (Procedure #1);  Surgeon: [name], MD;  Location: [facility];  Service: Gynecology;  Laterality: Bilateral;  ? VAGINAL HYSTERECTOMY N/A 12/14/2020  ? Procedure: TOTAL HYSTERECTOMY VAGINAL;  Surgeon: Florian Buff, MD;  Location: [facility];  Service: Gynecology;  Laterality: N/A;  ? ? ?Current Outpatient Medications on File Prior to Visit  ?Medication Sig Dispense Refill  ? acetaminophen (TYLENOL) 500 MG tablet Take  500-1,500 mg by mouth every 6 (six) hours as needed for moderate pain.    ? DULoxetine (CYMBALTA) 30 MG capsule TAKE 1 CAPSULE(30 MG) BY MOUTH DAILY 30 capsule 0  ? predniSONE (DELTASONE) 50 MG tablet Take 1 tablet (50 mg total) by mouth daily with breakfast. (Patient not taking: Reported on 08/15/2021) 5 tablet 0  ? ?No current facility-administered medications on file prior to visit.  ? ? ?Social History   ? ?Socioeconomic History  ? Marital status: Single  ?  Spouse name: Not on file  ? Number of children: Not on file  ? Years of education: Not on file  ? Highest education level: Not on file  ?Occupational History  ? Occupation: Housecleaning  ?Tobacco Use  ? Smoking status: Former  ?  Packs/day: 0.25  ?  Years: 14.00  ?  Pack years: 3.50  ?  Types: Cigarettes  ? Smokeless tobacco: Never  ? Tobacco comments:  ?  quit in 2017   ?Vaping Use  ? Vaping Use: Never used  ?Substance and Sexual Activity  ? Alcohol use: Not Currently  ?  Comment: occ  ? Drug use: Yes  ?  Types: Marijuana  ? Sexual activity: Yes  ?  Birth control/protection: Surgical  ?  Comment: tubal and ablation  ?Other Topics Concern  ? Not on file  ?Social History Narrative  ? Not on file  ? ?Social Determinants of Health  ? ?Financial Resource Strain: Not on file  ?Food Insecurity: No Food Insecurity  ? Worried About Charity fundraiser in the Last Year: Never true  ? Ran Out of Food in the Last Year: Never true  ?Transportation Needs: No Transportation Needs  ? Lack of Transportation (Medical): No  ? Lack of Transportation (Non-Medical): No  ?Physical Activity: Inactive  ? Days of Exercise per Week: 0 days  ? Minutes of Exercise per Session: 0 min  ?Stress: Not on file  ?Social Connections: Not on file  ?Intimate Partner Violence: Not At Risk  ? Fear of Current or Ex-Partner: No  ? Emotionally Abused: No  ? Physically Abused: No  ? Sexually Abused: No  ? ? ?Family History  ?Problem Relation Age of Onset  ? Cancer Mother   ?     kidney   ? Asthma Mother   ? Hypertension Mother   ? Colon polyps Sister   ?     5, large polyp, done at [facility]   ? Cancer Paternal Grandmother   ? Asthma Son   ? Colon cancer Neg Hx   ? ? ?Ht '5\' 4"'$  (1.626 m)   Wt 179 lb (81.2 kg)   LMP 12/08/2020   BMI 30.73 kg/m?  ? ?Body mass index is 30.73 kg/m?. ? ?   ?Objective:  ? Physical Exam ?Vitals and nursing note reviewed. Exam conducted with a chaperone present.  ?Constitutional:    ?   Appearance: She is well-developed.  ?HENT:  ?   Head: Normocephalic and atraumatic.  ?Eyes:  ?   Conjunctiva/sclera: Conjunctivae normal.  ?   Pupils: Pupils are equal, round, and reactive to light.  ?Cardiovascular:  ?   Rate and Rhythm: Normal rate and regular rhythm.  ?Pulmonary:  ?   Effort: Pulmonary effort is normal.  ?Abdominal:  ?   Palpations: Abdomen is soft.  ?Musculoskeletal:  ?   Cervical back: Normal range of motion and neck supple.  ?     Feet: ? ?Skin: ?   General: Skin  is warm and dry.  ?Neurological:  ?   Mental Status: She is alert and oriented to person, place, and time.  ?   Cranial Nerves: No cranial nerve deficit.  ?   Motor: No abnormal muscle tone.  ?   Coordination: Coordination normal.  ?   Deep Tendon Reflexes: Reflexes are normal and symmetric. Reflexes normal.  ?Psychiatric:     ?   Behavior: Behavior normal.     ?   Thought Content: Thought content normal.     ?   Judgment: Judgment normal.  ?I have independently reviewed and interpreted x-rays of this patient done at another site by another physician or qualified health professional. ? ?I have reviewed the Urgent care notes as well. ? ? ? ? ?   ?Assessment & Plan:  ? ?Encounter Diagnoses  ?Name Primary?  ? Chronic pain of right ankle Yes  ? Coalition, calcaneus navicular   ? ?I have given a CAM walker and instructions on how to use.  Use crutches. ? ?I have given contrast bath sheet of instructions. ? ?I will have her see Dr. Sharol Given for further evaluation. ? ?She is agreeable to this. ? ?I have reviewed the [facility] web site prior to prescribing narcotic medicine for this patient. ? ?I have given pain medicine. ? ?She is completing her prednisone Rx today.  I will not refill it. ? ?Call if any problem. ? ?Precautions discussed. ? ?Electronically Signed ?Sanjuana Kava, MD ?4/4/20239:21 AM ? ?

## 2021-08-15 NOTE — Addendum Note (Signed)
Addended by: Brand Males E on: 08/15/2021 09:26 AM ? ? Modules accepted: Orders ? ?

## 2021-08-18 DIAGNOSIS — F411 Generalized anxiety disorder: Secondary | ICD-10-CM | POA: Diagnosis not present

## 2021-08-18 DIAGNOSIS — F4310 Post-traumatic stress disorder, unspecified: Secondary | ICD-10-CM | POA: Diagnosis not present

## 2021-08-21 ENCOUNTER — Ambulatory Visit: Payer: Medicaid Other | Admitting: Orthopedic Surgery

## 2021-08-28 ENCOUNTER — Ambulatory Visit: Payer: Medicaid Other | Admitting: Orthopedic Surgery

## 2021-08-28 ENCOUNTER — Telehealth: Payer: Self-pay | Admitting: Orthopedic Surgery

## 2021-08-28 DIAGNOSIS — G90521 Complex regional pain syndrome I of right lower limb: Secondary | ICD-10-CM | POA: Diagnosis not present

## 2021-08-28 MED ORDER — GABAPENTIN 300 MG PO CAPS: 300.0000 mg | ORAL_CAPSULE | Freq: Three times a day (TID) | ORAL | 3 refills | Status: DC

## 2021-08-28 NOTE — Progress Notes (Addendum)
? ?Office Visit Note ?  ?Patient: Valerie Huber           ?Date of Birth: [birth year]           ?MRN: [id number] ?Visit Date: 08/28/2021 ?             ?Requested by: Lindell Spar, MD ?[address] ?[place],  [place] [address] ?PCP: Lindell Spar, MD ? ?Chief Complaint  ?Patient presents with  ? Right Ankle - Pain  ? ? ? ? ?HPI: ?Patient is a 36 year old woman who is seen for initial evaluation for complex regional pain syndrome.  She has had chronic right ankle pain.  Patient presents in a fracture boot she was seen in urgent care on March 31.  Patient states she has inflammatory pain secondary to chronic injuries.  She states she cannot weight-bear in the morning use of the boot and crutches.  Patient was initially seen by Dr. Aline Brochure in [place] ? ?Assessment & Plan: ?Visit Diagnoses:  ?1. Complex regional pain syndrome i of right lower limb   ? ? ?Plan: Patient has symptoms consistent with complex regional pain syndrome type I, no specific neurologic injury.  She is currently on Cymbalta we will give her prescription for Neurontin recommended massage and discontinuing the fracture boot to work on range of motion.  We will set her up for an urgent consult with Dr. Ernestina Patches for evaluation for a sympathetic block.  No indication for surgical intervention for the right foot and ankle. ? ?Follow-Up Instructions: Return in about 1 week (around 09/04/2021).  ? ?Ortho Exam ? ?Patient is alert, oriented, no adenopathy, well-dressed, normal affect, normal respiratory effort. ?Examination patient was initially resting comfortably and after examination was in acute distress.  She is complains of pain with trying to move her toes or trying to move her ankle she denies any trauma.  Patient has extreme pain to light touch she has a good dorsalis pedis pulse the temperature moisture and color of the skin is normal. ? ?Review of her radiographs from April 31st shows no evidence of a fracture the tibial talar joint is  congruent.  There are sclerotic changes in the subtalar joint and calcaneus.  This appears consistent and unchanged with  the sclerotic changes in the subtalar joint from radiographs in 2018. ? ?Patient had an MRI scan of her lumbar spine on April 19 which showed no lumbar degenerative disc disease no spinal stenosis no neurologic impingement.  The hydration of the discs is normal. ? ?Imaging: ?MR LUMBAR SPINE WO CONTRAST ? ?Result Date: 08/31/2021 ?CLINICAL DATA:  36 year old female with low back pain radiating down the right leg for 3 months. Right foot numbness. EXAM: MRI LUMBAR SPINE WITHOUT CONTRAST TECHNIQUE: Multiplanar, multisequence MR imaging of the lumbar spine was performed. No intravenous contrast was administered. COMPARISON:  Lumbar spine CT 07/02/2021. Lumbar radiographs 08/02/2017. FINDINGS: Segmentation:  Normal on the comparisons. Alignment:  Maintained lumbar lordosis. No spondylolisthesis. Vertebrae: Visualized bone marrow signal is within normal limits. No marrow edema or evidence of acute osseous abnormality. Intact visible sacrum and SI joints. Conus medullaris and cauda equina: Conus extends to the T12-L1 level. No lower spinal cord or conus signal abnormality. Normal cauda equina nerve roots. Normal thecal sac patency throughout. Paraspinal and other soft tissues: Negative. Disc levels: Normal intervertebral disc signal and morphology T11-T12 through L5-S1. No disc degeneration or herniation identified. There is multilevel lumbar facet hypertrophy, generally mild and primarily L2-L3 through L5-S1. No significant spinal or  lateral recess stenosis. And there is no convincing lumbar neural foraminal stenosis. No convincing neural impingement. IMPRESSION: No lumbar disc degeneration, spinal stenosis, or convincing neural impingement. Degenerative lumbar facet hypertrophy is noted and facet disease can be a source of low back pain which sometimes radiates. Electronically Signed   By: Genevie Ann M.D.    On: 08/31/2021 10:05   ?No images are attached to the encounter. ? ?Labs: ?Lab Results  ?Component Value Date  ? ESRSEDRATE 12 02/27/2018  ? ESRSEDRATE 22 02/20/2018  ? ESRSEDRATE 18 10/28/2017  ? LABURIC 4.9 05/17/2017  ? REPTSTATUS 11/25/2014 FINAL 11/23/2014  ? CULT  11/23/2014  ?  80,000 COLONIES/ml GROUP B STREP(S.AGALACTIAE)ISOLATED ?TESTING AGAINST S. AGALACTIAE NOT ROUTINELY PERFORMED DUE TO PREDICTABILITY OF AMP/PEN/VAN SUSCEPTIBILITY. ?Performed at [facility] ?  ? ? ? ?Lab Results  ?Component Value Date  ? ALBUMIN 3.8 12/09/2020  ? ALBUMIN 4.0 08/15/2020  ? ALBUMIN 4.7 04/08/2020  ? ? ?No results found for: MG ?No results found for: VD25OH ? ?No results found for: PREALBUMIN ? ?  Latest Ref Rng & Units 04/02/2021  ?  9:44 AM 12/09/2020  ? 12:42 PM 11/30/2020  ?  7:32 PM  ?CBC EXTENDED  ?WBC 4.0 - 10.5 K/uL 6.0   4.8   5.0    ?RBC 3.87 - 5.11 MIL/uL 3.95   3.77   3.88    ?Hemoglobin 12.0 - 15.0 g/dL 12.4   11.6   12.0    ?HCT 36.0 - 46.0 % 37.9   36.0   37.5    ?Platelets 150 - 400 K/uL 267   241   240    ?NEUT# 1.7 - 7.7 K/uL 2.4    1.4    ?Lymph# 0.7 - 4.0 K/uL 3.0    3.2    ? ? ? ?There is no height or weight on file to calculate BMI. ? ?Orders:  ?No orders of the defined types were placed in this encounter. ? ?Meds ordered this encounter  ?Medications  ? gabapentin (NEURONTIN) 300 MG capsule  ?  Sig: Take 1 capsule (300 mg total) by mouth 3 (three) times daily. 3 times a day when necessary neuropathy pain  ?  Dispense:  90 capsule  ?  Refill:  3  ? ? ? Procedures: ?No procedures performed ? ?Clinical Data: ?No additional findings. ? ?ROS: ? ?All other systems negative, except as noted in the HPI. ?Review of Systems ? ?Objective: ?Vital Signs: LMP 12/08/2020  ? ?Specialty Comments:  ?No specialty comments available. ? ?PMFS History: ?Patient Active Problem List  ? Diagnosis Date Noted  ? DUB (dysfunctional uterine bleeding)   ? Dysmenorrhea   ? Dyspareunia, female   ? History of endometriosis    ? Chest pain 11/07/2020  ? Seasonal allergic rhinitis due to pollen 09/01/2020  ? Encounter for examination following treatment at hospital 04/12/2020  ? Heme positive stool 06/30/2019  ? Chronic diarrhea 06/30/2019  ? Rectal bleeding   ? Complex regional pain syndrome type 1 of right lower extremity 05/20/2017  ? Depression 11/30/2015  ? H/O: substance abuse ([place]) 11/30/2015  ? History of seizures 11/30/2015  ? Marijuana use 07/05/2015  ? Moderate dysplasia of cervix (CIN II)  at endocervical margin of LEEP specimen 01/22/2015  ? Pelvic pain in female 12/31/2014  ? Endometriosis determined by laparoscopy 12/31/2014  ? Second degree uterine prolapse 12/31/2014  ? Irregular periods 12/31/2014  ? 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/21/2014  ? Hematemesis with  nausea   ? Anaphylaxis 09/04/2011  ? ?Past Medical History:  ?Diagnosis Date  ? Asthma   ? 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 Endometriosis   ? Irregular periods 12/31/2014  ? Pelvic pain in female 12/31/2014  ? Pregnant 04/14/2015  ? Second degree uterine prolapse 12/31/2014  ? Seizures ([place])   ? Vaginal Pap smear, abnormal   ?  ?Family History  ?Problem Relation Age of Onset  ? Cancer Mother   ?     kidney   ? Asthma Mother   ? Hypertension Mother   ? Colon polyps Sister   ?     60, large polyp, done at [facility]   ? Cancer Paternal Grandmother   ? Asthma Son   ? Colon cancer Neg Hx   ?  ?Past Surgical History:  ?Procedure Laterality Date  ? BIOPSY  07/06/2019  ? Procedure: BIOPSY;  Surgeon: Daneil Dolin, MD;  [facility];  Service: Endoscopy;;  colon ?  ? COLONOSCOPY WITH PROPOFOL N/A 07/06/2019  ? one 3 mm polyp hyperplastic, segmental biopsies negative  ? DILITATION & CURRETTAGE/HYSTROSCOPY WITH NOVASURE ABLATION N/A 10/08/2017  ? Procedure: DILATATION & CURETTAGE/HYSTEROSCOPY WITH MINERVA  ENDOMETRIAL ABLATION (Procedure #2);  Surgeon: [name], MD;  Location: [facility];  Service: Gynecology;  Laterality: N/A;  ? ESOPHAGOGASTRODUODENOSCOPY N/A  12/10/2014  ? SLF: hematoemesis due to moderate erosive gastritis, duoedentitis, and duodenal ulcer  ? LEEP    ? POLYPECTOMY  07/06/2019  ? Procedure: POLYPECTOMY;  Surgeon: Daneil Dolin, MD;  Location: [facility]

## 2021-08-28 NOTE — Telephone Encounter (Signed)
Patient request a stronger medication for pain has intensified because of fall on 08/28/21  ?

## 2021-08-29 ENCOUNTER — Other Ambulatory Visit: Payer: Self-pay

## 2021-08-29 ENCOUNTER — Other Ambulatory Visit: Payer: Self-pay | Admitting: Orthopedic Surgery

## 2021-08-29 ENCOUNTER — Telehealth: Payer: Self-pay

## 2021-08-29 DIAGNOSIS — G90521 Complex regional pain syndrome I of right lower limb: Secondary | ICD-10-CM

## 2021-08-29 DIAGNOSIS — G8929 Other chronic pain: Secondary | ICD-10-CM

## 2021-08-29 MED ORDER — OXYCODONE-ACETAMINOPHEN 5-325 MG PO TABS: 1.0000 | ORAL_TABLET | Freq: Four times a day (QID) | ORAL | 0 refills | Status: DC | PRN

## 2021-08-29 NOTE — Telephone Encounter (Signed)
I called pt and advised of message below. Pt will d/c the Neurontin and is aware that rx for Percocet has been sent ot pharm. This is for her to use while pending nerve block with FN. Will call with any questions.  ?

## 2021-08-29 NOTE — Telephone Encounter (Signed)
I have entered a referral for this pt per Dr. Sharol Given to see if we can get her in for a sympathetic block for her right ankle CRPS. Do you think we can get her set up this week? Let me know and I will call the pt.  ?

## 2021-08-29 NOTE — Telephone Encounter (Signed)
Patient called again stating that she is in a lot a pain, has Diarrhea, and  is nauseous.  Did advise patient that message have been sent to Dr. Sharol Given and we are just waiting on a response. CB# [phone].  Please advise.  Thank you. ?

## 2021-08-29 NOTE — Telephone Encounter (Signed)
I called pt to advise of plan for sympathetic block. For 10 minutes the pt was yelling and stating that she is in pain and her opinion should matter more than the doctor and that nothing has been done to help her and she is tired of being in pain and that non one cares about what she is going through and she needs something called to the pharmacy right now. I advised that Dr. Sharol Given called in Neurontin for her yesterday and the pt states that this has caused he to have nausea and diarrhea and that she is not able to tolerate it Pt is asking for something else to take while she is awaiting sch for her block.  ?

## 2021-08-29 NOTE — Telephone Encounter (Signed)
Duplication message this has been sent to Dr. Sharol Given to address. Will sign off on this message and await advisement  ?

## 2021-08-29 NOTE — Telephone Encounter (Signed)
You saw this pt in the office yesterday new pt CRPS asking for pain medication. States the pain has intensified since yesterday. Taking Neurontin 300 mg TID and Cymbalta 30 mg qd please advise.   ?

## 2021-08-29 NOTE — Telephone Encounter (Signed)
Pt has called multiple times today. Once question was answered I called pt and discussed plan. Pt very upset and asking for immediate pain relief. Last refill was for Hydrocodone 5/325 #30 on 08/15/2021 message was sent to Dr. Sharol Given to see if another medication can be called to pharmacy while pending nerve block.  ?

## 2021-08-29 NOTE — Telephone Encounter (Signed)
Patients mom is calling back again stating she never heard back from anyone yesterday and patient is in severe pain, gabapentin not helping  ?

## 2021-08-30 ENCOUNTER — Ambulatory Visit (HOSPITAL_COMMUNITY)
Admission: RE | Admit: 2021-08-30 | Discharge: 2021-08-30 | Disposition: A | Discharge status: Home / Self Care | Payer: Medicaid Other | Source: Ambulatory Visit | Attending: Neurological Surgery | Admitting: Neurological Surgery

## 2021-08-30 DIAGNOSIS — R200 Anesthesia of skin: Secondary | ICD-10-CM | POA: Diagnosis not present

## 2021-08-30 DIAGNOSIS — M5416 Radiculopathy, lumbar region: Secondary | ICD-10-CM | POA: Insufficient documentation

## 2021-08-30 DIAGNOSIS — M545 Low back pain, unspecified: Secondary | ICD-10-CM | POA: Diagnosis not present

## 2021-08-31 ENCOUNTER — Encounter: Payer: Self-pay | Admitting: Orthopedic Surgery

## 2021-08-31 ENCOUNTER — Ambulatory Visit: Payer: Medicaid Other | Admitting: Orthopedic Surgery

## 2021-09-04 ENCOUNTER — Ambulatory Visit (INDEPENDENT_AMBULATORY_CARE_PROVIDER_SITE_OTHER): Payer: Medicaid Other | Admitting: Orthopedic Surgery

## 2021-09-04 ENCOUNTER — Encounter: Payer: Self-pay | Admitting: Orthopedic Surgery

## 2021-09-04 DIAGNOSIS — G90521 Complex regional pain syndrome I of right lower limb: Secondary | ICD-10-CM

## 2021-09-04 MED ORDER — AMITRIPTYLINE HCL 25 MG PO TABS: 25.0000 mg | ORAL_TABLET | Freq: Every day | ORAL | 3 refills | Status: DC

## 2021-09-04 NOTE — Progress Notes (Signed)
? ?Office Visit Note ?  ?Patient: Valerie Huber           ?Date of Birth: Dec 15, 1985           ?MRN: [id number] ?Visit Date: 09/04/2021 ?             ?Requested by: Lindell Spar, MD ?[address] ?[place],  [place] [address] ?PCP: Lindell Spar, MD ? ?Chief Complaint  ?Patient presents with  ? Right Ankle - Pain, Follow-up  ? ? ? ? ?HPI: ?Patient is a 36 year old woman who presents in follow-up for complex regional pain syndrome right ankle.  She states the Cymbalta and Neurontin made her feel nauseated and she had diarrhea.  She has been using heat and ice and working on range of motion of her ankle.  She was referred to Dr. Ernestina Patches and she states she has a sympathetic block on May 20. ? ?Assessment & Plan: ?Visit Diagnoses:  ?1. Complex regional pain syndrome i of right lower limb   ? ? ?Plan: We will call in a prescription for Elavil she will not take the Neurontin or Cymbalta.  She will continue with Percocet 4 times a day. ? ?Follow-Up Instructions: Return in about 1 week (around 09/11/2021).  ? ?Ortho Exam ? ?Patient is alert, oriented, no adenopathy, well-dressed, normal affect, normal respiratory effort. ?Examination patient has good pulses there is no skin color or temperature changes.  There is good wrinkling of the skin no significant swelling she does have global tenderness to palpation worse over the superficial peroneal nerve distribution as well as posterior to the lateral malleolus over the peroneal distribution.  No focal nerve irritation.  No redness no cellulitis no signs of infection. ? ?Imaging: ?No results found. ?No images are attached to the encounter. ? ?Labs: ?Lab Results  ?Component Value Date  ? ESRSEDRATE 12 02/27/2018  ? ESRSEDRATE 22 02/20/2018  ? ESRSEDRATE 18 10/28/2017  ? LABURIC 4.9 05/17/2017  ? REPTSTATUS 11/25/2014 FINAL 11/23/2014  ? CULT  11/23/2014  ?  80,000 COLONIES/ml GROUP B STREP(S.AGALACTIAE)ISOLATED ?TESTING AGAINST S. AGALACTIAE NOT ROUTINELY PERFORMED DUE TO  PREDICTABILITY OF AMP/PEN/VAN SUSCEPTIBILITY. ?Performed at [facility] ?  ? ? ? ?Lab Results  ?Component Value Date  ? ALBUMIN 3.8 12/09/2020  ? ALBUMIN 4.0 08/15/2020  ? ALBUMIN 4.7 04/08/2020  ? ? ?No results found for: MG ?No results found for: VD25OH ? ?No results found for: PREALBUMIN ? ?  Latest Ref Rng & Units 04/02/2021  ?  9:44 AM 12/09/2020  ? 12:42 PM 11/30/2020  ?  7:32 PM  ?CBC EXTENDED  ?WBC 4.0 - 10.5 K/uL 6.0   4.8   5.0    ?RBC 3.87 - 5.11 MIL/uL 3.95   3.77   3.88    ?Hemoglobin 12.0 - 15.0 g/dL 12.4   11.6   12.0    ?HCT 36.0 - 46.0 % 37.9   36.0   37.5    ?Platelets 150 - 400 K/uL 267   241   240    ?NEUT# 1.7 - 7.7 K/uL 2.4    1.4    ?Lymph# 0.7 - 4.0 K/uL 3.0    3.2    ? ? ? ?There is no height or weight on file to calculate BMI. ? ?Orders:  ?No orders of the defined types were placed in this encounter. ? ?Meds ordered this encounter  ?Medications  ? amitriptyline (ELAVIL) 25 MG tablet  ?  Sig: Take 1 tablet (25 mg total)  by mouth at bedtime.  ?  Dispense:  30 tablet  ?  Refill:  3  ? ? ? Procedures: ?No procedures performed ? ?Clinical Data: ?No additional findings. ? ?ROS: ? ?All other systems negative, except as noted in the HPI. ?Review of Systems ? ?Objective: ?Vital Signs: LMP 12/08/2020  ? ?Specialty Comments:  ?No specialty comments available. ? ?PMFS History: ?Patient Active Problem List  ? Diagnosis Date Noted  ? DUB (dysfunctional uterine bleeding)   ? Dysmenorrhea   ? Dyspareunia, female   ? History of endometriosis   ? Chest pain 11/07/2020  ? Seasonal allergic rhinitis due to pollen 09/01/2020  ? Encounter for examination following treatment at hospital 04/12/2020  ? Heme positive stool 06/30/2019  ? Chronic diarrhea 06/30/2019  ? Rectal bleeding   ? Complex regional pain syndrome type 1 of right lower extremity 05/20/2017  ? Depression 11/30/2015  ? H/O: substance abuse ([place]) 11/30/2015  ? History of seizures 11/30/2015  ? Marijuana use 07/05/2015  ? Moderate dysplasia of  cervix (CIN II)  at endocervical margin of LEEP specimen 01/22/2015  ? Pelvic pain in female 12/31/2014  ? Endometriosis determined by laparoscopy 12/31/2014  ? Second degree uterine prolapse 12/31/2014  ? Irregular periods 12/31/2014  ? 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/21/2014  ? Hematemesis with nausea   ? Anaphylaxis 09/04/2011  ? ?Past Medical History:  ?Diagnosis Date  ? Asthma   ? 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 Endometriosis   ? Irregular periods 12/31/2014  ? Pelvic pain in female 12/31/2014  ? Pregnant 04/14/2015  ? Second degree uterine prolapse 12/31/2014  ? Seizures ([place])   ? Vaginal Pap smear, abnormal   ?  ?Family History  ?Problem Relation Age of Onset  ? Cancer Mother   ?     kidney   ? Asthma Mother   ? Hypertension Mother   ? Colon polyps Sister   ?     56, large polyp, done at [facility]   ? Cancer Paternal Grandmother   ? Asthma Son   ? Colon cancer Neg Hx   ?  ?Past Surgical History:  ?Procedure Laterality Date  ? BIOPSY  07/06/2019  ? Procedure: BIOPSY;  Surgeon: Daneil Dolin, MD;  [facility];  Service: Endoscopy;;  colon ?  ? COLONOSCOPY WITH PROPOFOL N/A 07/06/2019  ? one 3 mm polyp hyperplastic, segmental biopsies negative  ? DILITATION & CURRETTAGE/HYSTROSCOPY WITH NOVASURE ABLATION N/A 10/08/2017  ? Procedure: DILATATION & CURETTAGE/HYSTEROSCOPY WITH MINERVA  ENDOMETRIAL ABLATION (Procedure #2);  Surgeon: [name], MD;  Location: [facility];  Service: Gynecology;  Laterality: N/A;  ? ESOPHAGOGASTRODUODENOSCOPY N/A 12/10/2014  ? SLF: hematoemesis due to moderate erosive gastritis, duoedentitis, and duodenal ulcer  ? LEEP    ? POLYPECTOMY  07/06/2019  ? Procedure: POLYPECTOMY;  Surgeon: Daneil Dolin, MD;  [facility];  Service: Endoscopy;;  ? surgery for endometriosis  2005  ? TUBAL LIGATION Bilateral 10/08/2017  ? Procedure: BILATERAL TUBAL STERILIZATION WITH FALLOPE RINGS APPLICATION (Procedure #1);  Surgeon: [name], MD;  Location: [facility];  Service:  Gynecology;  Laterality: Bilateral;  ? VAGINAL HYSTERECTOMY N/A 12/14/2020  ? Procedure: TOTAL HYSTERECTOMY VAGINAL;  Surgeon: Florian Buff, MD;  Location: [facility];  Service: Gynecology;  Laterality: N/A;  ? ?Social History  ? ?Occupational History  ? Occupation: Housecleaning  ?Tobacco Use  ? Smoking status: Former  ?  Packs/day: 0.25  ?  Years: 14.00  ?  Pack years: 3.50  ?  Types: Cigarettes  ? Smokeless tobacco: Never  ? Tobacco comments:  ?  quit in 2017   ?Vaping Use  ? Vaping Use: Never used  ?Substance and Sexual Activity  ? Alcohol use: Not Currently  ?  Comment: occ  ? Drug use: Yes  ?  Types: Marijuana  ? Sexual activity: Yes  ?  Birth control/protection: Surgical  ?  Comment: tubal and ablation  ? ? ? ? ? ?

## 2021-09-07 ENCOUNTER — Ambulatory Visit: Payer: Medicaid Other | Admitting: Internal Medicine

## 2021-09-07 ENCOUNTER — Encounter: Payer: Self-pay | Admitting: Internal Medicine

## 2021-09-07 VITALS — BP 106/80 | HR 87 | Resp 18 | Ht 64.0 in | Wt 189.4 lb

## 2021-09-07 DIAGNOSIS — K219 Gastro-esophageal reflux disease without esophagitis: Secondary | ICD-10-CM | POA: Insufficient documentation

## 2021-09-07 DIAGNOSIS — G4733 Obstructive sleep apnea (adult) (pediatric): Secondary | ICD-10-CM | POA: Insufficient documentation

## 2021-09-07 DIAGNOSIS — G90521 Complex regional pain syndrome I of right lower limb: Secondary | ICD-10-CM | POA: Diagnosis not present

## 2021-09-07 NOTE — Assessment & Plan Note (Signed)
Has snoring, daytime fatigue and headache ?Needs a sleep study -going to see sleep specialist at [facility] ?

## 2021-09-07 NOTE — Assessment & Plan Note (Signed)
ENT visit note reviewed ?On omeprazole for GERD now ?

## 2021-09-07 NOTE — Progress Notes (Signed)
? ?Established Patient Office Visit ? ?Subjective:  ?Patient ID: Valerie Huber, female    DOB: [birth year]  Age: 36 y.o. MRN: [id number] ? ?CC:  ?Chief Complaint  ?Patient presents with  ? Follow-up  ?  Follow up   ? ? ?HPI ?Valerie Huber is a 36 y.o. female with past medical history of complex regional pain syndrome of RLE, MDD and GERD who presents for f/u of her chronic medical conditions. ? ?She has been evaluated by ENT specialist for chronic sore throat and recurrent tonsillitis, but was deemed not to be a surgical candidate for tonsillectomy.  She had [place] of thyroid done, which was benign.  Laryngoscopy exam was benign except showed oropharyngeal reflux, for which she has been taking Omeprazole. ? ?She follows up with Dr. Sharol Given for complex regional pain syndrome of her RLE, and was recently started on amitriptyline.  She has stopped taking Cymbalta now.  She has crutches for walking support now. ? ? ? ?Past Medical History:  ?Diagnosis Date  ? Asthma   ? 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 Endometriosis   ? Irregular periods 12/31/2014  ? Pelvic pain in female 12/31/2014  ? Pregnant 04/14/2015  ? Second degree uterine prolapse 12/31/2014  ? Seizures ([place])   ? Vaginal Pap smear, abnormal   ? ? ?Past Surgical History:  ?Procedure Laterality Date  ? BIOPSY  07/06/2019  ? Procedure: BIOPSY;  Surgeon: Daneil Dolin, MD;  [facility];  Service: Endoscopy;;  colon ?  ? COLONOSCOPY WITH PROPOFOL N/A 07/06/2019  ? one 3 mm polyp hyperplastic, segmental biopsies negative  ? DILITATION & CURRETTAGE/HYSTROSCOPY WITH NOVASURE ABLATION N/A 10/08/2017  ? Procedure: DILATATION & CURETTAGE/HYSTEROSCOPY WITH MINERVA  ENDOMETRIAL ABLATION (Procedure #2);  Surgeon: [name], MD;  Location: [facility];  Service: Gynecology;  Laterality: N/A;  ? ESOPHAGOGASTRODUODENOSCOPY N/A 12/10/2014  ? SLF: hematoemesis due to moderate erosive gastritis, duoedentitis, and duodenal ulcer  ? LEEP    ? POLYPECTOMY  07/06/2019  ?  Procedure: POLYPECTOMY;  Surgeon: Daneil Dolin, MD;  [facility];  Service: Endoscopy;;  ? surgery for endometriosis  2005  ? TUBAL LIGATION Bilateral 10/08/2017  ? Procedure: BILATERAL TUBAL STERILIZATION WITH FALLOPE RINGS APPLICATION (Procedure #1);  Surgeon: [name], MD;  Location: [facility];  Service: Gynecology;  Laterality: Bilateral;  ? VAGINAL HYSTERECTOMY N/A 12/14/2020  ? Procedure: TOTAL HYSTERECTOMY VAGINAL;  Surgeon: Florian Buff, MD;  Location: [facility];  Service: Gynecology;  Laterality: N/A;  ? ? ?Family History  ?Problem Relation Age of Onset  ? Cancer Mother   ?     kidney   ? Asthma Mother   ? Hypertension Mother   ? Colon polyps Sister   ?     48, large polyp, done at [facility]   ? Cancer Paternal Grandmother   ? Asthma Son   ? Colon cancer Neg Hx   ? ? ?Social History  ? ?Socioeconomic History  ? Marital status: Single  ?  Spouse name: Not on file  ? Number of children: Not on file  ? Years of education: Not on file  ? Highest education level: Not on file  ?Occupational History  ? Occupation: Housecleaning  ?Tobacco Use  ? Smoking status: Former  ?  Packs/day: 0.25  ?  Years: 14.00  ?  Pack years: 3.50  ?  Types: Cigarettes  ? Smokeless tobacco: Never  ? Tobacco comments:  ?  quit in 2017   ?Vaping  Use  ? Vaping Use: Never used  ?Substance and Sexual Activity  ? Alcohol use: Not Currently  ?  Comment: occ  ? Drug use: Yes  ?  Types: Marijuana  ? Sexual activity: Yes  ?  Birth control/protection: Surgical  ?  Comment: tubal and ablation  ?Other Topics Concern  ? Not on file  ?Social History Narrative  ? Not on file  ? ?Social Determinants of Health  ? ?Financial Resource Strain: Not on file  ?Food Insecurity: No Food Insecurity  ? Worried About Charity fundraiser in the Last Year: Never true  ? Ran Out of Food in the Last Year: Never true  ?Transportation Needs: No Transportation Needs  ? Lack of Transportation (Medical): No  ? Lack of Transportation (Non-Medical): No  ?Physical  Activity: Inactive  ? Days of Exercise per Week: 0 days  ? Minutes of Exercise per Session: 0 min  ?Stress: Not on file  ?Social Connections: Not on file  ?Intimate Partner Violence: Not At Risk  ? Fear of Current or Ex-Partner: No  ? Emotionally Abused: No  ? Physically Abused: No  ? Sexually Abused: No  ? ? ?Outpatient Medications Prior to Visit  ?Medication Sig Dispense Refill  ? acetaminophen (TYLENOL) 500 MG tablet Take 500-1,500 mg by mouth every 6 (six) hours as needed for moderate pain.    ? amitriptyline (ELAVIL) 25 MG tablet Take 1 tablet (25 mg total) by mouth at bedtime. 30 tablet 3  ? gabapentin (NEURONTIN) 300 MG capsule Take 1 capsule (300 mg total) by mouth 3 (three) times daily. 3 times a day when necessary neuropathy pain 90 capsule 3  ? omeprazole (PRILOSEC) 40 MG capsule Take 40 mg by mouth daily.    ? DULoxetine (CYMBALTA) 30 MG capsule TAKE 1 CAPSULE(30 MG) BY MOUTH DAILY 30 capsule 0  ? oxyCODONE-acetaminophen (PERCOCET/ROXICET) 5-325 MG tablet Take 1 tablet by mouth every 6 (six) hours as needed for severe pain. (Patient not taking: Reported on 09/07/2021) 30 tablet 0  ? predniSONE (DELTASONE) 50 MG tablet Take 1 tablet (50 mg total) by mouth daily with breakfast. (Patient not taking: Reported on 09/07/2021) 5 tablet 0  ? ?No facility-administered medications prior to visit.  ? ? ?Allergies  ?Allergen Reactions  ? Flexeril [Cyclobenzaprine] Hives  ? Ibuprofen Anaphylaxis  ?  Throat, eyes, lips swollen  ? ? ?ROS ?Review of Systems  ?Constitutional:  Negative for chills and fever.  ?HENT:  Negative for congestion, sinus pressure, sinus pain and sore throat.   ?Eyes:  Negative for pain and discharge.  ?Respiratory:  Negative for cough and shortness of breath.   ?Cardiovascular:  Negative for chest pain and palpitations.  ?Gastrointestinal:  Negative for abdominal pain, diarrhea, nausea and vomiting.  ?Endocrine: Negative for polydipsia and polyuria.  ?Genitourinary:  Negative for dysuria and  hematuria.  ?Musculoskeletal:  Positive for arthralgias, back pain and gait problem. Negative for neck pain and neck stiffness.  ?Skin:  Negative for rash.  ?Neurological:  Negative for dizziness and weakness.  ?Psychiatric/Behavioral:  Negative for agitation and behavioral problems.   ? ?  ?Objective:  ?  ?Physical Exam ?Vitals reviewed.  ?Constitutional:   ?   General: She is not in acute distress. ?   Appearance: She is not diaphoretic.  ?   Comments: Has crutches  ?HENT:  ?   Head: Normocephalic and atraumatic.  ?   Nose: Nose normal.  ?   Mouth/Throat:  ?   Mouth: Mucous  membranes are moist.  ?Eyes:  ?   General: No scleral icterus. ?   Extraocular Movements: Extraocular movements intact.  ?Cardiovascular:  ?   Rate and Rhythm: Normal rate and regular rhythm.  ?   Pulses: Normal pulses.  ?   Heart sounds: Normal heart sounds. No murmur heard. ?Pulmonary:  ?   Breath sounds: Normal breath sounds. No wheezing or rales.  ?Musculoskeletal:  ?   Cervical back: Neck supple. No tenderness.  ?   Comments: Orthotic shoe on right side  ?Skin: ?   General: Skin is warm.  ?   Findings: No rash.  ?   Comments: Scar in the right side of neck area - C/D/I  ?Neurological:  ?   General: No focal deficit present.  ?   Mental Status: She is alert and oriented to person, place, and time.  ?   Sensory: No sensory deficit.  ?   Motor: No weakness.  ?Psychiatric:     ?   Mood and Affect: Mood normal.     ?   Behavior: Behavior normal.  ? ? ?BP 106/80 (BP Location: Right Arm, Patient Position: Sitting, Cuff Size: Normal)   Pulse 87   Resp 18   Ht '5\' 4"'$  (1.626 m)   Wt 189 lb 6.4 oz (85.9 kg)   LMP 12/08/2020   SpO2 99%   BMI 32.51 kg/m?  ?Wt Readings from Last 3 Encounters:  ?09/07/21 189 lb 6.4 oz (85.9 kg)  ?08/15/21 179 lb (81.2 kg)  ?07/02/21 181 lb 10.5 oz (82.4 kg)  ? ? ?Lab Results  ?Component Value Date  ? TSH 0.684 12/31/2014  ? ?Lab Results  ?Component Value Date  ? WBC 6.0 04/02/2021  ? HGB 12.4 04/02/2021  ? HCT 37.9  04/02/2021  ? MCV 95.9 04/02/2021  ? PLT 267 04/02/2021  ? ?Lab Results  ?Component Value Date  ? NA 138 04/02/2021  ? K 3.8 04/02/2021  ? CO2 24 04/02/2021  ? GLUCOSE 102 (H) 04/02/2021  ? BUN 16 04/02/2021  ? CREATININE

## 2021-09-07 NOTE — Patient Instructions (Signed)
Please contact your ENT specialist about US neck. ?Phone: [phone] ? ?Please continue to take medications as prescribed. ? ?Please continue to cut down -> quit smoking. ?

## 2021-09-07 NOTE — Assessment & Plan Note (Signed)
Followed by orthopedic surgery - Dr. Sharol Given ?On amitriptyline now ?Going to see spine surgery for lumbar back pain evaluation ?

## 2021-09-10 ENCOUNTER — Emergency Department (HOSPITAL_COMMUNITY)
Admission: EM | Admit: 2021-09-10 | Discharge: 2021-09-10 | Disposition: A | Discharge status: Home / Self Care | Payer: Medicaid Other | Attending: Emergency Medicine | Admitting: Emergency Medicine

## 2021-09-10 ENCOUNTER — Encounter (HOSPITAL_COMMUNITY): Payer: Self-pay

## 2021-09-10 ENCOUNTER — Emergency Department (HOSPITAL_COMMUNITY): Payer: Medicaid Other

## 2021-09-10 ENCOUNTER — Other Ambulatory Visit: Payer: Self-pay

## 2021-09-10 DIAGNOSIS — M19071 Primary osteoarthritis, right ankle and foot: Secondary | ICD-10-CM | POA: Diagnosis not present

## 2021-09-10 DIAGNOSIS — R000 Tachycardia, unspecified: Secondary | ICD-10-CM | POA: Diagnosis not present

## 2021-09-10 DIAGNOSIS — M795 Residual foreign body in soft tissue: Secondary | ICD-10-CM | POA: Insufficient documentation

## 2021-09-10 DIAGNOSIS — M25571 Pain in right ankle and joints of right foot: Secondary | ICD-10-CM | POA: Diagnosis present

## 2021-09-10 DIAGNOSIS — Z23 Encounter for immunization: Secondary | ICD-10-CM | POA: Insufficient documentation

## 2021-09-10 DIAGNOSIS — S90851A Superficial foreign body, right foot, initial encounter: Secondary | ICD-10-CM | POA: Diagnosis not present

## 2021-09-10 DIAGNOSIS — S99911A Unspecified injury of right ankle, initial encounter: Secondary | ICD-10-CM | POA: Diagnosis not present

## 2021-09-10 DIAGNOSIS — S99921A Unspecified injury of right foot, initial encounter: Secondary | ICD-10-CM | POA: Diagnosis not present

## 2021-09-10 MED ORDER — CEPHALEXIN 500 MG PO CAPS: 500.0000 mg | ORAL_CAPSULE | Freq: Four times a day (QID) | ORAL | 0 refills | Status: DC

## 2021-09-10 MED ORDER — OXYCODONE-ACETAMINOPHEN 5-325 MG PO TABS: 1.0000 | ORAL_TABLET | Freq: Four times a day (QID) | ORAL | 0 refills | Status: DC | PRN

## 2021-09-10 MED ORDER — LORAZEPAM 2 MG/ML IJ SOLN
0.5000 mg | Freq: Once | INTRAMUSCULAR | Status: AC
  Administered 2021-09-10: 0.5 mg via INTRAVENOUS
  Filled 2021-09-10: qty 1

## 2021-09-10 MED ORDER — FENTANYL CITRATE PF 50 MCG/ML IJ SOSY
50.0000 ug | PREFILLED_SYRINGE | Freq: Once | INTRAMUSCULAR | Status: AC
  Administered 2021-09-10: 50 ug via INTRAVENOUS
  Filled 2021-09-10: qty 1

## 2021-09-10 MED ORDER — LIDOCAINE HCL (PF) 1 % IJ SOLN
5.0000 mL | Freq: Once | INTRAMUSCULAR | Status: AC
  Administered 2021-09-10: 5 mL
  Filled 2021-09-10: qty 5

## 2021-09-10 MED ORDER — ONDANSETRON HCL 4 MG PO TABS: 4.0000 mg | ORAL_TABLET | Freq: Four times a day (QID) | ORAL | 0 refills | Status: DC

## 2021-09-10 MED ORDER — HYDROMORPHONE HCL 1 MG/ML IJ SOLN
1.0000 mg | Freq: Once | INTRAMUSCULAR | Status: AC
  Administered 2021-09-10: 1 mg via INTRAVENOUS
  Filled 2021-09-10: qty 1

## 2021-09-10 MED ORDER — TETANUS-DIPHTH-ACELL PERTUSSIS 5-2.5-18.5 LF-MCG/0.5 IM SUSY
0.5000 mL | PREFILLED_SYRINGE | Freq: Once | INTRAMUSCULAR | Status: AC
  Administered 2021-09-10: 0.5 mL via INTRAMUSCULAR
  Filled 2021-09-10: qty 0.5

## 2021-09-10 NOTE — Discharge Instructions (Addendum)
Follow-up with Dr. Sharol Given either tomorrow or the day after for reevaluation and to remove the foreign body.  Take Keflex 4 times daily for 5 days.  Take the Percocet every 6 hours as needed for severe pain, take with Zofran to prevent any nausea or vomiting. ?

## 2021-09-10 NOTE — ED Notes (Signed)
Patient very restless in bed, crying and increase pain. Leg support on pillow.  ?

## 2021-09-10 NOTE — ED Triage Notes (Signed)
Patient complaining of ankle injury since March, boot in place. Tried to walk this am without it. Complaining of severe pain with bone felt to ankle area.  ?

## 2021-09-10 NOTE — ED Provider Notes (Signed)
?[place] ?Provider Note ? ? ?CSN: [id number] ?Arrival date & time: 09/10/21  1101 ? ?  ? ?History ? ?Chief Complaint  ?Patient presents with  ? Ankle Pain  ? ? ?Valerie Huber is a 36 y.o. female. ? ? ?Ankle Pain ? ?Patient with medical history notable for complex regional pain syndrome of right lower extremity, documented history of previous substance use disorder (patient and boyfriend deny any former or current drug use) , chronic ankle pain followed by Dr. Sharol Given presents today due to right ankle pain.  Patient states she has been having ankle pain for some time, she was recently seen in the office by orthopedic doctor 4/24 and states she was switched from Cymbalta to amitriptyline.  States this has not improved her pain.  She usually ambulates with the assistance of crutches, states she tried to walk this morning but was unable to bear weight due to severe pain to the right ankle.  She feels like a bone is sticking out of her ankle. ? ?Home Medications ?Prior to Admission medications   ?Medication Sig Start Date End Date Taking? Authorizing Provider  ?cephALEXin (KEFLEX) 500 MG capsule Take 1 capsule (500 mg total) by mouth 4 (four) times daily. 09/10/21  Yes Sherrill Raring, PA-C  ?ondansetron (ZOFRAN) 4 MG tablet Take 1 tablet (4 mg total) by mouth every 6 (six) hours. 09/10/21  Yes Sherrill Raring, PA-C  ?oxyCODONE-acetaminophen (PERCOCET/ROXICET) 5-325 MG tablet Take 1 tablet by mouth every 6 (six) hours as needed for severe pain. 09/10/21  Yes Sherrill Raring, PA-C  ?amitriptyline (ELAVIL) 25 MG tablet Take 1 tablet (25 mg total) by mouth at bedtime. 09/04/21   Newt Minion, MD  ?gabapentin (NEURONTIN) 300 MG capsule Take 1 capsule (300 mg total) by mouth 3 (three) times daily. 3 times a day when necessary neuropathy pain 08/28/21   Newt Minion, MD  ?omeprazole (PRILOSEC) 40 MG capsule Take 40 mg by mouth daily.    [provider]  ?   ? ?Allergies    ?Flexeril [cyclobenzaprine] and  Ibuprofen   ? ?Review of Systems   ?Review of Systems ? ?Physical Exam ?Updated Vital Signs ?BP (!) 125/92   Pulse 89   Temp 99.8 ?F (37.7 ?C) (Oral)   Resp 20   Ht '5\' 4"'$  (1.626 m)   Wt 83.9 kg   LMP 12/08/2020   SpO2 100%   BMI 31.76 kg/m?  ?Physical Exam ?Vitals and nursing note reviewed. Exam conducted with a chaperone present.  ?Constitutional:   ?   General: She is in acute distress.  ?   Appearance: Normal appearance.  ?   Comments: Patient tearful  ?HENT:  ?   Head: Normocephalic and atraumatic.  ?Eyes:  ?   General: No scleral icterus.    ?   Right eye: No discharge.     ?   Left eye: No discharge.  ?   Extraocular Movements: Extraocular movements intact.  ?   Pupils: Pupils are equal, round, and reactive to light.  ?Cardiovascular:  ?   Rate and Rhythm: Regular rhythm. Tachycardia present.  ?   Pulses: Normal pulses.  ?   Heart sounds: Normal heart sounds. No murmur heard. ?  No friction rub. No gallop.  ?Pulmonary:  ?   Effort: Pulmonary effort is normal. No respiratory distress.  ?   Breath sounds: Normal breath sounds.  ?Abdominal:  ?   General: Abdomen is flat. Bowel sounds are normal. There is no  distension.  ?   Palpations: Abdomen is soft.  ?   Tenderness: There is no abdominal tenderness.  ?Musculoskeletal:     ?   General: Swelling and tenderness present.  ?   Comments: Patient with diffuse tenderness to the right ankle and dorsum of foot.  Not well localized, no specific crepitus.  Palpable, hard area to the malleolus.  See photo.  ?Skin: ?   General: Skin is warm and dry.  ?   Coloration: Skin is not jaundiced.  ?Neurological:  ?   Mental Status: She is alert. Mental status is at baseline.  ?   Coordination: Coordination normal.  ? ? ? ?ED Results / Procedures / Treatments   ?Labs ?(all labs ordered are listed, but only abnormal results are displayed) ?Labs Reviewed - No data to display ? ?EKG ?None ? ?Radiology ?DG Ankle Complete Right ? ?Result Date: 09/10/2021 ?CLINICAL DATA:   36 year old female with ankle injury in March. Recurrent severe pain. EXAM: RIGHT ANKLE - COMPLETE 3+ VIEW COMPARISON:  08/11/2021 right ankle series. FINDINGS: New roughly 19 mm linear metallic foreign body traversing the lateral malleolus and overlying skin since March. Mortise joint alignment is stable. No evidence of ankle joint effusion. Chronically abnormal posterior subtalar joint redemonstrated. No acute osseous abnormality identified. IMPRESSION: 1. New since March 19 mm linear metallic foreign body traversing the lateral malleolus and overlying skin. 2. Chronically abnormal posterior subtalar joint. No acute osseous abnormality identified. Electronically Signed   By: Genevie Ann M.D.   On: 09/10/2021 12:18  ? ?DG Foot Complete Right ? ?Result Date: 09/10/2021 ?CLINICAL DATA:  Complaining of ankle injury since March. Pain with walking. EXAM: RIGHT FOOT COMPLETE - 3+ VIEW COMPARISON:  Ankle 08/11/2021 FINDINGS: Linear metallic foreign body is identified within the fibula. This is new since 78/29/5621 and is of uncertain clinical significance. No signs of acute fracture or dislocation. 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 Soft tissues appear unremarkable. IMPRESSION: 1. Linear metallic foreign body within the fibula is new since 30/86/5784 and is of uncertain clinical significance. 2. No acute bone abnormality. 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 Electronically Signed   By: Kerby Moors M.D.   On: 09/10/2021 12:21   ? ?Procedures ?Procedures  ? ? ?Medications Ordered in ED ?Medications  ?fentaNYL (SUBLIMAZE) injection 50 mcg (50 mcg Intravenous Given 09/10/21 1135)  ?HYDROmorphone (DILAUDID) injection 1 mg (1 mg Intravenous Given 09/10/21 1259)  ?Tdap (BOOSTRIX) injection 0.5 mL (0.5 mLs Intramuscular Given 09/10/21 1300)  ?lidocaine (PF) (XYLOCAINE) 1 % injection 5 mL (5 mLs  Infiltration Given 09/10/21 1324)  ?LORazepam (ATIVAN) injection 0.5 mg (0.5 mg Intravenous Given 09/10/21 1256)  ? ? ?ED Course/ Medical Decision Making/ A&P ?  ?                        ?Medical Decision Making ?Amount and/or Complexity of Data Reviewed ?Radiology: ordered. ? ?Risk ?Prescription drug management. ? ? ?Patient presents due to right ankle pain.  Differential diagnosis includes but not limited to acute on chronic regional, chronic pain syndrome, cellulitis, septic joint, gout, sprain, fracture, dislocation, ischemic foot, retained foreign body. ? ?Patient's boyfriend is at bedside providing independent history. ? ?On exam patient has strong DP and PT, brisk cap refill.  Will not tolerate range, per chart review this is consistent with previous visit with Dr. Jess Barters office for orthopedic  follow-up.  Slight tachycardia but regular rhythm.   ? ?I considered laboratory work-up including blood cultures and lactic given she is tachycardic and has an elevated temperature although not febrile.  Patient technically does not meet SIRS criteria, discussed with my attending who evaluated the patient.  He is in agreement to abstain from laboratory work-up at this time to proceed with imaging. ? ?I did review external records including that visit as well as previous imaging of her foot and ankle from March of this year.  When compared to imaging today there is a new metallic foreign body. ? ?I ordered, viewed and interpreted the x-ray.  Notable for retained metallic foreign body consistent with physical exam. ? ?I ordered the patient fentanyl, Dilaudid and Ativan.  Also updated patient since tetanus shot.  I attempted to remove the foreign body, patient was able to tolerate lidocaine injection but did not tolerate initial attempt at incision.  Procedure was terminated abruptly secondary to patient's pain. ? ?I ordered the patient antibiotics, they will call orthopedic office Monday. ? ?Patient has outpatient follow-up  established already with orthopedics which is reassuring. ? ?Discussed HPI, physical exam and plan of care for this patient with attending Milton Ferguson. The attending physician evaluated this patient as part of a shared vis

## 2021-09-11 ENCOUNTER — Telehealth: Payer: Self-pay

## 2021-09-11 ENCOUNTER — Ambulatory Visit: Payer: Medicaid Other | Admitting: Orthopedic Surgery

## 2021-09-11 NOTE — Telephone Encounter (Signed)
Pt just called and said someone put in her chart that she "shots up".  She said she has never done that and when she questioned the ED lady that told her this they told her to contact her PCP.  She wants this taken out of her chart.  She wants a nurse or MD to call her back.  I glanced at the last 2 visits she had and did not see it.  Please call the patient.   ?

## 2021-09-11 NOTE — Telephone Encounter (Signed)
I have called patient and told her the [facility] does not see anything regarding that type of documentation in Dr Serita Grit note.  ?

## 2021-09-12 ENCOUNTER — Telehealth: Payer: Self-pay

## 2021-09-12 NOTE — Telephone Encounter (Signed)
Transition Care Management Unsuccessful Follow-up Telephone Call ? ?Date of discharge and from where:  09/10/2021 from [facility] ? ?Attempts:  1st Attempt ? ?Reason for unsuccessful TCM follow-up call:  Left voice message ? ? ? ?

## 2021-09-13 ENCOUNTER — Other Ambulatory Visit: Payer: Self-pay | Admitting: Obstetrics and Gynecology

## 2021-09-13 DIAGNOSIS — M25571 Pain in right ankle and joints of right foot: Secondary | ICD-10-CM | POA: Diagnosis not present

## 2021-09-13 NOTE — Telephone Encounter (Signed)
Transition Care Management Unsuccessful Follow-up Telephone Call ? ?Date of discharge and from where:  09/10/2021 from [facility] ? ?Attempts:  2nd Attempt ? ?Reason for unsuccessful TCM follow-up call:  Left voice message ? ? ? ?

## 2021-09-13 NOTE — Patient Instructions (Signed)
Hi Valerie Huber, thanks for speaking with me-I hope your surgery goes well in the morning. ? ?Valerie Huber was given information about Medicaid Managed Care team care coordination services as a part of their Healthy [facility] Medicaid benefit. Valerie Huber verbally consented to engagement with the [facility] Managed Care team.  ? ?If you are experiencing a medical emergency, please call 911 or report to your local emergency department or urgent care.  ? ?If you have a non-emergency medical problem during routine business hours, please contact your provider's office and ask to speak with a nurse.  ? ?For questions related to your Healthy [facility] health plan, please call: [phone] or visit the homepage here: [email or web address] ? ?If you would like to schedule transportation through your Healthy [facility] plan, please call the following number at least 2 days in advance of your appointment: [phone] ? For information about your ride after you set it up, call Ride Assist at [phone]. Use this number to activate a Will Call pickup, or if your transportation is late for a scheduled pickup. Use this number, too, if you need to make a change or cancel a previously scheduled reservation. ? If you need transportation services right away, call [phone]. The after-hours call center is staffed 24 hours to handle ride assistance and urgent reservation requests (including discharges) 365 days a year. Urgent trips include sick visits, hospital discharge requests and life-sustaining treatment. ? ?Call the [facility] at [phone], at any time, 24 hours a day, 7 days a week. If you are in danger or need immediate medical attention call 911. ? ?If you would like help to quit smoking, call 1-800-QUIT-NOW 620-631-4755) OR Espa?ol: 1-855-D?jelo-Ya 908-362-0731) o para m?s informaci?n haga clic aqu? or Text READY to 200-400 to register via text ? ?Ms.  Huber - following are the goals we discussed in your visit today:  ? Goals Addressed   ? ?Long-Range Goal: Establish Plan of Care  for Chronic Disease Management Needs   ?Start Date: 06/23/2021  ?Expected End Date: 12/21/2021  ?Priority: High  ?Note:   ?Timeframe:  Long-Range Goal ?Priority:  High ?Start Date:       06/23/21                      ?Expected End Date:   ongoing                   ? ?Follow Up Date: 10/14/21 ?  ?- practice safe sex ?- schedule appointment for flu shot ?- schedule appointment for vaccines needed due to my age or health ?- schedule recommended health tests (blood work, mammogram, colonoscopy, pap test) ?- schedule and keep appointment for annual check-up  ?  ?Why is this important?   ?Screening tests can find diseases early when they are easier to treat.  ?Your doctor or nurse will talk with you about which tests are important for you.  ?Getting shots for common diseases like the flu and shingles will help prevent them.   ? 09/13/21:  Patient to have foot surgery in the morning-has sleep study scheduled for 09/18/21  ? ?Patient verbalizes understanding of instructions and care plan provided today and agrees to view in [place]. Active MyChart status confirmed with patient.   ? ?The Managed Medicaid care management team will reach out to the patient again over the next 30 days.  ?The  Patient has been provided with contact information for the Managed Medicaid care management team and  has been advised to call with any health related questions or concerns.  ? ?Valerie Raider RN, BSN ?[facility] ?Care Management Coordinator - Managed Medicaid High Risk ?[phone] ?  ?Following is a copy of your plan of care:  ?Care Plan : [place] of Care  ?Updates made by Gayla Medicus, RN since 09/13/2021 12:00 AM  ?  ? ?Problem: Chronic Disease Management and Care Coordiantion Needs   ?Priority: High  ?  ? ?Goal: Self-Management Plan Developed   ?Start Date: 06/23/2021   ?Expected End Date: 12/21/2021  ?Recent Progress: Not on track  ?Priority: High  ?Note:   ? ?Current Barriers:  ?Knowledge Deficits related to plan of care for management of right foot numbness and blurry vision ?Care Coordination needs related to right foot numbness and blurry vision. ?09/13/21:  patient states blurry vision has improved.  To have foot surgery in the morning, sleep study 09/18/21. ? ?RNCM Clinical Goal(s):  ?Patient will verbalize understanding of plan for management of right foot numbness and blurry vision as evidenced by contacting provider regarding appointment ?verbalize basic understanding of  evaluation of  right foot numbness,  blurry vision and self health management plan as evidenced by scheduling an appointment with provider  ?take all medications exactly as prescribed and will call provider for medication related questions ?demonstrate understanding of rationale for each prescribed medication ?attend all scheduled medical appointments ?continue to work with RN Care Manager to address care management and care coordination needs related right foot numbness and blurry vision  as evidenced by adherence to CM Team Scheduled appointments through collaboration with RN Care manager, provider, and care team.  ? ?Interventions: ?Inter-disciplinary care team collaboration (see longitudinal plan of care) ?Evaluation of current treatment plan related to  self management and patient's adherence to plan as established by provider ? ?  (Status:  New goal.)  Long Term Goal ?Evaluation of current treatment plan related to right foot numbness and blurry vision ?Discussed plans with patient for ongoing care management follow up and provided patient with direct contact information for care management team ?Evaluation of current treatment plan related to right foot numbness, blurry vision  and patient's adherence to plan as established by provider ?Advised patient to contact provider regarding  appointment ?Reviewed medications with patient ?Reviewed scheduled/upcoming provider appointments  ?Discussed plans with patient for ongoing care management follow up and provided patient with direct contact information for care management team ?Assessed social determinant of health barriers ?Patient provided with phone number for Dr. Zada Finders to schedule an appt for right foot numbness.  Also discussed scheduling an appt with eye provider for evaluation of blurry vision-patient states she has an eye provider she can see in [place]. ? ?Patient Goals/Self-Care Activities: ?Take all medications as prescribed ?Attend all scheduled provider appointments ?Call pharmacy for medication refills 3-7 days in advance of running out of medications ?Perform all self care activities independently  ?Perform IADL's (shopping, preparing meals, housekeeping, managing finances) independently ?Call provider office for new concerns or questions  ? ?Follow Up Plan:  The patient has been provided with contact information for the care management team and has been advised to call with any health related questions or concerns.  ?The care management team will reach out to the patient again over the next 30 days.   ? ? ?  ?

## 2021-09-13 NOTE — Patient Outreach (Signed)
?Medicaid Managed Care   ?Nurse Care Manager Note ? ?09/13/2021 ?Name:  Valerie Huber MRN:  [id number] DOB:  [birth year] ? ?Valerie Huber is an 36 y.o. year old female who is a primary patient of Lindell Spar, MD.  The [facility] Managed Care Coordination team was consulted for assistance with:    ?Healthcare management needs, anxiety/depression, right foot pain , OSA, GERD, h/o endometriosis ? ?Valerie Huber was given information about Medicaid Managed Care Coordination team services today. Valerie Huber Patient agreed to services and verbal consent obtained. ? ?Engaged with patient by telephone for follow up visit in response to provider referral for case management and/or care coordination services.  ? ?Assessments/Interventions:  Review of past medical history, allergies, medications, health status, including review of consultants reports, laboratory and other test data, was performed as part of comprehensive evaluation and provision of chronic care management services. ? ?SDOH (Social Determinants of Health) assessments and interventions performed: ?SDOH Interventions   ? ?Flowsheet Row Most Recent Value  ?SDOH Interventions   ?Financial Strain Interventions Intervention Not Indicated  ?Stress Interventions Other (Comment)  Valerie Huber is due to having surgery tomorrow-will follow up]  ? ?  ?Care Plan ? ?Allergies  ?Allergen Reactions  ? Flexeril [Cyclobenzaprine] Hives  ? Ibuprofen Anaphylaxis  ?  Throat, eyes, lips swollen  ? ?Medications Reviewed Today   ? ? Reviewed by Gayla Medicus, RN (Registered Nurse) on 09/13/21 at 1532  Med List Status: <None>  ? ?Medication Order Taking? Sig Documenting Provider Last Dose Status Informant  ?amitriptyline (ELAVIL) 25 MG tablet [id number]  Take 1 tablet (25 mg total) by mouth at bedtime. Newt Minion, MD  Active   ?cephALEXin (KEFLEX) 500 MG capsule [id number]  Take 1 capsule (500 mg total) by mouth 4 (four) times daily. Sherrill Raring, PA-C  Active   ?gabapentin  (NEURONTIN) 300 MG capsule [id number] No Take 1 capsule (300 mg total) by mouth 3 (three) times daily. 3 times a day when necessary neuropathy pain  ?Patient not taking: Reported on 09/13/2021  ? Newt Minion, MD Not Taking Active   ?omeprazole (PRILOSEC) 40 MG capsule [id number]  Take 40 mg by mouth daily. [provider]  Active   ?ondansetron (ZOFRAN) 4 MG tablet [id number]  Take 1 tablet (4 mg total) by mouth every 6 (six) hours. Sherrill Raring, PA-C  Active   ?oxyCODONE-acetaminophen (PERCOCET/ROXICET) 5-325 MG tablet [id number]  Take 1 tablet by mouth every 6 (six) hours as needed for severe pain. Sherrill Raring, PA-C  Active   ? ?  ?  ? ?  ? ?Patient Active Problem List  ? Diagnosis Date Noted  ? Gastroesophageal reflux disease without esophagitis 09/07/2021  ? OSA (obstructive sleep apnea) 09/07/2021  ? Dyspareunia, female   ? History of endometriosis   ? Chest pain 11/07/2020  ? Seasonal allergic rhinitis due to pollen 09/01/2020  ? Encounter for examination following treatment at hospital 04/12/2020  ? Chronic diarrhea 06/30/2019  ? Rectal bleeding   ? Complex regional pain syndrome type 1 of right lower extremity 05/20/2017  ? Depression 11/30/2015  ? H/O: substance abuse ([place]) 11/30/2015  ? History of seizures 11/30/2015  ? Marijuana use 07/05/2015  ? 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/21/2014  ? Anaphylaxis 09/04/2011  ? ?Conditions to be addressed/monitored per PCP order:  Healthcare management needs, anxiety/depression, right foot pain, OSA, GERD, h/o endometriosis ? ?Care Plan : RN Care Manager Plan of Care  ?Updates made by Gayla Medicus, RN since  09/13/2021 12:00 AM  ?  ? ?Problem: Chronic Disease Management and Care Coordiantion Needs   ?Priority: High  ?  ? ?Goal: Self-Management Plan Developed   ?Start Date: 06/23/2021  ?Expected End Date: 12/21/2021  ?Recent Progress: Not on track  ?Priority: High  ?Note:   ? ?Current Barriers:  ?Knowledge Deficits related to plan of care for management of  right foot numbness and blurry vision ?Care Coordination needs related to right foot numbness and blurry vision. ?09/13/21:  patient states blurry vision has improved.  To have foot surgery in the morning, sleep study 09/18/21. ? ?RNCM Clinical Goal(s):  ?Patient will verbalize understanding of plan for management of right foot numbness and blurry vision as evidenced by contacting provider regarding appointment ?verbalize basic understanding of  evaluation of  right foot numbness,  blurry vision and self health management plan as evidenced by scheduling an appointment with provider  ?take all medications exactly as prescribed and will call provider for medication related questions ?demonstrate understanding of rationale for each prescribed medication ?attend all scheduled medical appointments ?continue to work with RN Care Manager to address care management and care coordination needs related right foot numbness and blurry vision  as evidenced by adherence to CM Team Scheduled appointments through collaboration with RN Care manager, provider, and care team.  ? ?Interventions: ?Inter-disciplinary care team collaboration (see longitudinal plan of care) ?Evaluation of current treatment plan related to  self management and patient's adherence to plan as established by provider ? ?  (Status:  New goal.)  Long Term Goal ?Evaluation of current treatment plan related to right foot numbness and blurry vision ?Discussed plans with patient for ongoing care management follow up and provided patient with direct contact information for care management team ?Evaluation of current treatment plan related to right foot numbness, blurry vision  and patient's adherence to plan as established by provider ?Advised patient to contact provider regarding appointment ?Reviewed medications with patient ?Reviewed scheduled/upcoming provider appointments  ?Discussed plans with patient for ongoing care management follow up and provided patient with  direct contact information for care management team ?Assessed social determinant of health barriers ?Patient provided with phone number for Dr. Zada Finders to schedule an appt for right foot numbness.  Also discussed scheduling an appt with eye provider for evaluation of blurry vision-patient states she has an eye provider she can see in [place]. ? ?Patient Goals/Self-Care Activities: ?Take all medications as prescribed ?Attend all scheduled provider appointments ?Call pharmacy for medication refills 3-7 days in advance of running out of medications ?Perform all self care activities independently  ?Perform IADL's (shopping, preparing meals, housekeeping, managing finances) independently ?Call provider office for new concerns or questions  ? ?Follow Up Plan:  The patient has been provided with contact information for the care management team and has been advised to call with any health related questions or concerns.  ?The care management team will reach out to the patient again over the next 30 days.   ? ?Long-Range Goal: Establish Plan of Care  for Chronic Disease Management Needs   ?Start Date: 06/23/2021  ?Expected End Date: 12/21/2021  ?Priority: High  ?Note:   ?Timeframe:  Long-Range Goal ?Priority:  High ?Start Date:       06/23/21                      ?Expected End Date:   ongoing                   ? ?  Follow Up Date: 10/14/21 ?  ?- practice safe sex ?- schedule appointment for flu shot ?- schedule appointment for vaccines needed due to my age or health ?- schedule recommended health tests (blood work, mammogram, colonoscopy, pap test) ?- schedule and keep appointment for annual check-up  ?  ?Why is this important?   ?Screening tests can find diseases early when they are easier to treat.  ?Your doctor or nurse will talk with you about which tests are important for you.  ?Getting shots for common diseases like the flu and shingles will help prevent them.   ? 09/13/21:  Patient to have foot surgery in the morning-has  sleep study scheduled for 09/18/21.  ? ?Follow Up:  Patient agrees to Care Plan and Follow-up. ? ?Plan: The Managed Medicaid care management team will reach out to the patient again over the next 30 days. and The

## 2021-09-14 DIAGNOSIS — G8918 Other acute postprocedural pain: Secondary | ICD-10-CM | POA: Diagnosis not present

## 2021-09-14 DIAGNOSIS — M795 Residual foreign body in soft tissue: Secondary | ICD-10-CM | POA: Diagnosis not present

## 2021-09-14 DIAGNOSIS — S91041A Puncture wound with foreign body, right ankle, initial encounter: Secondary | ICD-10-CM | POA: Diagnosis not present

## 2021-09-14 HISTORY — PX: ANKLE SURGERY: SHX546

## 2021-09-14 NOTE — Telephone Encounter (Signed)
Transition Care Management Unsuccessful Follow-up Telephone Call ? ?Date of discharge and from where:  09/10/2021-[facility] ? ?Attempts:  3rd Attempt ? ?Reason for unsuccessful TCM follow-up call:  Left voice message ? ?  ?

## 2021-09-15 ENCOUNTER — Other Ambulatory Visit: Payer: Self-pay | Admitting: Internal Medicine

## 2021-09-15 DIAGNOSIS — F332 Major depressive disorder, recurrent severe without psychotic features: Secondary | ICD-10-CM | POA: Diagnosis not present

## 2021-09-15 DIAGNOSIS — F4312 Post-traumatic stress disorder, chronic: Secondary | ICD-10-CM | POA: Diagnosis not present

## 2021-09-15 DIAGNOSIS — F4002 Agoraphobia without panic disorder: Secondary | ICD-10-CM | POA: Diagnosis not present

## 2021-09-15 DIAGNOSIS — R102 Pelvic and perineal pain: Secondary | ICD-10-CM

## 2021-09-18 NOTE — Telephone Encounter (Addendum)
Patient lvm for me to call back.  ? ?Patient contacted and she stated that a nurse that came into her room and asked patient if she "shoots up"? Patient is upset that she was asked that and the way that she was asked that question.  ? ?Patient stated that she is concerned that she was stereotyped and is offended that she was asked that question. Patient is very unhappy and concerned that the nurse would ask that question.  ? ? ?Safety Zone Incident entered:  ?You have successfully saved event [phone] ?

## 2021-09-25 DIAGNOSIS — G4719 Other hypersomnia: Secondary | ICD-10-CM | POA: Diagnosis not present

## 2021-09-25 DIAGNOSIS — E669 Obesity, unspecified: Secondary | ICD-10-CM | POA: Diagnosis not present

## 2021-09-25 DIAGNOSIS — F5104 Psychophysiologic insomnia: Secondary | ICD-10-CM | POA: Diagnosis not present

## 2021-09-25 DIAGNOSIS — F419 Anxiety disorder, unspecified: Secondary | ICD-10-CM | POA: Diagnosis not present

## 2021-09-27 ENCOUNTER — Telehealth: Payer: Self-pay | Admitting: Physical Medicine and Rehabilitation

## 2021-09-27 NOTE — Telephone Encounter (Signed)
Pt called no cancel Dr Ernestina Patches appt. No need for call back just cancel. ?

## 2021-09-29 DIAGNOSIS — F4310 Post-traumatic stress disorder, unspecified: Secondary | ICD-10-CM | POA: Diagnosis not present

## 2021-09-29 DIAGNOSIS — F411 Generalized anxiety disorder: Secondary | ICD-10-CM | POA: Diagnosis not present

## 2021-10-02 ENCOUNTER — Encounter: Payer: Medicaid Other | Admitting: Physical Medicine and Rehabilitation

## 2021-10-05 ENCOUNTER — Telehealth: Payer: Self-pay | Admitting: Internal Medicine

## 2021-10-05 NOTE — Telephone Encounter (Signed)
Pt called stating she is having pain in her rt shoulder blade and is wanting to know when she should be concerned about this?

## 2021-10-05 NOTE — Telephone Encounter (Signed)
Please schedule pt in office appt

## 2021-10-16 ENCOUNTER — Other Ambulatory Visit: Payer: Self-pay | Admitting: Obstetrics and Gynecology

## 2021-10-16 NOTE — Patient Outreach (Signed)
Care Coordination  10/16/2021  Valerie Huber 1986-04-05 [id number]   Medicaid Managed Care   Unsuccessful Outreach Note  10/16/2021 Name: Valerie Huber MRN: [id number] DOB: 1985/07/18  Referred by: Lindell Spar, MD Reason for referral : High Risk Managed Medicaid (Unsuccessful telephone outreach)   An unsuccessful telephone outreach was attempted today. The patient was referred to the case management team for assistance with care management and care coordination.   Follow Up Plan: The care management team will reach out to the patient again over the next 30 business  days.   Aida Raider RN, BSN [place]  [facility] - Managed Medicaid High Risk [phone]

## 2021-10-16 NOTE — Patient Instructions (Signed)
Hi Ms. Therien, I am sorry I was unable to reach you today-I hope you are doing okay- as a part of your Medicaid benefit, you are eligible for care management and care coordination services at no cost or copay. I was unable to reach you by phone today but would be happy to help you with your health related needs. Please feel free to call me at [phone].  A member of the Managed Medicaid care management team will reach out to you again over the next 30 business days.   Aida Raider RN, BSN [place]  [facility] - Managed Medicaid High Risk [phone]

## 2021-10-17 DIAGNOSIS — E669 Obesity, unspecified: Secondary | ICD-10-CM | POA: Diagnosis not present

## 2021-10-17 DIAGNOSIS — G4719 Other hypersomnia: Secondary | ICD-10-CM | POA: Diagnosis not present

## 2021-10-17 DIAGNOSIS — F419 Anxiety disorder, unspecified: Secondary | ICD-10-CM | POA: Diagnosis not present

## 2021-11-01 ENCOUNTER — Other Ambulatory Visit: Payer: Self-pay | Admitting: Internal Medicine

## 2021-11-01 DIAGNOSIS — K920 Hematemesis: Secondary | ICD-10-CM

## 2021-11-15 ENCOUNTER — Other Ambulatory Visit: Payer: Self-pay | Admitting: Obstetrics and Gynecology

## 2021-11-15 NOTE — Patient Outreach (Signed)
Care Coordination  11/15/2021  [name] 1985-09-23 [id number]   Medicaid Managed Care   Unsuccessful Outreach Note  11/15/2021 Name: Valerie Huber MRN: [id number] DOB: [birth year]  Referred by: Lindell Spar, MD Reason for referral : High Risk Managed Medicaid (Unsuccessful telephone outreach)   A second unsuccessful telephone outreach was attempted today. The patient was referred to the case management team for assistance with care management and care coordination.   Follow Up Plan: The care management team will reach out to the patient again over the next 30 business  days.   Aida Raider RN, BSN [place]  [facility] - Managed Medicaid High Risk [phone]

## 2021-11-15 NOTE — Patient Instructions (Signed)
Hi Ms. Redel-  - as a part of your Medicaid benefit, you are eligible for care management and care coordination services at no cost or copay. I was unable to reach you by phone today but would be happy to help you with your health related needs. Please feel free to call me at [phone].  A member of the Managed Medicaid care management team will reach out to you again over the next 30 business days.   Aida Raider RN, BSN [place]  [facility] - Managed Medicaid High Risk [phone]

## 2021-11-30 ENCOUNTER — Telehealth: Payer: Self-pay | Admitting: Orthopaedic Surgery

## 2021-11-30 NOTE — Telephone Encounter (Signed)
Patient came to office with EOB from [facility] - copy has been made and placed in clinic assistant box - regarding her ankle brace per her office visit on 08/15/21 with Dr Luna Glasgow, from [place]. Patient is concerned as [employer] has denied the brace. I relayed it may be the additional medical necessity form for the type of coverage which may be in progress. Please advise.

## 2021-12-01 DIAGNOSIS — F4310 Post-traumatic stress disorder, unspecified: Secondary | ICD-10-CM | POA: Diagnosis not present

## 2021-12-01 DIAGNOSIS — F411 Generalized anxiety disorder: Secondary | ICD-10-CM | POA: Diagnosis not present

## 2021-12-06 DIAGNOSIS — M25571 Pain in right ankle and joints of right foot: Secondary | ICD-10-CM | POA: Diagnosis not present

## 2021-12-08 DIAGNOSIS — F411 Generalized anxiety disorder: Secondary | ICD-10-CM | POA: Diagnosis not present

## 2021-12-08 DIAGNOSIS — F4310 Post-traumatic stress disorder, unspecified: Secondary | ICD-10-CM | POA: Diagnosis not present

## 2021-12-14 ENCOUNTER — Encounter: Payer: Self-pay | Admitting: Adult Health

## 2021-12-14 ENCOUNTER — Other Ambulatory Visit (HOSPITAL_COMMUNITY): Payer: Self-pay | Admitting: Adult Health

## 2021-12-14 ENCOUNTER — Ambulatory Visit: Payer: Medicaid Other | Admitting: Adult Health

## 2021-12-14 VITALS — BP 121/85 | HR 75 | Ht 64.0 in | Wt 185.0 lb

## 2021-12-14 DIAGNOSIS — N6321 Unspecified lump in the left breast, upper outer quadrant: Secondary | ICD-10-CM | POA: Insufficient documentation

## 2021-12-14 DIAGNOSIS — N644 Mastodynia: Secondary | ICD-10-CM

## 2021-12-14 NOTE — Progress Notes (Signed)
  Subjective:     Patient ID: Valerie Huber, female   DOB: Jul 23, 1985, 36 y.o.   MRN: [id number]  HPI Valerie Huber is a 36 year old black female,single, sp hysterectomy ,in complaining of left tenderness/pain for 3 weeks. No known injury. PCP is Dr Posey Pronto.  Review of Systems Left breast tenderness/pain Reviewed past medical,surgical, social and family history. Reviewed medications and allergies.     Objective:   Physical Exam BP 121/85 (BP Location: Left Arm, Patient Position: Sitting, Cuff Size: Normal)   Pulse 75   Ht '5\' 4"'$  (1.626 m)   Wt 185 lb (83.9 kg)   LMP 12/08/2020   BMI 31.76 kg/m      Skin warm and dry,  Breasts:no dominate palpable mass, retraction or nipple discharge on the right, nipple slightly inverted, on left, no retraction or nipple discharge, and left nipple slightly inverted, they have always been that way, has 2 cm mass and tenderness at 2 0'clock 3 FB from areola . Fall risk is high  [facility] - 12/14/21 0925       Pregnancy Intention Screening   Does the patient want to become pregnant in the next year? No    Does the patient's partner want to become pregnant in the next year? No    Would the patient like to discuss contraceptive options today? No      Contraception Wrap Up   Current Method Female Sterilization   hyst   End Method Female Sterilization   hyst            Assessment:     1. Pain of left breast Diagnostic bilateral mammogram and right and left [place] scheduled for [name] 12/26/21 at 10 am. Can take tylenol and use ice as needed Try not keep touching the tender area   2. Mass of upper outer quadrant of left breast Diagnostic bilateral mammogram and right and left [place] scheduled for [name] 12/26/21 at 10 am.    Plan:     Follow up prn

## 2021-12-18 DIAGNOSIS — M25571 Pain in right ankle and joints of right foot: Secondary | ICD-10-CM | POA: Diagnosis not present

## 2021-12-20 ENCOUNTER — Ambulatory Visit: Payer: Self-pay

## 2021-12-20 ENCOUNTER — Other Ambulatory Visit: Payer: Self-pay | Admitting: Obstetrics and Gynecology

## 2021-12-20 NOTE — Patient Outreach (Signed)
Care Coordination  12/20/2021  Jourden Delmont May 25, 1985 [id number]   Medicaid Managed Care   Unsuccessful Outreach Note  12/20/2021 Name: Jordyn Hofacker MRN: [id number] DOB: [birth year]  Referred by: Lindell Spar, MD Reason for referral : High Risk Managed Medicaid (Unsuccessful telephone outreach)   Third unsuccessful telephone outreach was attempted today. The patient was referred to the case management team for assistance with care management and care coordination. The patient's primary care provider has been notified of our unsuccessful attempts to make or maintain contact with the patient. The care management team is pleased to engage with this patient at any time in the future should he/she be interested in assistance from the care management team.   Follow Up Plan: We have been unable to make contact with the patient for follow up. The care management team is available to follow up with the patient after provider conversation with the patient regarding recommendation for care management engagement and subsequent re-referral to the care management team.   Aida Raider RN, BSN [place] Management Coordinator - Managed [place] High Risk [phone]

## 2021-12-20 NOTE — Patient Instructions (Signed)
Visit Information  Ms. Valerie Huber  - as a part of your Medicaid benefit, you are eligible for care management and care coordination services at no cost or copay. I was unable to reach you by phone today but would be happy to help you with your health related needs. Please feel free to call me at [phone].   Aida Raider RN, BSN [place]  [facility] - Managed Medicaid High Risk [phone]

## 2021-12-25 DIAGNOSIS — M25571 Pain in right ankle and joints of right foot: Secondary | ICD-10-CM | POA: Diagnosis not present

## 2021-12-25 DIAGNOSIS — S91041A Puncture wound with foreign body, right ankle, initial encounter: Secondary | ICD-10-CM | POA: Diagnosis not present

## 2021-12-25 DIAGNOSIS — M79671 Pain in right foot: Secondary | ICD-10-CM | POA: Diagnosis not present

## 2021-12-26 ENCOUNTER — Ambulatory Visit (HOSPITAL_COMMUNITY)
Admission: RE | Admit: 2021-12-26 | Discharge: 2021-12-26 | Disposition: A | Discharge status: Home / Self Care | Payer: Medicaid Other | Source: Ambulatory Visit | Attending: Adult Health | Admitting: Adult Health

## 2021-12-26 DIAGNOSIS — N6321 Unspecified lump in the left breast, upper outer quadrant: Secondary | ICD-10-CM | POA: Insufficient documentation

## 2021-12-26 DIAGNOSIS — N644 Mastodynia: Secondary | ICD-10-CM

## 2021-12-29 DIAGNOSIS — F411 Generalized anxiety disorder: Secondary | ICD-10-CM | POA: Diagnosis not present

## 2021-12-29 DIAGNOSIS — F4310 Post-traumatic stress disorder, unspecified: Secondary | ICD-10-CM | POA: Diagnosis not present

## 2021-12-29 DIAGNOSIS — M25572 Pain in left ankle and joints of left foot: Secondary | ICD-10-CM | POA: Diagnosis not present

## 2021-12-29 DIAGNOSIS — M25571 Pain in right ankle and joints of right foot: Secondary | ICD-10-CM | POA: Diagnosis not present

## 2022-01-02 ENCOUNTER — Emergency Department (HOSPITAL_COMMUNITY): Payer: Medicaid Other

## 2022-01-02 ENCOUNTER — Other Ambulatory Visit: Payer: Self-pay

## 2022-01-02 ENCOUNTER — Encounter (HOSPITAL_COMMUNITY): Payer: Self-pay

## 2022-01-02 ENCOUNTER — Emergency Department (HOSPITAL_COMMUNITY)
Admission: EM | Admit: 2022-01-02 | Discharge: 2022-01-03 | Disposition: A | Discharge status: Home / Self Care | Payer: Medicaid Other | Attending: Emergency Medicine | Admitting: Emergency Medicine

## 2022-01-02 DIAGNOSIS — M25571 Pain in right ankle and joints of right foot: Secondary | ICD-10-CM

## 2022-01-02 DIAGNOSIS — L03115 Cellulitis of right lower limb: Secondary | ICD-10-CM | POA: Insufficient documentation

## 2022-01-02 DIAGNOSIS — M7989 Other specified soft tissue disorders: Secondary | ICD-10-CM | POA: Diagnosis not present

## 2022-01-02 DIAGNOSIS — Q899 Congenital malformation, unspecified: Secondary | ICD-10-CM | POA: Diagnosis not present

## 2022-01-02 LAB — CBC WITH DIFFERENTIAL/PLATELET
Abs Immature Granulocytes: 0.01 10*3/uL (ref 0.00–0.07)
Basophils Absolute: 0 10*3/uL (ref 0.0–0.1)
Basophils Relative: 0 %
Eosinophils Absolute: 0.1 10*3/uL (ref 0.0–0.5)
Eosinophils Relative: 1 %
HCT: 37.2 % (ref 36.0–46.0)
Hemoglobin: 12.2 g/dL (ref 12.0–15.0)
Immature Granulocytes: 0 %
Lymphocytes Relative: 54 %
Lymphs Abs: 4.1 10*3/uL — ABNORMAL HIGH (ref 0.7–4.0)
MCH: 31.3 pg (ref 26.0–34.0)
MCHC: 32.8 g/dL (ref 30.0–36.0)
MCV: 95.4 fL (ref 80.0–100.0)
Monocytes Absolute: 0.5 10*3/uL (ref 0.1–1.0)
Monocytes Relative: 6 %
Neutro Abs: 3 10*3/uL (ref 1.7–7.7)
Neutrophils Relative %: 39 %
Platelets: 292 10*3/uL (ref 150–400)
RBC: 3.9 MIL/uL (ref 3.87–5.11)
RDW: 12.2 % (ref 11.5–15.5)
WBC: 7.7 10*3/uL (ref 4.0–10.5)
nRBC: 0 % (ref 0.0–0.2)

## 2022-01-02 LAB — BASIC METABOLIC PANEL
Anion gap: 7 (ref 5–15)
BUN: 11 mg/dL (ref 6–20)
CO2: 26 mmol/L (ref 22–32)
Calcium: 9.4 mg/dL (ref 8.9–10.3)
Chloride: 106 mmol/L (ref 98–111)
Creatinine, Ser: 0.77 mg/dL (ref 0.44–1.00)
GFR, Estimated: >60 mL/min (ref >60)
Glucose, Bld: 97 mg/dL (ref 70–99)
Potassium: 3.4 mmol/L — ABNORMAL LOW (ref 3.5–5.1)
Sodium: 139 mmol/L (ref 135–145)

## 2022-01-02 LAB — LACTIC ACID, PLASMA: Lactic Acid, Venous: 0.8 mmol/L (ref 0.5–1.9)

## 2022-01-02 MED ORDER — ONDANSETRON HCL 4 MG/2ML IJ SOLN
4.0000 mg | Freq: Once | INTRAMUSCULAR | Status: AC
  Administered 2022-01-02: 4 mg via INTRAVENOUS
  Filled 2022-01-02: qty 2

## 2022-01-02 MED ORDER — MORPHINE SULFATE (PF) 4 MG/ML IV SOLN
4.0000 mg | Freq: Once | INTRAVENOUS | Status: AC
  Administered 2022-01-02: 4 mg via INTRAVENOUS
  Filled 2022-01-02: qty 1

## 2022-01-02 MED ORDER — SODIUM CHLORIDE 0.9 % IV SOLN
1.0000 g | INTRAVENOUS | Status: DC
  Administered 2022-01-02: 1 g via INTRAVENOUS
  Filled 2022-01-02: qty 10

## 2022-01-02 MED ORDER — MORPHINE SULFATE (PF) 4 MG/ML IV SOLN
2.0000 mg | Freq: Once | INTRAVENOUS | Status: AC
  Administered 2022-01-02: 2 mg via INTRAVENOUS
  Filled 2022-01-02: qty 1

## 2022-01-02 MED ORDER — OXYCODONE-ACETAMINOPHEN 5-325 MG PO TABS: 1.0000 | ORAL_TABLET | Freq: Three times a day (TID) | ORAL | 0 refills | Status: AC | PRN

## 2022-01-02 NOTE — ED Provider Notes (Signed)
[facility] EMERGENCY DEPARTMENT Provider Note   CSN: [id number] Arrival date & time: 01/02/22  1946     History  Chief Complaint  Patient presents with   Ankle Pain    Valerie Huber is a 36 y.o. female with a medical history of foreign body in soft tissue of right ankle patient had surgery on 09/14/2021 performed by Dr. Lucia Gaskins to remove foreign body.  Patient has been following up with Dr. Lucia Gaskins since her surgery.  Presents to the emergency department with a chief complaint of right ankle pain.  Patient reports that she has had progressively worsening ankle pain and swelling since being evaluated by Dr. Quita Skye in the outpatient setting on 12/25/2021.  At that time patient was started on antibiotic Bactrim due to swelling and erythema.  Patient reports that her swelling and erythema has gone progressively worse.  Patient states that she is having difficulty with range of motion due to pain as well as weightbearing.  Patient denies any recent falls or injuries.  Endorses some tingling sensation to right great toe.  Patient has been taking antibiotic as prescribed without any missed doses.  Denies any fever, chills, nausea, vomiting, pallor, wound.   Ankle Pain Associated symptoms: no back pain, no fever and no neck pain        Home Medications Prior to Admission medications   Medication Sig Start Date End Date Taking? Authorizing Provider  pantoprazole (PROTONIX) 40 MG tablet TAKE 1 TABLET(40 MG) BY MOUTH DAILY 11/01/21   Lindell Spar, MD  traMADol (ULTRAM) 50 MG tablet Take 50 mg by mouth every 6 (six) hours as needed. 12/11/21   [provider]      Allergies    Flexeril [cyclobenzaprine] and Ibuprofen    Review of Systems   Review of Systems  Constitutional:  Negative for chills and fever.  Gastrointestinal:  Negative for nausea and vomiting.  Musculoskeletal:  Positive for arthralgias and joint swelling. Negative for back pain and neck pain.  Skin:  Positive for  color change. Negative for rash and wound.  Neurological:  Negative for weakness and numbness.  Psychiatric/Behavioral:  Negative for confusion.     Physical Exam Updated Vital Signs BP (!) 145/92 (BP Location: Right Arm)   Pulse (!) 109   Temp 99.8 F (37.7 C) (Oral)   Resp 17   Ht '5\' 4"'$  (1.626 m)   Wt 83.9 kg   LMP 12/08/2020   SpO2 99%   BMI 31.76 kg/m  Physical Exam Vitals and nursing note reviewed.  Constitutional:      General: She is not in acute distress.    Appearance: She is not ill-appearing, toxic-appearing or diaphoretic.  HENT:     Head: Normocephalic.  Eyes:     General: No scleral icterus.       Right eye: No discharge.        Left eye: No discharge.  Cardiovascular:     Rate and Rhythm: Normal rate.     Pulses:          Dorsalis pedis pulses are 2+ on the right side and 2+ on the left side.  Pulmonary:     Effort: Pulmonary effort is normal.  Musculoskeletal:     Right lower leg: Normal.     Left lower leg: Normal.     Right ankle: Swelling present. No deformity, ecchymosis or lacerations. Tenderness present over the lateral malleolus. Decreased range of motion.     Left ankle: No swelling, deformity,  ecchymosis or lacerations. No tenderness. Normal range of motion.     Right foot: Normal range of motion and normal capillary refill. Swelling, tenderness and bony tenderness present. No deformity or laceration. Normal pulse.     Left foot: Normal range of motion and normal capillary refill. No swelling, deformity, laceration, tenderness or bony tenderness. Normal pulse.     Comments: Swelling, erythema, and warmth to right lateral malleus.  Patient has decreased range of motion to right ankle secondary to complaints of pain.  Erythema and swelling extends below right lateral malleus into patient's foot.  Sensation intact to all digits of right foot, cap refill less than 2 seconds in all digits of right foot, range of motion intact to all digits of right foot.   Skin:    General: Skin is warm and dry.  Neurological:     General: No focal deficit present.     Mental Status: She is alert.  Psychiatric:        Behavior: Behavior is cooperative.     ED Results / Procedures / Treatments   Labs (all labs ordered are listed, but only abnormal results are displayed) Labs Reviewed  BASIC METABOLIC PANEL - Abnormal; Notable for the following components:      Result Value   Potassium 3.4 (*)    All other components within normal limits  CBC WITH DIFFERENTIAL/PLATELET - Abnormal; Notable for the following components:   Lymphs Abs 4.1 (*)    All other components within normal limits  LACTIC ACID, PLASMA    EKG None  Radiology DG Ankle Complete Right  Result Date: 01/02/2022 CLINICAL DATA:  Right ankle swelling. EXAM: RIGHT ANKLE - COMPLETE 3+ VIEW COMPARISON:  Right ankle radiograph dated 12/06/2021. FINDINGS: No acute fracture or dislocation. 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 There is associated moderate spurring and arthritic changes. The ankle mortise is intact. Mild soft tissue swelling over the lateral ankle. No acute foreign object or soft tissue gas. IMPRESSION: 1. No acute fracture or dislocation. 2. 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 Electronically Signed   By: Anner Crete M.D.   On: 01/02/2022 21:12    Procedures Procedures    Medications Ordered in ED Medications  cefTRIAXone (ROCEPHIN) 1 g in sodium chloride 0.9 % 100 mL IVPB (has no administration in time range)  morphine (PF) 4 MG/ML injection 2 mg (has no administration in time range)  morphine (PF) 4 MG/ML injection 4 mg (4 mg Intravenous Given 01/02/22 2131)  ondansetron (ZOFRAN) injection 4 mg (4 mg Intravenous Given 01/02/22 2130)    ED Course/ Medical Decision Making/ A&P Clinical Course as of 01/02/22 2245  Tue  Jan 02, 2022  2240 I spoke to Dr. Berenice Primas with [facility] who advised you give patient 1 g of ceftriaxone in the emergency department.  Patient will follow-up with Dr. Lucia Gaskins first thing tomorrow morning for repeat evaluation. [PB]    Clinical Course User Index [PB] Loni Beckwith, PA-C                           Medical Decision Making Amount and/or Complexity of Data Reviewed Labs: ordered. Radiology: ordered.  Risk Prescription drug management.   Alert 36 year old female in no acute distress, nontoxic-appearing.  Presents to the emergency department with a chief complaint of right ankle pain and swelling.  Information was obtained from patient.  I reviewed patient's past medical records including previous phone notes, labs, and imaging.  Patient has medical history as outlined in HPI which complicates her care.  Per chart review patient was taken to surgery on 09/14/2021 by Dr. Wilmer Floor for foreign body removal.  Patient is swelling, erythema, and warmth, currently being treated for infection with Bactrim.  Will obtain x-ray imaging, lactic acid, BMP and CBC.  Pain management with morphine.  Patient does have decreased range of motion however it is unclear if it is due to inability to move ankle or secondary to pain.  I personally viewed interpret patient's x-ray imaging.  Agree with radiology interpretation of: -No acute fracture or dislocation.  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  I personally viewed interpret patient's lab results.  Pertinent findings include an: -Lactic acid within normal limits -No leukocytosis -No large electrolyte abnormality.  I spoke with Dr. Berenice Primas with [facility].  Please see above note for further information on our conversation.  Patient has improvement in pain after occasions in the emergency department.  Will prescribe patient with short course of pain medicine.  Patient advised to continue Bactrim.   Patient advised to follow-up with her orthopedic doctor first thing tomorrow morning.  Based on patient's chief complaint, I considered admission might be necessary, however after reassuring ED workup feel patient is reasonable for discharge.  Discussed results, findings, treatment and follow up. Patient advised of return precautions. Patient verbalized understanding and agreed with plan.  Portions of this note were generated with [employer]. Dictation errors may occur despite best attempts at proofreading.         Final Clinical Impression(s) / ED Diagnoses Final diagnoses:  Acute right ankle pain  Cellulitis of right lower extremity    Rx / DC Orders ED Discharge Orders          Ordered    oxyCODONE-acetaminophen (PERCOCET/ROXICET) 5-325 MG tablet  Every 8 hours PRN        01/02/22 2337              Loni Beckwith, PA-C 01/03/22 0004    Cristie Hem, MD 01/03/22 [phone]

## 2022-01-02 NOTE — Discharge Instructions (Addendum)
You came to the emergency department today to be evaluated for your left ankle pain and swelling.  Your x-ray imaging did not show any acute abnormalities, your lab results were reassuring.  We gave you additional dose of antibiotics in the emergency department.  Please go to Dr. Pollie Friar office tomorrow morning at 830 to be reevaluated by him.  Today you received medications that may make you sleepy or impair your ability to make decisions.  For the next 24 hours please do not drive, operate heavy machinery, care for a small child with out another adult present, or perform any activities that may cause harm to you or someone else if you were to fall asleep or be impaired.   You are being prescribed a medication which may make you sleepy or mpair your ability to make decisions. Please follow up of listed precautions for at least 24 hours after taking one dose.  Get help right away if: Your symptoms get worse. You feel very sleepy. You develop vomiting or diarrhea that persists. You notice red streaks coming from the infected area. Your red area gets larger or turns dark in color.

## 2022-01-02 NOTE — ED Triage Notes (Signed)
Pt complaining of pain and swelling in her rt foot. Has been getting worse over the last few weeks. Has been getting treatment for it since April. Her ortho is Dr Hiram Comber.

## 2022-01-03 DIAGNOSIS — M25571 Pain in right ankle and joints of right foot: Secondary | ICD-10-CM | POA: Diagnosis not present

## 2022-01-04 ENCOUNTER — Other Ambulatory Visit (HOSPITAL_COMMUNITY): Payer: Self-pay | Admitting: Orthopaedic Surgery

## 2022-01-04 DIAGNOSIS — M79672 Pain in left foot: Secondary | ICD-10-CM

## 2022-01-12 ENCOUNTER — Encounter (HOSPITAL_COMMUNITY): Payer: Self-pay | Admitting: Emergency Medicine

## 2022-01-12 ENCOUNTER — Emergency Department (HOSPITAL_COMMUNITY)
Admission: EM | Admit: 2022-01-12 | Discharge: 2022-01-12 | Disposition: A | Discharge status: Home / Self Care | Payer: Medicaid Other | Attending: Emergency Medicine | Admitting: Emergency Medicine

## 2022-01-12 ENCOUNTER — Other Ambulatory Visit: Payer: Self-pay

## 2022-01-12 ENCOUNTER — Emergency Department (HOSPITAL_COMMUNITY): Payer: Medicaid Other

## 2022-01-12 DIAGNOSIS — M25571 Pain in right ankle and joints of right foot: Secondary | ICD-10-CM | POA: Insufficient documentation

## 2022-01-12 DIAGNOSIS — M7989 Other specified soft tissue disorders: Secondary | ICD-10-CM | POA: Diagnosis not present

## 2022-01-12 LAB — CBC WITH DIFFERENTIAL/PLATELET
Abs Immature Granulocytes: 0.01 10*3/uL (ref 0.00–0.07)
Basophils Absolute: 0 10*3/uL (ref 0.0–0.1)
Basophils Relative: 0 %
Eosinophils Absolute: 0.1 10*3/uL (ref 0.0–0.5)
Eosinophils Relative: 2 %
HCT: 35.7 % — ABNORMAL LOW (ref 36.0–46.0)
Hemoglobin: 11.6 g/dL — ABNORMAL LOW (ref 12.0–15.0)
Immature Granulocytes: 0 %
Lymphocytes Relative: 52 %
Lymphs Abs: 2.5 10*3/uL (ref 0.7–4.0)
MCH: 31.1 pg (ref 26.0–34.0)
MCHC: 32.5 g/dL (ref 30.0–36.0)
MCV: 95.7 fL (ref 80.0–100.0)
Monocytes Absolute: 0.4 10*3/uL (ref 0.1–1.0)
Monocytes Relative: 8 %
Neutro Abs: 1.8 10*3/uL (ref 1.7–7.7)
Neutrophils Relative %: 38 %
Platelets: 226 10*3/uL (ref 150–400)
RBC: 3.73 MIL/uL — ABNORMAL LOW (ref 3.87–5.11)
RDW: 12 % (ref 11.5–15.5)
WBC: 4.7 10*3/uL (ref 4.0–10.5)
nRBC: 0 % (ref 0.0–0.2)

## 2022-01-12 LAB — BASIC METABOLIC PANEL
Anion gap: 7 (ref 5–15)
BUN: 12 mg/dL (ref 6–20)
CO2: 22 mmol/L (ref 22–32)
Calcium: 8.9 mg/dL (ref 8.9–10.3)
Chloride: 107 mmol/L (ref 98–111)
Creatinine, Ser: 0.67 mg/dL (ref 0.44–1.00)
GFR, Estimated: >60 mL/min (ref >60)
Glucose, Bld: 91 mg/dL (ref 70–99)
Potassium: 4 mmol/L (ref 3.5–5.1)
Sodium: 136 mmol/L (ref 135–145)

## 2022-01-12 MED ORDER — ONDANSETRON HCL 4 MG/2ML IJ SOLN
4.0000 mg | Freq: Once | INTRAMUSCULAR | Status: AC
  Administered 2022-01-12: 4 mg via INTRAVENOUS
  Filled 2022-01-12: qty 2

## 2022-01-12 MED ORDER — HYDROMORPHONE HCL 1 MG/ML IJ SOLN
0.5000 mg | Freq: Once | INTRAMUSCULAR | Status: AC
  Administered 2022-01-12: 0.5 mg via INTRAVENOUS
  Filled 2022-01-12: qty 0.5

## 2022-01-12 MED ORDER — MORPHINE SULFATE (PF) 4 MG/ML IV SOLN
4.0000 mg | Freq: Once | INTRAVENOUS | Status: AC
  Administered 2022-01-12: 4 mg via INTRAVENOUS
  Filled 2022-01-12: qty 1

## 2022-01-12 MED ORDER — OXYCODONE-ACETAMINOPHEN 5-325 MG PO TABS: 1.0000 | ORAL_TABLET | ORAL | 0 refills | Status: DC | PRN

## 2022-01-12 NOTE — Discharge Instructions (Signed)
Continue to wear your brace and use your crutches for walking or standing.  Follow-up with Dr. Lucia Gaskins next week

## 2022-01-12 NOTE — ED Notes (Signed)
Pt taken to ct at this time.

## 2022-01-12 NOTE — ED Triage Notes (Signed)
Pt complaining of continued right ankle pain. Pt had surgery 5/4. Pt followed up with ortho last week and has a scheduled CT scan tomorrow. Pt states pain is worse and pain is going up right leg. Called ortho office today and was told to come here.

## 2022-01-12 NOTE — ED Notes (Signed)
This RN attempted to obtain IV with no success.

## 2022-01-13 ENCOUNTER — Encounter (HOSPITAL_BASED_OUTPATIENT_CLINIC_OR_DEPARTMENT_OTHER): Payer: Self-pay

## 2022-01-13 ENCOUNTER — Ambulatory Visit (HOSPITAL_BASED_OUTPATIENT_CLINIC_OR_DEPARTMENT_OTHER): Payer: Medicaid Other

## 2022-01-15 NOTE — ED Provider Notes (Signed)
[facility] EMERGENCY DEPARTMENT Provider Note   CSN: [id number] Arrival date & time: 01/12/22  1047     History  Chief Complaint  Patient presents with   Ankle Pain    Levi Crass is a 36 y.o. female.   Ankle Pain Associated symptoms: no fever and no neck pain         Yareni Creps is a 36 y.o. female who presents to the Emergency Department complaining of persistent right ankle pain.  Patient had surgery to her right ankle on 09/14/2021 for removal of a foreign body.  Pain of the ankle has continued.  She is currently being seen by podiatry.  She states she is here today for pain control.  She denies any new injuries.  Occasionally, she is having shooting pains from her ankle into her lower leg.  She denies any swelling, redness or pain of her posterior leg.  She denies any numbness or weakness of her leg or foot.  She has been using crutches and ankle brace without relief.     Home Medications Prior to Admission medications   Medication Sig Start Date End Date Taking? Authorizing Provider  oxyCODONE-acetaminophen (PERCOCET/ROXICET) 5-325 MG tablet Take 1 tablet by mouth every 4 (four) hours as needed. 01/12/22  Yes Cornelious Bartolucci, PA-C  pantoprazole (PROTONIX) 40 MG tablet TAKE 1 TABLET(40 MG) BY MOUTH DAILY 11/01/21   Lindell Spar, MD      Allergies    Flexeril [cyclobenzaprine] and Ibuprofen    Review of Systems   Review of Systems  Constitutional:  Negative for chills and fever.  Respiratory:  Negative for shortness of breath.   Cardiovascular:  Negative for chest pain.  Gastrointestinal:  Negative for nausea and vomiting.  Musculoskeletal:  Positive for arthralgias (Right ankle pain). Negative for joint swelling and neck pain.  Skin:  Negative for color change and wound.  Neurological:  Negative for weakness, numbness and headaches.  Psychiatric/Behavioral:  Negative for confusion.     Physical Exam Updated Vital Signs BP 126/86 (BP Location: Right  Arm)   Pulse 78   Temp 98 F (36.7 C) (Oral)   Resp 15   Ht '5\' 4"'$  (1.626 m)   Wt 83.9 kg   LMP 12/08/2020   SpO2 98%   BMI 31.75 kg/m  Physical Exam Vitals and nursing note reviewed.  Constitutional:      General: She is not in acute distress.    Appearance: Normal appearance.  Cardiovascular:     Rate and Rhythm: Normal rate and regular rhythm.     Pulses: Normal pulses.  Pulmonary:     Effort: Pulmonary effort is normal.     Breath sounds: Normal breath sounds.  Musculoskeletal:        General: Tenderness present. No swelling.     Right lower leg: No edema.     Left lower leg: No edema.     Comments: Diffuse tenderness to palpation of the lateral right ankle and lateral foot.  Mild edema noted over the lateral malleolus.  No excessive warmth or significant erythema.  Skin:    General: Skin is warm.     Capillary Refill: Capillary refill takes less than 2 seconds.     Findings: No erythema or rash.  Neurological:     General: No focal deficit present.     Mental Status: She is alert.     Sensory: No sensory deficit.     Motor: No weakness.     ED Results /  Procedures / Treatments   Labs (all labs ordered are listed, but only abnormal results are displayed) Labs Reviewed  CBC WITH DIFFERENTIAL/PLATELET - Abnormal; Notable for the following components:      Result Value   RBC 3.73 (*)    Hemoglobin 11.6 (*)    HCT 35.7 (*)    All other components within normal limits  BASIC METABOLIC PANEL    EKG None  Radiology CT Ankle Right Wo Contrast  Result Date: 01/12/2022 CLINICAL DATA:  Bone mass or bone pain of the ankle. Ankle surgery for foreign body removal 09/14/2021. EXAM: CT OF THE RIGHT ANKLE WITHOUT CONTRAST; CT OF THE RIGHT FOOT WITHOUT CONTRAST TECHNIQUE: Multidetector CT imaging of the right ankle was performed according to the standard protocol. Multiplanar CT image reconstructions were also generated. Multidetector CT imaging of the right foot was  performed according to the standard protocol. Multiplanar CT image reconstructions were also generated. RADIATION DOSE REDUCTION: This exam was performed according to the departmental dose-optimization program which includes automated exposure control, adjustment of the mA and/or kV according to patient size and/or use of iterative reconstruction technique. COMPARISON:  Right ankle radiographs 01/02/2022, right ankle and foot radiographs 09/10/2021; MRI right ankle 04/29/2017; FINDINGS: Bones/Joint/Cartilage The ankle mortise is symmetric and intact. 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 This degenerative change extends throughout the posterior subtalar joint where there is joint space narrowing, subchondral sclerosis, subchondral cystic change, and posterior and medial degenerative spurring, similar to prior radiographs and MRI. Minimal degenerative spurring at the distal lateral aspect of the great toe metatarsal head. Small type 2 os naviculare. No acute fracture is seen. Ligaments Suboptimally assessed by CT. Muscles and Tendons Normal muscle density and size.  No gross tendon tear is seen. Soft tissues Mild subcutaneous fat edema and swelling of the lateral ankle. The linear metallic foreign body within the distal fibula and lateral ankle soft tissues on 09/10/2021 radiographs has been removed, as seen on recent 01/02/2022 radiographs. IMPRESSION: 1. 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 There is prominent  degenerative spurring extending medially from the middle and posterior subtalar joints. 2. No acute fracture or dislocation. Electronically Signed   By: Yvonne Kendall M.D.   On: 01/12/2022 15:19   CT Foot Right Wo Contrast  Result Date: 01/12/2022 CLINICAL DATA:  Bone mass or bone pain of the ankle. Ankle surgery for foreign body removal 09/14/2021. EXAM: CT OF THE RIGHT ANKLE WITHOUT CONTRAST; CT OF THE RIGHT FOOT WITHOUT CONTRAST TECHNIQUE: Multidetector CT imaging of the right ankle was performed according to the standard protocol. Multiplanar CT image reconstructions were also generated. Multidetector CT imaging of the right foot was performed according to the standard protocol. Multiplanar CT image reconstructions were also generated. RADIATION DOSE REDUCTION: This exam was performed according to the departmental dose-optimization program which includes automated exposure control, adjustment of the mA and/or kV according to patient size and/or use of iterative reconstruction technique. COMPARISON:  Right ankle radiographs 01/02/2022, right ankle and foot radiographs 09/10/2021; MRI right ankle 04/29/2017; FINDINGS: Bones/Joint/Cartilage The ankle mortise is symmetric and intact. 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 This degenerative change extends throughout the posterior subtalar joint where there is joint space narrowing, subchondral sclerosis, subchondral cystic change, and posterior and medial degenerative spurring, similar to prior radiographs and MRI. Minimal degenerative spurring at the distal lateral aspect of the great toe metatarsal head. Small type 2 os naviculare. No acute fracture is seen. Ligaments Suboptimally assessed by CT. Muscles and  Tendons Normal muscle density and size.  No gross tendon tear is seen. Soft tissues Mild subcutaneous fat edema and swelling of the lateral ankle. The linear metallic foreign body within the distal fibula and lateral ankle soft tissues on 09/10/2021 radiographs has been removed, as seen on recent 01/02/2022 radiographs. IMPRESSION: 1. 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 There is prominent degenerative spurring extending medially from the middle and posterior subtalar joints. 2. No acute fracture or dislocation. Electronically Signed   By: Yvonne Kendall M.D.   On: 01/12/2022 15:19     Procedures Procedures    Medications Ordered in ED Medications  morphine (PF) 4 MG/ML injection 4 mg (4 mg Intravenous Given 01/12/22 1227)  ondansetron (ZOFRAN) injection 4 mg (4 mg Intravenous Given 01/12/22 1227)  HYDROmorphone (DILAUDID) injection 0.5 mg (0.5 mg Intravenous Given 01/12/22 1415)    ED Course/ Medical Decision Making/ A&P                           Medical Decision Making Patient here for pain control of right ankle.  History of chronic pain to the ankle, had surgery to remove foreign body on 5 4.  She is continued to have pain of the ankle.  She is currently seeing podiatry in [place], scheduled to have CT of her foot for possible coalition of the joint.  On exam, patient is tearful, there is diffuse tenderness at the lateral ankle and foot.  No pain redness or swelling of the right lower leg.  Neurovascularly intact.  Differential diagnosis would include but not limited to DVT, cellulitis, acute on chronic pain, musculoskeletal injury, acute fracture.  Amount and/or Complexity of Data Reviewed Labs: ordered.    Details: Labs interpreted by me, no  evidence of leukocytosis, chemistries unremarkable Radiology: ordered.    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  No findings suggestive of septic joint.  No new fracture or dislocation.  Extremity remains neurovascularly intact.  Patient's pain has been addressed here with IV medication.  She is feeling better.  She has crutches and ankle brace.  Will provide short course of pain medication until her upcoming podiatry appointment.  Patient agreeable to plan.  Appears appropriate for discharge home  Risk Prescription drug management.           Final Clinical Impression(s) / ED Diagnoses Final diagnoses:  Acute right ankle pain  Rx / DC Orders ED Discharge Orders          Ordered    oxyCODONE-acetaminophen (PERCOCET/ROXICET) 5-325 MG tablet  Every 4 hours PRN        01/12/22 1559              Kem Parkinson, PA-C 01/15/22 1456    Wyvonnia Dusky, MD 01/17/22 [phone]

## 2022-01-24 DIAGNOSIS — M25571 Pain in right ankle and joints of right foot: Secondary | ICD-10-CM | POA: Diagnosis not present

## 2022-01-25 ENCOUNTER — Ambulatory Visit
Admission: EM | Admit: 2022-01-25 | Discharge: 2022-01-25 | Disposition: A | Discharge status: Home / Self Care | Payer: Medicaid Other | Attending: Family Medicine | Admitting: Family Medicine

## 2022-01-25 DIAGNOSIS — G8929 Other chronic pain: Secondary | ICD-10-CM

## 2022-01-25 DIAGNOSIS — M25571 Pain in right ankle and joints of right foot: Secondary | ICD-10-CM | POA: Diagnosis not present

## 2022-01-25 MED ORDER — PREDNISONE 20 MG PO TABS: 40.0000 mg | ORAL_TABLET | Freq: Every day | ORAL | 0 refills | Status: DC

## 2022-01-25 NOTE — ED Provider Notes (Signed)
[facility]    CSN: [id number] Arrival date & time: 01/25/22  1256      History   Chief Complaint No chief complaint on file.   HPI Valerie Huber is a 36 y.o. female.   Patient presenting today with acute on chronic right ankle pain.  States this has been ongoing for most of the year and she is followed by orthopedics and awaiting a new appointment.  Pain worsened over the past few weeks.  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  Was seen in the emergency department twice in the past month for the same, initially treated for cellulitis of the area with IM antibiotics in the hospital and a course of Bactrim outpatient with resolution of those symptoms and then again went back about a week later for pain control.  Both times was given a course of controlled pain medication.  She states she has run out about 4 days ago and the pain is severe and uncontrolled with Tylenol.  Per chart review she used to take amitriptyline for her CRPS but thinks that she stopped this medication months ago.  Denies fever, chills, decreased range of motion, numbness, tingling, new injury to the area.   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   Endometriosis    Irregular periods 12/31/2014   Pelvic pain in female 12/31/2014   Pregnant 04/14/2015   Second degree uterine prolapse 12/31/2014   Seizures ([place])    Vaginal Pap smear, abnormal     Patient Active Problem List   Diagnosis Date Noted   Mass of upper outer quadrant of left breast 12/14/2021   Pain of left breast 12/14/2021   Gastroesophageal reflux disease without esophagitis 09/07/2021   OSA (obstructive sleep apnea) 09/07/2021   Dyspareunia, female    History of endometriosis    Chest pain 11/07/2020   Seasonal allergic rhinitis due to pollen 09/01/2020   Encounter for examination following  treatment at hospital 04/12/2020   Chronic diarrhea 06/30/2019   Rectal bleeding    Complex regional pain syndrome type 1 of right lower extremity 05/20/2017   Depression 11/30/2015   H/O: substance abuse ([place]) 11/30/2015   History of seizures 11/30/2015   Marijuana use 07/05/2015   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/21/2014   Anaphylaxis 09/04/2011    Past Surgical History:  Procedure Laterality Date   ANKLE SURGERY Right 09/14/2021   BIOPSY  07/06/2019   Procedure: BIOPSY;  Surgeon: Daneil Dolin, MD;  [facility];  Service: Endoscopy;;  colon    COLONOSCOPY WITH PROPOFOL N/A 07/06/2019   one 3 mm polyp hyperplastic, segmental biopsies negative   DILITATION & CURRETTAGE/HYSTROSCOPY WITH NOVASURE ABLATION N/A 10/08/2017   Procedure: DILATATION & CURETTAGE/HYSTEROSCOPY WITH MINERVA  ENDOMETRIAL ABLATION (Procedure #2);  Surgeon: [name], MD;  Location: [facility];  Service: Gynecology;  Laterality: N/A;   ESOPHAGOGASTRODUODENOSCOPY N/A 12/10/2014   SLF: hematoemesis due to moderate erosive gastritis, duoedentitis, and duodenal ulcer   LEEP     POLYPECTOMY  07/06/2019   Procedure: POLYPECTOMY;  Surgeon: Daneil Dolin, MD;  [facility];  Service: Endoscopy;;   surgery for endometriosis  2005   TUBAL LIGATION Bilateral 10/08/2017   Procedure: BILATERAL TUBAL STERILIZATION WITH FALLOPE RINGS APPLICATION (Procedure #1);  Surgeon: [name], MD;  Location: [facility];  Service: Gynecology;  Laterality: Bilateral;   VAGINAL HYSTERECTOMY N/A 12/14/2020   Procedure: TOTAL HYSTERECTOMY VAGINAL;  Surgeon: Florian Buff, MD;  Location: [facility];  Service: Gynecology;  Laterality: N/A;   OB History     Gravida  3   Para  2   Term  2   Preterm      AB  1   Living  2      SAB      IAB  1   Ectopic      Multiple  0   Live Births  2           Home Medications    Prior to Admission medications   Medication Sig Start Date End  Date Taking? Authorizing Provider  oxyCODONE-acetaminophen (PERCOCET/ROXICET) 5-325 MG tablet Take 1 tablet by mouth every 4 (four) hours as needed. 01/12/22  Yes Triplett, Tammy, PA-C  predniSONE (DELTASONE) 20 MG tablet Take 2 tablets (40 mg total) by mouth daily with breakfast. 01/25/22  Yes Volney American, PA-C  pantoprazole (PROTONIX) 40 MG tablet TAKE 1 TABLET(40 MG) BY MOUTH DAILY 11/01/21   Lindell Spar, MD    Family History Family History  Problem Relation Age of Onset   Cancer Mother        kidney    Asthma Mother    Hypertension Mother    Colon polyps Sister        35, large polyp, done at 47    Cancer Paternal Grandmother    Asthma Son    Colon cancer Neg Hx    Social History Social History   Tobacco Use   Smoking status: Former    Packs/day: 0.25    Years: 14.00    Total pack years: 3.50    Types: Cigarettes   Smokeless tobacco: Never   Tobacco comments:    quit in 2017   Vaping Use   Vaping Use: Never used  Substance Use Topics   Alcohol use: Not Currently    Comment: occ   Drug use: Not Currently    Types: Marijuana   Allergies   Flexeril [cyclobenzaprine] and Ibuprofen   Review of Systems Review of Systems PER HPI  Physical Exam Triage Vital Signs ED Triage Vitals  Enc Vitals Group     BP --      Pulse Rate 01/25/22 1307 92     Resp 01/25/22 1307 18     Temp 01/25/22 1307 98.2 F (36.8 C)     Temp Source 01/25/22 1307 Tympanic     SpO2 01/25/22 1307 96 %     Weight --      Height --      Head Circumference --      Peak Flow --      Pain Score 01/25/22 1309 10     Pain Loc --      Pain Edu? --      Excl. in [place]? --    No data found.  Updated Vital Signs Pulse 92   Temp 98.2 F (36.8 C) (Tympanic)   Resp 18   LMP 12/08/2020   SpO2 96%   Visual Acuity Right Eye Distance:   Left Eye Distance:   Bilateral Distance:    Right Eye Near:   Left Eye Near:    Bilateral Near:     Physical Exam Vitals and nursing note  reviewed.  Constitutional:      Comments: In wheelchair, crying and moaning due  to pain  HENT:     Head: Atraumatic.  Eyes:     Extraocular Movements: Extraocular movements intact.     Conjunctiva/sclera: Conjunctivae normal.  Cardiovascular:     Rate and Rhythm: Normal rate and regular rhythm.  Pulmonary:     Effort: Pulmonary effort is normal.     Breath sounds: Normal breath sounds.  Musculoskeletal:        General: Swelling and tenderness present. Normal range of motion.     Cervical back: Normal range of motion and neck supple.     Comments: Trace edema, significant tenderness to palpation diffusely across right ankle down into foot and up into lower distal leg.  No erythema, warmth, range of motion intact.  Currently in a wheelchair due to severe pain with weightbearing, typically ambulates with crutches  Skin:    General: Skin is warm and dry.     Findings: No bruising or erythema.  Neurological:     Mental Status: She is oriented to person, place, and time.     Comments: Right foot appears neurovascularly intact  Psychiatric:        Mood and Affect: Mood normal.        Thought Content: Thought content normal.        Judgment: Judgment normal.    UC Treatments / Results  Labs (all labs ordered are listed, but only abnormal results are displayed) Labs Reviewed - No data to display  EKG   Radiology No results found.  Procedures Procedures (including critical care time)  Medications Ordered in UC Medications - No data to display  Initial Impression / Assessment and Plan / UC Course  I have reviewed the triage vital signs and the nursing notes.  Pertinent labs & imaging results that were available during my care of the patient were reviewed by me and considered in my medical decision making (see chart for details).     We will defer repeat imaging today as this is an ongoing issue and followed by a specialist already.  She has had numerous prescriptions filled  in the last month or 2 for controlled pain medication, discussed that she needs to speak with her primary care provider about possibly a chronic pain management referral given the ongoing nature of her issue and pain levels that appear to be requiring narcotic pain medication.  Also discussed looking into getting back onto the amitriptyline to help control her pain.  We will try a prednisone taper for pain and swelling, leg elevation, ice, Tylenol and close PCP and specialist follow-up.  Final Clinical Impressions(s) / UC Diagnoses   Final diagnoses:  Chronic pain of right ankle   Discharge Instructions   None    ED Prescriptions     Medication Sig Dispense Auth. Provider   predniSONE (DELTASONE) 20 MG tablet Take 2 tablets (40 mg total) by mouth daily with breakfast. 10 tablet Volney American, [place]      I have reviewed the PDMP during this encounter.   Volney American, [place] 01/25/22 1421

## 2022-01-25 NOTE — ED Triage Notes (Signed)
Pt reports having surgery recently. States she had a pin and needle taken out. States she is in a lot of pain. Reports she has taken percocet's. Pt states the pain comes and goes with the medicine.

## 2022-01-26 ENCOUNTER — Ambulatory Visit: Payer: Medicaid Other | Admitting: Internal Medicine

## 2022-01-26 ENCOUNTER — Other Ambulatory Visit: Payer: Self-pay | Admitting: Orthopaedic Surgery

## 2022-01-26 ENCOUNTER — Encounter: Payer: Self-pay | Admitting: Internal Medicine

## 2022-01-26 VITALS — BP 132/88 | HR 106 | Resp 18

## 2022-01-26 DIAGNOSIS — F4310 Post-traumatic stress disorder, unspecified: Secondary | ICD-10-CM | POA: Diagnosis not present

## 2022-01-26 DIAGNOSIS — M25571 Pain in right ankle and joints of right foot: Secondary | ICD-10-CM

## 2022-01-26 DIAGNOSIS — G90521 Complex regional pain syndrome I of right lower limb: Secondary | ICD-10-CM

## 2022-01-26 DIAGNOSIS — F411 Generalized anxiety disorder: Secondary | ICD-10-CM | POA: Diagnosis not present

## 2022-01-26 MED ORDER — OXYCODONE-ACETAMINOPHEN 5-325 MG PO TABS: 1.0000 | ORAL_TABLET | Freq: Four times a day (QID) | ORAL | 0 refills | Status: DC | PRN

## 2022-01-26 NOTE — Patient Instructions (Signed)
Please take Oxycodone only for severe pain up to 4 times per day.  Please go to ER if you have excruciating ankle pain.  Please contact your Podiatrist for surgical needs.

## 2022-01-26 NOTE — Progress Notes (Signed)
Acute Office Visit  Subjective:    Patient ID: Valerie Huber, female    DOB: 1985-10-26, 36 y.o.   MRN: [id number]  Chief Complaint  Patient presents with   Ankle Pain    Patient has right ankle pain see dr Gaspar Cola needs surgery ortho office will not give her any pain meds she has been to urgent care and er multiple times     HPI Patient is in today for complaint of acute on chronic right ankle pain.  She has seen orthopedic surgeon and podiatrist for chronic ankle pain.  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  She complains of acute worsening of right ankle pain for the last few days, 10/10, sharp, constant and has ankle swelling on the lateral side.  She denies any recent injury.  She states that she was told to get surgery by Dr. Lucia Gaskins, and has contacted to schedule it.  She has been to ER and Urgent care multiple times. She has been on oxycodone for severe pain in the past.  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   Endometriosis    Irregular periods 12/31/2014   Pelvic pain in female 12/31/2014   Pregnant 04/14/2015   Second degree uterine prolapse 12/31/2014   Seizures ([place])    Vaginal Pap smear, abnormal     Past Surgical History:  Procedure Laterality Date   ANKLE SURGERY Right 09/14/2021   BIOPSY  07/06/2019   Procedure: BIOPSY;  Surgeon: Daneil Dolin, MD;  [facility];  Service: Endoscopy;;  colon    COLONOSCOPY WITH PROPOFOL N/A 07/06/2019   one 3 mm polyp hyperplastic, segmental biopsies negative   DILITATION & CURRETTAGE/HYSTROSCOPY WITH NOVASURE ABLATION N/A 10/08/2017   Procedure: DILATATION & CURETTAGE/HYSTEROSCOPY WITH MINERVA  ENDOMETRIAL ABLATION (Procedure #2);  Surgeon: [name], MD;  Location: [facility];  Service: Gynecology;  Laterality: N/A;   ESOPHAGOGASTRODUODENOSCOPY N/A 12/10/2014   SLF: hematoemesis due to moderate  erosive gastritis, duoedentitis, and duodenal ulcer   LEEP     POLYPECTOMY  07/06/2019   Procedure: POLYPECTOMY;  Surgeon: Daneil Dolin, MD;  [facility];  Service: Endoscopy;;   surgery for endometriosis  2005   TUBAL LIGATION Bilateral 10/08/2017   Procedure: BILATERAL TUBAL STERILIZATION WITH FALLOPE RINGS APPLICATION (Procedure #1);  Surgeon: [name], MD;  Location: [facility];  Service: Gynecology;  Laterality: Bilateral;   VAGINAL HYSTERECTOMY N/A 12/14/2020   Procedure: TOTAL HYSTERECTOMY VAGINAL;  Surgeon: Florian Buff, MD;  Location: [facility];  Service: Gynecology;  Laterality: N/A;    Family History  Problem Relation Age of Onset   Cancer Mother        kidney    Asthma Mother    Hypertension Mother    Colon polyps Sister        35, large polyp, done at 24    Cancer Paternal Grandmother    Asthma Son    Colon cancer Neg Hx     Social History   Socioeconomic History   Marital status: Single    Spouse name: Not on file   Number of children: Not on file   Years of education: Not on file   Highest education level: Not on file  Occupational History   Occupation: Housecleaning  Tobacco Use   Smoking status: Former    Packs/day: 0.25    Years: 14.00  Total pack years: 3.50    Types: Cigarettes   Smokeless tobacco: Never   Tobacco comments:    quit in 2017   Vaping Use   Vaping Use: Never used  Substance and Sexual Activity   Alcohol use: Not Currently    Comment: occ   Drug use: Not Currently    Types: Marijuana   Sexual activity: Not Currently    Birth control/protection: Surgical    Comment: hyst  Other Topics Concern   Not on file  Social History Narrative   Not on file   Social Determinants of Health   Financial Resource Strain: Low Risk  (09/13/2021)   Overall Financial Resource Strain (CARDIA)    Difficulty of Paying Living Expenses: Not very hard  Food Insecurity: No Food Insecurity (06/23/2021)   Hunger Vital Sign     Worried About Running Out of Food in the Last Year: Never true    Ran Out of Food in the Last Year: Never true  Transportation Needs: No Transportation Needs (06/23/2021)   PRAPARE - Hydrologist (Medical): No    Lack of Transportation (Non-Medical): No  Physical Activity: Inactive (08/08/2021)   Exercise Vital Sign    Days of Exercise per Week: 0 days    Minutes of Exercise per Session: 0 min  Stress: Stress Concern Present (09/13/2021)   [place]    Feeling of Stress : Very much  Social Connections: Not on file  Intimate Partner Violence: Not At Risk (08/08/2021)   Humiliation, Afraid, Rape, and Kick questionnaire    Fear of Current or Ex-Partner: No    Emotionally Abused: No    Physically Abused: No    Sexually Abused: No    Outpatient Medications Prior to Visit  Medication Sig Dispense Refill   pantoprazole (PROTONIX) 40 MG tablet TAKE 1 TABLET(40 MG) BY MOUTH DAILY 30 tablet 3   predniSONE (DELTASONE) 20 MG tablet Take 2 tablets (40 mg total) by mouth daily with breakfast. 10 tablet 0   oxyCODONE-acetaminophen (PERCOCET/ROXICET) 5-325 MG tablet Take 1 tablet by mouth every 4 (four) hours as needed. (Patient not taking: Reported on 01/26/2022) 12 tablet 0   No facility-administered medications prior to visit.    Allergies  Allergen Reactions   Flexeril [Cyclobenzaprine] Hives   Ibuprofen Anaphylaxis    Throat, eyes, lips swollen    Review of Systems  Constitutional:  Negative for chills and fever.  HENT:  Negative for congestion, sinus pressure, sinus pain and sore throat.   Eyes:  Negative for pain and discharge.  Respiratory:  Negative for cough and shortness of breath.   Cardiovascular:  Negative for chest pain and palpitations.  Gastrointestinal:  Negative for abdominal pain, diarrhea, nausea and vomiting.  Endocrine: Negative for polydipsia and polyuria.  Genitourinary:   Negative for dysuria and hematuria.  Musculoskeletal:  Positive for arthralgias, back pain and gait problem. Negative for neck pain and neck stiffness.  Skin:  Negative for rash.  Neurological:  Negative for dizziness and weakness.  Psychiatric/Behavioral:  Negative for agitation and behavioral problems.        Objective:    Physical Exam Vitals reviewed.  Constitutional:      General: She is not in acute distress.    Appearance: She is not diaphoretic.     Comments: Has crutches  HENT:     Head: Normocephalic and atraumatic.     Nose: Nose normal.  Mouth/Throat:     Mouth: Mucous membranes are moist.  Eyes:     General: No scleral icterus.    Extraocular Movements: Extraocular movements intact.  Cardiovascular:     Rate and Rhythm: Normal rate and regular rhythm.     Pulses: Normal pulses.     Heart sounds: Normal heart sounds. No murmur heard. Pulmonary:     Breath sounds: Normal breath sounds. No wheezing or rales.  Musculoskeletal:     Cervical back: Neck supple. No tenderness.     Right lower leg: Edema (Over lateral side) present.     Comments: Orthotic shoe on right side  Skin:    General: Skin is warm.     Findings: No rash.     Comments: Scar in the right side of neck area - C/D/I  Neurological:     General: No focal deficit present.     Mental Status: She is alert and oriented to person, place, and time.     Sensory: No sensory deficit.     Motor: No weakness.  Psychiatric:        Mood and Affect: Mood normal.        Behavior: Behavior normal.     BP 132/88 (BP Location: Right Arm, Patient Position: Sitting, Cuff Size: Normal)   Pulse (!) 106   Resp 18   LMP 12/08/2020   SpO2 99%  Wt Readings from Last 3 Encounters:  01/12/22 184 lb 15.5 oz (83.9 kg)  01/02/22 185 lb (83.9 kg)  12/14/21 185 lb (83.9 kg)        Assessment & Plan:   Problem List Items Addressed This Visit       Nervous and Auditory   Complex regional pain syndrome type 1  of right lower extremity    Followed by orthopedic surgery - Dr. 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    Dispense:  30 tablet    Refill:  0     Karesha Trzcinski Keith Rake, MD

## 2022-01-26 NOTE — Assessment & Plan Note (Signed)
Followed by orthopedic surgery - Dr. Sharol Given Was on amitriptyline now Referred to pain clinic

## 2022-01-26 NOTE — Assessment & Plan Note (Signed)
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

## 2022-02-04 ENCOUNTER — Other Ambulatory Visit: Payer: Self-pay | Admitting: Internal Medicine

## 2022-02-04 DIAGNOSIS — K920 Hematemesis: Secondary | ICD-10-CM

## 2022-02-06 ENCOUNTER — Encounter (HOSPITAL_BASED_OUTPATIENT_CLINIC_OR_DEPARTMENT_OTHER): Payer: Self-pay | Admitting: Orthopaedic Surgery

## 2022-02-06 ENCOUNTER — Other Ambulatory Visit: Payer: Self-pay

## 2022-02-09 DIAGNOSIS — F4310 Post-traumatic stress disorder, unspecified: Secondary | ICD-10-CM | POA: Diagnosis not present

## 2022-02-09 DIAGNOSIS — F411 Generalized anxiety disorder: Secondary | ICD-10-CM | POA: Diagnosis not present

## 2022-02-13 ENCOUNTER — Encounter (HOSPITAL_BASED_OUTPATIENT_CLINIC_OR_DEPARTMENT_OTHER)
Admission: RE | Disposition: A | Discharge status: Home / Self Care | Payer: Self-pay | Source: Home / Self Care | Attending: Orthopaedic Surgery

## 2022-02-13 ENCOUNTER — Ambulatory Visit (HOSPITAL_BASED_OUTPATIENT_CLINIC_OR_DEPARTMENT_OTHER): Payer: Medicaid Other | Admitting: Anesthesiology

## 2022-02-13 ENCOUNTER — Ambulatory Visit (HOSPITAL_COMMUNITY): Payer: Medicaid Other

## 2022-02-13 ENCOUNTER — Ambulatory Visit (HOSPITAL_BASED_OUTPATIENT_CLINIC_OR_DEPARTMENT_OTHER)
Admission: RE | Admit: 2022-02-13 | Discharge: 2022-02-13 | Disposition: A | Discharge status: Home / Self Care | Payer: Medicaid Other | Attending: Orthopaedic Surgery | Admitting: Orthopaedic Surgery

## 2022-02-13 ENCOUNTER — Other Ambulatory Visit: Payer: Self-pay

## 2022-02-13 ENCOUNTER — Encounter (HOSPITAL_BASED_OUTPATIENT_CLINIC_OR_DEPARTMENT_OTHER): Payer: Self-pay | Admitting: Orthopaedic Surgery

## 2022-02-13 DIAGNOSIS — J45909 Unspecified asthma, uncomplicated: Secondary | ICD-10-CM | POA: Insufficient documentation

## 2022-02-13 DIAGNOSIS — G8918 Other acute postprocedural pain: Secondary | ICD-10-CM | POA: Diagnosis not present

## 2022-02-13 DIAGNOSIS — M19071 Primary osteoarthritis, right ankle and foot: Secondary | ICD-10-CM | POA: Insufficient documentation

## 2022-02-13 DIAGNOSIS — Q6689 Other  specified congenital deformities of feet: Secondary | ICD-10-CM

## 2022-02-13 DIAGNOSIS — K219 Gastro-esophageal reflux disease without esophagitis: Secondary | ICD-10-CM | POA: Insufficient documentation

## 2022-02-13 DIAGNOSIS — M25571 Pain in right ankle and joints of right foot: Secondary | ICD-10-CM

## 2022-02-13 DIAGNOSIS — Z87891 Personal history of nicotine dependence: Secondary | ICD-10-CM | POA: Diagnosis not present

## 2022-02-13 HISTORY — PX: FOOT ARTHRODESIS: SHX1655

## 2022-02-13 SURGERY — FUSION, JOINT, FOOT
Anesthesia: General | Site: Foot | Laterality: Right

## 2022-02-13 MED ORDER — FENTANYL CITRATE (PF) 100 MCG/2ML IJ SOLN
INTRAMUSCULAR | Status: AC
  Filled 2022-02-13: qty 2

## 2022-02-13 MED ORDER — CEFAZOLIN SODIUM-DEXTROSE 2-4 GM/100ML-% IV SOLN
2.0000 g | INTRAVENOUS | Status: AC
  Administered 2022-02-13: 2 g via INTRAVENOUS

## 2022-02-13 MED ORDER — PROPOFOL 500 MG/50ML IV EMUL
INTRAVENOUS | Status: AC
  Filled 2022-02-13: qty 50

## 2022-02-13 MED ORDER — HYDROMORPHONE HCL 1 MG/ML IJ SOLN
INTRAMUSCULAR | Status: AC
  Filled 2022-02-13: qty 0.5

## 2022-02-13 MED ORDER — OXYCODONE HCL 5 MG PO TABS: 5.0000 mg | ORAL_TABLET | ORAL | 0 refills | Status: AC | PRN

## 2022-02-13 MED ORDER — ROPIVACAINE HCL 5 MG/ML IJ SOLN
INTRAMUSCULAR | Status: DC | PRN
  Administered 2022-02-13 (×10): 5 mL via EPIDURAL

## 2022-02-13 MED ORDER — ASPIRIN 325 MG PO TABS: 325.0000 mg | ORAL_TABLET | Freq: Every day | ORAL | 0 refills | Status: AC

## 2022-02-13 MED ORDER — ONDANSETRON HCL 4 MG/2ML IJ SOLN
INTRAMUSCULAR | Status: DC | PRN
  Administered 2022-02-13: 4 mg via INTRAVENOUS

## 2022-02-13 MED ORDER — FENTANYL CITRATE (PF) 100 MCG/2ML IJ SOLN
25.0000 ug | INTRAMUSCULAR | Status: DC | PRN
  Administered 2022-02-13 (×3): 50 ug via INTRAVENOUS

## 2022-02-13 MED ORDER — LIDOCAINE HCL (CARDIAC) PF 100 MG/5ML IV SOSY
PREFILLED_SYRINGE | INTRAVENOUS | Status: DC | PRN
  Administered 2022-02-13: 100 mg via INTRAVENOUS

## 2022-02-13 MED ORDER — ACETAMINOPHEN 10 MG/ML IV SOLN
1000.0000 mg | Freq: Once | INTRAVENOUS | Status: DC | PRN
  Administered 2022-02-13: 1000 mg via INTRAVENOUS

## 2022-02-13 MED ORDER — HYDROMORPHONE HCL 1 MG/ML IJ SOLN
0.5000 mg | INTRAMUSCULAR | Status: DC | PRN
  Administered 2022-02-13 (×3): 0.5 mg via INTRAVENOUS

## 2022-02-13 MED ORDER — MIDAZOLAM HCL 2 MG/2ML IJ SOLN
2.0000 mg | Freq: Once | INTRAMUSCULAR | Status: AC
  Administered 2022-02-13: 2 mg via INTRAVENOUS

## 2022-02-13 MED ORDER — DEXAMETHASONE SODIUM PHOSPHATE 10 MG/ML IJ SOLN
INTRAMUSCULAR | Status: AC
  Filled 2022-02-13: qty 1

## 2022-02-13 MED ORDER — PHENYLEPHRINE HCL (PRESSORS) 10 MG/ML IV SOLN
INTRAVENOUS | Status: DC | PRN
  Administered 2022-02-13 (×4): 80 ug via INTRAVENOUS

## 2022-02-13 MED ORDER — 0.9 % SODIUM CHLORIDE (POUR BTL) OPTIME
TOPICAL | Status: DC | PRN
  Administered 2022-02-13: 200 mL

## 2022-02-13 MED ORDER — DEXMEDETOMIDINE HCL IN NACL 80 MCG/20ML IV SOLN
INTRAVENOUS | Status: DC | PRN
  Administered 2022-02-13 (×2): 8 ug via BUCCAL
  Administered 2022-02-13: 4 ug via BUCCAL

## 2022-02-13 MED ORDER — FENTANYL CITRATE (PF) 100 MCG/2ML IJ SOLN
INTRAMUSCULAR | Status: DC | PRN
  Administered 2022-02-13 (×2): 50 ug via INTRAVENOUS
  Administered 2022-02-13: 100 ug via INTRAVENOUS

## 2022-02-13 MED ORDER — LIDOCAINE 2% (20 MG/ML) 5 ML SYRINGE
INTRAMUSCULAR | Status: AC
  Filled 2022-02-13: qty 5

## 2022-02-13 MED ORDER — EPHEDRINE SULFATE (PRESSORS) 50 MG/ML IJ SOLN
INTRAMUSCULAR | Status: DC | PRN
  Administered 2022-02-13 (×2): 10 mg via INTRAVENOUS

## 2022-02-13 MED ORDER — ONDANSETRON HCL 4 MG/2ML IJ SOLN
INTRAMUSCULAR | Status: AC
  Filled 2022-02-13: qty 2

## 2022-02-13 MED ORDER — ACETAMINOPHEN 160 MG/5ML PO SOLN: 325.0000 mg | ORAL | Status: DC | PRN

## 2022-02-13 MED ORDER — BUPIVACAINE HCL (PF) 0.5 % IJ SOLN
INTRAMUSCULAR | Status: AC
  Filled 2022-02-13: qty 30

## 2022-02-13 MED ORDER — OXYCODONE HCL 5 MG/5ML PO SOLN: 5.0000 mg | Freq: Once | ORAL | Status: DC | PRN

## 2022-02-13 MED ORDER — OXYCODONE HCL 5 MG PO TABS: 5.0000 mg | ORAL_TABLET | Freq: Once | ORAL | Status: DC | PRN

## 2022-02-13 MED ORDER — MIDAZOLAM HCL 2 MG/2ML IJ SOLN
INTRAMUSCULAR | Status: AC
  Filled 2022-02-13: qty 2

## 2022-02-13 MED ORDER — PROPOFOL 10 MG/ML IV BOLUS
INTRAVENOUS | Status: DC | PRN
  Administered 2022-02-13: 200 mg via INTRAVENOUS

## 2022-02-13 MED ORDER — FENTANYL CITRATE (PF) 100 MCG/2ML IJ SOLN
100.0000 ug | Freq: Once | INTRAMUSCULAR | Status: AC
  Administered 2022-02-13: 100 ug via INTRAVENOUS

## 2022-02-13 MED ORDER — DEXAMETHASONE SODIUM PHOSPHATE 10 MG/ML IJ SOLN
INTRAMUSCULAR | Status: DC | PRN
  Administered 2022-02-13 (×2): 10 mg via INTRAVENOUS
  Administered 2022-02-13: 4 mg via INTRAVENOUS

## 2022-02-13 MED ORDER — ACETAMINOPHEN 325 MG PO TABS: 325.0000 mg | ORAL_TABLET | ORAL | Status: DC | PRN

## 2022-02-13 MED ORDER — CEFAZOLIN SODIUM-DEXTROSE 2-4 GM/100ML-% IV SOLN
INTRAVENOUS | Status: AC
  Filled 2022-02-13: qty 100

## 2022-02-13 MED ORDER — LACTATED RINGERS IV SOLN: INTRAVENOUS | Status: DC

## 2022-02-13 MED ORDER — ONDANSETRON HCL 4 MG/2ML IJ SOLN: 4.0000 mg | Freq: Once | INTRAMUSCULAR | Status: DC | PRN

## 2022-02-13 MED ORDER — MEPERIDINE HCL 25 MG/ML IJ SOLN: 6.2500 mg | INTRAMUSCULAR | Status: DC | PRN

## 2022-02-13 MED ORDER — ACETAMINOPHEN 10 MG/ML IV SOLN
INTRAVENOUS | Status: AC
  Filled 2022-02-13: qty 100

## 2022-02-13 MED ORDER — CLONIDINE HCL (ANALGESIA) 100 MCG/ML EP SOLN
EPIDURAL | Status: DC | PRN
  Administered 2022-02-13 (×2): 100 ug

## 2022-02-13 SURGICAL SUPPLY — 74 items
APL PRP STRL LF DISP 70% ISPRP (MISCELLANEOUS) ×1
APL SKNCLS STERI-STRIP NONHPOA (GAUZE/BANDAGES/DRESSINGS)
BANDAGE ESMARK 6X9 LF (GAUZE/BANDAGES/DRESSINGS) IMPLANT
BENZOIN TINCTURE PRP APPL 2/3 (GAUZE/BANDAGES/DRESSINGS) IMPLANT
BIT DRILL 4 MINI HUDSON CANN (BIT) IMPLANT
BIT DRILL 5/64X5 DISP (BIT) IMPLANT
BIT DRILL 7/64X5 DISP (BIT) IMPLANT
BLADE AVERAGE 25X9 (BLADE) IMPLANT
BLADE SURG 15 STRL LF DISP TIS (BLADE) ×3 IMPLANT
BLADE SURG 15 STRL SS (BLADE) ×7
BNDG CMPR 5X4 CHSV STRCH STRL (GAUZE/BANDAGES/DRESSINGS)
BNDG CMPR 9X4 STRL LF SNTH (GAUZE/BANDAGES/DRESSINGS)
BNDG CMPR 9X6 STRL LF SNTH (GAUZE/BANDAGES/DRESSINGS)
BNDG COHESIVE 4X5 TAN STRL LF (GAUZE/BANDAGES/DRESSINGS) IMPLANT
BNDG ELASTIC 4X5.8 VLCR STR LF (GAUZE/BANDAGES/DRESSINGS) IMPLANT
BNDG ELASTIC 6X5.8 VLCR STR LF (GAUZE/BANDAGES/DRESSINGS) ×2 IMPLANT
BNDG ESMARK 4X9 LF (GAUZE/BANDAGES/DRESSINGS) IMPLANT
BNDG ESMARK 6X9 LF (GAUZE/BANDAGES/DRESSINGS)
CHLORAPREP W/TINT 26 (MISCELLANEOUS) ×1 IMPLANT
COVER BACK TABLE 60X90IN (DRAPES) ×1 IMPLANT
COVER MAYO STAND STRL (DRAPES) IMPLANT
DRAPE C-ARM 42X72 X-RAY (DRAPES) ×1 IMPLANT
DRAPE C-ARMOR (DRAPES) ×1 IMPLANT
DRAPE EXTREMITY T 121X128X90 (DISPOSABLE) ×1 IMPLANT
DRAPE IMP U-DRAPE 54X76 (DRAPES) ×1 IMPLANT
DRAPE U-SHAPE 47X51 STRL (DRAPES) ×1 IMPLANT
ELECT REM PT RETURN 9FT ADLT (ELECTROSURGICAL) ×1
ELECTRODE REM PT RTRN 9FT ADLT (ELECTROSURGICAL) ×1 IMPLANT
GAUZE SPONGE 4X4 12PLY STRL (GAUZE/BANDAGES/DRESSINGS) ×1 IMPLANT
GAUZE XEROFORM 1X8 LF (GAUZE/BANDAGES/DRESSINGS) ×1 IMPLANT
GLOVE BIO SURGEON STRL SZ7 (GLOVE) IMPLANT
GLOVE BIOGEL M STRL SZ7.5 (GLOVE) ×1 IMPLANT
GLOVE BIOGEL PI IND STRL 7.0 (GLOVE) IMPLANT
GLOVE BIOGEL PI IND STRL 7.5 (GLOVE) IMPLANT
GLOVE BIOGEL PI IND STRL 8 (GLOVE) ×1 IMPLANT
GLOVE SURG SS PI 7.0 STRL IVOR (GLOVE) IMPLANT
GOWN STRL REUS W/ TWL LRG LVL3 (GOWN DISPOSABLE) ×1 IMPLANT
GOWN STRL REUS W/ TWL XL LVL3 (GOWN DISPOSABLE) ×1 IMPLANT
GOWN STRL REUS W/TWL LRG LVL3 (GOWN DISPOSABLE) ×2
GOWN STRL REUS W/TWL XL LVL3 (GOWN DISPOSABLE) ×2
GUIDEWIRE 2.4 HINDFOOT (WIRE) ×2
GUIDEWIRE W/TROCAR TIP .094X8 (WIRE) IMPLANT
KIT BIOCARTILAGE LG JOINT MIX (KITS) IMPLANT
NS IRRIG 1000ML POUR BTL (IV SOLUTION) ×1 IMPLANT
PACK BASIN DAY SURGERY FS (CUSTOM PROCEDURE TRAY) ×1 IMPLANT
PAD CAST 4YDX4 CTTN HI CHSV (CAST SUPPLIES) ×1 IMPLANT
PADDING CAST COTTON 4X4 STRL (CAST SUPPLIES) ×1
PADDING CAST SYNTHETIC 4X4 STR (CAST SUPPLIES) ×1 IMPLANT
PENCIL SMOKE EVACUATOR (MISCELLANEOUS) ×1 IMPLANT
PUTTY DBM ALLOSYNC PURE 5CC (Putty) IMPLANT
SCREW LOW PROFILE TIT 6.7X70 (Screw) IMPLANT
SCREW LP TI 6.7X50M CANN 18THR (Screw) IMPLANT
SHEET MEDIUM DRAPE 40X70 STRL (DRAPES) ×1 IMPLANT
SLEEVE SCD COMPRESS KNEE MED (STOCKING) ×1 IMPLANT
SPIKE FLUID TRANSFER (MISCELLANEOUS) IMPLANT
SPLINT PLASTER CAST FAST 5X30 (CAST SUPPLIES) ×20 IMPLANT
SPONGE T-LAP 18X18 ~~LOC~~+RFID (SPONGE) ×1 IMPLANT
STOCKINETTE 6  STRL (DRAPES) ×1
STOCKINETTE 6 STRL (DRAPES) ×1 IMPLANT
STOCKINETTE TUBULAR 6 INCH (GAUZE/BANDAGES/DRESSINGS) ×1 IMPLANT
STRIP CLOSURE SKIN 1/2X4 (GAUZE/BANDAGES/DRESSINGS) IMPLANT
SUCTION FRAZIER HANDLE 10FR (MISCELLANEOUS) ×1
SUCTION TUBE FRAZIER 10FR DISP (MISCELLANEOUS) ×1 IMPLANT
SUT ETHILON 3 0 PS 1 (SUTURE) ×1 IMPLANT
SUT MNCRL AB 3-0 PS2 18 (SUTURE) ×1 IMPLANT
SUT PDS AB 2-0 CT2 27 (SUTURE) ×1 IMPLANT
SUT VIC AB 2-0 SH 27 (SUTURE) ×2
SUT VIC AB 2-0 SH 27XBRD (SUTURE) IMPLANT
SUT VIC AB 3-0 FS2 27 (SUTURE) IMPLANT
SYR 5ML LL (SYRINGE) ×1 IMPLANT
SYR BULB EAR ULCER 3OZ GRN STR (SYRINGE) ×1 IMPLANT
TOWEL GREEN STERILE FF (TOWEL DISPOSABLE) ×2 IMPLANT
TUBE CONNECTING 20X1/4 (TUBING) ×1 IMPLANT
UNDERPAD 30X36 HEAVY ABSORB (UNDERPADS AND DIAPERS) ×1 IMPLANT

## 2022-02-13 NOTE — Anesthesia Procedure Notes (Signed)
Anesthesia Regional Block: Popliteal block   Pre-Anesthetic Checklist: , timeout performed,  Correct Patient, Correct Site, Correct Laterality,  Correct Procedure, Correct Position, site marked,  Risks and benefits discussed,  Surgical consent,  Pre-op evaluation,  At surgeon's request and post-op pain management  Laterality: Lower and Right  Prep: chloraprep       Needles:  Injection technique: Single-shot  Needle Type: Echogenic Stimulator Needle     Needle Length: 9cm  Needle Gauge: 20   Needle insertion depth: 2 cm   Additional Needles:   Procedures:,,,, ultrasound used (permanent image in chart),,    Narrative:  Start time: 02/13/2022 7:44 AM End time: 02/13/2022 7:50 AM Injection made incrementally with aspirations every 5 mL.  Performed by: Personally  Anesthesiologist: Lyn Hollingshead, MD

## 2022-02-13 NOTE — Progress Notes (Signed)
Assisted Dr. Jillyn Hidden with right utlrasound guided, adductor canal, popliteal block. Side rails up, monitors on throughout procedure. See vital signs in flow sheet. Tolerated Procedure well.

## 2022-02-13 NOTE — Anesthesia Postprocedure Evaluation (Deleted)
Anesthesia Post Note  Patient: Valerie Huber  Procedure(s) Performed: RIGHT FOOT EXCISION OF MEDIAL CALCANEAL HYPERTROPHY, TALUS HYPERTROPHY, SUBTALAR JOINT ARTHRODESIS (Right: Foot)     Anesthesia Post Evaluation No notable events documented.  Last Vitals:  Vitals:   02/13/22 0952 02/13/22 1007  BP: 111/70 106/73  Pulse: 86 84  Resp: 18 13  Temp: 36.5 C   SpO2: 99% 100%    Last Pain:  Vitals:   02/13/22 1015  TempSrc:   PainSc: 10-Worst pain ever                 Huston Foley

## 2022-02-13 NOTE — Anesthesia Postprocedure Evaluation (Signed)
Anesthesia Post Note  Patient: Valerie Huber  Procedure(s) Performed: RIGHT FOOT EXCISION OF MEDIAL CALCANEAL HYPERTROPHY, TALUS HYPERTROPHY, SUBTALAR JOINT ARTHRODESIS (Right: Foot)     Patient location during evaluation: Phase II Anesthesia Type: General Level of consciousness: awake and sedated Pain management: pain level controlled Vital Signs Assessment: post-procedure vital signs reviewed and stable Respiratory status: spontaneous breathing Cardiovascular status: stable Postop Assessment: no apparent nausea or vomiting Anesthetic complications: no   No notable events documented.  Last Vitals:  Vitals:   02/13/22 1103 02/13/22 1111  BP: (!) 102/56 90/60  Pulse: 77 76  Resp: 16   Temp: 36.8 C   SpO2: 96% 96%    Last Pain:  Vitals:   02/13/22 1111  TempSrc:   PainSc: 7                  John F [address]

## 2022-02-13 NOTE — H&P (Signed)
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 female who presents for preoperative history and physical with a diagnosis of right subtalar arthritis and bony hypertrophy.  Patient has been dealing with pain for several years.  She has failed injections, boot immobilization and anti-inflammatory use and is indicated for surgery given her advanced imaging findings. Symptoms are rated as moderate to severe, and have been worsening.  This is significantly impairing activities of daily living.  She has elected for surgical management.   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   Endometriosis    Irregular periods 12/31/2014   Pelvic pain in female 12/31/2014   Second degree uterine prolapse 12/31/2014   Seizures ([place])    per pt last seizure 2013  no meds   Vaginal Pap smear, abnormal    Past Surgical History:  Procedure Laterality Date   ANKLE SURGERY Right 09/14/2021   BIOPSY  07/06/2019   Procedure: BIOPSY;  Surgeon: Daneil Dolin, MD;  [facility]: [facility];  Service: Endoscopy;;  colon    COLONOSCOPY WITH PROPOFOL N/A 07/06/2019   one 3 mm polyp hyperplastic, segmental biopsies negative   DILITATION & CURRETTAGE/HYSTROSCOPY WITH NOVASURE ABLATION N/A 10/08/2017   Procedure: DILATATION & CURETTAGE/HYSTEROSCOPY WITH MINERVA  ENDOMETRIAL ABLATION (Procedure #2);  Surgeon: [name], MD;  Location: [facility];  Service: Gynecology;  Laterality: N/A;   ESOPHAGOGASTRODUODENOSCOPY N/A 12/10/2014   SLF: hematoemesis due to moderate erosive gastritis, duoedentitis, and duodenal ulcer   LEEP     POLYPECTOMY  07/06/2019   Procedure: POLYPECTOMY;  Surgeon: Daneil Dolin, MD;  [facility]: [facility];  Service: Endoscopy;;   surgery for endometriosis  2005   TUBAL LIGATION Bilateral 10/08/2017   Procedure: BILATERAL TUBAL STERILIZATION WITH FALLOPE RINGS APPLICATION  (Procedure #1);  Surgeon: [name], MD;  Location: [facility];  Service: Gynecology;  Laterality: Bilateral;   VAGINAL HYSTERECTOMY N/A 12/14/2020   Procedure: TOTAL HYSTERECTOMY VAGINAL;  Surgeon: Florian Buff, MD;  Location: [facility];  Service: Gynecology;  Laterality: N/A;   Social History   Socioeconomic History   Marital status: Single    Spouse name: Not on file   Number of children: Not on file   Years of education: Not on file   Highest education level: Not on file  Occupational History   Occupation: Housecleaning  Tobacco Use   Smoking status: Former    Packs/day: 0.25    Years: 14.00    Total pack years: 3.50    Types: Cigarettes   Smokeless tobacco: Never   Tobacco comments:    quit in 2017   Vaping Use   Vaping Use: Never used  Substance and Sexual Activity   Alcohol use: Not Currently    Comment: occ   Drug use: Not Currently    Types: Marijuana    Comment: previous   Sexual activity: Not Currently    Birth control/protection: Surgical    Comment: hyst  Other Topics Concern   Not on file  Social History Narrative   Not on file   Social Determinants of Health   Financial Resource Strain: Low Risk  (09/13/2021)   Overall Financial Resource Strain (CARDIA)    Difficulty of Paying Living Expenses: Not very hard  Food Insecurity: No Food Insecurity (06/23/2021)   Hunger Vital Sign    Worried About Running Out of Food in the  Last Year: Never true    [place] in the Last Year: Never true  Transportation Needs: No Transportation Needs (06/23/2021)   PRAPARE - Hydrologist (Medical): No    Lack of Transportation (Non-Medical): No  Physical Activity: Inactive (08/08/2021)   Exercise Vital Sign    Days of Exercise per Week: 0 days    Minutes of Exercise per Session: 0 min  Stress: Stress Concern Present (09/13/2021)   [place]    Feeling of Stress : Very much   Social Connections: Not on file   Family History  Problem Relation Age of Onset   Cancer Mother        kidney    Asthma Mother    Hypertension Mother    Colon polyps Sister        65, large polyp, done at [place] Paternal Grandmother    Asthma Son    Colon cancer Neg Hx    Allergies  Allergen Reactions   Flexeril [Cyclobenzaprine] Hives   Ibuprofen Anaphylaxis    Throat, eyes, lips swollen   Prior to Admission medications   Medication Sig Start Date End Date Taking? Authorizing Provider  oxyCODONE-acetaminophen (PERCOCET/ROXICET) 5-325 MG tablet Take 1 tablet by mouth every 6 (six) hours as needed. 01/26/22  Yes Lindell Spar, MD  pantoprazole (PROTONIX) 40 MG tablet TAKE 1 TABLET(40 MG) BY MOUTH DAILY 02/05/22  Yes Lindell Spar, MD     Positive ROS: All other systems have been reviewed and were otherwise negative with the exception of those mentioned in the HPI and as above.  Physical Exam:  Vitals:   02/13/22 0639  BP: 108/86  Pulse: 78  Resp: 20  Temp: 98.5 F (36.9 C)  SpO2: 100%   General: Alert, no acute distress Cardiovascular: No pedal edema Respiratory: No cyanosis, no use of accessory musculature GI: No organomegaly, abdomen is soft and non-tender Skin: No lesions in the area of chief complaint Neurologic: Sensation intact distally Psychiatric: Patient is competent for consent with normal mood and affect Lymphatic: No axillary or cervical lymphadenopathy  MUSCULOSKELETAL: Right leg with swelling and tenderness palpation along the hindfoot medially and laterally.  Limited hindfoot motion.  Tender to palpation within the sinus Tarsi.  No tenderness along the anterior ankle joint.  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  We discussed the risks, benefits and alternatives of surgery which include but are not limited to wound healing complications, infection, nonunion, malunion, need for further surgery, damage to surrounding structures and continued pain.  They understand there is no guarantees to an acceptable outcome.  After weighing these risks they opted to proceed with surgery.     Erle Crocker, MD    02/13/2022 7:22 AM

## 2022-02-13 NOTE — Anesthesia Preprocedure Evaluation (Addendum)
Anesthesia Evaluation  Patient identified by MRN, date of birth, ID band Patient awake    Reviewed: Allergy & Precautions, NPO status , Patient's Chart, lab work & pertinent test results  History of Anesthesia Complications Negative for: history of anesthetic complications  Airway Mallampati: II       Dental  (+) Dental Advisory Given, Teeth Intact   Pulmonary asthma , former smoker,    Pulmonary exam normal        Cardiovascular Exercise Tolerance: Good Normal cardiovascular exam     Neuro/Psych PSYCHIATRIC DISORDERS Depression  Neuromuscular disease    GI/Hepatic Neg liver ROS, GERD  Medicated and Controlled,(+)       marijuana use,   Endo/Other  negative endocrine ROS  Renal/GU negative Renal ROS     Musculoskeletal negative musculoskeletal ROS (+)   Abdominal Normal abdominal exam  (+)   Peds  Hematology negative hematology ROS (+)   Anesthesia Other Findings   Reproductive/Obstetrics negative OB ROS                             Anesthesia Physical  Anesthesia Plan  ASA: 2  Anesthesia Plan: General   Post-op Pain Management: Regional block*   Induction: Intravenous  PONV Risk Score and Plan: 4 or greater and Ondansetron, Dexamethasone and Midazolam  Airway Management Planned: LMA  Additional Equipment: None  Intra-op Plan:   Post-operative Plan: Extubation in OR  Informed Consent: I have reviewed the patients History and Physical, chart, labs and discussed the procedure including the risks, benefits and alternatives for the proposed anesthesia with the patient or authorized representative who has indicated his/her understanding and acceptance.     Dental advisory given  Plan Discussed with: CRNA  Anesthesia Plan Comments:        Anesthesia Quick Evaluation

## 2022-02-13 NOTE — Discharge Instructions (Addendum)
DR. Lucia Gaskins FOOT [facility] ANKLE SURGERY POST-OP INSTRUCTIONS   Pain Management The numbing medicine and your leg will last around 18 hours, take a dose of your pain medicine as soon as you feel it wearing off to avoid rebound pain. Keep your foot elevated above heart level.  Make sure that your heel hangs free ('floats'). Take all prescribed medication as directed. If taking narcotic pain medication you may want to use an over-the-counter stool softener to avoid constipation. You may take over-the-counter NSAIDs (ibuprofen, naproxen, etc.) as well as over-the-counter acetaminophen (can take after 6pm) as directed on the packaging as a supplement for your pain and may also use it to wean away from the prescription medication.  Activity Non-weightbearing Keep splint intact  First Postoperative Visit Your first postop visit will be at least 2 weeks after surgery.  This should be scheduled when you schedule surgery. If you do not have a postoperative visit scheduled please call [phone] to schedule an appointment. At the appointment your incision will be evaluated for suture removal, x-rays will be obtained if necessary.  General Instructions Swelling is very common after foot and ankle surgery.  It often takes 3 months for the foot and ankle to begin to feel comfortable.  Some amount of swelling will persist for 6-12 months. DO NOT change the dressing.  If there is a problem with the dressing (too tight, loose, gets wet, etc.) please contact Dr. Pollie Friar office. DO NOT get the dressing wet.  For showers you can use an over-the-counter cast cover or wrap a washcloth around the top of your dressing and then cover it with a plastic bag and tape it to your leg. DO NOT soak the incision (no tubs, pools, bath, etc.) until you have approval from Dr. Lucia Gaskins.  Contact Dr. Huel Cote office or go to Emergency Room if: Temperature above 101 F. Increasing pain that is unresponsive to pain medication or  elevation Excessive redness or swelling in your foot Dressing problems - excessive bloody drainage, looseness or tightness, or if dressing gets wet Develop pain, swelling, warmth, or discoloration of your calf   Post Anesthesia Home Care Instructions  Activity: Get plenty of rest for the remainder of the day. A responsible individual must stay with you for 24 hours following the procedure.  For the next 24 hours, DO NOT: -Drive a car -Paediatric nurse -Drink alcoholic beverages -Take any medication unless instructed by your physician -Make any legal decisions or sign important papers.  Meals: Start with liquid foods such as gelatin or soup. Progress to regular foods as tolerated. Avoid greasy, spicy, heavy foods. If nausea and/or vomiting occur, drink only clear liquids until the nausea and/or vomiting subsides. Call your physician if vomiting continues.  Special Instructions/Symptoms: Your throat may feel dry or sore from the anesthesia or the breathing tube placed in your throat during surgery. If this causes discomfort, gargle with warm salt water. The discomfort should disappear within 24 hours.  If you had a scopolamine patch placed behind your ear for the management of post- operative nausea and/or vomiting:  1. The medication in the patch is effective for 72 hours, after which it should be removed.  Wrap patch in a tissue and discard in the trash. Wash hands thoroughly with soap and water. 2. You may remove the patch earlier than 72 hours if you experience unpleasant side effects which may include dry mouth, dizziness or visual disturbances. 3. Avoid touching the patch. Wash your hands with soap and water  after contact with the patch.     Post Anesthesia Home Care Instructions  Activity: Get plenty of rest for the remainder of the day. A responsible individual must stay with you for 24 hours following the procedure.  For the next 24 hours, DO NOT: -Drive a car -Conservation officer, nature -Drink alcoholic beverages -Take any medication unless instructed by your physician -Make any legal decisions or sign important papers.  Meals: Start with liquid foods such as gelatin or soup. Progress to regular foods as tolerated. Avoid greasy, spicy, heavy foods. If nausea and/or vomiting occur, drink only clear liquids until the nausea and/or vomiting subsides. Call your physician if vomiting continues.  Special Instructions/Symptoms: Your throat may feel dry or sore from the anesthesia or the breathing tube placed in your throat during surgery. If this causes discomfort, gargle with warm salt water. The discomfort should disappear within 24 hours.  If you had a scopolamine patch placed behind your ear for the management of post- operative nausea and/or vomiting:  1. The medication in the patch is effective for 72 hours, after which it should be removed.  Wrap patch in a tissue and discard in the trash. Wash hands thoroughly with soap and water. 2. You may remove the patch earlier than 72 hours if you experience unpleasant side effects which may include dry mouth, dizziness or visual disturbances. 3. Avoid touching the patch. Wash your hands with soap and water after contact with the patch.    Post Anesthesia Home Care Instructions  Activity: Get plenty of rest for the remainder of the day. A responsible individual must stay with you for 24 hours following the procedure.  For the next 24 hours, DO NOT: -Drive a car -Paediatric nurse -Drink alcoholic beverages -Take any medication unless instructed by your physician -Make any legal decisions or sign important papers.  Meals: Start with liquid foods such as gelatin or soup. Progress to regular foods as tolerated. Avoid greasy, spicy, heavy foods. If nausea and/or vomiting occur, drink only clear liquids until the nausea and/or vomiting subsides. Call your physician if vomiting continues.  Special  Instructions/Symptoms: Your throat may feel dry or sore from the anesthesia or the breathing tube placed in your throat during surgery. If this causes discomfort, gargle with warm salt water. The discomfort should disappear within 24 hours.  If you had a scopolamine patch placed behind your ear for the management of post- operative nausea and/or vomiting:  1. The medication in the patch is effective for 72 hours, after which it should be removed.  Wrap patch in a tissue and discard in the trash. Wash hands thoroughly with soap and water. 2. You may remove the patch earlier than 72 hours if you experience unpleasant side effects which may include dry mouth, dizziness or visual disturbances. 3. Avoid touching the patch. Wash your hands with soap and water after contact with the patch.    Post Anesthesia Home Care Instructions  Activity: Get plenty of rest for the remainder of the day. A responsible individual must stay with you for 24 hours following the procedure.  For the next 24 hours, DO NOT: -Drive a car -Paediatric nurse -Drink alcoholic beverages -Take any medication unless instructed by your physician -Make any legal decisions or sign important papers.  Meals: Start with liquid foods such as gelatin or soup. Progress to regular foods as tolerated. Avoid greasy, spicy, heavy foods. If nausea and/or vomiting occur, drink only clear liquids until the nausea and/or vomiting subsides.  Call your physician if vomiting continues.  Special Instructions/Symptoms: Your throat may feel dry or sore from the anesthesia or the breathing tube placed in your throat during surgery. If this causes discomfort, gargle with warm salt water. The discomfort should disappear within 24 hours.  If you had a scopolamine patch placed behind your ear for the management of post- operative nausea and/or vomiting:  1. The medication in the patch is effective for 72 hours, after which it should be removed.  Wrap  patch in a tissue and discard in the trash. Wash hands thoroughly with soap and water. 2. You may remove the patch earlier than 72 hours if you experience unpleasant side effects which may include dry mouth, dizziness or visual disturbances. 3. Avoid touching the patch. Wash your hands with soap and water after contact with the patch.    Post Anesthesia Home Care Instructions  Activity: Get plenty of rest for the remainder of the day. A responsible individual must stay with you for 24 hours following the procedure.  For the next 24 hours, DO NOT: -Drive a car -Paediatric nurse -Drink alcoholic beverages -Take any medication unless instructed by your physician -Make any legal decisions or sign important papers.  Meals: Start with liquid foods such as gelatin or soup. Progress to regular foods as tolerated. Avoid greasy, spicy, heavy foods. If nausea and/or vomiting occur, drink only clear liquids until the nausea and/or vomiting subsides. Call your physician if vomiting continues.  Special Instructions/Symptoms: Your throat may feel dry or sore from the anesthesia or the breathing tube placed in your throat during surgery. If this causes discomfort, gargle with warm salt water. The discomfort should disappear within 24 hours.  If you had a scopolamine patch placed behind your ear for the management of post- operative nausea and/or vomiting:  1. The medication in the patch is effective for 72 hours, after which it should be removed.  Wrap patch in a tissue and discard in the trash. Wash hands thoroughly with soap and water. 2. You may remove the patch earlier than 72 hours if you experience unpleasant side effects which may include dry mouth, dizziness or visual disturbances. 3. Avoid touching the patch. Wash your hands with soap and water after contact with the patch.   Regional Anesthesia Blocks  1. Numbness or the inability to move the "blocked" extremity may last from 3-48 hours after  placement. The length of time depends on the medication injected and your individual response to the medication. If the numbness is not going away after 48 hours, call your surgeon.  2. The extremity that is blocked will need to be protected until the numbness is gone and the  Strength has returned. Because you cannot feel it, you will need to take extra care to avoid injury. Because it may be weak, you may have difficulty moving it or using it. You may not know what position it is in without looking at it while the block is in effect.  3. For blocks in the legs and feet, returning to weight bearing and walking needs to be done carefully. You will need to wait until the numbness is entirely gone and the strength has returned. You should be able to move your leg and foot normally before you try and bear weight or walk. You will need someone to be with you when you first try to ensure you do not fall and possibly risk injury.  4. Bruising and tenderness at the needle site are  common side effects and will resolve in a few days.  5. Persistent numbness or new problems with movement should be communicated to the surgeon or the [place] [phone] [place] [phone]).

## 2022-02-13 NOTE — Anesthesia Procedure Notes (Signed)
Procedure Name: LMA Insertion Date/Time: 02/13/2022 8:00 AM  Performed by: Maryella Shivers, CRNAPre-anesthesia Checklist: Patient identified, Emergency Drugs available, Suction available and Patient being monitored Patient Re-evaluated:Patient Re-evaluated prior to induction Oxygen Delivery Method: Circle system utilized Preoxygenation: Pre-oxygenation with 100% oxygen Induction Type: IV induction Ventilation: Mask ventilation without difficulty LMA: LMA inserted LMA Size: 4.0 Number of attempts: 1 Airway Equipment and Method: Bite block Placement Confirmation: positive ETCO2 Tube secured with: Tape Dental Injury: Teeth and Oropharynx as per pre-operative assessment

## 2022-02-13 NOTE — Anesthesia Procedure Notes (Signed)
Anesthesia Regional Block: Adductor canal block   Pre-Anesthetic Checklist: , timeout performed,  Correct Patient, Correct Site, Correct Laterality,  Correct Procedure, Correct Position, site marked,  Risks and benefits discussed,  Surgical consent,  Pre-op evaluation,  At surgeon's request and post-op pain management  Laterality: Lower and Right  Prep: chloraprep       Needles:  Injection technique: Single-shot  Needle Type: Echogenic Stimulator Needle     Needle Length: 9cm  Needle Gauge: 20   Needle insertion depth: 3 cm   Additional Needles:   Procedures:,,,, ultrasound used (permanent image in chart),,    Narrative:  Start time: 02/13/2022 7:32 AM End time: 02/13/2022 7:40 AM Injection made incrementally with aspirations every 5 mL.  Performed by: Personally  Anesthesiologist: Lyn Hollingshead, MD

## 2022-02-13 NOTE — Transfer of Care (Signed)
Immediate Anesthesia Transfer of Care Note  Patient: Valerie Huber  Procedure(s) Performed: RIGHT FOOT EXCISION OF MEDIAL CALCANEAL HYPERTROPHY, TALUS HYPERTROPHY, SUBTALAR JOINT ARTHRODESIS (Right: Foot)  Patient Location: PACU  Anesthesia Type:General  Level of Consciousness: sedated  Airway & Oxygen Therapy: Patient Spontanous Breathing and Patient connected to face mask oxygen  Post-op Assessment: Report given to RN and Post -op Vital signs reviewed and stable  Post vital signs: Reviewed and stable  Last Vitals:  Vitals Value Taken Time  BP 111/70 02/13/22 0952  Temp    Pulse 86 02/13/22 0952  Resp 18 02/13/22 0952  SpO2 99 % 02/13/22 0951  Vitals shown include unvalidated device data.  Last Pain:  Vitals:   02/13/22 0639  TempSrc: Oral  PainSc: 7       Patients Stated Pain Goal: 7 (61/60/73 7106)  Complications: No notable events documented.

## 2022-02-13 NOTE — Op Note (Signed)
Valerie Huber female 36 y.o. 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 female had longstanding pain along the lateral and medial aspect of her hindfoot on the right side.  She had advanced imaging studies consistent with subtalar coalition and arthrosis.  She failed conservative treatment the form of boot immobilization, injection, activity modification and anti-inflammatory use.  She was indicated for surgery.   Patient understood the risks, benefits and alternatives to surgery which include but are not limited to wound healing complications, infection, nonunion, malunion, need for further surgery as well as damage to surrounding structures. They also understood the potential for continued pain in that there were no guarantees of acceptable outcome After weighing these risks the patient opted to proceed with surgery.  PROCEDURE: Patient was identified in the preoperative holding area.  The right leg was marked by myself.  Consent was signed by myself and the patient.  Block was performed by anesthesia in the preoperative holding area.  Patient was taken to the operative suite and placed supine on the operative table.  General LMA anesthesia was induced without difficulty. Bump was placed under the operative hip and bone foam was used.  All bony  prominences were well padded.  Tourniquet was placed on the operative thigh.  Preoperative antibiotics were given. The extremity was prepped and draped in the usual sterile fashion and surgical timeout was performed.  The limb was elevated and the tourniquet was inflated to 250 mmHg.  We began by making an incision along the medial aspect of the subtalar joint just distal to the medial malleolus.  The incision was carried sharply down through skin and subcutaneous tissue.  Blunt dissection was used to mobilize skin flaps.  The retinacular tissue overlying posterior tibial tendon and flexor digitorum longus tendon was incised in line with the incision and the tendons were retracted.  There was a large area of bony prominence at the medial subtalar joint due to her coalition.  Using an osteotome and a rondure where the coalition in this area was resected.  This included portions of the talus and calcaneus bone.  The resected portions of the subtalar joint were discarded.  Through palpation and inspection adequate amount of the bony coalition was removed.  We then turned our attention to the lateral side.  An incision was made from the tip of the fibula down the course of the fourth ray to gain access to the subtalar joint.  The incision was carried sharply down through skin and subcutaneous tissue.  Blunt dissection was used to mobilize skin flaps and the retinacular tissue overlying the peroneal tendons where it was incised and opened up in line with the tendons.  The tendons were retracted.  The calcaneofibular ligament was transected and the subtalar joint was entered.  A lamina spreader was placed within the subtalar joint and the interosseous ligaments were transected to mobilize the joint further.  Then using an osteotome, rondure and a lamina spreader all of the cartilage surfaces from  the facets of the subtalar joint was removed.  Care was taken with a curette to fully mobilize the subtalar joint for good  bony apposition and fusion.  Then once the joints were prepped the joints were irrigated with normal saline and confirmed to be free of cartilage and fibrous tissue.  Then a drill was used to trephinate the bony surfaces for fusion.  Then Allosync bone graft was placed in the fusion sites.  Then the subtalar joint was reduced under direct visualization and the K wire was placed in the dorsal aspect of the talar neck across the posterior facet of the subtalar joint into the calcaneus.  It was confirmed on fluoroscopy to be adequately positioned.  Then a second K wire was placed from the lateral aspect of the calcaneal body across the posterior facet of the subtalar joint into the talus.  Again this was confirmed on fluoroscopy to be appropriately positioned.  The hindfoot was in slight valgus.  Simulated weightbearing was performed and the foot position was acceptable.  Then a small incision was made at the insertion site of the wire dorsally and laterally and screws were placed after drilling over the wires.  There was good purchase of the screws and maintenance of reduction of the subtalar joint.  Then fluoroscopy confirmed appropriate position of the screws and the screw threads were across the joints.  The wound was then irrigated.  Final fluoroscopic images were obtained.  The wounds were then closed in a layered fashion using 2-0 Vicryl, 3-0 Monocryl and 3-0 nylon suture.  Soft dressing and short leg splint was placed.  Tourniquet was released.  She was awakened from anesthesia and taken to recovery in stable condition.  No complications.    POST OPERATIVE INSTRUCTIONS: Nonweightbearing to operative extremity Follow-up in 2 weeks for suture removal, x-rays of the ankle on arrival.  Short leg cast will be placed Aspirin for DVT prophylaxis  TOURNIQUET TIME: Under 2 hours  BLOOD LOSS:  Minimal         DRAINS: none         SPECIMEN: none       COMPLICATIONS:  * No complications entered in OR  log *         Disposition: PACU - hemodynamically stable.         Condition: stable

## 2022-02-14 ENCOUNTER — Encounter (HOSPITAL_BASED_OUTPATIENT_CLINIC_OR_DEPARTMENT_OTHER): Payer: Self-pay | Admitting: Orthopaedic Surgery

## 2022-02-27 DIAGNOSIS — Z79891 Long term (current) use of opiate analgesic: Secondary | ICD-10-CM | POA: Diagnosis not present

## 2022-02-27 DIAGNOSIS — Z79899 Other long term (current) drug therapy: Secondary | ICD-10-CM | POA: Diagnosis not present

## 2022-02-27 DIAGNOSIS — G894 Chronic pain syndrome: Secondary | ICD-10-CM | POA: Diagnosis not present

## 2022-03-13 ENCOUNTER — Encounter: Payer: Medicaid Other | Admitting: Internal Medicine

## 2022-03-27 DIAGNOSIS — Z79899 Other long term (current) drug therapy: Secondary | ICD-10-CM | POA: Diagnosis not present

## 2022-03-27 DIAGNOSIS — Z79891 Long term (current) use of opiate analgesic: Secondary | ICD-10-CM | POA: Diagnosis not present

## 2022-03-27 DIAGNOSIS — G894 Chronic pain syndrome: Secondary | ICD-10-CM | POA: Diagnosis not present

## 2022-03-27 DIAGNOSIS — M25571 Pain in right ankle and joints of right foot: Secondary | ICD-10-CM | POA: Diagnosis not present

## 2022-03-30 DIAGNOSIS — F4310 Post-traumatic stress disorder, unspecified: Secondary | ICD-10-CM | POA: Diagnosis not present

## 2022-03-30 DIAGNOSIS — F411 Generalized anxiety disorder: Secondary | ICD-10-CM | POA: Diagnosis not present

## 2022-04-13 DIAGNOSIS — M19071 Primary osteoarthritis, right ankle and foot: Secondary | ICD-10-CM | POA: Diagnosis not present

## 2022-04-20 DIAGNOSIS — F4310 Post-traumatic stress disorder, unspecified: Secondary | ICD-10-CM | POA: Diagnosis not present

## 2022-04-20 DIAGNOSIS — F411 Generalized anxiety disorder: Secondary | ICD-10-CM | POA: Diagnosis not present

## 2022-04-23 ENCOUNTER — Telehealth: Payer: Self-pay | Admitting: Orthopaedic Surgery

## 2022-04-23 NOTE — Telephone Encounter (Signed)
Patient called Valerie Huber  10:15 am stating she heard we do PT at our office and wants [place] to please call her back   I called the patient back and Valerie Huber to return our call.

## 2022-04-27 DIAGNOSIS — F411 Generalized anxiety disorder: Secondary | ICD-10-CM | POA: Diagnosis not present

## 2022-04-27 DIAGNOSIS — F4310 Post-traumatic stress disorder, unspecified: Secondary | ICD-10-CM | POA: Diagnosis not present

## 2022-05-01 DIAGNOSIS — G894 Chronic pain syndrome: Secondary | ICD-10-CM | POA: Diagnosis not present

## 2022-05-01 DIAGNOSIS — Z79891 Long term (current) use of opiate analgesic: Secondary | ICD-10-CM | POA: Diagnosis not present

## 2022-05-01 DIAGNOSIS — M25571 Pain in right ankle and joints of right foot: Secondary | ICD-10-CM | POA: Diagnosis not present

## 2022-05-01 DIAGNOSIS — Z79899 Other long term (current) drug therapy: Secondary | ICD-10-CM | POA: Diagnosis not present

## 2022-05-05 ENCOUNTER — Other Ambulatory Visit: Payer: Self-pay | Admitting: Internal Medicine

## 2022-05-05 DIAGNOSIS — K920 Hematemesis: Secondary | ICD-10-CM

## 2022-05-06 ENCOUNTER — Other Ambulatory Visit: Payer: Self-pay

## 2022-05-06 ENCOUNTER — Emergency Department (HOSPITAL_COMMUNITY)
Admission: EM | Admit: 2022-05-06 | Discharge: 2022-05-06 | Discharge status: Against Medical Advice | Payer: Medicaid Other | Attending: Emergency Medicine | Admitting: Emergency Medicine

## 2022-05-06 ENCOUNTER — Encounter (HOSPITAL_COMMUNITY): Payer: Self-pay | Admitting: Emergency Medicine

## 2022-05-06 ENCOUNTER — Emergency Department (HOSPITAL_COMMUNITY): Payer: Medicaid Other

## 2022-05-06 DIAGNOSIS — J029 Acute pharyngitis, unspecified: Secondary | ICD-10-CM | POA: Insufficient documentation

## 2022-05-06 DIAGNOSIS — Z5321 Procedure and treatment not carried out due to patient leaving prior to being seen by health care provider: Secondary | ICD-10-CM | POA: Diagnosis not present

## 2022-05-06 DIAGNOSIS — R110 Nausea: Secondary | ICD-10-CM | POA: Insufficient documentation

## 2022-05-06 DIAGNOSIS — M791 Myalgia, unspecified site: Secondary | ICD-10-CM | POA: Insufficient documentation

## 2022-05-06 DIAGNOSIS — Z1152 Encounter for screening for COVID-19: Secondary | ICD-10-CM | POA: Diagnosis not present

## 2022-05-06 DIAGNOSIS — R509 Fever, unspecified: Secondary | ICD-10-CM | POA: Diagnosis not present

## 2022-05-06 DIAGNOSIS — H9203 Otalgia, bilateral: Secondary | ICD-10-CM | POA: Diagnosis not present

## 2022-05-06 DIAGNOSIS — R519 Headache, unspecified: Secondary | ICD-10-CM | POA: Insufficient documentation

## 2022-05-06 DIAGNOSIS — R000 Tachycardia, unspecified: Secondary | ICD-10-CM | POA: Diagnosis not present

## 2022-05-06 LAB — CBC WITH DIFFERENTIAL/PLATELET
Abs Immature Granulocytes: 0.02 10*3/uL (ref 0.00–0.07)
Basophils Absolute: 0 10*3/uL (ref 0.0–0.1)
Basophils Relative: 0 %
Eosinophils Absolute: 0 10*3/uL (ref 0.0–0.5)
Eosinophils Relative: 0 %
HCT: 34.3 % — ABNORMAL LOW (ref 36.0–46.0)
Hemoglobin: 11.3 g/dL — ABNORMAL LOW (ref 12.0–15.0)
Immature Granulocytes: 0 %
Lymphocytes Relative: 17 %
Lymphs Abs: 1.3 10*3/uL (ref 0.7–4.0)
MCH: 30.9 pg (ref 26.0–34.0)
MCHC: 32.9 g/dL (ref 30.0–36.0)
MCV: 93.7 fL (ref 80.0–100.0)
Monocytes Absolute: 0.7 10*3/uL (ref 0.1–1.0)
Monocytes Relative: 10 %
Neutro Abs: 5.3 10*3/uL (ref 1.7–7.7)
Neutrophils Relative %: 73 %
Platelets: 263 10*3/uL (ref 150–400)
RBC: 3.66 MIL/uL — ABNORMAL LOW (ref 3.87–5.11)
RDW: 11.8 % (ref 11.5–15.5)
WBC: 7.3 10*3/uL (ref 4.0–10.5)
nRBC: 0 % (ref 0.0–0.2)

## 2022-05-06 LAB — RESP PANEL BY RT-PCR (RSV, FLU A&B, COVID)  RVPGX2
Influenza A by PCR: NEGATIVE
Influenza B by PCR: NEGATIVE
Resp Syncytial Virus by PCR: NEGATIVE
SARS Coronavirus 2 by RT PCR: NEGATIVE

## 2022-05-06 LAB — COMPREHENSIVE METABOLIC PANEL
ALT: 14 U/L (ref 0–44)
AST: 15 U/L (ref 15–41)
Albumin: 4 g/dL (ref 3.5–5.0)
Alkaline Phosphatase: 66 U/L (ref 38–126)
Anion gap: 9 (ref 5–15)
BUN: 6 mg/dL (ref 6–20)
CO2: 24 mmol/L (ref 22–32)
Calcium: 8.9 mg/dL (ref 8.9–10.3)
Chloride: 100 mmol/L (ref 98–111)
Creatinine, Ser: 0.65 mg/dL (ref 0.44–1.00)
GFR, Estimated: >60 mL/min (ref >60)
Glucose, Bld: 104 mg/dL — ABNORMAL HIGH (ref 70–99)
Potassium: 3.4 mmol/L — ABNORMAL LOW (ref 3.5–5.1)
Sodium: 133 mmol/L — ABNORMAL LOW (ref 135–145)
Total Bilirubin: 0.4 mg/dL (ref 0.3–1.2)
Total Protein: 7.4 g/dL (ref 6.5–8.1)

## 2022-05-06 MED ORDER — ACETAMINOPHEN 500 MG PO TABS
1000.0000 mg | ORAL_TABLET | Freq: Once | ORAL | Status: AC
  Administered 2022-05-06: 1000 mg via ORAL
  Filled 2022-05-06: qty 2

## 2022-05-06 NOTE — ED Triage Notes (Signed)
Patient c/o fever with generalized body aches, headache, nausea, sore throat/swelling, and left ear fullness. Per patient started with ear fullness x2 weeks ago, then sore throat x6 days ago, body aches yesterday, and fever and headache today. Patient states fevers 104.1 at home. 103.2 in triage. Patient reports taking oxycodone '10mg'$ /acetaminophen '500mg'$  at 10:15am today with some relief.

## 2022-05-06 NOTE — ED Provider Triage Note (Signed)
Emergency Medicine Provider Triage Evaluation Note  Valerie Huber , a 36 y.o. female  was evaluated in triage.  Pt complains of bilateral ear pain x 2 weeks.  Developed generalized body aches, nausea, sore throat, headache yesterday.  Headache worse today.  Endorses fever at home of 104.  Takes 10 mg oxycodone at baseline.  Describes occasional cough that is mostly nonproductive.  Denies any persistent nausea or vomiting.  No shortness of breath.  She denies neck pain or stiffness  Review of Systems  Positive: Bilateral ear pain, sore throat, cough, body aches, frontal headache Negative: Vomiting, diarrhea, dysuria, shortness of breath neck pain or stiffness  Physical Exam  BP (S) (!) 135/108 (BP Location: Right Arm)   Pulse (!) 124   Temp (!) 103.2 F (39.6 C) (Oral)   Resp 20   Ht '5\' 4"'$  (1.626 m)   Wt 78 kg   LMP 12/08/2020   SpO2 100%   BMI 29.52 kg/m  Gen:   Awake, no distress   Resp:  Normal effort  MSK:   Moves extremities without difficulty  Other:  Cerumen bilateral ear canals, TM not visualized  Medical Decision Making  Medically screening exam initiated at 5:57 PM.  Appropriate orders placed.  Kaleena Corrow was informed that the remainder of the evaluation will be completed by another provider, this initial triage assessment does not replace that evaluation, and the importance of remaining in the ED until their evaluation is complete.     Kem Parkinson, PA-C 05/06/22 1800

## 2022-05-08 ENCOUNTER — Other Ambulatory Visit: Payer: Self-pay

## 2022-05-08 ENCOUNTER — Encounter (HOSPITAL_COMMUNITY): Payer: Self-pay

## 2022-05-08 ENCOUNTER — Emergency Department (HOSPITAL_COMMUNITY)
Admission: EM | Admit: 2022-05-08 | Discharge: 2022-05-08 | Disposition: A | Discharge status: Home / Self Care | Payer: Medicaid Other | Attending: Emergency Medicine | Admitting: Emergency Medicine

## 2022-05-08 DIAGNOSIS — Z20822 Contact with and (suspected) exposure to covid-19: Secondary | ICD-10-CM | POA: Diagnosis not present

## 2022-05-08 DIAGNOSIS — R519 Headache, unspecified: Secondary | ICD-10-CM | POA: Diagnosis present

## 2022-05-08 DIAGNOSIS — J020 Streptococcal pharyngitis: Secondary | ICD-10-CM | POA: Insufficient documentation

## 2022-05-08 LAB — RESP PANEL BY RT-PCR (RSV, FLU A&B, COVID)  RVPGX2
Influenza A by PCR: NEGATIVE
Influenza B by PCR: NEGATIVE
Resp Syncytial Virus by PCR: NEGATIVE
SARS Coronavirus 2 by RT PCR: NEGATIVE

## 2022-05-08 LAB — GROUP A STREP BY PCR: Group A Strep by PCR: DETECTED — AB

## 2022-05-08 MED ORDER — PENICILLIN G BENZATHINE 1200000 UNIT/2ML IM SUSY
1.2000 10*6.[IU] | PREFILLED_SYRINGE | Freq: Once | INTRAMUSCULAR | Status: AC
  Administered 2022-05-08: 1.2 10*6.[IU] via INTRAMUSCULAR
  Filled 2022-05-08: qty 2

## 2022-05-08 MED ORDER — ACETAMINOPHEN 325 MG PO TABS
650.0000 mg | ORAL_TABLET | Freq: Once | ORAL | Status: AC
  Administered 2022-05-08: 650 mg via ORAL
  Filled 2022-05-08: qty 2

## 2022-05-08 NOTE — ED Triage Notes (Signed)
Pt arrived from home via POV w c/o sore throat, head ache , and body aches times several days. States that she feels like she can not swallow even water.  10/10 on pain scale.

## 2022-05-08 NOTE — ED Provider Notes (Signed)
   [facility] EMERGENCY DEPARTMENT  Provider Note  CSN: [id number] Arrival date & time: 05/08/22 2992  History Chief Complaint  Patient presents with   Sore Throat    Valerie Huber is a 36 y.o. female reports 3 days of headache, body aches, and sore throat. Hurts to swallow all over. No cough or vomiting.    Home Medications Prior to Admission medications   Medication Sig Start Date End Date Taking? Authorizing Provider  pantoprazole (PROTONIX) 40 MG tablet TAKE 1 TABLET(40 MG) BY MOUTH DAILY 02/05/22   Lindell Spar, MD     Allergies    Flexeril [cyclobenzaprine] and Ibuprofen   Review of Systems   Review of Systems Please see HPI for pertinent positives and negatives  Physical Exam BP 121/88   Pulse (!) 112   Temp 98.5 F (36.9 C) (Oral)   Resp 18   LMP 12/08/2020   SpO2 96%   Physical Exam Vitals and nursing note reviewed.  Constitutional:      Appearance: Normal appearance.  HENT:     Head: Normocephalic and atraumatic.     Nose: Nose normal.     Mouth/Throat:     Mouth: Mucous membranes are moist.     Pharynx: Posterior oropharyngeal erythema present.     Tonsils: Tonsillar exudate present. No tonsillar abscesses. 2+ on the right. 2+ on the left.  Eyes:     Extraocular Movements: Extraocular movements intact.     Conjunctiva/sclera: Conjunctivae normal.  Cardiovascular:     Rate and Rhythm: Normal rate.  Pulmonary:     Effort: Pulmonary effort is normal.     Breath sounds: Normal breath sounds.  Abdominal:     General: Abdomen is flat.     Palpations: Abdomen is soft.     Tenderness: There is no abdominal tenderness.  Musculoskeletal:        General: No swelling. Normal range of motion.     Cervical back: Neck supple.  Lymphadenopathy:     Cervical: Cervical adenopathy present.  Skin:    General: Skin is warm and dry.  Neurological:     General: No focal deficit present.     Mental Status: She is alert.  Psychiatric:        Mood and  Affect: Mood normal.     ED Results / Procedures / Treatments   EKG None  Procedures Procedures  Medications Ordered in the ED Medications  penicillin g benzathine (BICILLIN LA) 1200000 UNIT/2ML injection 1.2 Million Units (has no administration in time range)  acetaminophen (TYLENOL) tablet 650 mg (has no administration in time range)    Initial Impression and Plan  Patient here with sore throat and body aches. Strep is positive. Exam not consistent with abscess. Does not have trismus or hot potato voice. Will give PCN here, APAP for pain and PCP follow up. RTED if worsening.   ED Course       MDM Rules/Calculators/A&P Medical Decision Making Problems Addressed: Strep pharyngitis: acute illness or injury  Amount and/or Complexity of Data Reviewed Labs: ordered. Decision-making details documented in ED Course.  Risk OTC drugs. Prescription drug management.    Final Clinical Impression(s) / ED Diagnoses Final diagnoses:  Strep pharyngitis    Rx / DC Orders ED Discharge Orders     None        Truddie Hidden, MD 05/08/22 [phone]

## 2022-05-10 ENCOUNTER — Ambulatory Visit: Payer: Medicaid Other | Admitting: Family Medicine

## 2022-05-10 ENCOUNTER — Encounter: Payer: Self-pay | Admitting: Internal Medicine

## 2022-05-10 ENCOUNTER — Telehealth (INDEPENDENT_AMBULATORY_CARE_PROVIDER_SITE_OTHER): Payer: Medicaid Other | Admitting: Internal Medicine

## 2022-05-10 DIAGNOSIS — J020 Streptococcal pharyngitis: Secondary | ICD-10-CM

## 2022-05-10 NOTE — Progress Notes (Signed)
   Acute Virtual Visit  Virtual Visit via Video Note  I connected with Karilynn Carranza on 05/10/22 at 10:20 AM EST by a video enabled telemedicine application and verified that I am speaking with the correct person using two identifiers.  Location: Patient: [address] [place], [place] [address] Provider: [address] [place], [place] [address]   I discussed the limitations of evaluation and management by telemedicine and the availability of in person appointments. The patient expressed understanding and agreed to proceed.  Chief Complaint  Patient presents with   Sore Throat    3-4 days, body aches, cough, fever up to 104. Went to [employer]. Neg for covid and flu. Tested positive for strep. Fever and aches better.Gave her a shot at the ER   History of Present Illness:  Ms. Keasling has been evaluated via virtual encounter today for follow-up of strep pharyngitis.  She initially presented to the emergency department on 12/24 endorsing a 2-week history of bilateral ear pain as well as generalized bodyaches, nausea, sore throat, headache, and fever with temperature up to 104F.  She left before being seen.  Her symptoms continued and she returned to the emergency department on 12/26 endorsing similar symptoms.  Respiratory panel was negative for COVID-19, influenza, and RSV.  Group A strep testing resulted positive.  She was treated with IM penicillin and discharged home.  Today Ms. Lammert states that her symptoms have significantly improved.  She states that she is "98% better".  She currently denies fever/chills, myalgias, and throat irritation.  Assessment and Plan:  Group A strep pharyngitis Group A strep positive on 12/26.  Treated with IM PCN, which is considered adequate treatment.  Symptoms have significantly improved.  No additional treatment is indicated currently.  She has follow-up with Dr. Posey Pronto scheduled for 05/17/2022.  Follow Up Instructions:  I discussed the  assessment and treatment plan with the patient. The patient was provided an opportunity to ask questions and all were answered. The patient agreed with the plan and demonstrated an understanding of the instructions.   The patient was advised to call back or seek an in-person evaluation if the symptoms worsen or if the condition fails to improve as anticipated.  I provided 7 minutes of non-face-to-face time during this encounter.   Johnette Abraham, MD

## 2022-05-17 ENCOUNTER — Encounter: Payer: Medicaid Other | Admitting: Internal Medicine

## 2022-05-18 DIAGNOSIS — F411 Generalized anxiety disorder: Secondary | ICD-10-CM | POA: Diagnosis not present

## 2022-05-18 DIAGNOSIS — F4310 Post-traumatic stress disorder, unspecified: Secondary | ICD-10-CM | POA: Diagnosis not present

## 2022-05-24 ENCOUNTER — Other Ambulatory Visit: Payer: Self-pay

## 2022-05-24 ENCOUNTER — Ambulatory Visit (HOSPITAL_COMMUNITY): Payer: Medicaid Other | Attending: Internal Medicine | Admitting: Physical Therapy

## 2022-05-24 ENCOUNTER — Encounter (HOSPITAL_COMMUNITY): Payer: Self-pay | Admitting: Physical Therapy

## 2022-05-24 DIAGNOSIS — M25571 Pain in right ankle and joints of right foot: Secondary | ICD-10-CM | POA: Diagnosis not present

## 2022-05-24 DIAGNOSIS — R2689 Other abnormalities of gait and mobility: Secondary | ICD-10-CM | POA: Insufficient documentation

## 2022-05-24 DIAGNOSIS — M6281 Muscle weakness (generalized): Secondary | ICD-10-CM

## 2022-05-24 DIAGNOSIS — R29898 Other symptoms and signs involving the musculoskeletal system: Secondary | ICD-10-CM | POA: Diagnosis not present

## 2022-05-24 NOTE — Therapy (Signed)
OUTPATIENT PHYSICAL THERAPY LOWER EXTREMITY EVALUATION   Patient Name: Lorine Iannaccone MRN: [id number] DOB:1985-08-29, 37 y.o., female Today's Date: 05/24/2022  END OF SESSION:  PT End of Session - 05/24/22 0942     Visit Number 1    Number of Visits 12    Date for PT Re-Evaluation 07/05/22    Authorization Type Medicaid healthy blue    Authorization Time Period 12 visits requested - check auth    Progress Note Due on Visit 10    PT Start Time 0945    PT Stop Time 1028    PT Time Calculation (min) 43 min    Activity Tolerance Patient tolerated treatment well;Patient limited by pain    Behavior During Therapy [facility] for tasks assessed/performed             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   Endometriosis    Irregular periods 12/31/2014   Pelvic pain in female 12/31/2014   Second degree uterine prolapse 12/31/2014   Seizures ([place])    per pt last seizure 2013  no meds   Vaginal Pap smear, abnormal    Past Surgical History:  Procedure Laterality Date   ANKLE SURGERY Right 09/14/2021   BIOPSY  07/06/2019   Procedure: BIOPSY;  Surgeon: Daneil Dolin, MD;  [facility];  Service: Endoscopy;;  colon    COLONOSCOPY WITH PROPOFOL N/A 07/06/2019   one 3 mm polyp hyperplastic, segmental biopsies negative   DILITATION & CURRETTAGE/HYSTROSCOPY WITH NOVASURE ABLATION N/A 10/08/2017   Procedure: DILATATION & CURETTAGE/HYSTEROSCOPY WITH MINERVA  ENDOMETRIAL ABLATION (Procedure #2);  Surgeon: [name], MD;  Location: [facility];  Service: Gynecology;  Laterality: N/A;   ESOPHAGOGASTRODUODENOSCOPY N/A 12/10/2014   SLF: hematoemesis due to moderate erosive gastritis, duoedentitis, and duodenal ulcer   FOOT ARTHRODESIS Right 02/13/2022   Procedure: RIGHT FOOT EXCISION OF MEDIAL CALCANEAL HYPERTROPHY, TALUS HYPERTROPHY, SUBTALAR JOINT ARTHRODESIS;  Surgeon: Erle Crocker, MD;  Location: [facility];   Service: Orthopedics;  Laterality: Right;   LEEP     POLYPECTOMY  07/06/2019   Procedure: POLYPECTOMY;  Surgeon: Daneil Dolin, MD;  [facility];  Service: Endoscopy;;   surgery for endometriosis  2005   TUBAL LIGATION Bilateral 10/08/2017   Procedure: BILATERAL TUBAL STERILIZATION WITH FALLOPE RINGS APPLICATION (Procedure #1);  Surgeon: [name], MD;  Location: [facility];  Service: Gynecology;  Laterality: Bilateral;   VAGINAL HYSTERECTOMY N/A 12/14/2020   Procedure: TOTAL HYSTERECTOMY VAGINAL;  Surgeon: Florian Buff, MD;  Location: [facility];  Service: Gynecology;  Laterality: N/A;   Patient Active Problem List   Diagnosis Date Noted   Acute right ankle pain 01/26/2022   Mass of upper outer quadrant of left breast 12/14/2021   Pain of left breast 12/14/2021   Gastroesophageal reflux disease without esophagitis 09/07/2021   OSA (obstructive sleep apnea) 09/07/2021   Dyspareunia, female    History of endometriosis    Chest pain 11/07/2020   Seasonal allergic rhinitis due to pollen 09/01/2020   Encounter for examination following treatment at hospital 04/12/2020   Chronic diarrhea 06/30/2019   Rectal bleeding    Complex regional pain syndrome type 1 of right lower extremity 05/20/2017   Depression 11/30/2015   H/O: substance abuse ([place]) 11/30/2015   History of seizures 11/30/2015   Marijuana use 07/05/2015   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/21/2014   Anaphylaxis 09/04/2011  PCP: Ihor Dow MD  REFERRING PROVIDER: Erle Crocker, MD  REFERRING DIAG: right subtalar joint fusion  THERAPY DIAG:  Pain in right ankle and joints of right foot  Muscle weakness (generalized)  Other abnormalities of gait and mobility  Other symptoms and signs involving the musculoskeletal system  Rationale for Evaluation and Treatment: Rehabilitation  ONSET DATE: DOS: 02/13/22  SUBJECTIVE:   SUBJECTIVE STATEMENT: Was having a lot of pain for a while. She  then had a fusion on 02/13/22. Pain is worse than it was before. She has to wear the boot even for short distance because of the pain. She was told she can try without use the boot but it is pain limited. Still having swelling and constant pain. Of balance sometimes and falling and has to use wall to catch herself. Has not had PT before.  PERTINENT HISTORY: Chronic R ankle pain, right subtalar joint fusion, CRPS PAIN:  Are you having pain? Yes: NPRS scale: 7/10 Pain location: ankle Pain description: burning, shooting, stabbing Aggravating factors: walking, standing Relieving factors: none  PRECAUTIONS: None  WEIGHT BEARING RESTRICTIONS: No  FALLS:  Has patient fallen in last 6 months? Yes. Number of falls 9  LIVING ENVIRONMENT: Lives with: lives with their family Lives in: House/apartment Stairs: No Has following equipment at home: Crutches and shower chair  OCCUPATION: unemployed  PLOF: Independent  PATIENT GOALS: to be able to use her ankle again  NEXT MD VISIT: End of January   OBJECTIVE:   DIAGNOSTIC FINDINGS: XR 02/13/22 IMPRESSION: Intraoperative fluoroscopic images of the right ankle demonstrate subtalar fusion. No obvious perihardware fracture or component malpositioning.  PATIENT SURVEYS: LEFS 7/80  COGNITION: Overall cognitive status: Within functional limits for tasks assessed     SENSATION: Light touch: Impaired  and decreased RLE from knee down  EDEMA: slight edema R ankle    PALPATION:  Grossly tender throughout RLE, hypersensitive  LOWER EXTREMITY ROM:  Active ROM Right eval Left eval  Hip flexion    Hip extension    Hip abduction    Hip adduction    Hip internal rotation    Hip external rotation    Knee flexion    Knee extension    Ankle dorsiflexion PROM 0* 12  Ankle plantarflexion PROM 15 * 45  Ankle inversion  25  Ankle eversion  10   (Blank rows = not tested) * = pain  LOWER EXTREMITY MMT: unable to fully assess R ankle due to  pain, no palpable contraction felt despite several attempts on RLE; Uses UE to move RLE  MMT Right eval Left eval  Hip flexion    Hip extension    Hip abduction    Hip adduction    Hip internal rotation    Hip external rotation    Knee flexion 3+* 5  Knee extension 3-* 5  Ankle dorsiflexion 0 4+  Ankle plantarflexion    Ankle inversion 0 4+  Ankle eversion 0 4+   (Blank rows = not tested)*= pain    FUNCTIONAL TESTS:  Transfer STS: labored, UE use, relies on LLE  GAIT: Distance walked: 100  feet Assistive device utilized:  unilateral axillary crutch Level of assistance: Modified independence Comments: unilateral axillary crutch, boot on R foot, antalgic   TODAY'S TREATMENT:  DATE:  05/24/22 Massage of RLE with hands, things of different textures to improve sensitivity    PATIENT EDUCATION:  Education details: Patient educated on exam findings, POC, scope of PT, HEP, and pain science. Person educated: Patient Education method: Explanation, Demonstration, and Handouts Education comprehension: verbalized understanding, returned demonstration, verbal cues required, and tactile cues required  HOME EXERCISE PROGRAM: Massage of RLE with hands, things of different textures   ASSESSMENT:  CLINICAL IMPRESSION: Patient a 37 y.o. y.o. female who was seen today for physical therapy evaluation and treatment for s/p right subtalar joint fusion. Patient presents with pain limited deficits in R ankle/foot strength, ROM, endurance, activity tolerance, gait, balance, hypersensitivity, central sensitization and functional mobility with ADL. Patient is having to modify and restrict ADL as indicated by outcome measure score as well as subjective information and objective measures which is affecting overall participation. Patient will benefit from skilled physical  therapy in order to improve function and reduce impairment.  OBJECTIVE IMPAIRMENTS: Abnormal gait, decreased activity tolerance, decreased balance, decreased mobility, difficulty walking, decreased ROM, decreased strength, impaired flexibility, improper body mechanics, and pain.   ACTIVITY LIMITATIONS: carrying, lifting, bending, standing, squatting, stairs, transfers, locomotion level, and caring for others  PARTICIPATION LIMITATIONS: meal prep, cleaning, laundry, shopping, community activity, occupation, and yard work  PERSONAL FACTORS: Time since onset of injury/illness/exacerbation and 1 comorbidity: chronic R ankle pain  are also affecting patient's functional outcome.   REHAB POTENTIAL: Good  CLINICAL DECISION MAKING: Evolving/moderate complexity  EVALUATION COMPLEXITY: Moderate   GOALS: Goals reviewed with patient? Yes  SHORT TERM GOALS: Target date: 06/14/2022    Patient will be independent with HEP in order to improve functional outcomes. Baseline: Goal status: INITIAL  2.  Patient will report at least 25% improvement in symptoms for improved quality of life. Baseline: Goal status: INITIAL   LONG TERM GOALS: Target date: 07/05/2022    Patient will report at least 75% improvement in symptoms for improved quality of life. Baseline:  Goal status: INITIAL  2.  Patient will improve LEFS score by at least 18 points in order to indicate improved tolerance to activity. Baseline: 7/80 Goal status: INITIAL  3.  Patient will be able to navigate stairs with reciprocal pattern without compensation in order to demonstrate improved LE strength. Baseline:  Goal status: INITIAL  4.  Patient will be able to ambulate at least 226 feet in 2MWT without boot or AD in order to demonstrate improved tolerance to activity. Baseline:  Goal status: INITIAL  5.  Patient will demonstrate grade of at least 4+/5 MMT grade in all tested musculature as evidence of improved strength to assist  with stair ambulation and gait.  Baseline: see above Goal status: INITIAL     PLAN:  PT FREQUENCY: 2x/week  PT DURATION: 6 weeks  PLANNED INTERVENTIONS: Therapeutic exercises, Therapeutic activity, Neuromuscular re-education, Balance training, Gait training, Patient/Family education, Joint manipulation, Joint mobilization, Stair training, Orthotic/Fit training, DME instructions, Aquatic Therapy, Dry Needling, Electrical stimulation, Spinal manipulation, Spinal mobilization, Cryotherapy, Moist heat, Compression bandaging, scar mobilization, Splintting, Taping, Traction, Ultrasound, Ionotophoresis '4mg'$ /ml Dexamethasone, and Manual therapy  PLAN FOR NEXT SESSION: f/u with HEP, decrease sensitivity, ankle PROM, progress mobility as tolerated, knee strength and mobility; seated/ table exercises and progress to standing as able   Mearl Latin, PT 05/24/2022, 10:51 AM

## 2022-05-25 DIAGNOSIS — F411 Generalized anxiety disorder: Secondary | ICD-10-CM | POA: Diagnosis not present

## 2022-05-25 DIAGNOSIS — F4310 Post-traumatic stress disorder, unspecified: Secondary | ICD-10-CM | POA: Diagnosis not present

## 2022-05-28 ENCOUNTER — Encounter (HOSPITAL_COMMUNITY): Payer: Medicaid Other | Admitting: Physical Therapy

## 2022-05-29 DIAGNOSIS — M25571 Pain in right ankle and joints of right foot: Secondary | ICD-10-CM | POA: Diagnosis not present

## 2022-05-29 DIAGNOSIS — G894 Chronic pain syndrome: Secondary | ICD-10-CM | POA: Diagnosis not present

## 2022-05-29 DIAGNOSIS — Z79899 Other long term (current) drug therapy: Secondary | ICD-10-CM | POA: Diagnosis not present

## 2022-05-29 DIAGNOSIS — Z79891 Long term (current) use of opiate analgesic: Secondary | ICD-10-CM | POA: Diagnosis not present

## 2022-05-30 ENCOUNTER — Encounter (HOSPITAL_COMMUNITY): Payer: Medicaid Other | Admitting: Physical Therapy

## 2022-05-31 ENCOUNTER — Encounter (HOSPITAL_COMMUNITY): Payer: Medicaid Other | Admitting: Physical Therapy

## 2022-06-01 DIAGNOSIS — F411 Generalized anxiety disorder: Secondary | ICD-10-CM | POA: Diagnosis not present

## 2022-06-01 DIAGNOSIS — F4310 Post-traumatic stress disorder, unspecified: Secondary | ICD-10-CM | POA: Diagnosis not present

## 2022-06-05 ENCOUNTER — Encounter (HOSPITAL_COMMUNITY): Payer: Self-pay

## 2022-06-05 ENCOUNTER — Ambulatory Visit (HOSPITAL_COMMUNITY): Payer: Medicaid Other | Admitting: Physical Therapy

## 2022-06-05 ENCOUNTER — Other Ambulatory Visit: Payer: Self-pay

## 2022-06-05 NOTE — Therapy (Signed)
OUTPATIENT PHYSICAL THERAPY LOWER EXTREMITY Treatment   Patient Name: Valerie Huber MRN: [id number] DOB:1985/09/13, 37 y.o., female Today's Date: 06/05/2022  END OF SESSION:  PT End of Session - 06/05/22 1119     Visit Number  1   Number of Visits 12    Date for PT Re-Evaluation 07/05/22    Authorization Type Medicaid healthy blue    Authorization Time Period 12 visits approved from 1/11 to 4/10    Authorization - Visit Number 0   Authorization - Number of Visits 12    Progress Note Due on Visit 10    PT Start Time 1120    PT Stop Time 1200    PT Time Calculation (min) 40 min    Activity Tolerance Patient tolerated treatment well;Patient limited by pain    Behavior During Therapy [facility] for tasks assessed/performed             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   Endometriosis    Irregular periods 12/31/2014   Pelvic pain in female 12/31/2014   Second degree uterine prolapse 12/31/2014   Seizures ([place])    per pt last seizure 2013  no meds   Vaginal Pap smear, abnormal    Past Surgical History:  Procedure Laterality Date   ANKLE SURGERY Right 09/14/2021   BIOPSY  07/06/2019   Procedure: BIOPSY;  Surgeon: Daneil Dolin, MD;  [facility];  Service: Endoscopy;;  colon    COLONOSCOPY WITH PROPOFOL N/A 07/06/2019   one 3 mm polyp hyperplastic, segmental biopsies negative   DILITATION & CURRETTAGE/HYSTROSCOPY WITH NOVASURE ABLATION N/A 10/08/2017   Procedure: DILATATION & CURETTAGE/HYSTEROSCOPY WITH MINERVA  ENDOMETRIAL ABLATION (Procedure #2);  Surgeon: [name], MD;  [facility];  Service: Gynecology;  Laterality: N/A;   ESOPHAGOGASTRODUODENOSCOPY N/A 12/10/2014   SLF: hematoemesis due to moderate erosive gastritis, duoedentitis, and duodenal ulcer   FOOT ARTHRODESIS Right 02/13/2022   Procedure: RIGHT FOOT EXCISION OF MEDIAL CALCANEAL HYPERTROPHY, TALUS HYPERTROPHY, SUBTALAR JOINT ARTHRODESIS;   Surgeon: Erle Crocker, MD;  Location: [facility];  Service: Orthopedics;  Laterality: Right;   LEEP     POLYPECTOMY  07/06/2019   Procedure: POLYPECTOMY;  Surgeon: Daneil Dolin, MD;  [facility];  Service: Endoscopy;;   surgery for endometriosis  2005   TUBAL LIGATION Bilateral 10/08/2017   Procedure: BILATERAL TUBAL STERILIZATION WITH FALLOPE RINGS APPLICATION (Procedure #1);  Surgeon: [name], MD;  [facility];  Service: Gynecology;  Laterality: Bilateral;   VAGINAL HYSTERECTOMY N/A 12/14/2020   Procedure: TOTAL HYSTERECTOMY VAGINAL;  Surgeon: Florian Buff, MD;  Location: [facility];  Service: Gynecology;  Laterality: N/A;   Patient Active Problem List   Diagnosis Date Noted   Acute right ankle pain 01/26/2022   Mass of upper outer quadrant of left breast 12/14/2021   Pain of left breast 12/14/2021   Gastroesophageal reflux disease without esophagitis 09/07/2021   OSA (obstructive sleep apnea) 09/07/2021   Dyspareunia, female    History of endometriosis    Chest pain 11/07/2020   Seasonal allergic rhinitis due to pollen 09/01/2020   Encounter for examination following treatment at hospital 04/12/2020   Chronic diarrhea 06/30/2019   Rectal bleeding    Complex regional pain syndrome type 1 of right lower extremity 05/20/2017   Depression 11/30/2015   H/O: substance abuse ([place]) 11/30/2015   History of seizures 11/30/2015   Marijuana use  07/05/2015   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/21/2014   Anaphylaxis 09/04/2011    PCP: Ihor Dow MD  REFERRING PROVIDER: Erle Crocker, MD  REFERRING DIAG: right subtalar joint fusion  THERAPY DIAG:  Pain in right ankle and joints of right foot  Muscle weakness (generalized)  Other abnormalities of gait and mobility  Other symptoms and signs involving the musculoskeletal system  Rationale for Evaluation and Treatment: Rehabilitation  ONSET DATE: DOS:  02/13/22  SUBJECTIVE:   SUBJECTIVE STATEMENT: Pt states that she has strep throat and had  102 fever yesterday.  Therapist explained that you need to be fever free for 24 hrs. Prior to returning for treatment.   PERTINENT HISTORY: Chronic R ankle pain, right subtalar joint fusion, CRPS PAIN:  Are you having pain? Yes: NPRS scale: 7/10 Pain location: ankle Pain description: burning, shooting, stabbing Aggravating factors: walking, standing Relieving factors: none  PRECAUTIONS: None  WEIGHT BEARING RESTRICTIONS: No  FALLS:  Has patient fallen in last 6 months? Yes. Number of falls 9  LIVING ENVIRONMENT: Lives with: lives with their family Lives in: House/apartment Stairs: No Has following equipment at home: Crutches and shower chair  OCCUPATION: unemployed  PLOF: Independent  PATIENT GOALS: to be able to use her ankle again  NEXT MD VISIT: End of January   OBJECTIVE:   DIAGNOSTIC FINDINGS: XR 02/13/22 IMPRESSION: Intraoperative fluoroscopic images of the right ankle demonstrate subtalar fusion. No obvious perihardware fracture or component malpositioning.  PATIENT SURVEYS: LEFS 7/80  COGNITION: Overall cognitive status: Within functional limits for tasks assessed     SENSATION: Light touch: Impaired  and decreased RLE from knee down  EDEMA: slight edema R ankle    PALPATION:  Grossly tender throughout RLE, hypersensitive  LOWER EXTREMITY ROM:  Active ROM Right eval Left eval  Hip flexion    Hip extension    Hip abduction    Hip adduction    Hip internal rotation    Hip external rotation    Knee flexion    Knee extension    Ankle dorsiflexion PROM 0* 12  Ankle plantarflexion PROM 15 * 45  Ankle inversion  25  Ankle eversion  10   (Blank rows = not tested) * = pain  LOWER EXTREMITY MMT: unable to fully assess R ankle due to pain, no palpable contraction felt despite several attempts on RLE; Uses UE to move RLE  MMT Right eval Left eval  Hip  flexion    Hip extension    Hip abduction    Hip adduction    Hip internal rotation    Hip external rotation    Knee flexion 3+* 5  Knee extension 3-* 5  Ankle dorsiflexion 0 4+  Ankle plantarflexion    Ankle inversion 0 4+  Ankle eversion 0 4+   (Blank rows = not tested)*= pain    FUNCTIONAL TESTS:  Transfer STS: labored, UE use, relies on LLE  GAIT: Distance walked: 100  feet Assistive device utilized:  unilateral axillary crutch Level of assistance: Modified independence Comments: unilateral axillary crutch, boot on R foot, antalgic   TODAY'S TREATMENT:  DATE:  06/05/22: no treatment given 05/24/22 Massage of RLE with hands, things of different textures to improve sensitivity    PATIENT EDUCATION:  Education details: Patient educated on exam findings, POC, scope of PT, HEP, and pain science. Person educated: Patient Education method: Explanation, Demonstration, and Handouts Education comprehension: verbalized understanding, returned demonstration, verbal cues required, and tactile cues required  HOME EXERCISE PROGRAM: Massage of RLE with hands, things of different textures   ASSESSMENT:  CLINICAL IMPRESSION: Patient a 37 y.o. y.o. female who was seen today for physical therapy evaluation and treatment for s/p right subtalar joint fusion. Patient presents with pain limited deficits in R ankle/foot strength, ROM, endurance, activity tolerance, gait, balance, hypersensitivity, central sensitization and functional mobility with ADL. Patient is having to modify and restrict ADL as indicated by outcome measure score as well as subjective information and objective measures which is affecting overall participation. Patient will benefit from skilled physical therapy in order to improve function and reduce impairment.  OBJECTIVE IMPAIRMENTS: Abnormal gait,  decreased activity tolerance, decreased balance, decreased mobility, difficulty walking, decreased ROM, decreased strength, impaired flexibility, improper body mechanics, and pain.   ACTIVITY LIMITATIONS: carrying, lifting, bending, standing, squatting, stairs, transfers, locomotion level, and caring for others  PARTICIPATION LIMITATIONS: meal prep, cleaning, laundry, shopping, community activity, occupation, and yard work  PERSONAL FACTORS: Time since onset of injury/illness/exacerbation and 1 comorbidity: chronic R ankle pain  are also affecting patient's functional outcome.   REHAB POTENTIAL: Good  CLINICAL DECISION MAKING: Evolving/moderate complexity  EVALUATION COMPLEXITY: Moderate   GOALS: Goals reviewed with patient? Yes  SHORT TERM GOALS: Target date: 06/14/2022    Patient will be independent with HEP in order to improve functional outcomes. Baseline: Goal status: INITIAL  2.  Patient will report at least 25% improvement in symptoms for improved quality of life. Baseline: Goal status: INITIAL   LONG TERM GOALS: Target date: 07/05/2022    Patient will report at least 75% improvement in symptoms for improved quality of life. Baseline:  Goal status: INITIAL  2.  Patient will improve LEFS score by at least 18 points in order to indicate improved tolerance to activity. Baseline: 7/80 Goal status: INITIAL  3.  Patient will be able to navigate stairs with reciprocal pattern without compensation in order to demonstrate improved LE strength. Baseline:  Goal status: INITIAL  4.  Patient will be able to ambulate at least 226 feet in 2MWT without boot or AD in order to demonstrate improved tolerance to activity. Baseline:  Goal status: INITIAL  5.  Patient will demonstrate grade of at least 4+/5 MMT grade in all tested musculature as evidence of improved strength to assist with stair ambulation and gait.  Baseline: see above Goal status: INITIAL     PLAN:  PT  FREQUENCY: 2x/week  PT DURATION: 6 weeks  PLANNED INTERVENTIONS: Therapeutic exercises, Therapeutic activity, Neuromuscular re-education, Balance training, Gait training, Patient/Family education, Joint manipulation, Joint mobilization, Stair training, Orthotic/Fit training, DME instructions, Aquatic Therapy, Dry Needling, Electrical stimulation, Spinal manipulation, Spinal mobilization, Cryotherapy, Moist heat, Compression bandaging, scar mobilization, Splintting, Taping, Traction, Ultrasound, Ionotophoresis '4mg'$ /ml Dexamethasone, and Manual therapy  PLAN FOR NEXT SESSION: f/u with HEP, decrease sensitivity, ankle PROM, progress mobility as tolerated, knee strength and mobility; seated/ table exercises and progress to standing as able   Azucena Freed PT/CLT [phone]  06/05/2022, 11:21 AM

## 2022-06-07 ENCOUNTER — Ambulatory Visit: Payer: Medicaid Other | Admitting: Internal Medicine

## 2022-06-07 ENCOUNTER — Encounter: Payer: Self-pay | Admitting: Internal Medicine

## 2022-06-07 VITALS — BP 114/78 | HR 100 | Ht 64.0 in | Wt 185.0 lb

## 2022-06-07 DIAGNOSIS — F330 Major depressive disorder, recurrent, mild: Secondary | ICD-10-CM | POA: Diagnosis not present

## 2022-06-07 DIAGNOSIS — Z2821 Immunization not carried out because of patient refusal: Secondary | ICD-10-CM

## 2022-06-07 DIAGNOSIS — K219 Gastro-esophageal reflux disease without esophagitis: Secondary | ICD-10-CM | POA: Diagnosis not present

## 2022-06-07 DIAGNOSIS — G90521 Complex regional pain syndrome I of right lower limb: Secondary | ICD-10-CM

## 2022-06-07 DIAGNOSIS — E782 Mixed hyperlipidemia: Secondary | ICD-10-CM

## 2022-06-07 DIAGNOSIS — Z1159 Encounter for screening for other viral diseases: Secondary | ICD-10-CM

## 2022-06-07 DIAGNOSIS — E559 Vitamin D deficiency, unspecified: Secondary | ICD-10-CM

## 2022-06-07 DIAGNOSIS — R739 Hyperglycemia, unspecified: Secondary | ICD-10-CM | POA: Diagnosis not present

## 2022-06-07 DIAGNOSIS — Z0001 Encounter for general adult medical examination with abnormal findings: Secondary | ICD-10-CM | POA: Diagnosis not present

## 2022-06-07 NOTE — Assessment & Plan Note (Signed)
Followed by orthopedic surgery Was on amitriptyline Referred to [facility] pain clinic - on Morphine and Oxycodone now

## 2022-06-07 NOTE — Patient Instructions (Signed)
Please continue taking Pantoprazole for acid reflux.  Please continue to follow low carb diet and ambulate as tolerated.

## 2022-06-07 NOTE — Assessment & Plan Note (Signed)
Likely worse recently due to chronic right foot pain Followed by [facility] therapist Advised to discuss about Cymbalta with her Psychiatry provider, may help with pain as well

## 2022-06-07 NOTE — Progress Notes (Signed)
Established Patient Office Visit  Subjective:  Patient ID: Valerie Huber, female    DOB: 1985/12/23  Age: 37 y.o. MRN: [id number]  CC:  Chief Complaint  Patient presents with   Annual Exam    HPI Valerie Huber is a 37 y.o. female with past medical history of complex regional pain syndrome of RLE, MDD and GERD who presents for annual physical.  She still complains of severe right foot pain despite having surgery.  She has been getting PT but still has her foot in orthotic support and has to use crutches for ambulation.  She is taking morphine and oxycodone for complex regional pain syndrome of RLE.  She has swelling of the RLE, but denies any numbness or tingling currently.  MDD: She sees [facility] therapist for it.  She is not on any medicine for it currently.  She has been feeling anxious lately due to her persistent right foot pain.  GERD: She takes pantoprazole for it.  Denies any nausea or vomiting currently.  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   Endometriosis    Irregular periods 12/31/2014   Pelvic pain in female 12/31/2014   Second degree uterine prolapse 12/31/2014   Seizures ([place])    per pt last seizure 2013  no meds   Vaginal Pap smear, abnormal     Past Surgical History:  Procedure Laterality Date   ANKLE SURGERY Right 09/14/2021   BIOPSY  07/06/2019   Procedure: BIOPSY;  Surgeon: Daneil Dolin, MD;  [facility];  Service: Endoscopy;;  colon    COLONOSCOPY WITH PROPOFOL N/A 07/06/2019   one 3 mm polyp hyperplastic, segmental biopsies negative   DILITATION & CURRETTAGE/HYSTROSCOPY WITH NOVASURE ABLATION N/A 10/08/2017   Procedure: DILATATION & CURETTAGE/HYSTEROSCOPY WITH MINERVA  ENDOMETRIAL ABLATION (Procedure #2);  Surgeon: [name], MD;  Location: [facility];  Service: Gynecology;  Laterality: N/A;   ESOPHAGOGASTRODUODENOSCOPY N/A 12/10/2014   SLF: hematoemesis due to  moderate erosive gastritis, duoedentitis, and duodenal ulcer   FOOT ARTHRODESIS Right 02/13/2022   Procedure: RIGHT FOOT EXCISION OF MEDIAL CALCANEAL HYPERTROPHY, TALUS HYPERTROPHY, SUBTALAR JOINT ARTHRODESIS;  Surgeon: Erle Crocker, MD;  Location: [place];  Service: Orthopedics;  Laterality: Right;   LEEP     POLYPECTOMY  07/06/2019   Procedure: POLYPECTOMY;  Surgeon: Daneil Dolin, MD;  [facility];  Service: Endoscopy;;   surgery for endometriosis  2005   TUBAL LIGATION Bilateral 10/08/2017   Procedure: BILATERAL TUBAL STERILIZATION WITH FALLOPE RINGS APPLICATION (Procedure #1);  Surgeon: [name], MD;  Location: [facility];  Service: Gynecology;  Laterality: Bilateral;   VAGINAL HYSTERECTOMY N/A 12/14/2020   Procedure: TOTAL HYSTERECTOMY VAGINAL;  Surgeon: Florian Buff, MD;  Location: [facility];  Service: Gynecology;  Laterality: N/A;    Family History  Problem Relation Age of Onset   Cancer Mother        kidney    Asthma Mother    Hypertension Mother    Colon polyps Sister        38, large polyp, done at 22    Cancer Paternal Grandmother    Asthma Son    Colon cancer Neg Hx     Social History   Socioeconomic History   Marital status: Single    Spouse name: Not on file   Number of children: Not on file   Years of education: Not on  file   Highest education level: Not on file  Occupational History   Occupation: Housecleaning  Tobacco Use   Smoking status: Former    Packs/day: 0.25    Years: 14.00    Total pack years: 3.50    Types: Cigarettes   Smokeless tobacco: Never   Tobacco comments:    quit in 2017   Vaping Use   Vaping Use: Never used  Substance and Sexual Activity   Alcohol use: Not Currently   Drug use: Not Currently    Types: Marijuana    Comment: previous -none in 8 months   Sexual activity: Not Currently    Birth control/protection: Surgical    Comment: hyst  Other Topics Concern   Not on file  Social  History Narrative   Not on file   Social Determinants of Health   Financial Resource Strain: Low Risk  (09/13/2021)   Overall Financial Resource Strain (CARDIA)    Difficulty of Paying Living Expenses: Not very hard  Food Insecurity: No Food Insecurity (06/23/2021)   Hunger Vital Sign    Worried About Running Out of Food in the Last Year: Never true    Ran Out of Food in the Last Year: Never true  Transportation Needs: No Transportation Needs (06/23/2021)   PRAPARE - Hydrologist (Medical): No    Lack of Transportation (Non-Medical): No  Physical Activity: Inactive (08/08/2021)   Exercise Vital Sign    Days of Exercise per Week: 0 days    Minutes of Exercise per Session: 0 min  Stress: Stress Concern Present (09/13/2021)   [place]    Feeling of Stress : Very much  Social Connections: Not on file  Intimate Partner Violence: Not At Risk (08/08/2021)   Humiliation, Afraid, Rape, and Kick questionnaire    Fear of Current or Ex-Partner: No    Emotionally Abused: No    Physically Abused: No    Sexually Abused: No    Outpatient Medications Prior to Visit  Medication Sig Dispense Refill   gabapentin (NEURONTIN) 300 MG capsule Take 300 mg by mouth every 8 (eight) hours.     methocarbamol (ROBAXIN) 500 MG tablet Take 500 mg by mouth every 8 (eight) hours.     morphine (MS CONTIN) 15 MG 12 hr tablet SMARTSIG:1 Tablet(s) By Mouth Every 12 Hours     Oxycodone HCl 10 MG TABS Take 10 mg by mouth every 6 (six) hours as needed.     pantoprazole (PROTONIX) 40 MG tablet TAKE 1 TABLET(40 MG) BY MOUTH DAILY 30 tablet 3   No facility-administered medications prior to visit.    Allergies  Allergen Reactions   Flexeril [Cyclobenzaprine] Hives   Ibuprofen Anaphylaxis    Throat, eyes, lips swollen    ROS Review of Systems  Constitutional:  Negative for chills and fever.  HENT:  Negative for congestion,  sinus pressure, sinus pain and sore throat.   Eyes:  Negative for pain and discharge.  Respiratory:  Negative for cough and shortness of breath.   Cardiovascular:  Negative for chest pain and palpitations.  Gastrointestinal:  Negative for abdominal pain, diarrhea, nausea and vomiting.  Endocrine: Negative for polydipsia and polyuria.  Genitourinary:  Negative for dysuria and hematuria.  Musculoskeletal:  Positive for arthralgias, back pain and gait problem. Negative for neck pain and neck stiffness.  Skin:  Negative for rash.  Neurological:  Negative for dizziness and weakness.  Psychiatric/Behavioral:  Positive  for sleep disturbance. Negative for agitation and behavioral problems. The patient is nervous/anxious.       Objective:    Physical Exam Vitals reviewed.  Constitutional:      General: She is not in acute distress.    Appearance: She is not diaphoretic.     Comments: Has crutches  HENT:     Head: Normocephalic and atraumatic.     Nose: Nose normal.     Mouth/Throat:     Mouth: Mucous membranes are moist.  Eyes:     General: No scleral icterus.    Extraocular Movements: Extraocular movements intact.  Cardiovascular:     Rate and Rhythm: Normal rate and regular rhythm.     Pulses: Normal pulses.     Heart sounds: Normal heart sounds. No murmur heard. Pulmonary:     Breath sounds: Normal breath sounds. No wheezing or rales.  Abdominal:     Palpations: Abdomen is soft.     Tenderness: There is no abdominal tenderness.  Musculoskeletal:     Cervical back: Neck supple. No tenderness.     Right lower leg: Edema (Over lateral side) present.     Comments: Orthotic shoe on right side  Skin:    General: Skin is warm.     Findings: No rash.     Comments: Scar in the right side of neck area - C/D/I  Neurological:     General: No focal deficit present.     Mental Status: She is alert and oriented to person, place, and time.     Sensory: No sensory deficit.     Motor: No  weakness.  Psychiatric:        Mood and Affect: Mood normal.        Behavior: Behavior normal.     BP 114/78 (BP Location: Left Arm, Patient Position: Sitting, Cuff Size: Large)   Pulse 100   Ht '5\' 4"'$  (1.626 m)   Wt 185 lb (83.9 kg)   LMP 12/08/2020   SpO2 93%   BMI 31.76 kg/m  Wt Readings from Last 3 Encounters:  06/07/22 185 lb (83.9 kg)  05/06/22 172 lb (78 kg)  02/13/22 178 lb 12.7 oz (81.1 kg)    Lab Results  Component Value Date   TSH 0.684 12/31/2014   Lab Results  Component Value Date   WBC 7.3 05/06/2022   HGB 11.3 (L) 05/06/2022   HCT 34.3 (L) 05/06/2022   MCV 93.7 05/06/2022   PLT 263 05/06/2022   Lab Results  Component Value Date   NA 133 (L) 05/06/2022   K 3.4 (L) 05/06/2022   CO2 24 05/06/2022   GLUCOSE 104 (H) 05/06/2022   BUN 6 05/06/2022   CREATININE 0.65 05/06/2022   BILITOT 0.4 05/06/2022   ALKPHOS 66 05/06/2022   AST 15 05/06/2022   ALT 14 05/06/2022   PROT 7.4 05/06/2022   ALBUMIN 4.0 05/06/2022   CALCIUM 8.9 05/06/2022   ANIONGAP 9 05/06/2022   No results found for: "CHOL" No results found for: "HDL" No results found for: "LDLCALC" No results found for: "TRIG" No results found for: "CHOLHDL" No results found for: "HGBA1C"    Assessment & Plan:   Problem List Items Addressed This Visit       Digestive   Gastroesophageal reflux disease without esophagitis   Relevant Orders   CBC with Differential/Platelet     Nervous and Auditory   Complex regional pain syndrome type 1 of right lower extremity    Followed  by orthopedic surgery Was on amitriptyline Referred to [facility] pain clinic - on Morphine and Oxycodone now      Relevant Orders   For home use only DME 4 wheeled rolling walker with seat ([id number])   TSH   CMP14+EGFR   CBC with Differential/Platelet     Other   MDD (major depressive disorder), recurrent episode ([place])    Likely worse recently due to chronic right foot pain Followed by [facility] therapist Advised to  discuss about Cymbalta with her Psychiatry provider, may help with pain as well      Encounter for general adult medical examination with abnormal findings - Primary    Physical exam as documented. Fasting blood tests today.      Relevant Orders   TSH   CMP14+EGFR   CBC with Differential/Platelet   Refused influenza vaccine   Other Visit Diagnoses     Mixed hyperlipidemia       Relevant Orders   Lipid Profile   Vitamin D deficiency       Relevant Orders   VITAMIN D 25 Hydroxy (Vit-D Deficiency, Fractures)   Hyperglycemia       Relevant Orders   Hemoglobin A1c   Need for hepatitis C screening test       Relevant Orders   Hepatitis C Antibody       No orders of the defined types were placed in this encounter.   Follow-up: Return in about 6 months (around 12/06/2022) for GERD.    Lindell Spar, MD

## 2022-06-07 NOTE — Assessment & Plan Note (Signed)
Physical exam as documented. Fasting blood tests today. 

## 2022-06-08 ENCOUNTER — Other Ambulatory Visit: Payer: Self-pay | Admitting: Internal Medicine

## 2022-06-08 DIAGNOSIS — F411 Generalized anxiety disorder: Secondary | ICD-10-CM | POA: Diagnosis not present

## 2022-06-08 DIAGNOSIS — E559 Vitamin D deficiency, unspecified: Secondary | ICD-10-CM | POA: Insufficient documentation

## 2022-06-08 DIAGNOSIS — F4310 Post-traumatic stress disorder, unspecified: Secondary | ICD-10-CM | POA: Diagnosis not present

## 2022-06-08 LAB — CMP14+EGFR
ALT: 11 IU/L (ref 0–32)
AST: 14 IU/L (ref 0–40)
Albumin/Globulin Ratio: 1.7 (ref 1.2–2.2)
Albumin: 4.5 g/dL (ref 3.9–4.9)
Alkaline Phosphatase: 82 IU/L (ref 44–121)
BUN/Creatinine Ratio: 11 (ref 9–23)
BUN: 9 mg/dL (ref 6–20)
Bilirubin Total: 0.2 mg/dL (ref 0.0–1.2)
CO2: 25 mmol/L (ref 20–29)
Calcium: 9.8 mg/dL (ref 8.7–10.2)
Chloride: 100 mmol/L (ref 96–106)
Creatinine, Ser: 0.79 mg/dL (ref 0.57–1.00)
Globulin, Total: 2.7 g/dL (ref 1.5–4.5)
Glucose: 95 mg/dL (ref 70–99)
Potassium: 4.2 mmol/L (ref 3.5–5.2)
Sodium: 139 mmol/L (ref 134–144)
Total Protein: 7.2 g/dL (ref 6.0–8.5)
eGFR: 98 mL/min/{1.73_m2} (ref >59)

## 2022-06-08 LAB — LIPID PANEL
Chol/HDL Ratio: 3.5 ratio (ref 0.0–4.4)
Cholesterol, Total: 192 mg/dL (ref 100–199)
HDL: 55 mg/dL (ref >39)
LDL Chol Calc (NIH): 117 mg/dL — ABNORMAL HIGH (ref 0–99)
Triglycerides: 110 mg/dL (ref 0–149)
VLDL Cholesterol Cal: 20 mg/dL (ref 5–40)

## 2022-06-08 LAB — HEPATITIS C ANTIBODY: Hep C Virus Ab: NONREACTIVE

## 2022-06-08 LAB — CBC WITH DIFFERENTIAL/PLATELET
Basophils Absolute: 0 10*3/uL (ref 0.0–0.2)
Basos: 0 %
EOS (ABSOLUTE): 0.1 10*3/uL (ref 0.0–0.4)
Eos: 1 %
Hematocrit: 35.2 % (ref 34.0–46.6)
Hemoglobin: 12 g/dL (ref 11.1–15.9)
Immature Grans (Abs): 0 10*3/uL (ref 0.0–0.1)
Immature Granulocytes: 0 %
Lymphocytes Absolute: 3.6 10*3/uL — ABNORMAL HIGH (ref 0.7–3.1)
Lymphs: 57 %
MCH: 31 pg (ref 26.6–33.0)
MCHC: 34.1 g/dL (ref 31.5–35.7)
MCV: 91 fL (ref 79–97)
Monocytes Absolute: 0.6 10*3/uL (ref 0.1–0.9)
Monocytes: 9 %
Neutrophils Absolute: 2.1 10*3/uL (ref 1.4–7.0)
Neutrophils: 33 %
Platelets: 304 10*3/uL (ref 150–450)
RBC: 3.87 x10E6/uL (ref 3.77–5.28)
RDW: 11.8 % (ref 11.7–15.4)
WBC: 6.4 10*3/uL (ref 3.4–10.8)

## 2022-06-08 LAB — HEMOGLOBIN A1C
Est. average glucose Bld gHb Est-mCnc: 120 mg/dL
Hgb A1c MFr Bld: 5.8 % — ABNORMAL HIGH (ref 4.8–5.6)

## 2022-06-08 LAB — VITAMIN D 25 HYDROXY (VIT D DEFICIENCY, FRACTURES): Vit D, 25-Hydroxy: 10.5 ng/mL — ABNORMAL LOW (ref 30.0–100.0)

## 2022-06-08 LAB — TSH: TSH: 1.14 u[IU]/mL (ref 0.450–4.500)

## 2022-06-08 MED ORDER — VITAMIN D (ERGOCALCIFEROL) 1.25 MG (50000 UNIT) PO CAPS: 50000.0000 [IU] | ORAL_CAPSULE | ORAL | 1 refills | Status: DC

## 2022-06-11 ENCOUNTER — Ambulatory Visit (HOSPITAL_COMMUNITY): Payer: Medicaid Other | Admitting: Physical Therapy

## 2022-06-11 ENCOUNTER — Other Ambulatory Visit: Payer: Self-pay

## 2022-06-11 DIAGNOSIS — M6281 Muscle weakness (generalized): Secondary | ICD-10-CM

## 2022-06-11 DIAGNOSIS — M25571 Pain in right ankle and joints of right foot: Secondary | ICD-10-CM | POA: Diagnosis not present

## 2022-06-11 DIAGNOSIS — R29898 Other symptoms and signs involving the musculoskeletal system: Secondary | ICD-10-CM

## 2022-06-11 DIAGNOSIS — R2689 Other abnormalities of gait and mobility: Secondary | ICD-10-CM | POA: Diagnosis not present

## 2022-06-11 NOTE — Therapy (Addendum)
OUTPATIENT PHYSICAL THERAPY LOWER EXTREMITY EVALUATION   Patient Name: Valerie Huber MRN: [id number] DOB:1985-07-20, 37 y.o., female Today's Date: 06/11/2022  END OF SESSION:  PT End of Session - 06/11/22 1659    Visit Number 2    Number of Visits 13    Date for PT Re-Evaluation 07/05/22    Authorization Type Medicaid healthy blue    Authorization Time Period 13 visits approved from 1/11 to 4/10    Authorization - Visit Number 2    Authorization - Number of Visits 13    Progress Note Due on Visit 10    PT Start Time 1608    PT Stop Time 1638    PT Time Calculation (min) 30 min    Activity Tolerance Limited by pain .   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   Endometriosis    Irregular periods 12/31/2014   Pelvic pain in female 12/31/2014   Second degree uterine prolapse 12/31/2014   Seizures ([place])    per pt last seizure 2013  no meds   Vaginal Pap smear, abnormal    Past Surgical History:  Procedure Laterality Date   ANKLE SURGERY Right 09/14/2021   BIOPSY  07/06/2019   Procedure: BIOPSY;  Surgeon: Daneil Dolin, MD;  [facility];  Service: Endoscopy;;  colon    COLONOSCOPY WITH PROPOFOL N/A 07/06/2019   one 3 mm polyp hyperplastic, segmental biopsies negative   DILITATION & CURRETTAGE/HYSTROSCOPY WITH NOVASURE ABLATION N/A 10/08/2017   Procedure: DILATATION & CURETTAGE/HYSTEROSCOPY WITH MINERVA  ENDOMETRIAL ABLATION (Procedure #2);  Surgeon: [name], MD;  [facility];  Service: Gynecology;  Laterality: N/A;   ESOPHAGOGASTRODUODENOSCOPY N/A 12/10/2014   SLF: hematoemesis due to moderate erosive gastritis, duoedentitis, and duodenal ulcer   FOOT ARTHRODESIS Right 02/13/2022   Procedure: RIGHT FOOT EXCISION OF MEDIAL CALCANEAL HYPERTROPHY, TALUS HYPERTROPHY, SUBTALAR JOINT ARTHRODESIS;  Surgeon:  Erle Crocker, MD;  Location: [place];  Service: Orthopedics;  Laterality: Right;   LEEP     POLYPECTOMY  07/06/2019   Procedure: POLYPECTOMY;  Surgeon: Daneil Dolin, MD;  [facility];  Service: Endoscopy;;   surgery for endometriosis  2005   TUBAL LIGATION Bilateral 10/08/2017   Procedure: BILATERAL TUBAL STERILIZATION WITH FALLOPE RINGS APPLICATION (Procedure #1);  Surgeon: [name], MD;  [facility];  Service: Gynecology;  Laterality: Bilateral;   VAGINAL HYSTERECTOMY N/A 12/14/2020   Procedure: TOTAL HYSTERECTOMY VAGINAL;  Surgeon: Florian Buff, MD;  [facility];  Service: Gynecology;  Laterality: N/A;   Patient Active Problem List   Diagnosis Date Noted   Vitamin D deficiency 06/08/2022   Encounter for general adult medical examination with abnormal findings 06/07/2022   Refused influenza vaccine 06/07/2022   Acute right ankle pain 01/26/2022   Mass of upper outer quadrant of left breast 12/14/2021   Gastroesophageal reflux disease without esophagitis 09/07/2021   OSA (obstructive sleep apnea) 09/07/2021   History of endometriosis    Seasonal allergic rhinitis due to pollen 09/01/2020   Encounter for examination following treatment at hospital 04/12/2020   Chronic diarrhea 06/30/2019   Rectal bleeding    Complex regional pain syndrome type 1 of right lower extremity 05/20/2017   MDD (major depressive disorder), recurrent episode ([place]) 11/30/2015   H/O: substance  abuse ([place]) 11/30/2015   History of seizures 11/30/2015   Marijuana use 07/05/2015   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/21/2014    PCP: Ihor Dow MD  REFERRING PROVIDER: Erle Crocker, MD  REFERRING DIAG: right subtalar joint fusion  THERAPY DIAG:  Pain in right ankle and joints of right foot  Muscle weakness (generalized)  Other abnormalities of gait and mobility  Other symptoms and signs involving the musculoskeletal  system  Rationale for Evaluation and Treatment: Rehabilitation  ONSET DATE: DOS: 02/13/22  SUBJECTIVE:   SUBJECTIVE STATEMENT: PT states that she was not given any actual exercises.  PT states that she can not put her foot on the ground as it is to painful.  Therapist explained that in her mind the pt must know that putting her foot on the ground is not going to cause any harm and until she does so she can not walk.  PERTINENT HISTORY: Chronic R ankle pain, right subtalar joint fusion, CRPS PAIN:  Are you having pain? Yes: NPRS scale: 8/10 Pain location: ankle Pain description: burning, shooting, stabbing Aggravating factors: walking, standing Relieving factors: none  PRECAUTIONS: None  WEIGHT BEARING RESTRICTIONS: No  FALLS:  Has patient fallen in last 6 months? Yes. Number of falls 9  LIVING ENVIRONMENT: Lives with: lives with their family Lives in: House/apartment Stairs: No Has following equipment at home: Crutches and shower chair  OCCUPATION: unemployed  PLOF: Independent  PATIENT GOALS: to be able to use her ankle again  NEXT MD VISIT: End of January   OBJECTIVE:   DIAGNOSTIC FINDINGS: XR 02/13/22 IMPRESSION: Intraoperative fluoroscopic images of the right ankle demonstrate subtalar fusion. No obvious perihardware fracture or component malpositioning.  PATIENT SURVEYS: LEFS 7/80  COGNITION: Overall cognitive status: Within functional limits for tasks assessed     SENSATION: Light touch: Impaired  and decreased RLE from knee down  EDEMA: slight edema R ankle    PALPATION:  Grossly tender throughout RLE, hypersensitive  LOWER EXTREMITY ROM:  Active ROM Right eval Left eval  Hip flexion    Hip extension    Hip abduction    Hip adduction    Hip internal rotation    Hip external rotation    Knee flexion    Knee extension    Ankle dorsiflexion PROM 0* 12  Ankle plantarflexion PROM 15 * 45  Ankle inversion  25  Ankle eversion  10   (Blank  rows = not tested) * = pain  LOWER EXTREMITY MMT: unable to fully assess R ankle due to pain, no palpable contraction felt despite several attempts on RLE; Uses UE to move RLE  MMT Right eval Left eval  Hip flexion    Hip extension    Hip abduction    Hip adduction    Hip internal rotation    Hip external rotation    Knee flexion 3+* 5  Knee extension 3-* 5  Ankle dorsiflexion 0 4+  Ankle plantarflexion    Ankle inversion 0 4+  Ankle eversion 0 4+   (Blank rows = not tested)*= pain    FUNCTIONAL TESTS:  Transfer STS: labored, UE use, relies on LLE  GAIT: Distance walked: 100  feet Assistive device utilized:  unilateral axillary crutch Level of assistance: Modified independence Comments: unilateral axillary crutch, boot on R foot, antalgic   TODAY'S TREATMENT:  DATE: 06/11/2022 Massage with tennis ball Sitting: AROM then self prom Inversion/eversion x 10 AROM then self PROM Dorsi/plantar flexion x 10 Towel crunch x 1 minutes  Toe spread x 10  Heel slide to improve dorsiflexion  05/24/22 Massage of RLE with hands, things of different textures to improve sensitivity    PATIENT EDUCATION:  Education details: Patient educated on exam findings, POC, scope of PT, HEP, and pain science. Person educated: Patient Education method: Explanation, Demonstration, and Handouts Education comprehension: verbalized understanding, returned demonstration, verbal cues required, and tactile cues required  HOME EXERCISE PROGRAM: Massage of RLE with hands, things of different textures   ASSESSMENT:  CLINICAL IMPRESSION: Patient a 37 y.o. y.o. female who was seen today for physical treatment for s/p right subtalar joint fusion. :Pt will not actively or passively move her ankle more than.5 cm.  Therapist spoke to patient that she has to remind herself that none of  the exercises that we are giving her will harm her, it may increase her pain temporarily but it will not harm her and she needs to push thru as she states that she has been doing these exercises for months yet her ROM is almost 0.  PT appears frustrated and states that she will try.   Therapist urged pt to force herself to put her foot on the ground and force herself to move her ankle with PROM to allow herself to progress in mobility.  Therapist gave pt home exercise program both Active and Passive.  PT has significant limitation in  ankle/foot strength, ROM, endurance, activity tolerance, gait, balance, hypersensitivity, central sensitization and functional mobility with ADL.  Patient will continue to benefit from skilled physical therapy in order to improve function and reduce impairment.  OBJECTIVE IMPAIRMENTS: Abnormal gait, decreased activity tolerance, decreased balance, decreased mobility, difficulty walking, decreased ROM, decreased strength, impaired flexibility, improper body mechanics, and pain.   ACTIVITY LIMITATIONS: carrying, lifting, bending, standing, squatting, stairs, transfers, locomotion level, and caring for others  PARTICIPATION LIMITATIONS: meal prep, cleaning, laundry, shopping, community activity, occupation, and yard work  PERSONAL FACTORS: Time since onset of injury/illness/exacerbation and 1 comorbidity: chronic R ankle pain  are also affecting patient's functional outcome.   REHAB POTENTIAL: Good  CLINICAL DECISION MAKING: Evolving/moderate complexity  EVALUATION COMPLEXITY: Moderate   GOALS: Goals reviewed with patient? Yes  SHORT TERM GOALS: Target date: 06/14/2022    Patient will be independent with HEP in order to improve functional outcomes. Baseline: Goal status: IN PROGRESS  2.  Patient will report at least 25% improvement in symptoms for improved quality of life. Baseline: Goal status: IN PROGRESS   LONG TERM GOALS: Target date: 07/05/2022     Patient will report at least 75% improvement in symptoms for improved quality of life. Baseline:  Goal status: IN PROGRESS  2.  Patient will improve LEFS score by at least 18 points in order to indicate improved tolerance to activity. Baseline: 7/80 Goal status: IN PROGRESS  3.  Patient will be able to navigate stairs with reciprocal pattern without compensation in order to demonstrate improved LE strength. Baseline:  Goal status: IN PROGRESS  4.  Patient will be able to ambulate at least 226 feet in 2MWT without boot or AD in order to demonstrate improved tolerance to activity. Baseline:  Goal status: IN PROGRESS  5.  Patient will demonstrate grade of at least 4+/5 MMT grade in all tested musculature as evidence of improved strength to assist with stair ambulation and gait.  Baseline:  see above Goal status: IN PROGRESS     PLAN:  PT FREQUENCY: 2x/week  PT DURATION: 6 weeks  PLANNED INTERVENTIONS: Therapeutic exercises, Therapeutic activity, Neuromuscular re-education, Balance training, Gait training, Patient/Family education, Joint manipulation, Joint mobilization, Stair training, Orthotic/Fit training, DME instructions, Aquatic Therapy, Dry Needling, Electrical stimulation, Spinal manipulation, Spinal mobilization, Cryotherapy, Moist heat, Compression bandaging, scar mobilization, Splintting, Taping, Traction, Ultrasound, Ionotophoresis '4mg'$ /ml Dexamethasone, and Manual therapy  PLAN FOR NEXT SESSION: possible modalities for pain.  Continue with de-sensitivity, ankle PROM, progress mobility as tolerated, knee strength and mobility; seated/ table exercises and progress to standing as able  Rayetta Humphrey, PT CLT [phone]  06/11/2022, 4:35 PM

## 2022-06-13 ENCOUNTER — Ambulatory Visit (HOSPITAL_COMMUNITY): Payer: Medicaid Other | Admitting: Physical Therapy

## 2022-06-13 DIAGNOSIS — R29898 Other symptoms and signs involving the musculoskeletal system: Secondary | ICD-10-CM

## 2022-06-13 DIAGNOSIS — M25571 Pain in right ankle and joints of right foot: Secondary | ICD-10-CM | POA: Diagnosis not present

## 2022-06-13 DIAGNOSIS — R2689 Other abnormalities of gait and mobility: Secondary | ICD-10-CM | POA: Diagnosis not present

## 2022-06-13 DIAGNOSIS — M6281 Muscle weakness (generalized): Secondary | ICD-10-CM | POA: Diagnosis not present

## 2022-06-13 NOTE — Therapy (Signed)
OUTPATIENT PHYSICAL THERAPY TREATMENT   Patient Name: Valerie Huber MRN: [id number] DOB:May 06, 1986, 37 y.o., female Today's Date: 06/13/2022  END OF SESSION:   PT End of Session - 06/13/22 1315     Visit Number 3    Number of Visits 13    Date for PT Re-Evaluation 07/05/22    Authorization Type Medicaid healthy blue    Authorization Time Period 13 visits approved from 1/11 to 4/10    Authorization - Visit Number 2    Authorization - Number of Visits 13    Progress Note Due on Visit 10    PT Start Time 3267    PT Stop Time 1345    PT Time Calculation (min) 40 min    Activity Tolerance --    Behavior During Therapy [facility] for tasks assessed/performed                  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   Endometriosis    Irregular periods 12/31/2014   Pelvic pain in female 12/31/2014   Second degree uterine prolapse 12/31/2014   Seizures ([place])    per pt last seizure 2013  no meds   Vaginal Pap smear, abnormal    Past Surgical History:  Procedure Laterality Date   ANKLE SURGERY Right 09/14/2021   BIOPSY  07/06/2019   Procedure: BIOPSY;  Surgeon: Daneil Dolin, MD;  [facility];  Service: Endoscopy;;  colon    COLONOSCOPY WITH PROPOFOL N/A 07/06/2019   one 3 mm polyp hyperplastic, segmental biopsies negative   DILITATION & CURRETTAGE/HYSTROSCOPY WITH NOVASURE ABLATION N/A 10/08/2017   Procedure: DILATATION & CURETTAGE/HYSTEROSCOPY WITH MINERVA  ENDOMETRIAL ABLATION (Procedure #2);  Surgeon: [name], MD;  Location: [facility];  Service: Gynecology;  Laterality: N/A;   ESOPHAGOGASTRODUODENOSCOPY N/A 12/10/2014   SLF: hematoemesis due to moderate erosive gastritis, duoedentitis, and duodenal ulcer   FOOT ARTHRODESIS Right 02/13/2022   Procedure: RIGHT FOOT EXCISION OF MEDIAL CALCANEAL HYPERTROPHY, TALUS HYPERTROPHY, SUBTALAR JOINT ARTHRODESIS;  Surgeon: Erle Crocker,  MD;  Location: [place];  Service: Orthopedics;  Laterality: Right;   LEEP     POLYPECTOMY  07/06/2019   Procedure: POLYPECTOMY;  Surgeon: Daneil Dolin, MD;  [facility];  Service: Endoscopy;;   surgery for endometriosis  2005   TUBAL LIGATION Bilateral 10/08/2017   Procedure: BILATERAL TUBAL STERILIZATION WITH FALLOPE RINGS APPLICATION (Procedure #1);  Surgeon: [name], MD;  Location: [facility];  Service: Gynecology;  Laterality: Bilateral;   VAGINAL HYSTERECTOMY N/A 12/14/2020   Procedure: TOTAL HYSTERECTOMY VAGINAL;  Surgeon: Florian Buff, MD;  Location: [facility];  Service: Gynecology;  Laterality: N/A;   Patient Active Problem List   Diagnosis Date Noted   Vitamin D deficiency 06/08/2022   Encounter for general adult medical examination with abnormal findings 06/07/2022   Refused influenza vaccine 06/07/2022   Acute right ankle pain 01/26/2022   Mass of upper outer quadrant of left breast 12/14/2021   Gastroesophageal reflux disease without esophagitis 09/07/2021   OSA (obstructive sleep apnea) 09/07/2021   History of endometriosis    Seasonal allergic rhinitis due to pollen 09/01/2020   Encounter for examination following treatment at hospital 04/12/2020   Chronic diarrhea 06/30/2019   Rectal bleeding    Complex regional pain syndrome type 1 of right lower extremity 05/20/2017   MDD (major depressive disorder), recurrent episode ([place]) 11/30/2015  H/O: substance abuse ([place]) 11/30/2015   History of seizures 11/30/2015   Marijuana use 07/05/2015   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/21/2014    PCP: Ihor Dow MD  REFERRING PROVIDER: Erle Crocker, MD  REFERRING DIAG: right subtalar joint fusion  THERAPY DIAG:  Pain in right ankle and joints of right foot  Muscle weakness (generalized)  Other abnormalities of gait and mobility  Other symptoms and signs involving the musculoskeletal system  Rationale for Evaluation and  Treatment: Rehabilitation  ONSET DATE: DOS: 02/13/22  SUBJECTIVE:   SUBJECTIVE STATEMENT: Pt reports 8/10 pain at rest and increases with weight bearing.  Comes today with 1 axillary crutch and wearing a walking boot on Rt LE    Evaluation:  PT states that she was not given any actual exercises.  PT states that she can not put her foot on the ground as it is to painful.  Therapist explained that in her mind the pt must know that putting her foot on the ground is not going to cause any harm and until she does so she can not walk.   PERTINENT HISTORY: Chronic R ankle pain, right subtalar joint fusion 5/23 and 10/23, CRPS PAIN:  Are you having pain? Yes: NPRS scale: 8/10 Pain location: ankle Pain description: burning, shooting, stabbing Aggravating factors: walking, standing Relieving factors: none  PRECAUTIONS: None  WEIGHT BEARING RESTRICTIONS: No  FALLS:  Has patient fallen in last 6 months? Yes. Number of falls 9  LIVING ENVIRONMENT: Lives with: lives with their family Lives in: House/apartment Stairs: No Has following equipment at home: Crutches and shower chair  OCCUPATION: unemployed  PLOF: Independent  PATIENT GOALS: to be able to use her ankle again  NEXT MD VISIT: End of January   OBJECTIVE:   DIAGNOSTIC FINDINGS: XR 02/13/22 IMPRESSION: Intraoperative fluoroscopic images of the right ankle demonstrate subtalar fusion. No obvious perihardware fracture or component malpositioning.  PATIENT SURVEYS: LEFS 7/80  COGNITION: Overall cognitive status: Within functional limits for tasks assessed     SENSATION: Light touch: Impaired  and decreased RLE from knee down  EDEMA: slight edema R ankle    PALPATION:  Grossly tender throughout RLE, hypersensitive  LOWER EXTREMITY ROM:  Active ROM Right eval Left eval  Hip flexion    Hip extension    Hip abduction    Hip adduction    Hip internal rotation    Hip external rotation    Knee flexion    Knee  extension    Ankle dorsiflexion PROM 0* 12  Ankle plantarflexion PROM 15 * 45  Ankle inversion  25  Ankle eversion  10   (Blank rows = not tested) * = pain  LOWER EXTREMITY MMT: unable to fully assess R ankle due to pain, no palpable contraction felt despite several attempts on RLE; Uses UE to move RLE  MMT Right eval Left eval  Hip flexion    Hip extension    Hip abduction    Hip adduction    Hip internal rotation    Hip external rotation    Knee flexion 3+* 5  Knee extension 3-* 5  Ankle dorsiflexion 0 4+  Ankle plantarflexion    Ankle inversion 0 4+  Ankle eversion 0 4+   (Blank rows = not tested)*= pain    FUNCTIONAL TESTS:  Transfer STS: labored, UE use, relies on LLE  GAIT: Distance walked: 100  feet Assistive device utilized:  unilateral axillary crutch Level of assistance: Modified independence Comments:  unilateral axillary crutch, boot on R foot, antalgic   TODAY'S TREATMENT:                                                                                                                              DATE:  06/13/22 Goal and HEP review Seated:  Heelslides with towel to increase DF 10X  Towel crunch 1 minute  Toe spread 1 minute  Towel IR/ER "windshield wipers" 10X  Heelraise/toeraises 10X each (min rise)  LAQ Rt 10X Standing with walker, boot off weight shifting to Rt 2X1 minute bouts Manual to Rt ankle; AA/PROM for all motions, desensitization    06/11/2022 Massage with tennis ball Sitting: AROM then self prom Inversion/eversion x 10 AROM then self PROM Dorsi/plantar flexion x 10 Towel crunch x 1 minutes  Toe spread x 10  Heel slide to improve dorsiflexion   05/24/22 Massage of RLE with hands, things of different textures to improve sensitivity    PATIENT EDUCATION:  Education details: Patient educated on exam findings, POC, scope of PT, HEP, and pain science. Person educated: Patient Education method: Explanation, Demonstration, and  Handouts Education comprehension: verbalized understanding, returned demonstration, verbal cues required, and tactile cues required  HOME EXERCISE PROGRAM: Evaluation: Massage of RLE with hands, things of different textures  Sitting: AROM then self prom Inversion/eversion x 10 AROM then self PROM Dorsi/plantar flexion x 10 Towel crunch x 1 minutes  Toe spread x 10  Heel slide to improve dorsiflexion  06/13/22 Standing weight shifts with UE assist, no boot  ASSESSMENT:  CLINICAL IMPRESSION: PT comes today ambulating with 1 axillary crutch and walking boot on Rt LE.  Began with established therex with minimal ROM completed with activities and noted pain behaviors. Progressed to standing weight shifts without boot and encouraged patient to add this to HEP.  Initial difficulty with LAQ but able to complete this with increased time.  Manual continued to Rt ankle with good tolerance to touch (no reaction or appearance of sensitivities), however reactive to passive movement.  Pt with empty feel in all planes of motion, only stopping due to pain voiced by patient.  Expressed to patient to work through to improve ROM as it appears to not be limited by any soft tissue or surgical structures.  No tightness or edema palpated today, no scar tissue along scar line present.  Patient will continue to benefit from skilled physical therapy in order to improve function and reduce impairment.  OBJECTIVE IMPAIRMENTS: Abnormal gait, decreased activity tolerance, decreased balance, decreased mobility, difficulty walking, decreased ROM, decreased strength, impaired flexibility, improper body mechanics, and pain.   ACTIVITY LIMITATIONS: carrying, lifting, bending, standing, squatting, stairs, transfers, locomotion level, and caring for others  PARTICIPATION LIMITATIONS: meal prep, cleaning, laundry, shopping, community activity, occupation, and yard work  PERSONAL FACTORS: Time since onset of  injury/illness/exacerbation and 1 comorbidity: chronic R ankle pain  are also affecting patient's functional outcome.   REHAB POTENTIAL: Good  CLINICAL  DECISION MAKING: Evolving/moderate complexity  EVALUATION COMPLEXITY: Moderate   GOALS: Goals reviewed with patient? Yes  SHORT TERM GOALS: Target date: 06/14/2022    Patient will be independent with HEP in order to improve functional outcomes. Baseline: Goal status: IN PROGRESS  2.  Patient will report at least 25% improvement in symptoms for improved quality of life. Baseline: Goal status: IN PROGRESS   LONG TERM GOALS: Target date: 07/05/2022    Patient will report at least 75% improvement in symptoms for improved quality of life. Baseline:  Goal status: IN PROGRESS  2.  Patient will improve LEFS score by at least 18 points in order to indicate improved tolerance to activity. Baseline: 7/80 Goal status: IN PROGRESS  3.  Patient will be able to navigate stairs with reciprocal pattern without compensation in order to demonstrate improved LE strength. Baseline:  Goal status: IN PROGRESS  4.  Patient will be able to ambulate at least 226 feet in 2MWT without boot or AD in order to demonstrate improved tolerance to activity. Baseline:  Goal status: IN PROGRESS  5.  Patient will demonstrate grade of at least 4+/5 MMT grade in all tested musculature as evidence of improved strength to assist with stair ambulation and gait.  Baseline: see above Goal status: IN PROGRESS     PLAN:  PT FREQUENCY: 2x/week  PT DURATION: 6 weeks  PLANNED INTERVENTIONS: Therapeutic exercises, Therapeutic activity, Neuromuscular re-education, Balance training, Gait training, Patient/Family education, Joint manipulation, Joint mobilization, Stair training, Orthotic/Fit training, DME instructions, Aquatic Therapy, Dry Needling, Electrical stimulation, Spinal manipulation, Spinal mobilization, Cryotherapy, Moist heat, Compression bandaging, scar  mobilization, Splintting, Taping, Traction, Ultrasound, Ionotophoresis '4mg'$ /ml Dexamethasone, and Manual therapy  PLAN FOR NEXT SESSION:  Continue with ankle ROM, general LE strength and stability.  Progress to standing exercises as able.  Reduce to [employer], PTA/CLT [place] Ph: [phone]   06/13/2022, 4:35 PM

## 2022-06-15 DIAGNOSIS — F411 Generalized anxiety disorder: Secondary | ICD-10-CM | POA: Diagnosis not present

## 2022-06-15 DIAGNOSIS — F4310 Post-traumatic stress disorder, unspecified: Secondary | ICD-10-CM | POA: Diagnosis not present

## 2022-06-19 ENCOUNTER — Encounter (HOSPITAL_COMMUNITY): Payer: Medicaid Other | Admitting: Physical Therapy

## 2022-06-21 ENCOUNTER — Encounter (HOSPITAL_COMMUNITY): Payer: Self-pay | Admitting: Physical Therapy

## 2022-06-21 ENCOUNTER — Ambulatory Visit (HOSPITAL_COMMUNITY): Payer: Medicaid Other | Attending: Internal Medicine | Admitting: Physical Therapy

## 2022-06-21 DIAGNOSIS — R2689 Other abnormalities of gait and mobility: Secondary | ICD-10-CM | POA: Diagnosis not present

## 2022-06-21 DIAGNOSIS — M6281 Muscle weakness (generalized): Secondary | ICD-10-CM | POA: Insufficient documentation

## 2022-06-21 DIAGNOSIS — R29898 Other symptoms and signs involving the musculoskeletal system: Secondary | ICD-10-CM | POA: Diagnosis not present

## 2022-06-21 DIAGNOSIS — M25571 Pain in right ankle and joints of right foot: Secondary | ICD-10-CM | POA: Diagnosis not present

## 2022-06-21 NOTE — Therapy (Signed)
OUTPATIENT PHYSICAL THERAPY TREATMENT   Patient Name: Valerie Huber MRN: [id number] DOB:[birth year], 37 y.o., female Today's Date: 06/21/2022  END OF SESSION:   PT End of Session - 06/21/22 1345     Visit Number 4    Number of Visits 13    Date for PT Re-Evaluation 07/05/22    Authorization Type Medicaid healthy blue    Authorization Time Period 13 visits approved from 1/11 to 4/10    Authorization - Visit Number 3    Authorization - Number of Visits 13    Progress Note Due on Visit 10    PT Start Time 3646    PT Stop Time 1431    PT Time Calculation (min) 46 min    Activity Tolerance Patient tolerated treatment well    Behavior During Therapy 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   Endometriosis    Irregular periods 12/31/2014   Pelvic pain in female 12/31/2014   Second degree uterine prolapse 12/31/2014   Seizures ([place])    per pt last seizure 2013  no meds   Vaginal Pap smear, abnormal    Past Surgical History:  Procedure Laterality Date   ANKLE SURGERY Right 09/14/2021   BIOPSY  07/06/2019   Procedure: BIOPSY;  Surgeon: Daneil Dolin, MD;  [facility];  Service: Endoscopy;;  colon    COLONOSCOPY WITH PROPOFOL N/A 07/06/2019   one 3 mm polyp hyperplastic, segmental biopsies negative   DILITATION & CURRETTAGE/HYSTROSCOPY WITH NOVASURE ABLATION N/A 10/08/2017   Procedure: DILATATION & CURETTAGE/HYSTEROSCOPY WITH MINERVA  ENDOMETRIAL ABLATION (Procedure #2);  Surgeon: [name], MD;  [facility];  Service: Gynecology;  Laterality: N/A;   ESOPHAGOGASTRODUODENOSCOPY N/A 12/10/2014   SLF: hematoemesis due to moderate erosive gastritis, duoedentitis, and duodenal ulcer   FOOT ARTHRODESIS Right 02/13/2022   Procedure: RIGHT FOOT EXCISION OF MEDIAL CALCANEAL HYPERTROPHY, TALUS HYPERTROPHY, SUBTALAR JOINT ARTHRODESIS;   Surgeon: Erle Crocker, MD;  Location: [place];  Service: Orthopedics;  Laterality: Right;   LEEP     POLYPECTOMY  07/06/2019   Procedure: POLYPECTOMY;  Surgeon: Daneil Dolin, MD;  [facility];  Service: Endoscopy;;   surgery for endometriosis  2005   TUBAL LIGATION Bilateral 10/08/2017   Procedure: BILATERAL TUBAL STERILIZATION WITH FALLOPE RINGS APPLICATION (Procedure #1);  Surgeon: [name], MD;  [facility];  Service: Gynecology;  Laterality: Bilateral;   VAGINAL HYSTERECTOMY N/A 12/14/2020   Procedure: TOTAL HYSTERECTOMY VAGINAL;  Surgeon: Florian Buff, MD;  Location: [facility];  Service: Gynecology;  Laterality: N/A;   Patient Active Problem List   Diagnosis Date Noted   Vitamin D deficiency 06/08/2022   Encounter for general adult medical examination with abnormal findings 06/07/2022   Refused influenza vaccine 06/07/2022   Acute right ankle pain 01/26/2022   Mass of upper outer quadrant of left breast 12/14/2021   Gastroesophageal reflux disease without esophagitis 09/07/2021   OSA (obstructive sleep apnea) 09/07/2021   History of endometriosis    Seasonal allergic rhinitis due to pollen 09/01/2020   Encounter for examination following treatment at hospital 04/12/2020   Chronic diarrhea 06/30/2019   Rectal bleeding    Complex regional pain syndrome type 1 of right lower extremity 05/20/2017   MDD (major depressive disorder), recurrent episode (  [place]) 11/30/2015   H/O: substance abuse ([place]) 11/30/2015   History of seizures 11/30/2015   Marijuana use 07/05/2015   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/21/2014    PCP: Ihor Dow MD  REFERRING PROVIDER: Erle Crocker, MD  REFERRING DIAG: right subtalar joint fusion  THERAPY DIAG:  Pain in right ankle and joints of right foot  Muscle weakness (generalized)  Other abnormalities of gait and mobility  Other symptoms and signs involving the musculoskeletal  system  Rationale for Evaluation and Treatment: Rehabilitation  ONSET DATE: DOS: 02/13/22  SUBJECTIVE:   SUBJECTIVE STATEMENT: Pt reports she has been walking with her shoe. She has been trying hard from some tough love.   Evaluation:  PT states that she was not given any actual exercises.  PT states that she can not put her foot on the ground as it is to painful.  Therapist explained that in her mind the pt must know that putting her foot on the ground is not going to cause any harm and until she does so she can not walk.   PERTINENT HISTORY: Chronic R ankle pain, right subtalar joint fusion 5/23 and 10/23, CRPS PAIN:  Are you having pain? Yes: NPRS scale: 7/10 Pain location: ankle Pain description: burning, shooting, stabbing Aggravating factors: walking, standing Relieving factors: none  PRECAUTIONS: None  WEIGHT BEARING RESTRICTIONS: No  FALLS:  Has patient fallen in last 6 months? Yes. Number of falls 9  LIVING ENVIRONMENT: Lives with: lives with their family Lives in: House/apartment Stairs: No Has following equipment at home: Crutches and shower chair  OCCUPATION: unemployed  PLOF: Independent  PATIENT GOALS: to be able to use her ankle again  NEXT MD VISIT: End of January   OBJECTIVE:   DIAGNOSTIC FINDINGS: XR 02/13/22 IMPRESSION: Intraoperative fluoroscopic images of the right ankle demonstrate subtalar fusion. No obvious perihardware fracture or component malpositioning.  PATIENT SURVEYS: LEFS 7/80  COGNITION: Overall cognitive status: Within functional limits for tasks assessed     SENSATION: Light touch: Impaired  and decreased RLE from knee down  EDEMA: slight edema R ankle    PALPATION:  Grossly tender throughout RLE, hypersensitive  LOWER EXTREMITY ROM:  Active ROM Right eval Left eval  Hip flexion    Hip extension    Hip abduction    Hip adduction    Hip internal rotation    Hip external rotation    Knee flexion    Knee  extension    Ankle dorsiflexion PROM 0* 12  Ankle plantarflexion PROM 15 * 45  Ankle inversion  25  Ankle eversion  10   (Blank rows = not tested) * = pain  LOWER EXTREMITY MMT: unable to fully assess R ankle due to pain, no palpable contraction felt despite several attempts on RLE; Uses UE to move RLE  MMT Right eval Left eval  Hip flexion    Hip extension    Hip abduction    Hip adduction    Hip internal rotation    Hip external rotation    Knee flexion 3+* 5  Knee extension 3-* 5  Ankle dorsiflexion 0 4+  Ankle plantarflexion    Ankle inversion 0 4+  Ankle eversion 0 4+   (Blank rows = not tested)*= pain    FUNCTIONAL TESTS:  Transfer STS: labored, UE use, relies on LLE  GAIT: Distance walked: 100  feet Assistive device utilized:  unilateral axillary crutch Level of assistance: Modified independence Comments: unilateral axillary crutch, boot on  R foot, antalgic   TODAY'S TREATMENT:                                                                                                                              DATE:  06/21/22 Seated Towel crunch 2x  1 minute Seated towel inv/ev 2x 1 minute Manual: STM to gastroc, anterior tibialis, AAROM/PROM with holds at end range positions Seated heel slides for ankle DF 1 x 10 with 5- 10 second holds Seated heel raises 2x 10 Seated TR 2 x 10  Standing lateral weight shifts 1 HHA 1x 10 bilateral Standing anterior/posterior weight shift in stepping position 1 x 10 each leg anterior  06/13/22 Goal and HEP review Seated:  Heelslides with towel to increase DF 10X  Towel crunch 1 minute  Toe spread 1 minute  Towel IR/ER "windshield wipers" 10X  Heelraise/toeraises 10X each (min rise)  LAQ Rt 10X Standing with walker, boot off weight shifting to Rt 2X1 minute bouts Manual to Rt ankle; AA/PROM for all motions, desensitization    06/11/2022 Massage with tennis ball Sitting: AROM then self prom Inversion/eversion x 10 AROM then  self PROM Dorsi/plantar flexion x 10 Towel crunch x 1 minutes  Toe spread x 10  Heel slide to improve dorsiflexion   05/24/22 Massage of RLE with hands, things of different textures to improve sensitivity    PATIENT EDUCATION:  Education details: Patient educated on exam findings, POC, scope of PT, HEP, and pain science. Person educated: Patient Education method: Explanation, Demonstration, and Handouts Education comprehension: verbalized understanding, returned demonstration, verbal cues required, and tactile cues required  HOME EXERCISE PROGRAM: Evaluation: Massage of RLE with hands, things of different textures  Sitting: AROM then self prom Inversion/eversion x 10 AROM then self PROM Dorsi/plantar flexion x 10 Towel crunch x 1 minutes  Toe spread x 10  Heel slide to improve dorsiflexion  06/13/22 Standing weight shifts with UE assist, no boot  Access Code: [email or web address] Date: 06/21/2022 - Standing Weight Shift  - 3 x daily - 7 x weekly - 3 sets - 10 reps - Seated Heel Raise  - 3 x daily - 7 x weekly - 3 sets - 10 reps - Seated Toe Raise  - 3 x daily - 7 x weekly - 3 sets - 10 reps - Stride Stance Weight Shift  - 1 x daily - 7 x weekly - 3 sets - 10 reps  ASSESSMENT:  CLINICAL IMPRESSION: Patient demonstrating improving ambulation ability with ambulating with sneaker with unilateral cane, continued gait mechanic impairment but greatly improving. Patient also demonstrating improving mobility and activity tolerance with high motivation levels. Demonstrates improving ROM with all exercises and manual today. Improving weight shifting tolerance. Progressing well.  Patient will continue to benefit from skilled physical therapy in order to improve function and reduce impairment.  OBJECTIVE IMPAIRMENTS: Abnormal gait, decreased activity tolerance, decreased balance, decreased mobility, difficulty walking, decreased ROM, decreased strength, impaired flexibility, improper body  mechanics, and  pain.   ACTIVITY LIMITATIONS: carrying, lifting, bending, standing, squatting, stairs, transfers, locomotion level, and caring for others  PARTICIPATION LIMITATIONS: meal prep, cleaning, laundry, shopping, community activity, occupation, and yard work  PERSONAL FACTORS: Time since onset of injury/illness/exacerbation and 1 comorbidity: chronic R ankle pain  are also affecting patient's functional outcome.   REHAB POTENTIAL: Good  CLINICAL DECISION MAKING: Evolving/moderate complexity  EVALUATION COMPLEXITY: Moderate   GOALS: Goals reviewed with patient? Yes  SHORT TERM GOALS: Target date: 06/14/2022    Patient will be independent with HEP in order to improve functional outcomes. Baseline: Goal status: IN PROGRESS  2.  Patient will report at least 25% improvement in symptoms for improved quality of life. Baseline: Goal status: IN PROGRESS   LONG TERM GOALS: Target date: 07/05/2022    Patient will report at least 75% improvement in symptoms for improved quality of life. Baseline:  Goal status: IN PROGRESS  2.  Patient will improve LEFS score by at least 18 points in order to indicate improved tolerance to activity. Baseline: 7/80 Goal status: IN PROGRESS  3.  Patient will be able to navigate stairs with reciprocal pattern without compensation in order to demonstrate improved LE strength. Baseline:  Goal status: IN PROGRESS  4.  Patient will be able to ambulate at least 226 feet in 2MWT without boot or AD in order to demonstrate improved tolerance to activity. Baseline:  Goal status: IN PROGRESS  5.  Patient will demonstrate grade of at least 4+/5 MMT grade in all tested musculature as evidence of improved strength to assist with stair ambulation and gait.  Baseline: see above Goal status: IN PROGRESS     PLAN:  PT FREQUENCY: 2x/week  PT DURATION: 6 weeks  PLANNED INTERVENTIONS: Therapeutic exercises, Therapeutic activity, Neuromuscular re-education, Balance  training, Gait training, Patient/Family education, Joint manipulation, Joint mobilization, Stair training, Orthotic/Fit training, DME instructions, Aquatic Therapy, Dry Needling, Electrical stimulation, Spinal manipulation, Spinal mobilization, Cryotherapy, Moist heat, Compression bandaging, scar mobilization, Splintting, Taping, Traction, Ultrasound, Ionotophoresis '4mg'$ /ml Dexamethasone, and Manual therapy  PLAN FOR NEXT SESSION:  Continue with ankle ROM, general LE strength and stability.  Progress to standing exercises as able.  Reduce to [place]  2:32 PM, 06/21/22 Mearl Latin PT, DPT Physical Therapist at [facility]

## 2022-06-22 DIAGNOSIS — F4310 Post-traumatic stress disorder, unspecified: Secondary | ICD-10-CM | POA: Diagnosis not present

## 2022-06-22 DIAGNOSIS — F411 Generalized anxiety disorder: Secondary | ICD-10-CM | POA: Diagnosis not present

## 2022-06-25 ENCOUNTER — Encounter (HOSPITAL_COMMUNITY): Payer: Self-pay | Admitting: Physical Therapy

## 2022-06-25 ENCOUNTER — Ambulatory Visit (HOSPITAL_COMMUNITY): Payer: Medicaid Other | Admitting: Physical Therapy

## 2022-06-25 DIAGNOSIS — M6281 Muscle weakness (generalized): Secondary | ICD-10-CM | POA: Diagnosis not present

## 2022-06-25 DIAGNOSIS — R2689 Other abnormalities of gait and mobility: Secondary | ICD-10-CM

## 2022-06-25 DIAGNOSIS — M25571 Pain in right ankle and joints of right foot: Secondary | ICD-10-CM

## 2022-06-25 DIAGNOSIS — R29898 Other symptoms and signs involving the musculoskeletal system: Secondary | ICD-10-CM | POA: Diagnosis not present

## 2022-06-25 NOTE — Therapy (Signed)
OUTPATIENT PHYSICAL THERAPY TREATMENT   Patient Name: Valerie Huber MRN: [id number] DOB:Jan 21, 1986, 37 y.o., female Today's Date: 06/25/2022  END OF SESSION:   PT End of Session - 06/25/22 1432     Visit Number 5    Number of Visits 13    Date for PT Re-Evaluation 07/05/22    Authorization Type Medicaid healthy blue    Authorization Time Period 13 visits approved from 1/11 to 4/10    Authorization - Visit Number 4    Authorization - Number of Visits 13    Progress Note Due on Visit 10    PT Start Time [id number]    PT Stop Time 1511    PT Time Calculation (min) 38 min    Activity Tolerance Patient tolerated treatment well    Behavior During Therapy [facility] for tasks assessed/performed                  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   Endometriosis    Irregular periods 12/31/2014   Pelvic pain in female 12/31/2014   Second degree uterine prolapse 12/31/2014   Seizures ([place])    per pt last seizure 2013  no meds   Vaginal Pap smear, abnormal    Past Surgical History:  Procedure Laterality Date   ANKLE SURGERY Right 09/14/2021   BIOPSY  07/06/2019   Procedure: BIOPSY;  Surgeon: Daneil Dolin, MD;  [facility];  Service: Endoscopy;;  colon    COLONOSCOPY WITH PROPOFOL N/A 07/06/2019   one 3 mm polyp hyperplastic, segmental biopsies negative   DILITATION & CURRETTAGE/HYSTROSCOPY WITH NOVASURE ABLATION N/A 10/08/2017   Procedure: DILATATION & CURETTAGE/HYSTEROSCOPY WITH MINERVA  ENDOMETRIAL ABLATION (Procedure #2);  Surgeon: [name], MD;  Location: [facility];  Service: Gynecology;  Laterality: N/A;   ESOPHAGOGASTRODUODENOSCOPY N/A 12/10/2014   SLF: hematoemesis due to moderate erosive gastritis, duoedentitis, and duodenal ulcer   FOOT ARTHRODESIS Right 02/13/2022   Procedure: RIGHT FOOT EXCISION OF MEDIAL CALCANEAL HYPERTROPHY, TALUS HYPERTROPHY, SUBTALAR JOINT ARTHRODESIS;   Surgeon: Erle Crocker, MD;  Location: [place];  Service: Orthopedics;  Laterality: Right;   LEEP     POLYPECTOMY  07/06/2019   Procedure: POLYPECTOMY;  Surgeon: Daneil Dolin, MD;  [facility];  Service: Endoscopy;;   surgery for endometriosis  2005   TUBAL LIGATION Bilateral 10/08/2017   Procedure: BILATERAL TUBAL STERILIZATION WITH FALLOPE RINGS APPLICATION (Procedure #1);  Surgeon: [name], MD;  Location: [facility];  Service: Gynecology;  Laterality: Bilateral;   VAGINAL HYSTERECTOMY N/A 12/14/2020   Procedure: TOTAL HYSTERECTOMY VAGINAL;  Surgeon: Florian Buff, MD;  Location: [facility];  Service: Gynecology;  Laterality: N/A;   Patient Active Problem List   Diagnosis Date Noted   Vitamin D deficiency 06/08/2022   Encounter for general adult medical examination with abnormal findings 06/07/2022   Refused influenza vaccine 06/07/2022   Acute right ankle pain 01/26/2022   Mass of upper outer quadrant of left breast 12/14/2021   Gastroesophageal reflux disease without esophagitis 09/07/2021   OSA (obstructive sleep apnea) 09/07/2021   History of endometriosis    Seasonal allergic rhinitis due to pollen 09/01/2020   Encounter for examination following treatment at hospital 04/12/2020   Chronic diarrhea 06/30/2019   Rectal bleeding    Complex regional pain syndrome type 1 of right lower extremity 05/20/2017   MDD (major depressive disorder), recurrent episode (  [place]) 11/30/2015   H/O: substance abuse ([place]) 11/30/2015   History of seizures 11/30/2015   Marijuana use 07/05/2015   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/21/2014    PCP: Ihor Dow MD  REFERRING PROVIDER: Erle Crocker, MD  REFERRING DIAG: right subtalar joint fusion  THERAPY DIAG:  Pain in right ankle and joints of right foot  Muscle weakness (generalized)  Other abnormalities of gait and mobility  Other symptoms and signs involving the musculoskeletal  system  Rationale for Evaluation and Treatment: Rehabilitation  ONSET DATE: DOS: 02/13/22  SUBJECTIVE:   SUBJECTIVE STATEMENT: Pt reports she slipped and fell yesterday while wearing her tennis shoe due to grease on kitchen floor.   Evaluation:  PT states that she was not given any actual exercises.  PT states that she can not put her foot on the ground as it is to painful.  Therapist explained that in her mind the pt must know that putting her foot on the ground is not going to cause any harm and until she does so she can not walk.   PERTINENT HISTORY: Chronic R ankle pain, right subtalar joint fusion 5/23 and 10/23, CRPS PAIN:  Are you having pain? Yes: NPRS scale: 10/10 Pain location: ankle Pain description: burning, shooting, stabbing Aggravating factors: walking, standing Relieving factors: none  PRECAUTIONS: None  WEIGHT BEARING RESTRICTIONS: No  FALLS:  Has patient fallen in last 6 months? Yes. Number of falls 9  LIVING ENVIRONMENT: Lives with: lives with their family Lives in: House/apartment Stairs: No Has following equipment at home: Crutches and shower chair  OCCUPATION: unemployed  PLOF: Independent  PATIENT GOALS: to be able to use her ankle again  NEXT MD VISIT: End of January   OBJECTIVE:   DIAGNOSTIC FINDINGS: XR 02/13/22 IMPRESSION: Intraoperative fluoroscopic images of the right ankle demonstrate subtalar fusion. No obvious perihardware fracture or component malpositioning.  PATIENT SURVEYS: LEFS 7/80  COGNITION: Overall cognitive status: Within functional limits for tasks assessed     SENSATION: Light touch: Impaired  and decreased RLE from knee down  EDEMA: slight edema R ankle    PALPATION:  Grossly tender throughout RLE, hypersensitive  LOWER EXTREMITY ROM:  Active ROM Right eval Left eval  Hip flexion    Hip extension    Hip abduction    Hip adduction    Hip internal rotation    Hip external rotation    Knee flexion     Knee extension    Ankle dorsiflexion PROM 0* 12  Ankle plantarflexion PROM 15 * 45  Ankle inversion  25  Ankle eversion  10   (Blank rows = not tested) * = pain  LOWER EXTREMITY MMT: unable to fully assess R ankle due to pain, no palpable contraction felt despite several attempts on RLE; Uses UE to move RLE  MMT Right eval Left eval  Hip flexion    Hip extension    Hip abduction    Hip adduction    Hip internal rotation    Hip external rotation    Knee flexion 3+* 5  Knee extension 3-* 5  Ankle dorsiflexion 0 4+  Ankle plantarflexion    Ankle inversion 0 4+  Ankle eversion 0 4+   (Blank rows = not tested)*= pain    FUNCTIONAL TESTS:  Transfer STS: labored, UE use, relies on LLE  GAIT: Distance walked: 100  feet Assistive device utilized:  unilateral axillary crutch Level of assistance: Modified independence Comments: unilateral axillary crutch, boot on  R foot, antalgic   TODAY'S TREATMENT:                                                                                                                              DATE:  06/25/22 Seated Towel crunch 3x  1 minute Seated towel inv/ev 3x 1 minute Seated heel slides for ankle DF 3 x 10 with 5- 10 second holds Seated heel raises 3x 10 Seated TR 3x 10    06/21/22 Seated Towel crunch 2x  1 minute Seated towel inv/ev 2x 1 minute Manual: STM to gastroc, anterior tibialis, AAROM/PROM with holds at end range positions Seated heel slides for ankle DF 1 x 10 with 5- 10 second holds Seated heel raises 2x 10 Seated TR 2 x 10  Standing lateral weight shifts 1 HHA 1x 10 bilateral Standing anterior/posterior weight shift in stepping position 1 x 10 each leg anterior  06/13/22 Goal and HEP review Seated:  Heelslides with towel to increase DF 10X  Towel crunch 1 minute  Toe spread 1 minute  Towel IR/ER "windshield wipers" 10X  Heelraise/toeraises 10X each (min rise)  LAQ Rt 10X Standing with walker, boot off weight shifting  to Rt 2X1 minute bouts Manual to Rt ankle; AA/PROM for all motions, desensitization    06/11/2022 Massage with tennis ball Sitting: AROM then self prom Inversion/eversion x 10 AROM then self PROM Dorsi/plantar flexion x 10 Towel crunch x 1 minutes  Toe spread x 10  Heel slide to improve dorsiflexion   05/24/22 Massage of RLE with hands, things of different textures to improve sensitivity    PATIENT EDUCATION:  Education details: Patient educated on exam findings, POC, scope of PT, HEP, and pain science. Person educated: Patient Education method: Explanation, Demonstration, and Handouts Education comprehension: verbalized understanding, returned demonstration, verbal cues required, and tactile cues required  HOME EXERCISE PROGRAM: Evaluation: Massage of RLE with hands, things of different textures  Sitting: AROM then self prom Inversion/eversion x 10 AROM then self PROM Dorsi/plantar flexion x 10 Towel crunch x 1 minutes  Toe spread x 10  Heel slide to improve dorsiflexion  06/13/22 Standing weight shifts with UE assist, no boot  Access Code: [email or web address] Date: 06/21/2022 - Standing Weight Shift  - 3 x daily - 7 x weekly - 3 sets - 10 reps - Seated Heel Raise  - 3 x daily - 7 x weekly - 3 sets - 10 reps - Seated Toe Raise  - 3 x daily - 7 x weekly - 3 sets - 10 reps - Stride Stance Weight Shift  - 1 x daily - 7 x weekly - 3 sets - 10 reps  ASSESSMENT:  CLINICAL IMPRESSION: Patient arrives with bilateral axillary crutches and walking boot due to increased symptoms following fall. Patient limited to mostly seated exercises due to symptoms.  Able to complete additional reps of previously completed exercises. Ended session early due to pain and fatigue.  Patient will continue  to benefit from skilled physical therapy in order to improve function and reduce impairment.  OBJECTIVE IMPAIRMENTS: Abnormal gait, decreased activity tolerance, decreased balance, decreased mobility,  difficulty walking, decreased ROM, decreased strength, impaired flexibility, improper body mechanics, and pain.   ACTIVITY LIMITATIONS: carrying, lifting, bending, standing, squatting, stairs, transfers, locomotion level, and caring for others  PARTICIPATION LIMITATIONS: meal prep, cleaning, laundry, shopping, community activity, occupation, and yard work  PERSONAL FACTORS: Time since onset of injury/illness/exacerbation and 1 comorbidity: chronic R ankle pain  are also affecting patient's functional outcome.   REHAB POTENTIAL: Good  CLINICAL DECISION MAKING: Evolving/moderate complexity  EVALUATION COMPLEXITY: Moderate   GOALS: Goals reviewed with patient? Yes  SHORT TERM GOALS: Target date: 06/14/2022    Patient will be independent with HEP in order to improve functional outcomes. Baseline: Goal status: IN PROGRESS  2.  Patient will report at least 25% improvement in symptoms for improved quality of life. Baseline: Goal status: IN PROGRESS   LONG TERM GOALS: Target date: 07/05/2022    Patient will report at least 75% improvement in symptoms for improved quality of life. Baseline:  Goal status: IN PROGRESS  2.  Patient will improve LEFS score by at least 18 points in order to indicate improved tolerance to activity. Baseline: 7/80 Goal status: IN PROGRESS  3.  Patient will be able to navigate stairs with reciprocal pattern without compensation in order to demonstrate improved LE strength. Baseline:  Goal status: IN PROGRESS  4.  Patient will be able to ambulate at least 226 feet in 2MWT without boot or AD in order to demonstrate improved tolerance to activity. Baseline:  Goal status: IN PROGRESS  5.  Patient will demonstrate grade of at least 4+/5 MMT grade in all tested musculature as evidence of improved strength to assist with stair ambulation and gait.  Baseline: see above Goal status: IN PROGRESS     PLAN:  PT FREQUENCY: 2x/week  PT DURATION: 6  weeks  PLANNED INTERVENTIONS: Therapeutic exercises, Therapeutic activity, Neuromuscular re-education, Balance training, Gait training, Patient/Family education, Joint manipulation, Joint mobilization, Stair training, Orthotic/Fit training, DME instructions, Aquatic Therapy, Dry Needling, Electrical stimulation, Spinal manipulation, Spinal mobilization, Cryotherapy, Moist heat, Compression bandaging, scar mobilization, Splintting, Taping, Traction, Ultrasound, Ionotophoresis 75m/ml Dexamethasone, and Manual therapy  PLAN FOR NEXT SESSION:  Continue with ankle ROM, general LE strength and stability.  Progress to standing exercises as able.  Reduce to LRAD  2:33 PM, 06/25/22 AMearl LatinPT, DPT Physical Therapist [facility]

## 2022-06-27 ENCOUNTER — Ambulatory Visit (HOSPITAL_COMMUNITY): Payer: Medicaid Other | Admitting: Physical Therapy

## 2022-06-27 DIAGNOSIS — R29898 Other symptoms and signs involving the musculoskeletal system: Secondary | ICD-10-CM | POA: Diagnosis not present

## 2022-06-27 DIAGNOSIS — M25571 Pain in right ankle and joints of right foot: Secondary | ICD-10-CM | POA: Diagnosis not present

## 2022-06-27 DIAGNOSIS — R2689 Other abnormalities of gait and mobility: Secondary | ICD-10-CM | POA: Diagnosis not present

## 2022-06-27 DIAGNOSIS — M6281 Muscle weakness (generalized): Secondary | ICD-10-CM | POA: Diagnosis not present

## 2022-06-27 NOTE — Therapy (Signed)
OUTPATIENT PHYSICAL THERAPY TREATMENT   Patient Name: Valerie Huber MRN: [id number] DOB:1985/08/15, 37 y.o., female Today's Date: 06/25/2022  END OF SESSION:   PT End of Session - 06/25/22 1432     Visit Number 5    Number of Visits 13    Date for PT Re-Evaluation 07/05/22    Authorization Type Medicaid healthy blue    Authorization Time Period 13 visits approved from 1/11 to 4/10    Authorization - Visit Number 4    Authorization - Number of Visits 13    Progress Note Due on Visit 10    PT Start Time [id number]    PT Stop Time 1511    PT Time Calculation (min) 38 min    Activity Tolerance Patient tolerated treatment well    Behavior During Therapy [facility] for tasks assessed/performed                  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   Endometriosis    Irregular periods 12/31/2014   Pelvic pain in female 12/31/2014   Second degree uterine prolapse 12/31/2014   Seizures ([place])    per pt last seizure 2013  no meds   Vaginal Pap smear, abnormal    Past Surgical History:  Procedure Laterality Date   ANKLE SURGERY Right 09/14/2021   BIOPSY  07/06/2019   Procedure: BIOPSY;  Surgeon: Daneil Dolin, MD;  [facility];  Service: Endoscopy;;  colon    COLONOSCOPY WITH PROPOFOL N/A 07/06/2019   one 3 mm polyp hyperplastic, segmental biopsies negative   DILITATION & CURRETTAGE/HYSTROSCOPY WITH NOVASURE ABLATION N/A 10/08/2017   Procedure: DILATATION & CURETTAGE/HYSTEROSCOPY WITH MINERVA  ENDOMETRIAL ABLATION (Procedure #2);  Surgeon: [name], MD;  [facility];  Service: Gynecology;  Laterality: N/A;   ESOPHAGOGASTRODUODENOSCOPY N/A 12/10/2014   SLF: hematoemesis due to moderate erosive gastritis, duoedentitis, and duodenal ulcer   FOOT ARTHRODESIS Right 02/13/2022   Procedure: RIGHT FOOT EXCISION OF MEDIAL CALCANEAL HYPERTROPHY, TALUS HYPERTROPHY, SUBTALAR JOINT ARTHRODESIS;   Surgeon: Erle Crocker, MD;  Location: [place];  Service: Orthopedics;  Laterality: Right;   LEEP     POLYPECTOMY  07/06/2019   Procedure: POLYPECTOMY;  Surgeon: Daneil Dolin, MD;  [facility];  Service: Endoscopy;;   surgery for endometriosis  2005   TUBAL LIGATION Bilateral 10/08/2017   Procedure: BILATERAL TUBAL STERILIZATION WITH FALLOPE RINGS APPLICATION (Procedure #1);  Surgeon: [name], MD;  Location[facility];  Service: Gynecology;  Laterality: Bilateral;   VAGINAL HYSTERECTOMY N/A 12/14/2020   Procedure: TOTAL HYSTERECTOMY VAGINAL;  Surgeon: Florian Buff, MD;  Location: [facility];  Service: Gynecology;  Laterality: N/A;   Patient Active Problem List   Diagnosis Date Noted   Vitamin D deficiency 06/08/2022   Encounter for general adult medical examination with abnormal findings 06/07/2022   Refused influenza vaccine 06/07/2022   Acute right ankle pain 01/26/2022   Mass of upper outer quadrant of left breast 12/14/2021   Gastroesophageal reflux disease without esophagitis 09/07/2021   OSA (obstructive sleep apnea) 09/07/2021   History of endometriosis    Seasonal allergic rhinitis due to pollen 09/01/2020   Encounter for examination following treatment at hospital 04/12/2020   Chronic diarrhea 06/30/2019   Rectal bleeding    Complex regional pain syndrome type 1 of right lower extremity 05/20/2017   MDD (major depressive disorder), recurrent episode (  [place]) 11/30/2015   H/O: substance abuse ([place]) 11/30/2015   History of seizures 11/30/2015   Marijuana use 07/05/2015   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/21/2014    PCP: Ihor Dow MD  REFERRING PROVIDER: Erle Crocker, MD  REFERRING DIAG: right subtalar joint fusion  THERAPY DIAG:  Pain in right ankle and joints of right foot  Muscle weakness (generalized)  Other abnormalities of gait and mobility  Other symptoms and signs involving the musculoskeletal  system  Rationale for Evaluation and Treatment: Rehabilitation  ONSET DATE: DOS: 02/13/22  SUBJECTIVE:   SUBJECTIVE STATEMENT: Pt states that her pain is still a little high due to the pain  PERTINENT HISTORY: Chronic R ankle pain, right subtalar joint fusion 5/23 and 10/23, CRPS PAIN:  Are you having pain? Yes: NPRS scale: 8/10 Pain location: ankle Pain description: burning, shooting, stabbing Aggravating factors: walking, standing Relieving factors: none  PRECAUTIONS: None  WEIGHT BEARING RESTRICTIONS: No  FALLS:  Has patient fallen in last 6 months? Yes. Number of falls 9  LIVING ENVIRONMENT: Lives with: lives with their family Lives in: House/apartment Stairs: No Has following equipment at home: Crutches and shower chair  OCCUPATION: unemployed  PLOF: Independent  PATIENT GOALS: to be able to use her ankle again  NEXT MD VISIT: End of January   OBJECTIVE:   DIAGNOSTIC FINDINGS: XR 02/13/22 IMPRESSION: Intraoperative fluoroscopic images of the right ankle demonstrate subtalar fusion. No obvious perihardware fracture or component malpositioning.  PATIENT SURVEYS: LEFS 7/80  COGNITION: Overall cognitive status: Within functional limits for tasks assessed     SENSATION: Light touch: Impaired  and decreased RLE from knee down  EDEMA: slight edema R ankle    PALPATION:  Grossly tender throughout RLE, hypersensitive  LOWER EXTREMITY ROM:  Active ROM Right eval 06/27/2022 Left eval  Hip flexion     Hip extension     Hip abduction     Hip adduction     Hip internal rotation     Hip external rotation     Knee flexion     Knee extension     Ankle dorsiflexion PROM 0* 3  12  Ankle plantarflexion PROM 15 * 40 45  Ankle inversion  10 25  Ankle eversion  12 10   (Blank rows = not tested) * = pain  LOWER EXTREMITY MMT: unable to fully assess R ankle due to pain, no palpable contraction felt despite several attempts on RLE; Uses UE to move RLE  MMT  Right eval Left eval  Hip flexion    Hip extension    Hip abduction    Hip adduction    Hip internal rotation    Hip external rotation    Knee flexion 3+* 5  Knee extension 3-* 5  Ankle dorsiflexion 0 4+  Ankle plantarflexion    Ankle inversion 0 4+  Ankle eversion 0 4+   (Blank rows = not tested)*= pain    FUNCTIONAL TESTS:  Transfer STS: labored, UE use, relies on LLE  GAIT: Distance walked: 100  feet Assistive device utilized:  unilateral axillary crutch Level of assistance: Modified independence Comments: unilateral axillary crutch, boot on R foot, antalgic   TODAY'S TREATMENT:  DATE:  06/27/2022 Long sit: Heat on ankle while performing isometric exercises All motions x 10 Sitting :  Ankle alphabet.  Baps level 2 for dorsi/plantar, in/eversion and circles  all x 10 reps  Sit to stand x 5  06/25/22 Seated Towel crunch 3x  1 minute Seated towel inv/ev 3x 1 minute Seated heel slides for ankle DF 3 x 10 with 5- 10 second holds Seated heel raises 3x 10 Seated TR 3x 10    06/21/22 Seated Towel crunch 2x  1 minute Seated towel inv/ev 2x 1 minute Manual: STM to gastroc, anterior tibialis, AAROM/PROM with holds at end range positions Seated heel slides for ankle DF 1 x 10 with 5- 10 second holds Seated heel raises 2x 10 Seated TR 2 x 10  Standing lateral weight shifts 1 HHA 1x 10 bilateral Standing anterior/posterior weight shift in stepping position 1 x 10 each leg anterior PATIENT EDUCATION:  Education details: Patient educated on exam findings, POC, scope of PT, HEP, and pain science. Person educated: Patient Education method: Explanation, Demonstration, and Handouts Education comprehension: verbalized understanding, returned demonstration, verbal cues required, and tactile cues required  HOME EXERCISE PROGRAM: Evaluation: Massage of RLE  with hands, things of different textures  Sitting: AROM then self prom Inversion/eversion x 10 AROM then self PROM Dorsi/plantar flexion x 10 Towel crunch x 1 minutes  Toe spread x 10  Heel slide to improve dorsiflexion  06/13/22 Standing weight shifts with UE assist, no boot  Access Code: [email or web address] Date: 06/21/2022 - Standing Weight Shift  - 3 x daily - 7 x weekly - 3 sets - 10 reps - Seated Heel Raise  - 3 x daily - 7 x weekly - 3 sets - 10 reps - Seated Toe Raise  - 3 x daily - 7 x weekly - 3 sets - 10 reps - Stride Stance Weight Shift  - 1 x daily - 7 x weekly - 3 sets - 10 reps 06/27/22 Access Code: [email or web address] URL: [email or web address] Date: 06/27/2022 Prepared by: Rayetta Humphrey  Exercises - Long Sitting Isometric Ankle Inversion in Plantar Flexion with Ball at [place]  - 1 x daily - 7 x weekly - 1 sets - 10 reps - 5" hold - Long Sitting Isometric Ankle Plantarflexion with Ball at [place]  - 1 x daily - 7 x weekly - 1 sets - 10 reps - 5" hold - Long Sitting Isometric Ankle Eversion in Dorsiflexion with Ball at [place]  - 1 x daily - 7 x weekly - 1 sets - 10 reps - 5" hold - Isometric Ankle Dorsiflexion and Plantarflexion  - 1 x daily - 7 x weekly - 1 sets - 10 reps - 5" hold - Seated Ankle Alphabet  - 1 x daily - 7 x weekly - 1 sets - 1 reps ASSESSMENT:  CLINICAL IMPRESSION: Patient arrives with bilateral axillary crutches and walking boot. Therapist added isometrics and given as a HEP.  Progressed pt to BAPS.   [name]  Patient will continue to benefit from skilled physical therapy in order to improve function and reduce impairment.  OBJECTIVE IMPAIRMENTS: Abnormal gait, decreased activity tolerance, decreased balance, decreased mobility, difficulty walking, decreased ROM, decreased strength, impaired flexibility, improper body mechanics, and pain.   ACTIVITY LIMITATIONS: carrying, lifting, bending, standing, squatting, stairs, transfers, locomotion level, and caring for  others  PARTICIPATION LIMITATIONS: meal prep, cleaning, laundry, shopping, community activity, occupation, and yard work  PERSONAL FACTORS: Time since onset of injury/illness/exacerbation and 1 comorbidity: chronic R  ankle pain  are also affecting patient's functional outcome.   REHAB POTENTIAL: Good  CLINICAL DECISION MAKING: Evolving/moderate complexity  EVALUATION COMPLEXITY: Moderate   GOALS: Goals reviewed with patient? Yes  SHORT TERM GOALS: Target date: 06/14/2022    Patient will be independent with HEP in order to improve functional outcomes. Baseline: Goal status: IN PROGRESS  2.  Patient will report at least 25% improvement in symptoms for improved quality of life. Baseline: Goal status: IN PROGRESS   LONG TERM GOALS: Target date: 07/05/2022    Patient will report at least 75% improvement in symptoms for improved quality of life. Baseline:  Goal status: IN PROGRESS  2.  Patient will improve LEFS score by at least 18 points in order to indicate improved tolerance to activity. Baseline: 7/80 Goal status: IN PROGRESS  3.  Patient will be able to navigate stairs with reciprocal pattern without compensation in order to demonstrate improved LE strength. Baseline:  Goal status: IN PROGRESS  4.  Patient will be able to ambulate at least 226 feet in 2MWT without boot or AD in order to demonstrate improved tolerance to activity. Baseline:  Goal status: IN PROGRESS  5.  Patient will demonstrate grade of at least 4+/5 MMT grade in all tested musculature as evidence of improved strength to assist with stair ambulation and gait.  Baseline: see above Goal status: IN PROGRESS     PLAN:  PT FREQUENCY: 2x/week  PT DURATION: 6 weeks  PLANNED INTERVENTIONS: Therapeutic exercises, Therapeutic activity, Neuromuscular re-education, Balance training, Gait training, Patient/Family education, Joint manipulation, Joint mobilization, Stair training, Orthotic/Fit training, DME  instructions, Aquatic Therapy, Dry Needling, Electrical stimulation, Spinal manipulation, Spinal mobilization, Cryotherapy, Moist heat, Compression bandaging, scar mobilization, Splintting, Taping, Traction, Ultrasound, Ionotophoresis 33m/ml Dexamethasone, and Manual therapy  PLAN FOR NEXT SESSION:  Continue with ankle ROM, general LE strength and stability.  Progress to standing exercises as able.  Red[facility] to [facility] [id number]

## 2022-06-29 DIAGNOSIS — F4310 Post-traumatic stress disorder, unspecified: Secondary | ICD-10-CM | POA: Diagnosis not present

## 2022-06-29 DIAGNOSIS — F411 Generalized anxiety disorder: Secondary | ICD-10-CM | POA: Diagnosis not present

## 2022-07-02 ENCOUNTER — Encounter (HOSPITAL_COMMUNITY): Payer: Medicaid Other | Admitting: Physical Therapy

## 2022-07-03 DIAGNOSIS — G894 Chronic pain syndrome: Secondary | ICD-10-CM | POA: Diagnosis not present

## 2022-07-03 DIAGNOSIS — M25571 Pain in right ankle and joints of right foot: Secondary | ICD-10-CM | POA: Diagnosis not present

## 2022-07-04 ENCOUNTER — Encounter (HOSPITAL_COMMUNITY): Payer: Medicaid Other | Admitting: Physical Therapy

## 2022-07-09 ENCOUNTER — Encounter (HOSPITAL_COMMUNITY): Payer: Medicaid Other | Admitting: Physical Therapy

## 2022-07-12 ENCOUNTER — Encounter: Payer: Self-pay | Admitting: Radiology

## 2022-07-13 DIAGNOSIS — F4310 Post-traumatic stress disorder, unspecified: Secondary | ICD-10-CM | POA: Diagnosis not present

## 2022-07-13 DIAGNOSIS — F411 Generalized anxiety disorder: Secondary | ICD-10-CM | POA: Diagnosis not present

## 2022-07-19 ENCOUNTER — Emergency Department (HOSPITAL_COMMUNITY): Payer: Medicaid Other

## 2022-07-19 ENCOUNTER — Other Ambulatory Visit: Payer: Self-pay

## 2022-07-19 ENCOUNTER — Emergency Department (HOSPITAL_COMMUNITY)
Admission: EM | Admit: 2022-07-19 | Discharge: 2022-07-19 | Disposition: A | Discharge status: Home / Self Care | Payer: Medicaid Other | Attending: Emergency Medicine | Admitting: Emergency Medicine

## 2022-07-19 DIAGNOSIS — R002 Palpitations: Secondary | ICD-10-CM | POA: Diagnosis not present

## 2022-07-19 DIAGNOSIS — J9811 Atelectasis: Secondary | ICD-10-CM | POA: Diagnosis not present

## 2022-07-19 DIAGNOSIS — F17210 Nicotine dependence, cigarettes, uncomplicated: Secondary | ICD-10-CM | POA: Insufficient documentation

## 2022-07-19 DIAGNOSIS — R0602 Shortness of breath: Secondary | ICD-10-CM | POA: Diagnosis not present

## 2022-07-19 LAB — CBC
HCT: 36.5 % (ref 36.0–46.0)
Hemoglobin: 11.8 g/dL — ABNORMAL LOW (ref 12.0–15.0)
MCH: 30.5 pg (ref 26.0–34.0)
MCHC: 32.3 g/dL (ref 30.0–36.0)
MCV: 94.3 fL (ref 80.0–100.0)
Platelets: 302 10*3/uL (ref 150–400)
RBC: 3.87 MIL/uL (ref 3.87–5.11)
RDW: 13.3 % (ref 11.5–15.5)
WBC: 5.8 10*3/uL (ref 4.0–10.5)
nRBC: 0 % (ref 0.0–0.2)

## 2022-07-19 LAB — BASIC METABOLIC PANEL
Anion gap: 9 (ref 5–15)
BUN: 11 mg/dL (ref 6–20)
CO2: 26 mmol/L (ref 22–32)
Calcium: 8.9 mg/dL (ref 8.9–10.3)
Chloride: 101 mmol/L (ref 98–111)
Creatinine, Ser: 0.72 mg/dL (ref 0.44–1.00)
GFR, Estimated: >60 mL/min (ref >60)
Glucose, Bld: 78 mg/dL (ref 70–99)
Potassium: 3.6 mmol/L (ref 3.5–5.1)
Sodium: 136 mmol/L (ref 135–145)

## 2022-07-19 LAB — TROPONIN I (HIGH SENSITIVITY)
Troponin I (High Sensitivity): <2 ng/L (ref <18)
Troponin I (High Sensitivity): <2 ng/L (ref <18)

## 2022-07-19 LAB — POC URINE PREG, ED: Preg Test, Ur: NEGATIVE

## 2022-07-19 MED ORDER — SODIUM CHLORIDE 0.9 % IV BOLUS: 1000.0000 mL | Freq: Once | INTRAVENOUS | Status: DC

## 2022-07-19 NOTE — ED Provider Notes (Signed)
[facility] Provider Note   CSN: [id number] Arrival date & time: 07/19/22  1319     History Chief Complaint  Patient presents with   Tachycardia    Valerie Huber is a 37 y.o. female who presents to the ED today with heart palpitations that started today.  Patient states she recently started smoking cigarettes again after stopping for 3 to 4 years.  She has also been drinking excessive amount of caffeine.  Patient states that she was at her son's lunch and started having the symptoms.  She did have some shortness of breath as well.  No chest pain.  She has also noticed over the last week or so that she has been having some irregular heartbeat sensations as well.   HPI     Home Medications Prior to Admission medications   Medication Sig Start Date End Date Taking? Authorizing Provider  cetirizine (ZYRTEC) 10 MG tablet Take by mouth. 07/25/21  Yes [provider]  gabapentin (NEURONTIN) 300 MG capsule Take 300 mg by mouth every 8 (eight) hours. 03/27/22  Yes [provider]  Melatonin 1 MG CHEW Chew 1 tablet by mouth at bedtime.   Yes [provider]  methocarbamol (ROBAXIN) 500 MG tablet Take 500 mg by mouth every 8 (eight) hours. 03/27/22  Yes [provider]  morphine (MS CONTIN) 15 MG 12 hr tablet Take 15 mg by mouth every 12 (twelve) hours. 05/01/22  Yes [provider]  Oxycodone HCl 10 MG TABS Take 10 mg by mouth every 6 (six) hours as needed. 05/01/22  Yes [provider]  pantoprazole (PROTONIX) 40 MG tablet TAKE 1 TABLET(40 MG) BY MOUTH DAILY Patient taking differently: Take 40 mg by mouth daily. 05/08/22  Yes Lindell Spar, MD  Vitamin D, Ergocalciferol, (DRISDOL) 1.25 MG (50000 UNIT) CAPS capsule Take 1 capsule (50,000 Units total) by mouth every 7 (seven) days. 06/08/22  Yes Lindell Spar, MD      Allergies    Flexeril [cyclobenzaprine] and Ibuprofen    Review of  Systems   Review of Systems  All other systems reviewed and are negative.   Physical Exam Updated Vital Signs BP (!) 125/96   Pulse 91   Temp 98.6 F (37 C) (Oral)   Resp 20   Ht '5\' 4"'$  (1.626 m)   Wt 77.1 kg   LMP 12/08/2020   SpO2 100%   BMI 29.18 kg/m  Physical Exam Vitals and nursing note reviewed.  Constitutional:      General: She is not in acute distress.    Appearance: Normal appearance.  HENT:     Head: Normocephalic and atraumatic.  Eyes:     General:        Right eye: No discharge.        Left eye: No discharge.  Cardiovascular:     Comments: Regular rate and rhythm.  S1/S2 are distinct without any evidence of murmur, rubs, or gallops.  Radial pulses are 2+ bilaterally.  Dorsalis pedis pulses are 2+ bilaterally.  No evidence of pedal edema. Pulmonary:     Comments: Clear to auscultation bilaterally.  Normal effort.  No respiratory distress.  No evidence of wheezes, rales, or rhonchi heard throughout. Abdominal:     General: Abdomen is flat. Bowel sounds are normal. There is no distension.     Tenderness: There is no abdominal tenderness. There is no guarding or rebound.  Musculoskeletal:  General: Normal range of motion.     Cervical back: Neck supple.  Skin:    General: Skin is warm and dry.     Findings: No rash.  Neurological:     General: No focal deficit present.     Mental Status: She is alert.  Psychiatric:        Mood and Affect: Mood normal.        Behavior: Behavior normal.     ED Results / Procedures / Treatments   Labs (all labs ordered are listed, but only abnormal results are displayed) Labs Reviewed  CBC - Abnormal; Notable for the following components:      Result Value   Hemoglobin 11.8 (*)    All other components within normal limits  BASIC METABOLIC PANEL  POC URINE PREG, ED  TROPONIN I (HIGH SENSITIVITY)  TROPONIN I (HIGH SENSITIVITY)    EKG None  Radiology DG Chest 2 View  Result Date: 07/19/2022 CLINICAL DATA:   Shortness of breath EXAM: CHEST - 2 VIEW COMPARISON:  05/06/2022 FINDINGS: The heart size and mediastinal contours are within normal limits. Both lungs are clear. There is interval clearing of subsegmental atelectasis in right mid lung field. The visualized skeletal structures are unremarkable. IMPRESSION: No active cardiopulmonary disease. Electronically Signed   By: Elmer Picker M.D.   On: 07/19/2022 16:58    Procedures Procedures    Medications Ordered in ED Medications - No data to display   ED Course/ Medical Decision Making/ A&P Clinical Course as of 07/19/22 2021  Thu Jul 19, 2022  2020 CBC(!) Normal. [CF]  2020 Troponin I (High Sensitivity) Initial and delta troponin are normal. [CF]  [id number] Basic metabolic panel Normal. [CF]  2020 POC urine preg, ED Negative. [CF]  2020 DG Chest 2 View I personally ordered and interpreted the study do not see any evidence of pneumonia.  I do agree with the radiologist interpretation. [CF]  2020 EKG 12-Lead I personally interpreted this and did not see any evidence of ST elevation or arrhythmia. [CF]    Clinical Course User Index [CF] Hendricks Limes, PA-C   {    Medical Decision Making Valerie Huber is a 37 y.o. female patient who presents to the emergency department today for further evaluation of palpitations.  I suspect this is likely secondary to the stimulants from nicotine and caffeine.  Patient is now asymptomatic.  Nonetheless, we will plan to get some cardiac enzymes, give her some fluids, chest x-ray, and EKG plan to reassess.  All of her labs and imaging are normal.  All questions or concerns addressed.  Likely related to stimulants from caffeine and nicotine.  I will have her follow-up with her primary care doctor for further evaluation.  Strict return precaution were discussed.  She is safe for discharge.  Amount and/or Complexity of Data Reviewed Labs: ordered. Radiology: ordered. ECG/medicine tests:  ordered.    Final Clinical Impression(s) / ED Diagnoses Final diagnoses:  Palpitations    Rx / DC Orders ED Discharge Orders     None         Cherrie Gauze 07/19/22 2021    Milton Ferguson, MD 07/20/22 1242

## 2022-07-19 NOTE — ED Triage Notes (Signed)
States she was at [employer] & her heart started beating really fast, she couldn't catch her breath, she felt dizzy & eyes blurry.  Doesn't know if it is from "oxycodone 10, morphine '15mg'$ , vitamin D & a muscle relaxer that starts with a M."  States she has pain medications due to her surgeries, she only takes them as needed. Reports taking the oxycodone around 0800 & the morphine around 1045.

## 2022-07-19 NOTE — ED Notes (Signed)
Pt drinking and eating [employer] at this time- pt says she feels better.

## 2022-07-19 NOTE — Discharge Instructions (Addendum)
I suspect that the caffeine and nicotine were a cause of your palpitations today.  All of your labs are normal today.  Your cardiac enzymes were normal.  This specifically tells me if you are having a heart attack which you are not.  Your lungs look great on imaging.  I would advise cutting down on the caffeine and stopping smoking cigarettes.  He may also drink plenty of fluids. You may return to the emergency room at anytime for any worsening symptoms.

## 2022-07-19 NOTE — ED Notes (Signed)
Okay for pt to drink fluids PO instead of IV route. Pt given 16 oz of water.

## 2022-07-19 NOTE — ED Provider Triage Note (Signed)
Emergency Medicine Provider Triage Evaluation Note  Valerie Huber , a 37 y.o. female  was evaluated in triage.  Pt complains of heart palpitations that started today.  Patient states she recently started smoking cigarettes again after stopping for 3 to 4 years.  She also drinking excessive amount of caffeine.  Patient states that she was at her son's lunch and started having the symptoms.  She did have some shortness of breath as well.  No chest pain.  She has also noticed over the last week or so that she has been having some irregular heartbeat sensations as well.  Review of Systems  Positive:  Negative: See above   Physical Exam  BP (!) 127/95   Pulse (!) 107   Temp 98.6 F (37 C) (Oral)   Resp 18   Ht '5\' 4"'$  (1.626 m)   Wt 77.1 kg   LMP 12/08/2020   SpO2 100%   BMI 29.18 kg/m  Gen:   Awake, no distress   Resp:  Normal effort  MSK:   Moves extremities without difficulty  Other:  Regular rate rhythm without any ectopic beats.  Medical Decision Making  Medically screening exam initiated at 4:40 PM.  Appropriate orders placed.  Valerie Huber was informed that the remainder of the evaluation will be completed by another provider, this initial triage assessment does not replace that evaluation, and the importance of remaining in the ED until their evaluation is complete.     Myna Bright [place], [place] 07/19/22 [phone]

## 2022-07-20 DIAGNOSIS — F4310 Post-traumatic stress disorder, unspecified: Secondary | ICD-10-CM | POA: Diagnosis not present

## 2022-07-20 DIAGNOSIS — F411 Generalized anxiety disorder: Secondary | ICD-10-CM | POA: Diagnosis not present

## 2022-07-25 ENCOUNTER — Other Ambulatory Visit: Payer: Self-pay

## 2022-07-25 ENCOUNTER — Ambulatory Visit
Admission: EM | Admit: 2022-07-25 | Discharge: 2022-07-25 | Disposition: A | Discharge status: Home / Self Care | Payer: Medicaid Other | Attending: Family Medicine | Admitting: Family Medicine

## 2022-07-25 DIAGNOSIS — M25819 Other specified joint disorders, unspecified shoulder: Secondary | ICD-10-CM

## 2022-07-25 DIAGNOSIS — R079 Chest pain, unspecified: Secondary | ICD-10-CM | POA: Diagnosis not present

## 2022-07-25 MED ORDER — DEXAMETHASONE SODIUM PHOSPHATE 10 MG/ML IJ SOLN
10.0000 mg | Freq: Once | INTRAMUSCULAR | Status: AC
  Administered 2022-07-25: 10 mg via INTRAMUSCULAR

## 2022-07-25 NOTE — Discharge Instructions (Signed)
We have given you a steroid shot today to help with your chest pain and left arm pain that I suspect to be muscular/nerve related.  Continue taking your gabapentin, muscle relaxers and use a heat and massage to the area to see if this will release things.  Your workup was reassuring today but if your symptoms significantly worsen you need to go to the emergency department for further evaluation.

## 2022-07-25 NOTE — ED Triage Notes (Signed)
Pt reports she is having left side chest pain,shoulder pain that radiates down to her forearm, extremely drained/tired that started this morning and is gradually increasing.

## 2022-07-30 NOTE — ED Provider Notes (Signed)
[facility]    CSN: [id number] Arrival date & time: 07/25/22  1914      History   Chief Complaint Chief Complaint  Patient presents with   Chest Pain    HPI Valerie Huber is a 37 y.o. female.   Presenting today with 1 day history of left lateral chest pain radiating to the shoulder and down into the forearm.  States no known injury or inciting event prior to onset of symptoms.  Denies palpitations, shortness of breath, diaphoresis, nausea, dizziness, headaches, arm numbness or weakness, history of known cardiac issues.  Does have a history of asthma but states this does not feel like a typical asthma flare and she has not needed an inhaler in quite some time.   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   Endometriosis    Irregular periods 12/31/2014   Pelvic pain in female 12/31/2014   Second degree uterine prolapse 12/31/2014   Seizures ([place])    per pt last seizure 2013  no meds   Vaginal Pap smear, abnormal    Patient Active Problem List   Diagnosis Date Noted   Vitamin D deficiency 06/08/2022   Encounter for general adult medical examination with abnormal findings 06/07/2022   Refused influenza vaccine 06/07/2022   Acute right ankle pain 01/26/2022   Mass of upper outer quadrant of left breast 12/14/2021   Gastroesophageal reflux disease without esophagitis 09/07/2021   OSA (obstructive sleep apnea) 09/07/2021   History of endometriosis    Seasonal allergic rhinitis due to pollen 09/01/2020   Encounter for examination following treatment at hospital 04/12/2020   Chronic diarrhea 06/30/2019   Rectal bleeding    Complex regional pain syndrome type 1 of right lower extremity 05/20/2017   MDD (major depressive disorder), recurrent episode ([place]) 11/30/2015   H/O: substance abuse ([place]) 11/30/2015   History of seizures 11/30/2015   Marijuana use 07/05/2015   Severe dysplasia of cervix  (CIN III) 12/21/2014   Past Surgical History:  Procedure Laterality Date   ANKLE SURGERY Right 09/14/2021   BIOPSY  07/06/2019   Procedure: BIOPSY;  Surgeon: Daneil Dolin, MD;  Location: [facility];  Service: Endoscopy;;  colon    COLONOSCOPY WITH PROPOFOL N/A 07/06/2019   one 3 mm polyp hyperplastic, segmental biopsies negative   DILITATION & CURRETTAGE/HYSTROSCOPY WITH NOVASURE ABLATION N/A 10/08/2017   Procedure: DILATATION & CURETTAGE/HYSTEROSCOPY WITH MINERVA  ENDOMETRIAL ABLATION (Procedure #2);  Surgeon: [name], MD;  Location: [facility];  Service: Gynecology;  Laterality: N/A;   ESOPHAGOGASTRODUODENOSCOPY N/A 12/10/2014   SLF: hematoemesis due to moderate erosive gastritis, duoedentitis, and duodenal ulcer   FOOT ARTHRODESIS Right 02/13/2022   Procedure: RIGHT FOOT EXCISION OF MEDIAL CALCANEAL HYPERTROPHY, TALUS HYPERTROPHY, SUBTALAR JOINT ARTHRODESIS;  Surgeon: Erle Crocker, MD;  Location: [facility];  Service: Orthopedics;  Laterality: Right;   LEEP     POLYPECTOMY  07/06/2019   Procedure: POLYPECTOMY;  Surgeon: Daneil Dolin, MD;  Location: [facility];  Service: Endoscopy;;   surgery for endometriosis  2005   TUBAL LIGATION Bilateral 10/08/2017   Procedure: BILATERAL TUBAL STERILIZATION WITH FALLOPE RINGS APPLICATION (Procedure #1);  Surgeon: [name], MD;  Location: [facility];  Service: Gynecology;  Laterality: Bilateral;   VAGINAL HYSTERECTOMY N/A 12/14/2020   Procedure: TOTAL HYSTERECTOMY VAGINAL;  Surgeon: Florian Buff, MD;  Location: [facility];  Service: Gynecology;  Laterality: N/A;  OB History     Gravida  3   Para  2   Term  2   Preterm      AB  1   Living  2      SAB      IAB  1   Ectopic      Multiple  0   Live Births  2           Home Medications    Prior to Admission medications   Medication Sig Start Date End Date Taking? Authorizing Provider  cetirizine (ZYRTEC) 10 MG tablet Take by mouth.  07/25/21   [provider]  gabapentin (NEURONTIN) 300 MG capsule Take 300 mg by mouth every 8 (eight) hours. 03/27/22   [provider]  Melatonin 1 MG CHEW Chew 1 tablet by mouth at bedtime.    [provider]  methocarbamol (ROBAXIN) 500 MG tablet Take 500 mg by mouth every 8 (eight) hours. 03/27/22   [provider]  morphine (MS CONTIN) 15 MG 12 hr tablet Take 15 mg by mouth every 12 (twelve) hours. 05/01/22   [provider]  Oxycodone HCl 10 MG TABS Take 10 mg by mouth every 6 (six) hours as needed. 05/01/22   [provider]  pantoprazole (PROTONIX) 40 MG tablet TAKE 1 TABLET(40 MG) BY MOUTH DAILY Patient taking differently: Take 40 mg by mouth daily. 05/08/22   Lindell Spar, MD  Vitamin D, Ergocalciferol, (DRISDOL) 1.25 MG (50000 UNIT) CAPS capsule Take 1 capsule (50,000 Units total) by mouth every 7 (seven) days. 06/08/22   Lindell Spar, MD    Family History Family History  Problem Relation Age of Onset   Cancer Mother        kidney    Asthma Mother    Hypertension Mother    Colon polyps Sister        68, large polyp, done at 96    Cancer Paternal Grandmother    Asthma Son    Colon cancer Neg Hx     Social History Social History   Tobacco Use   Smoking status: Former    Packs/day: 0.25    Years: 14.00    Additional pack years: 0.00    Total pack years: 3.50    Types: Cigarettes   Smokeless tobacco: Never   Tobacco comments:    quit in 2017   Vaping Use   Vaping Use: Never used  Substance Use Topics   Alcohol use: Not Currently   Drug use: Not Currently    Types: Marijuana    Comment: previous -none in 8 months     Allergies   Flexeril [cyclobenzaprine] and Ibuprofen   Review of Systems Review of Systems PER HPI  Physical Exam Triage Vital Signs ED Triage Vitals  Enc Vitals Group     BP 07/25/22 1920 (!) 125/91     Pulse Rate 07/25/22 1920 (!) 103     Resp 07/25/22 1920 20     Temp  07/25/22 1920 98.2 F (36.8 C)     Temp Source 07/25/22 1920 Oral     SpO2 07/25/22 1920 100 %     Weight --      Height --      Head Circumference --      Peak Flow --      Pain Score 07/25/22 1922 8     Pain Loc --      Pain Edu? --  Excl. in GC? --    No data found.  Updated Vital Signs BP (!) 125/91 (BP Location: Right Arm)   Pulse (!) 103   Temp 98.2 F (36.8 C) (Oral)   Resp 20   LMP 12/08/2020   SpO2 100%   Visual Acuity Right Eye Distance:   Left Eye Distance:   Bilateral Distance:    Right Eye Near:   Left Eye Near:    Bilateral Near:     Physical Exam Vitals and nursing note reviewed.  Constitutional:      Appearance: Normal appearance. She is not ill-appearing.  HENT:     Head: Atraumatic.     Mouth/Throat:     Mouth: Mucous membranes are moist.     Pharynx: Oropharynx is clear.  Eyes:     Extraocular Movements: Extraocular movements intact.     Conjunctiva/sclera: Conjunctivae normal.     Pupils: Pupils are equal, round, and reactive to light.  Cardiovascular:     Rate and Rhythm: Normal rate and regular rhythm.     Heart sounds: Normal heart sounds.  Pulmonary:     Effort: Pulmonary effort is normal.     Breath sounds: Normal breath sounds. No wheezing or rales.  Musculoskeletal:        General: Tenderness present. Normal range of motion.     Cervical back: Normal range of motion and neck supple.     Comments: Mild tenderness to palpation left lateral pectoral muscles into left shoulder.  Grip strength full and equal bilateral hands.  Range of motion intact  Skin:    General: Skin is warm and dry.  Neurological:     Mental Status: She is alert and oriented to person, place, and time.     Motor: No weakness.     Gait: Gait normal.  Psychiatric:        Mood and Affect: Mood normal.        Thought Content: Thought content normal.        Judgment: Judgment normal.    UC Treatments / Results  Labs (all labs ordered are listed, but only  abnormal results are displayed) Labs Reviewed - No data to display  EKG   Radiology No results found.  Procedures Procedures (including critical care time)  Medications Ordered in UC Medications  dexamethasone (DECADRON) injection 10 mg (10 mg Intramuscular Given 07/25/22 2015)    Initial Impression / Assessment and Plan / UC Course  I have reviewed the triage vital signs and the nursing notes.  Pertinent labs & imaging results that were available during my care of the patient were reviewed by me and considered in my medical decision making (see chart for details).     Patient's vital signs are overall reassuring, mildly tachycardic to 103 bpm but otherwise within normal limits.  She is very well-appearing and with no acute distress today.  Her EKG shows normal sinus rhythm at 92 bpm with no new ST or T wave changes.  Her symptoms and exam findings are most consistent with a musculoskeletal complaint, we have given IM Decadron today in clinic and discussed muscle relaxers, heat, massage, stretches.  Return for any worsening symptoms.  Final Clinical Impressions(s) / UC Diagnoses   Final diagnoses:  Chest pain, unspecified type  Shoulder impingement     Discharge Instructions      We have given you a steroid shot today to help with your chest pain and left arm pain that I suspect to be muscular/nerve  related.  Continue taking your gabapentin, muscle relaxers and use a heat and massage to the area to see if this will release things.  Your workup was reassuring today but if your symptoms significantly worsen you need to go to the emergency department for further evaluation.    ED Prescriptions   None    PDMP not reviewed this encounter.   Merrie Roof [place], [place] 07/30/22 [phone]

## 2022-08-01 ENCOUNTER — Ambulatory Visit (HOSPITAL_COMMUNITY): Payer: Medicaid Other | Admitting: Physical Therapy

## 2022-08-01 DIAGNOSIS — M25571 Pain in right ankle and joints of right foot: Secondary | ICD-10-CM | POA: Diagnosis not present

## 2022-08-01 DIAGNOSIS — Z79891 Long term (current) use of opiate analgesic: Secondary | ICD-10-CM | POA: Diagnosis not present

## 2022-08-01 DIAGNOSIS — G894 Chronic pain syndrome: Secondary | ICD-10-CM | POA: Diagnosis not present

## 2022-08-01 DIAGNOSIS — Z79899 Other long term (current) drug therapy: Secondary | ICD-10-CM | POA: Diagnosis not present

## 2022-08-03 DIAGNOSIS — F411 Generalized anxiety disorder: Secondary | ICD-10-CM | POA: Diagnosis not present

## 2022-08-03 DIAGNOSIS — F4310 Post-traumatic stress disorder, unspecified: Secondary | ICD-10-CM | POA: Diagnosis not present

## 2022-08-07 ENCOUNTER — Other Ambulatory Visit: Payer: Self-pay | Admitting: Internal Medicine

## 2022-08-07 ENCOUNTER — Telehealth: Payer: Self-pay | Admitting: Internal Medicine

## 2022-08-07 DIAGNOSIS — J301 Allergic rhinitis due to pollen: Secondary | ICD-10-CM

## 2022-08-07 MED ORDER — LEVOCETIRIZINE DIHYDROCHLORIDE 5 MG PO TABS: 5.0000 mg | ORAL_TABLET | Freq: Every evening | ORAL | 5 refills | Status: DC

## 2022-08-07 NOTE — Telephone Encounter (Signed)
Pt called stating she is wanting to know if she can please get a rx for allergy medication. States it has been a little while since she was on it & does not remember the name of the medication.

## 2022-08-08 NOTE — Telephone Encounter (Signed)
Patient advised.

## 2022-08-17 DIAGNOSIS — F411 Generalized anxiety disorder: Secondary | ICD-10-CM | POA: Diagnosis not present

## 2022-08-17 DIAGNOSIS — F4310 Post-traumatic stress disorder, unspecified: Secondary | ICD-10-CM | POA: Diagnosis not present

## 2022-08-20 ENCOUNTER — Ambulatory Visit
Admission: EM | Admit: 2022-08-20 | Discharge: 2022-08-20 | Disposition: A | Discharge status: Home / Self Care | Payer: Medicaid Other | Attending: Internal Medicine | Admitting: Internal Medicine

## 2022-08-20 ENCOUNTER — Ambulatory Visit (INDEPENDENT_AMBULATORY_CARE_PROVIDER_SITE_OTHER): Payer: Medicaid Other

## 2022-08-20 DIAGNOSIS — M7989 Other specified soft tissue disorders: Secondary | ICD-10-CM | POA: Diagnosis not present

## 2022-08-20 DIAGNOSIS — G8929 Other chronic pain: Secondary | ICD-10-CM | POA: Diagnosis not present

## 2022-08-20 DIAGNOSIS — M25571 Pain in right ankle and joints of right foot: Secondary | ICD-10-CM | POA: Diagnosis not present

## 2022-08-20 NOTE — ED Provider Notes (Signed)
[facility]    CSN: [id number] Arrival date & time: 08/20/22  1227      History   Chief Complaint Chief Complaint  Patient presents with   Ankle Pain    HPI Raynaldo OpitzCarolyn Weida is a 37 y.o. female who presents with chronic R posterior ankle pain and her pain doctor advised her to have this ankle xrayed to make sure the screws are still in placed.     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   Endometriosis    Irregular periods 12/31/2014   Pelvic pain in female 12/31/2014   Second degree uterine prolapse 12/31/2014   Seizures    per pt last seizure 2013  no meds   Vaginal Pap smear, abnormal     Patient Active Problem List   Diagnosis Date Noted   Vitamin D deficiency 06/08/2022   Encounter for general adult medical examination with abnormal findings 06/07/2022   Refused influenza vaccine 06/07/2022   Acute right ankle pain 01/26/2022   Mass of upper outer quadrant of left breast 12/14/2021   Gastroesophageal reflux disease without esophagitis 09/07/2021   OSA (obstructive sleep apnea) 09/07/2021   History of endometriosis    Seasonal allergic rhinitis due to pollen 09/01/2020   Encounter for examination following treatment at hospital 04/12/2020   Chronic diarrhea 06/30/2019   Rectal bleeding    Complex regional pain syndrome type 1 of right lower extremity 05/20/2017   MDD (major depressive disorder), recurrent episode 11/30/2015   H/O: substance abuse 11/30/2015   History of seizures 11/30/2015   Marijuana use 07/05/2015   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/21/2014    Past Surgical History:  Procedure Laterality Date   ANKLE SURGERY Right 09/14/2021   BIOPSY  07/06/2019   Procedure: BIOPSY;  Surgeon: Corbin Adeourk, Robert M, MD;  Location: [facility];  Service: Endoscopy;;  colon    COLONOSCOPY WITH PROPOFOL N/A 07/06/2019   one 3 mm polyp hyperplastic, segmental biopsies  negative   DILITATION & CURRETTAGE/HYSTROSCOPY WITH NOVASURE ABLATION N/A 10/08/2017   Procedure: DILATATION & CURETTAGE/HYSTEROSCOPY WITH MINERVA  ENDOMETRIAL ABLATION (Procedure #2);  Surgeon: Tilda BurrowFerguson, John V, MD;  Location: [facility];  Service: Gynecology;  Laterality: N/A;   ESOPHAGOGASTRODUODENOSCOPY N/A 12/10/2014   SLF: hematoemesis due to moderate erosive gastritis, duoedentitis, and duodenal ulcer   FOOT ARTHRODESIS Right 02/13/2022   Procedure: RIGHT FOOT EXCISION OF MEDIAL CALCANEAL HYPERTROPHY, TALUS HYPERTROPHY, SUBTALAR JOINT ARTHRODESIS;  Surgeon: Terance HartAdair, Christopher R, MD;  Location: [facility];  Service: Orthopedics;  Laterality: Right;   LEEP     POLYPECTOMY  07/06/2019   Procedure: POLYPECTOMY;  Surgeon: Corbin Adeourk, Robert M, MD;  Location: [facility];  Service: Endoscopy;;   surgery for endometriosis  2005   TUBAL LIGATION Bilateral 10/08/2017   Procedure: BILATERAL TUBAL STERILIZATION WITH FALLOPE RINGS APPLICATION (Procedure #1);  Surgeon: Tilda BurrowFerguson, John V, MD;  Location: [facility];  Service: Gynecology;  Laterality: Bilateral;   VAGINAL HYSTERECTOMY N/A 12/14/2020   Procedure: TOTAL HYSTERECTOMY VAGINAL;  Surgeon: Lazaro ArmsEure, Luther H, MD;  Location: [facility];  Service: Gynecology;  Laterality: N/A;    OB History     Gravida  3   Para  2   Term  2   Preterm      AB  1   Living  2      SAB      IAB  1  Ectopic      Multiple  0   Live Births  2            Home Medications    Prior to Admission medications   Medication Sig Start Date End Date Taking? Authorizing Provider  gabapentin (NEURONTIN) 300 MG capsule Take 300 mg by mouth every 8 (eight) hours. 03/27/22  Yes [provider]  levocetirizine (XYZAL) 5 MG tablet Take 1 tablet (5 mg total) by mouth every evening. 08/07/22  Yes [name], MD  Melatonin 1 MG CHEW Chew 1 tablet by mouth at bedtime.   Yes [provider]  methocarbamol (ROBAXIN) 500 MG tablet Take 500  mg by mouth every 8 (eight) hours. 03/27/22  Yes [provider]  morphine (MS CONTIN) 15 MG 12 hr tablet Take 15 mg by mouth every 12 (twelve) hours. 05/01/22  Yes [provider]  Oxycodone HCl 10 MG TABS Take 10 mg by mouth every 6 (six) hours as needed. 05/01/22  Yes [provider]  pantoprazole (PROTONIX) 40 MG tablet TAKE 1 TABLET(40 MG) BY MOUTH DAILY Patient taking differently: Take 40 mg by mouth daily. 05/08/22  Yes [name], MD  Vitamin D, Ergocalciferol, (DRISDOL) 1.25 MG (50000 UNIT) CAPS capsule Take 1 capsule (50,000 Units total) by mouth every 7 (seven) days. 06/08/22  Yes [name], MD    Family History Family History  Problem Relation Age of Onset   Cancer Mother        kidney    Asthma Mother    Hypertension Mother    Colon polyps Sister        89, large polyp, done at [facility]    Cancer Paternal Grandmother    Asthma Son    Colon cancer Neg Hx     Social History Social History   Tobacco Use   Smoking status: Former    Packs/day: 0.25    Years: 14.00    Additional pack years: 0.00    Total pack years: 3.50    Types: Cigarettes   Smokeless tobacco: Never   Tobacco comments:    quit in 2017   Vaping Use   Vaping Use: Never used  Substance Use Topics   Alcohol use: Not Currently   Drug use: Not Currently    Types: Marijuana    Comment: previous -none in 8 months     Allergies   Flexeril [cyclobenzaprine] and Ibuprofen   Review of Systems Review of Systems  Musculoskeletal:  Positive for arthralgias and joint swelling.  Skin:  Negative for color change, rash and wound.     Physical Exam Triage Vital Signs ED Triage Vitals [08/20/22 1403]  Enc Vitals Group     BP 117/85     Pulse Rate 79     Resp 18     Temp 98 F (36.7 C)     Temp Source Oral     SpO2 98 %     Weight      Height      Head Circumference      Peak Flow      Pain Score 10     Pain Loc      Pain Edu?      Excl. in GC?    No data  found.  Updated Vital Signs BP 117/85 (BP Location: Right Arm)   Pulse 79   Temp 98 F (36.7 C) (Oral)   Resp 18   LMP 12/08/2020  SpO2 98%   Visual Acuity Right Eye Distance:   Left Eye Distance:   Bilateral Distance:    Right Eye Near:   Left Eye Near:    Bilateral Near:     Physical Exam Vitals and nursing note reviewed.  Constitutional:      General: She is not in acute distress.    Appearance: She is not toxic-appearing.  HENT:     Right Ear: External ear normal.     Left Ear: External ear normal.  Eyes:     General: No scleral icterus.    Conjunctiva/sclera: Conjunctivae normal.  Cardiovascular:     Pulses: Normal pulses.  Musculoskeletal:     Comments: R ANKLE- with well healed scar on lateral area, there is no redness, warmth or ecchymosis. Her achillis tendon is normal and not tender. Has local tenderness on lateral posterior ankle with palpation. ROM is limited.   Skin:    General: Skin is warm and dry.     Findings: No bruising or rash.  Neurological:     Mental Status: She is alert and oriented to person, place, and time.     Comments: Wears a walker boot  Psychiatric:        Mood and Affect: Mood normal.        Behavior: Behavior normal.        Thought Content: Thought content normal.        Judgment: Judgment normal.      UC Treatments / Results  Labs (all labs ordered are listed, but only abnormal results are displayed) Labs Reviewed - No data to display  EKG   Radiology DG Ankle Complete Right  Result Date: 08/20/2022 CLINICAL DATA:  Persistent ankle pain. Ankle surgery in October 2023. EXAM: RIGHT ANKLE - COMPLETE 3+ VIEW COMPARISON:  Radiographs dated February 13, 2022 FINDINGS: No evidence of acute fracture or dislocation. Postsurgical changes with hardware through the subtalar joint with osseous fusion. Osteophytes about the posterior aspect of the tibiotalar joint. Generalized soft tissue swelling about the medial and lateral aspect of  the ankle. IMPRESSION: 1. No acute fracture or dislocation. 2. Generalized soft tissue swelling about the ankle. 3. Postsurgical changes for subtalar joint arthrodesis with intact hardware. Electronically Signed   By: Larose Hires D.O.   On: 08/20/2022 14:50    Procedures Procedures (including critical care time)  Medications Ordered in UC Medications - No data to display  Initial Impression / Assessment and Plan / UC Course  I have reviewed the triage vital signs and the nursing notes.  Pertinent  imaging results that were available during my care of the patient were reviewed by me and considered in my medical decision making (see chart for details).  Chronic R ankle pain with stable hardware.   She was given the report of the xray and she will pick up a disc to take it to her pain doctor since she wants to look at the films.    Final Clinical Impressions(s) / UC Diagnoses   Final diagnoses:  Chronic pain of right ankle     Discharge Instructions      The screws seem to be intact per the xray report.      ED Prescriptions   None    PDMP not reviewed this encounter.   Garey Ham, [place] 08/20/22 1503

## 2022-08-20 NOTE — Discharge Instructions (Addendum)
The screws seem to be intact per the xray report.

## 2022-08-20 NOTE — ED Triage Notes (Signed)
Pt states her pain management doctor told her to get a x-ray on her right ankle due to unable to walk on her ankle and having more difficulty trying to stand, she had surgery and PT but still having pain and difficulty walking.

## 2022-08-28 ENCOUNTER — Ambulatory Visit
Admission: EM | Admit: 2022-08-28 | Discharge: 2022-08-28 | Disposition: A | Discharge status: Home / Self Care | Payer: Medicaid Other | Attending: Family Medicine | Admitting: Family Medicine

## 2022-08-28 ENCOUNTER — Other Ambulatory Visit: Payer: Self-pay

## 2022-08-28 ENCOUNTER — Ambulatory Visit (HOSPITAL_COMMUNITY): Payer: Medicaid Other | Admitting: Physical Therapy

## 2022-08-28 ENCOUNTER — Encounter: Payer: Self-pay | Admitting: Emergency Medicine

## 2022-08-28 DIAGNOSIS — L02214 Cutaneous abscess of groin: Secondary | ICD-10-CM | POA: Diagnosis not present

## 2022-08-28 MED ORDER — DOXYCYCLINE HYCLATE 100 MG PO CAPS: 100.0000 mg | ORAL_CAPSULE | Freq: Two times a day (BID) | ORAL | 0 refills | Status: DC

## 2022-08-28 NOTE — ED Notes (Signed)
Site lanced. MD reported for pt to apply gauze to site and pad to underwear for tonight. Gauze and pads provided.

## 2022-08-28 NOTE — ED Triage Notes (Signed)
Pt reports "growth that is getting bigger by the hour" on my vagina. Pt denies any fevers, discharge.

## 2022-08-29 ENCOUNTER — Telehealth (HOSPITAL_COMMUNITY): Payer: Self-pay | Admitting: Physical Therapy

## 2022-08-29 DIAGNOSIS — G894 Chronic pain syndrome: Secondary | ICD-10-CM | POA: Diagnosis not present

## 2022-08-29 DIAGNOSIS — Z79891 Long term (current) use of opiate analgesic: Secondary | ICD-10-CM | POA: Diagnosis not present

## 2022-08-29 DIAGNOSIS — M25571 Pain in right ankle and joints of right foot: Secondary | ICD-10-CM | POA: Diagnosis not present

## 2022-08-29 NOTE — Telephone Encounter (Signed)
Patient no show for appointment on 08/28/22. Left message for patient making her aware of missed appointment and instructed to call to make follow up as no additional appointments were scheduled. Patient returned call within a few minutes and scheduled appointment for 08/30/22. Instructed to cancel if unable to attend.  9:22 AM, 08/29/22 Wyman Songster PT, DPT Physical Therapist at [facility]

## 2022-08-29 NOTE — ED Provider Notes (Signed)
[facility]   [id number] 08/28/22 Arrival Time: 1922  ASSESSMENT & PLAN:  1. Abscess of groin, left     Incision and Drainage Procedure Note  Anesthesia: 1% plain lidocaine  Procedure Details  The procedure, risks and complications have been discussed in detail (including, but not limited to pain and bleeding) with the patient. RN present in room during procedure.  The skin induration was prepped and draped in the usual fashion. After adequate local anesthesia, I&D with a #11 blade was performed on the left inferior vulva with moderate purulent drainage.  EBL: minimal Drains: none Packing: N/A Condition: Tolerated procedure well Complications: none.  Meds ordered this encounter  Medications   doxycycline (VIBRAMYCIN) 100 MG capsule    Sig: Take 1 capsule (100 mg total) by mouth 2 (two) times daily.    Dispense:  14 capsule    Refill:  0   Wound care instructions discussed and given in written format. To return in 48 hours for wound check.  Finish all antibiotics. OTC analgesics as needed.  Reviewed expectations re: course of current medical issues. Questions answered. Outlined signs and symptoms indicating need for more acute intervention. Patient verbalized understanding. After Visit Summary given.   SUBJECTIVE:  Valerie Huber is a 37 y.o. female who presents with a possible infection of her L vaginal area. Onset abrupt, today without active drainage and with active bleeding. Symptoms have gradually worsened since beginning. Fever: absent. OTC/home treatment: none.   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   Endometriosis    Irregular periods 12/31/2014   Pelvic pain in female 12/31/2014   Second degree uterine prolapse 12/31/2014   Seizures    per pt last seizure 2013  no meds   Vaginal Pap smear, abnormal    Social History   Socioeconomic History   Marital status: Single    Spouse name: Not on file   Number of children: Not on file   Years of education: Not on file   Highest education level: Not on file  Occupational History   Occupation: Housecleaning  Tobacco Use   Smoking status: Former    Packs/day: 0.25    Years: 14.00    Additional pack years: 0.00    Total pack years: 3.50    Types: Cigarettes   Smokeless tobacco: Never   Tobacco comments:    quit in 2017   Vaping Use   Vaping Use: Never used  Substance and Sexual Activity   Alcohol use: Not Currently   Drug use: Not Currently    Types: Marijuana    Comment: previous -none in 8 months   Sexual activity: Not Currently    Birth control/protection: Surgical    Comment: hyst  Other Topics Concern   Not on file  Social History Narrative   Not on file   Social Determinants of Health   Financial Resource Strain: Low Risk  (  09/13/2021)   Overall Financial Resource Strain (CARDIA)    Difficulty of Paying Living Expenses: Not very hard  Food Insecurity: No Food Insecurity (06/23/2021)   Hunger Vital Sign    Worried About Running Out of Food in the Last Year: Never true    Ran Out of Food in the Last Year: Never true  Transportation Needs: No Transportation Needs (06/23/2021)   PRAPARE - Administrator, Civil Service (Medical): No    Lack of Transportation (Non-Medical): No  Physical Activity: Inactive (08/08/2021)   Exercise Vital Sign    Days of Exercise per Week: 0 days    Minutes of Exercise per Session: 0  min  Stress: Stress Concern Present (09/13/2021)   [employer] of Occupational Health - Occupational Stress Questionnaire    Feeling of Stress : Very much  Social Connections: Not on file   Family History  Problem Relation Age of Onset   Cancer Mother        kidney    Asthma Mother    Hypertension Mother    Colon polyps Sister        15, large polyp, done at [facility]    Cancer Paternal Grandmother    Asthma Son    Colon cancer Neg Hx    Past Surgical History:  Procedure Laterality Date   ANKLE SURGERY Right 09/14/2021   BIOPSY  07/06/2019   Procedure: BIOPSY;  Surgeon: Corbin Ade, MD;  [facility];  Service: Endoscopy;;  colon    COLONOSCOPY WITH PROPOFOL N/A 07/06/2019   one 3 mm polyp hyperplastic, segmental biopsies negative   DILITATION & CURRETTAGE/HYSTROSCOPY WITH NOVASURE ABLATION N/A 10/08/2017   Procedure: DILATATION & CURETTAGE/HYSTEROSCOPY WITH MINERVA  ENDOMETRIAL ABLATION (Procedure #2);  Surgeon: Tilda Burrow, MD;  Location: [facility];  Service: Gynecology;  Laterality: N/A;   ESOPHAGOGASTRODUODENOSCOPY N/A 12/10/2014   SLF: hematoemesis due to moderate erosive gastritis, duoedentitis, and duodenal ulcer   FOOT ARTHRODESIS Right 02/13/2022   Procedure: RIGHT FOOT EXCISION OF MEDIAL CALCANEAL HYPERTROPHY, TALUS HYPERTROPHY, SUBTALAR JOINT ARTHRODESIS;  Surgeon: Terance Hart, MD;  Location: [facility];  Service: Orthopedics;  Laterality: Right;   LEEP     POLYPECTOMY  07/06/2019   Procedure: POLYPECTOMY;  Surgeon: Corbin Ade, MD;  [facility];  Service: Endoscopy;;   surgery for endometriosis  2005   TUBAL LIGATION Bilateral 10/08/2017   Procedure: BILATERAL TUBAL STERILIZATION WITH FALLOPE RINGS APPLICATION (Procedure #1);  Surgeon: Tilda Burrow, MD;  Location: [facility];  Service: Gynecology;  Laterality: Bilateral;   VAGINAL HYSTERECTOMY N/A 12/14/2020   Procedure: TOTAL HYSTERECTOMY VAGINAL;  Surgeon:  Lazaro Arms, MD;  Location: [facility];  Service: Gynecology;  Laterality: N/AMardella Layman, MD 08/29/22 [phone]

## 2022-08-30 ENCOUNTER — Encounter (HOSPITAL_COMMUNITY): Payer: Self-pay | Admitting: Physical Therapy

## 2022-08-30 ENCOUNTER — Ambulatory Visit (HOSPITAL_COMMUNITY): Payer: Medicaid Other | Attending: Internal Medicine | Admitting: Physical Therapy

## 2022-08-30 DIAGNOSIS — M25571 Pain in right ankle and joints of right foot: Secondary | ICD-10-CM | POA: Insufficient documentation

## 2022-08-30 DIAGNOSIS — R2689 Other abnormalities of gait and mobility: Secondary | ICD-10-CM | POA: Diagnosis not present

## 2022-08-30 DIAGNOSIS — R29898 Other symptoms and signs involving the musculoskeletal system: Secondary | ICD-10-CM | POA: Insufficient documentation

## 2022-08-30 DIAGNOSIS — M6281 Muscle weakness (generalized): Secondary | ICD-10-CM | POA: Insufficient documentation

## 2022-08-30 NOTE — Therapy (Addendum)
OUTPATIENT PHYSICAL THERAPY TREATMENT   Patient Name: Valerie Huber MRN: [id number] DOB:Oct 06, 1985, 37 y.o., female Today's Date: 08/30/2022  PHYSICAL THERAPY DISCHARGE SUMMARY  Visits from Start of Care: 7  Current functional level related to goals / functional outcomes: See below   Remaining deficits: See below   Education / Equipment: See below   Patient agrees to discharge. Patient goals were partially met. Patient is being discharged due to meeting the stated rehab goals.   Progress Note   Reporting Period 05/24/22 to 08/30/22   See note below for Objective Data and Assessment of Progress/Goals   END OF SESSION:   PT End of Session - 08/30/22 0817     Visit Number 7    Number of Visits 13    Date for PT Re-Evaluation 08/30/22    Authorization Type Medicaid healthy blue    Authorization Time Period 13 visits approved from 1/11 to 4/10    Authorization - Number of Visits 13    Progress Note Due on Visit 10    PT Start Time 0817    PT Stop Time 0855    PT Time Calculation (min) 38 min    Activity Tolerance Patient tolerated treatment well    Behavior During Therapy [facility] for tasks assessed/performed                  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   Endometriosis    Irregular periods 12/31/2014   Pelvic pain in female 12/31/2014   Second degree uterine prolapse 12/31/2014   Seizures    per pt last seizure 2013  no meds   Vaginal Pap smear, abnormal    Past Surgical History:  Procedure Laterality Date   ANKLE SURGERY Right 09/14/2021   BIOPSY  07/06/2019   Procedure: BIOPSY;  Surgeon: Corbin Ade, MD;  [facility]: [facility];  Service: Endoscopy;;  colon    COLONOSCOPY WITH PROPOFOL N/A 07/06/2019   one 3 mm polyp hyperplastic, segmental biopsies negative   DILITATION & CURRETTAGE/HYSTROSCOPY WITH NOVASURE ABLATION N/A 10/08/2017   Procedure: DILATATION &  CURETTAGE/HYSTEROSCOPY WITH MINERVA  ENDOMETRIAL ABLATION (Procedure #2);  Surgeon: Tilda Burrow, MD;  Location: [facility];  Service: Gynecology;  Laterality: N/A;   ESOPHAGOGASTRODUODENOSCOPY N/A 12/10/2014   SLF: hematoemesis due to moderate erosive gastritis, duoedentitis, and duodenal ulcer   FOOT ARTHRODESIS Right 02/13/2022   Procedure: RIGHT FOOT EXCISION OF MEDIAL CALCANEAL HYPERTROPHY, TALUS HYPERTROPHY, SUBTALAR JOINT ARTHRODESIS;  Surgeon: Terance Hart, MD;  Location: [facility];  Service: Orthopedics;  Laterality: Right;   LEEP     POLYPECTOMY  07/06/2019   Procedure: POLYPECTOMY;  Surgeon: Corbin Ade, MD;  Location: [facility];  Service: Endoscopy;;   surgery for endometriosis  2005   TUBAL LIGATION Bilateral 10/08/2017   Procedure: BILATERAL TUBAL STERILIZATION WITH FALLOPE RINGS APPLICATION (Procedure #1);  Surgeon: Tilda Burrow, MD;  Location: [facility];  Service: Gynecology;  Laterality: Bilateral;   VAGINAL HYSTERECTOMY N/A 12/14/2020   Procedure: TOTAL HYSTERECTOMY VAGINAL;  Surgeon: Lazaro Arms, MD;  Location: [facility];  Service: Gynecology;  Laterality: N/A;   Patient Active Problem List   Diagnosis Date Noted   Vitamin D deficiency 06/08/2022   Encounter for general adult medical examination with abnormal findings 06/07/2022   Refused influenza vaccine 06/07/2022   Acute right ankle pain 01/26/2022   Mass of upper outer quadrant of  left breast 12/14/2021   Gastroesophageal reflux disease without esophagitis 09/07/2021   OSA (obstructive sleep apnea) 09/07/2021   History of endometriosis    Seasonal allergic rhinitis due to pollen 09/01/2020   Encounter for examination following treatment at hospital 04/12/2020   Chronic diarrhea 06/30/2019   Rectal bleeding    Complex regional pain syndrome type 1 of right lower extremity 05/20/2017   MDD (major depressive disorder), recurrent episode 11/30/2015   H/O: substance abuse 11/30/2015    History of seizures 11/30/2015   Marijuana use 07/05/2015   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/21/2014    PCP: Trena Platt MD  REFERRING PROVIDER: Terance Hart, MD  REFERRING DIAG: right subtalar joint fusion  THERAPY DIAG:  Pain in right ankle and joints of right foot - Plan: PT plan of care cert/re-cert  Muscle weakness (generalized) - Plan: PT plan of care cert/re-cert  Other abnormalities of gait and mobility - Plan: PT plan of care cert/re-cert  Other symptoms and signs involving the musculoskeletal system - Plan: PT plan of care cert/re-cert  Rationale for Evaluation and Treatment: Rehabilitation  ONSET DATE: DOS: 02/13/22  SUBJECTIVE:   SUBJECTIVE STATEMENT: Pt states ankle keeps giving out. Sometimes she falls. Has been trying to walk more each day and walks for about 20 minutes at a time. Has been doing HEP. Patient states 50% improvement since beginning PT. Still has issues with pain and swelling. Keeps dealing with the pain. Unsure if she needs more PT and wants to figure out why her ankle is giving out. Tried to do a tiny bit of running and is being more active by pushing herself.  PERTINENT HISTORY: Chronic R ankle pain, right subtalar joint fusion 5/23 and 10/23, CRPS PAIN:  Are you having pain? Yes: NPRS scale: 6/10 Pain location: ankle Pain description: burning, shooting, stabbing Aggravating factors: walking, standing Relieving factors: none  PRECAUTIONS: None  WEIGHT BEARING RESTRICTIONS: No  FALLS:  Has patient fallen in last 6 months? Yes. Number of falls 9  LIVING ENVIRONMENT: Lives with: lives with their family Lives in: House/apartment Stairs: No Has following equipment at home: Crutches and shower chair  OCCUPATION: unemployed  PLOF: Independent  PATIENT GOALS: to be able to use her ankle again  NEXT MD VISIT: End of January   OBJECTIVE:   DIAGNOSTIC FINDINGS: XR 02/13/22 IMPRESSION: Intraoperative fluoroscopic images of  the right ankle demonstrate subtalar fusion. No obvious perihardware fracture or component malpositioning.  PATIENT SURVEYS: LEFS 7/80  LEFS 08/30/22 22/80  COGNITION: Overall cognitive status: Within functional limits for tasks assessed     SENSATION: Light touch: Impaired  and decreased RLE from knee down  EDEMA: slight edema R ankle    PALPATION:  Grossly tender throughout RLE, hypersensitive  LOWER EXTREMITY ROM:  Active ROM Right eval 06/27/2022 Left eval Right  08/30/22 Left 08/30/22  Hip flexion       Hip extension       Hip abduction       Hip adduction       Hip internal rotation       Hip external rotation       Knee flexion       Knee extension       Ankle dorsiflexion PROM 0* 3  12 0 10  Ankle plantarflexion PROM 15 * 40 45 38 41  Ankle inversion  10 25 15 28   Ankle eversion  12 10 10 13    (Blank rows = not tested) * =  pain  LOWER EXTREMITY MMT: unable to fully assess R ankle due to pain, no palpable contraction felt despite several attempts on RLE; Uses UE to move RLE  MMT Right eval Left eval Right 08/30/22 Left 08/30/22  Hip flexion      Hip extension      Hip abduction      Hip adduction      Hip internal rotation      Hip external rotation      Knee flexion 3+* 5 5 5   Knee extension 3-* 5 5 5   Ankle dorsiflexion 0 4+ 5 5  Ankle plantarflexion      Ankle inversion 0 4+ 5 5  Ankle eversion 0 4+ 4+ 5   (Blank rows = not tested)*= pain    FUNCTIONAL TESTS:  Transfer STS: labored, UE use, relies on LLE  GAIT: Distance walked: 100  feet Assistive device utilized:  unilateral axillary crutch Level of assistance: Modified independence Comments: unilateral axillary crutch, boot on R foot, antalgic   TODAY'S TREATMENT:                                                                                                                              DATE:  08/30/22 Reassessment SLS: <5 seconds bilateral  : 365 feet with slightly antalgic gait,  decreased R ankle DF Stairs: able to to complete with alternating pattern, slight R LE ER due to R ankle DF limitation Review of HEP and progression  06/27/2022 Long sit: Heat on ankle while performing isometric exercises All motions x 10 Sitting :  Ankle alphabet.  Baps level 2 for dorsi/plantar, in/eversion and circles  all x 10 reps  Sit to stand x 5  06/25/22 Seated Towel crunch 3x  1 minute Seated towel inv/ev 3x 1 minute Seated heel slides for ankle DF 3 x 10 with 5- 10 second holds Seated heel raises 3x 10 Seated TR 3x 10    06/21/22 Seated Towel crunch 2x  1 minute Seated towel inv/ev 2x 1 minute Manual: STM to gastroc, anterior tibialis, AAROM/PROM with holds at end range positions Seated heel slides for ankle DF 1 x 10 with 5- 10 second holds Seated heel raises 2x 10 Seated TR 2 x 10  Standing lateral weight shifts 1 HHA 1x 10 bilateral Standing anterior/posterior weight shift in stepping position 1 x 10 each leg anterior PATIENT EDUCATION:  Education details:08/30/22: reassessment findings, POC, HEP;  EVAL:Patient educated on exam findings, POC, scope of PT, HEP, and pain science. Person educated: Patient Education method: Explanation, Demonstration, and Handouts Education comprehension: verbalized understanding, returned demonstration, verbal cues required, and tactile cues required  HOME EXERCISE PROGRAM: Evaluation: Massage of RLE with hands, things of different textures  Sitting: AROM then self prom Inversion/eversion x 10 AROM then self PROM Dorsi/plantar flexion x 10 Towel crunch x 1 minutes  Toe spread x 10  Heel slide to improve dorsiflexion  06/13/22 Standing weight shifts with  UE assist, no boot  Access Code: [email or web address] Date: 06/21/2022 - Standing Weight Shift  - 3 x daily - 7 x weekly - 3 sets - 10 reps - Seated Heel Raise  - 3 x daily - 7 x weekly - 3 sets - 10 reps - Seated Toe Raise  - 3 x daily - 7 x weekly - 3 sets - 10 reps - Stride Stance  Weight Shift  - 1 x daily - 7 x weekly - 3 sets - 10 reps 06/27/22 Access Code: [email or web address] URL: [email or web address] Date: 06/27/2022 Prepared by: Virgina Organ  Exercises - Long Sitting Isometric Ankle Inversion in Plantar Flexion with Ball at Wall  - 1 x daily - 7 x weekly - 1 sets - 10 reps - 5" hold - Long Sitting Isometric Ankle Plantarflexion with Ball at Wall  - 1 x daily - 7 x weekly - 1 sets - 10 reps - 5" hold - Long Sitting Isometric Ankle Eversion in Dorsiflexion with Ball at Wall  - 1 x daily - 7 x weekly - 1 sets - 10 reps - 5" hold - Isometric Ankle Dorsiflexion and Plantarflexion  - 1 x daily - 7 x weekly - 1 sets - 10 reps - 5" hold - Seated Ankle Alphabet  - 1 x daily - 7 x weekly - 1 sets - 1 reps  Access Code: [email or web address] URL: [email or web address] Date: 08/30/2022 - Gastroc Stretch on Wall (Mirrored)  - 2 x daily - 7 x weekly - 3 reps - 20-30 seconds hold - Heel Raises with Counter Support  - 2 x daily - 7 x weekly - 2-3 sets - 10 reps - Toe Raises with Counter Support  - 1 x daily - 7 x weekly - 2-3 sets - 10 reps - Single Leg Stance (Mirrored)  - 2 x daily - 7 x weekly - 3 reps - 30 second hold - Step Up  - 2 x daily - 7 x weekly - 2 sets - 10 reps - Sit to Stand with Arms Crossed  - 2 x daily - 7 x weekly - 2 sets - 10 reps  ASSESSMENT:  CLINICAL IMPRESSION: Patient returns after 2 months for physical therapy. Patient has met 2/2 short term goals and 3/5 long term goals with ability to complete HEP and improvement in symptoms, strength, ROM, activity tolerance, gait, balance, and functional mobility. Remaining goals not met due to continued deficits in symptoms, balance, activity tolerance and functional mobility. Patient has made good progress toward remaining goals. Patient with continued limited R ankle DF likely leading symptoms with weightbearing. Patient agreeable to transition to HEP and return to PT if needed.  Patient discharged  from physical therapy at this time.   OBJECTIVE IMPAIRMENTS: Abnormal gait, decreased activity tolerance, decreased balance, decreased mobility, difficulty walking, decreased ROM, decreased strength, impaired flexibility, improper body mechanics, and pain.   ACTIVITY LIMITATIONS: carrying, lifting, bending, standing, squatting, stairs, transfers, locomotion level, and caring for others  PARTICIPATION LIMITATIONS: meal prep, cleaning, laundry, shopping, community activity, occupation, and yard work  PERSONAL FACTORS: Time since onset of injury/illness/exacerbation and 1 comorbidity: chronic R ankle pain  are also affecting patient's functional outcome.   REHAB POTENTIAL: Good  CLINICAL DECISION MAKING: Evolving/moderate complexity  EVALUATION COMPLEXITY: Moderate   GOALS: Goals reviewed with patient? Yes  SHORT TERM GOALS: Target date: 06/14/2022    Patient will be independent with HEP in order to improve functional outcomes. Baseline: Goal status: MET  2.  Patient  will report at least 25% improvement in symptoms for improved quality of life. Baseline: Goal status: MET   LONG TERM GOALS: Target date: 07/05/2022    Patient will report at least 75% improvement in symptoms for improved quality of life. Baseline:  08/30/22 50% improvement  Goal status: NOT MET  2.  Patient will improve LEFS score by at least 18 points in order to indicate improved tolerance to activity. Baseline: 7/80 08/30/22: 22/80 Goal status: NOT MET  3.  Patient will be able to navigate stairs with reciprocal pattern without compensation in order to demonstrate improved LE strength. Baseline:  Goal status: MET  4.  Patient will be able to ambulate at least 226 feet in without boot or AD in order to demonstrate improved tolerance to activity. Baseline:  08/30/22: 365 feet Goal status: MET  5.  Patient will demonstrate grade of at least 4+/5 MMT grade in all tested musculature as evidence of improved  strength to assist with stair ambulation and gait.  Baseline: see above Goal status: MET     PLAN:  PT FREQUENCY: 2x/week  PT DURATION: 6 weeks  PLANNED INTERVENTIONS: Therapeutic exercises, Therapeutic activity, Neuromuscular re-education, Balance training, Gait training, Patient/Family education, Joint manipulation, Joint mobilization, Stair training, Orthotic/Fit training, DME instructions, Aquatic Therapy, Dry Needling, Electrical stimulation, Spinal manipulation, Spinal mobilization, Cryotherapy, Moist heat, Compression bandaging, scar mobilization, Splintting, Taping, Traction, Ultrasound, Ionotophoresis /ml Dexamethasone, and Manual therapy  PLAN FOR NEXT SESSION:  n/a   8:58 AM, 08/30/22 Wyman Songster PT, DPT Physical Therapist at [facility]

## 2022-08-31 DIAGNOSIS — F411 Generalized anxiety disorder: Secondary | ICD-10-CM | POA: Diagnosis not present

## 2022-08-31 DIAGNOSIS — F4310 Post-traumatic stress disorder, unspecified: Secondary | ICD-10-CM | POA: Diagnosis not present

## 2022-09-07 DIAGNOSIS — F411 Generalized anxiety disorder: Secondary | ICD-10-CM | POA: Diagnosis not present

## 2022-09-07 DIAGNOSIS — F4310 Post-traumatic stress disorder, unspecified: Secondary | ICD-10-CM | POA: Diagnosis not present

## 2022-09-14 DIAGNOSIS — F411 Generalized anxiety disorder: Secondary | ICD-10-CM | POA: Diagnosis not present

## 2022-09-14 DIAGNOSIS — F4310 Post-traumatic stress disorder, unspecified: Secondary | ICD-10-CM | POA: Diagnosis not present

## 2022-10-16 DIAGNOSIS — G894 Chronic pain syndrome: Secondary | ICD-10-CM | POA: Diagnosis not present

## 2022-10-16 DIAGNOSIS — M25571 Pain in right ankle and joints of right foot: Secondary | ICD-10-CM | POA: Diagnosis not present

## 2022-10-17 ENCOUNTER — Telehealth: Payer: Self-pay | Admitting: Internal Medicine

## 2022-10-17 NOTE — Telephone Encounter (Signed)
Pt called in regards to recent referral to pain clinic in [place]. She stated she does not feel comfortable at this practice, and she requested to be switched to [facility] in [place].

## 2022-10-23 NOTE — Telephone Encounter (Signed)
Patient called still waiting on referral to [facility] in [place]

## 2022-10-31 DIAGNOSIS — G90521 Complex regional pain syndrome I of right lower limb: Secondary | ICD-10-CM | POA: Diagnosis not present

## 2022-10-31 DIAGNOSIS — Z6833 Body mass index (BMI) 33.0-33.9, adult: Secondary | ICD-10-CM | POA: Diagnosis not present

## 2022-10-31 DIAGNOSIS — G894 Chronic pain syndrome: Secondary | ICD-10-CM | POA: Diagnosis not present

## 2022-10-31 DIAGNOSIS — M129 Arthropathy, unspecified: Secondary | ICD-10-CM | POA: Diagnosis not present

## 2022-10-31 DIAGNOSIS — Z79899 Other long term (current) drug therapy: Secondary | ICD-10-CM | POA: Diagnosis not present

## 2022-10-31 DIAGNOSIS — E559 Vitamin D deficiency, unspecified: Secondary | ICD-10-CM | POA: Diagnosis not present

## 2022-10-31 DIAGNOSIS — R030 Elevated blood-pressure reading, without diagnosis of hypertension: Secondary | ICD-10-CM | POA: Diagnosis not present

## 2022-10-31 LAB — LAB REPORT - SCANNED
Creatinine, POC: 181.6 mg/dL
EGFR: 107
EGFR: 130

## 2022-11-19 DIAGNOSIS — F4310 Post-traumatic stress disorder, unspecified: Secondary | ICD-10-CM | POA: Diagnosis not present

## 2022-11-19 DIAGNOSIS — F411 Generalized anxiety disorder: Secondary | ICD-10-CM | POA: Diagnosis not present

## 2022-12-06 ENCOUNTER — Ambulatory Visit: Payer: Medicaid Other | Admitting: Internal Medicine

## 2022-12-11 ENCOUNTER — Encounter: Payer: Self-pay | Admitting: Internal Medicine

## 2022-12-24 DIAGNOSIS — F4310 Post-traumatic stress disorder, unspecified: Secondary | ICD-10-CM | POA: Diagnosis not present

## 2022-12-24 DIAGNOSIS — F411 Generalized anxiety disorder: Secondary | ICD-10-CM | POA: Diagnosis not present

## 2022-12-31 DIAGNOSIS — F4310 Post-traumatic stress disorder, unspecified: Secondary | ICD-10-CM | POA: Diagnosis not present

## 2022-12-31 DIAGNOSIS — F411 Generalized anxiety disorder: Secondary | ICD-10-CM | POA: Diagnosis not present

## 2023-01-06 ENCOUNTER — Other Ambulatory Visit: Payer: Self-pay

## 2023-01-06 ENCOUNTER — Emergency Department (HOSPITAL_COMMUNITY)
Admission: EM | Admit: 2023-01-06 | Discharge: 2023-01-06 | Disposition: A | Discharge status: Home / Self Care | Payer: Medicaid Other | Source: Home / Self Care | Attending: Emergency Medicine | Admitting: Emergency Medicine

## 2023-01-06 ENCOUNTER — Encounter (HOSPITAL_COMMUNITY): Payer: Self-pay | Admitting: Emergency Medicine

## 2023-01-06 ENCOUNTER — Emergency Department (HOSPITAL_COMMUNITY): Payer: Medicaid Other

## 2023-01-06 DIAGNOSIS — S93401A Sprain of unspecified ligament of right ankle, initial encounter: Secondary | ICD-10-CM | POA: Diagnosis not present

## 2023-01-06 DIAGNOSIS — X503XXA Overexertion from repetitive movements, initial encounter: Secondary | ICD-10-CM | POA: Diagnosis not present

## 2023-01-06 DIAGNOSIS — S99911A Unspecified injury of right ankle, initial encounter: Secondary | ICD-10-CM | POA: Diagnosis present

## 2023-01-06 MED ORDER — HYDROCODONE-ACETAMINOPHEN 5-325 MG PO TABS
1.0000 | ORAL_TABLET | Freq: Once | ORAL | Status: AC
  Administered 2023-01-06: 1 via ORAL
  Filled 2023-01-06: qty 1

## 2023-01-06 NOTE — Discharge Instructions (Signed)
Is a pleasure taking care of you today.  Your x-ray showed stable postsurgical changes but no new fractures or other injuries.  You likely have a sprain.  We are putting you in a boot.  Rest, elevate, use Tylenol for pain and follow-up with your orthopedic doctor.  Come back to the ER for new or worsening symptoms.
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
## 2023-01-06 NOTE — ED Triage Notes (Signed)
Pt c/o increased right foot pain since having to run on it yesterday with boot.

## 2023-01-07 DIAGNOSIS — F4310 Post-traumatic stress disorder, unspecified: Secondary | ICD-10-CM | POA: Diagnosis not present

## 2023-01-07 DIAGNOSIS — F411 Generalized anxiety disorder: Secondary | ICD-10-CM | POA: Diagnosis not present

## 2023-01-18 DIAGNOSIS — F411 Generalized anxiety disorder: Secondary | ICD-10-CM | POA: Diagnosis not present

## 2023-01-18 DIAGNOSIS — F4310 Post-traumatic stress disorder, unspecified: Secondary | ICD-10-CM | POA: Diagnosis not present

## 2023-02-08 DIAGNOSIS — F411 Generalized anxiety disorder: Secondary | ICD-10-CM | POA: Diagnosis not present

## 2023-02-08 DIAGNOSIS — F4310 Post-traumatic stress disorder, unspecified: Secondary | ICD-10-CM | POA: Diagnosis not present

## 2023-02-15 DIAGNOSIS — F4310 Post-traumatic stress disorder, unspecified: Secondary | ICD-10-CM | POA: Diagnosis not present

## 2023-02-15 DIAGNOSIS — F411 Generalized anxiety disorder: Secondary | ICD-10-CM | POA: Diagnosis not present

## 2023-03-04 ENCOUNTER — Other Ambulatory Visit: Payer: Self-pay

## 2023-03-04 ENCOUNTER — Emergency Department
Admission: EM | Admit: 2023-03-04 | Discharge: 2023-03-04 | Disposition: A | Discharge status: Home / Self Care | Payer: Medicaid Other | Attending: Emergency Medicine | Admitting: Emergency Medicine

## 2023-03-04 DIAGNOSIS — B349 Viral infection, unspecified: Secondary | ICD-10-CM | POA: Diagnosis not present

## 2023-03-04 DIAGNOSIS — Z1152 Encounter for screening for COVID-19: Secondary | ICD-10-CM | POA: Insufficient documentation

## 2023-03-04 DIAGNOSIS — J069 Acute upper respiratory infection, unspecified: Secondary | ICD-10-CM | POA: Insufficient documentation

## 2023-03-04 DIAGNOSIS — J45909 Unspecified asthma, uncomplicated: Secondary | ICD-10-CM | POA: Diagnosis not present

## 2023-03-04 DIAGNOSIS — J111 Influenza due to unidentified influenza virus with other respiratory manifestations: Secondary | ICD-10-CM

## 2023-03-04 DIAGNOSIS — R509 Fever, unspecified: Secondary | ICD-10-CM | POA: Diagnosis present

## 2023-03-04 DIAGNOSIS — J101 Influenza due to other identified influenza virus with other respiratory manifestations: Secondary | ICD-10-CM | POA: Diagnosis not present

## 2023-03-04 LAB — RESP PANEL BY RT-PCR (RSV, FLU A&B, COVID)  RVPGX2
Influenza A by PCR: NEGATIVE
Influenza B by PCR: NEGATIVE
Resp Syncytial Virus by PCR: NEGATIVE
SARS Coronavirus 2 by RT PCR: NEGATIVE

## 2023-03-04 MED ORDER — GUAIFENESIN-CODEINE 100-10 MG/5ML PO SOLN: 5.0000 mL | Freq: Four times a day (QID) | ORAL | 0 refills | Status: DC | PRN

## 2023-03-04 NOTE — ED Triage Notes (Signed)
Pt came to ER with husband for generalized "bone pain." Pt's voice very horse sounding for the last two days. States she had a fever and N/V yesterday. Son at home has similar symptoms. Pt took Motrin today and Tylenol last night

## 2023-03-04 NOTE — ED Provider Notes (Signed)
   [facility] Main Provider Note    Event Date/Time   First MD Initiated Contact with Patient 03/04/23 1534     (approximate)  History   Chief Complaint: Generalized Body Aches  HPI  Charleene Tillett is a 37 y.o. female with a past medical history of asthma, presents to the emergency department for 2 days of subjective fever cough congestion body aches.  According to the patient and her son is just getting over an upper respiratory infection now for the past 2 days she has been experiencing low-grade/subjective fever in addition to cough congestion, sore throat and bodyaches.  Overall patient appears well, no distress.  Reassuring vital signs.  Physical Exam   Triage Vital Signs: ED Triage Vitals  Encounter Vitals Group     BP 03/04/23 1252 107/74     Systolic BP Percentile --      Diastolic BP Percentile --      Pulse Rate 03/04/23 1252 90     Resp 03/04/23 1252 15     Temp 03/04/23 1252 98.6 F (37 C)     Temp Source 03/04/23 1252 Oral     SpO2 03/04/23 1252 97 %     Weight --      Height --      Head Circumference --      Peak Flow --      Pain Score 03/04/23 1250 8     Pain Loc --      Pain Education --      Exclude from Growth Chart --     Most recent vital signs: Vitals:   03/04/23 1252  BP: 107/74  Pulse: 90  Resp: 15  Temp: 98.6 F (37 C)  SpO2: 97%    General: Awake, no distress.  CV:  Good peripheral perfusion.  Regular rate and rhythm  Resp:  Normal effort.  Equal breath sounds bilaterally.  No wheeze rales or rhonchi Abd:  No distention.  Soft, nontender.  No rebound or guarding. Other:  Mild congestion.  Slight pharyngeal erythema without tonsillar hypertrophy or exudates.   ED Results / Procedures / Treatments   MEDICATIONS ORDERED IN ED: Medications - No data to display   IMPRESSION / MDM / ASSESSMENT AND PLAN / ED COURSE  I reviewed the triage vital signs and the nursing notes.  Patient's presentation is most  consistent with acute illness / injury with system symptoms.  Patient presents emergency department for 2 days of cough congestion subjective fever and bodyaches.  Overall the patient appears well, no distress, reassuring vital signs, reassuring physical exam clear lung sounds.  Symptoms are suggestive of a upper respiratory infection likely viral infection.  Patient's COVID/flu/RSV test is negative.  Given the patient's URI symptoms I suggested a trial of codeine/guaifenesin in addition to Tylenol/ibuprofen rest and fluids.  If the patient's symptoms do not improve or she thinks they are worsening she will return to the emergency department otherwise she will follow-up with her doctor.  FINAL CLINICAL IMPRESSION(S) / ED DIAGNOSES   Viral URI  Note:  This document was prepared using Dragon voice recognition software and may include unintentional dictation errors.   Minna Antis, MD 03/04/23 1544

## 2023-03-04 NOTE — Discharge Instructions (Signed)
As we discussed please use your cough medication as needed for cough/discomfort.  Do not drink alcohol or drive while taking this medication.  Please use Tylenol or ibuprofen every 6 hours as written on the box to help with fever or discomfort.  Please drink plenty fluids and obtain plenty of rest.  As we discussed if your symptoms worsen please return to the emergency department or follow-up with your doctor.

## 2023-03-05 ENCOUNTER — Other Ambulatory Visit: Payer: Self-pay

## 2023-03-05 ENCOUNTER — Ambulatory Visit
Admission: EM | Admit: 2023-03-05 | Discharge: 2023-03-05 | Disposition: A | Discharge status: Home / Self Care | Payer: Medicaid Other | Attending: Nurse Practitioner | Admitting: Nurse Practitioner

## 2023-03-05 ENCOUNTER — Ambulatory Visit: Payer: Medicaid Other

## 2023-03-05 DIAGNOSIS — R059 Cough, unspecified: Secondary | ICD-10-CM | POA: Diagnosis not present

## 2023-03-05 DIAGNOSIS — R079 Chest pain, unspecified: Secondary | ICD-10-CM

## 2023-03-05 DIAGNOSIS — R141 Gas pain: Secondary | ICD-10-CM

## 2023-03-05 MED ORDER — MYLANTA MAXIMUM STRENGTH 400-400-40 MG/5ML PO SUSP: 15.0000 mL | Freq: Four times a day (QID) | ORAL | 0 refills | Status: AC | PRN

## 2023-03-05 MED ORDER — PROMETHAZINE-DM 6.25-15 MG/5ML PO SYRP: 5.0000 mL | ORAL_SOLUTION | Freq: Four times a day (QID) | ORAL | 0 refills | Status: DC | PRN

## 2023-03-05 NOTE — ED Provider Notes (Signed)
[facility]    CSN: [id number] Arrival date & time: 03/05/23  1812      History   Chief Complaint Chief Complaint  Patient presents with   Chest Pain    HPI Valerie Huber is a 37 y.o. female.   The history is provided by the patient.   Patient presents for complaints of pain under the left breast that started approximately 30 minutes ago.  Patient states that the pain is "sharp" and feels like a "stabbing" pain.  Patient reports that she was seen in the emergency department last night at [facility], she was prescribed guaifenesin.  She is concerned that the guaifenesin may be making her have the symptoms.  She states that since she started taking the medication, she has had diarrhea, with gas and bloating, she also states that her tongue feels "tarts.".  Patient denies fever, chills, headache, chest pain, shortness of breath, difficulty breathing, abdominal pain, nausea, or vomiting.  Patient reports that she only has asthma when she has a cold.  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   Endometriosis    Irregular periods 12/31/2014   Pelvic pain in female 12/31/2014   Second degree uterine prolapse 12/31/2014   Seizures (HCC)    per pt last seizure 2013  no meds   Vaginal Pap smear, abnormal     Patient Active Problem List   Diagnosis Date Noted   Vitamin D deficiency 06/08/2022   Encounter for general adult medical examination with abnormal findings 06/07/2022   Refused influenza vaccine 06/07/2022   Acute right ankle pain 01/26/2022   Mass of upper outer quadrant of left breast 12/14/2021   Gastroesophageal reflux disease without esophagitis 09/07/2021   OSA (obstructive sleep apnea) 09/07/2021   History of endometriosis    Seasonal allergic rhinitis due to pollen 09/01/2020   Encounter for examination following treatment at hospital 04/12/2020   Chronic diarrhea 06/30/2019   Rectal  bleeding    Complex regional pain syndrome type 1 of right lower extremity 05/20/2017   MDD (major depressive disorder), recurrent episode (HCC) 11/30/2015   H/O: substance abuse (HCC) 11/30/2015   History of seizures 11/30/2015   Marijuana use 07/05/2015   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/21/2014    Past Surgical History:  Procedure Laterality Date   ANKLE SURGERY Right 09/14/2021   BIOPSY  07/06/2019   Procedure: BIOPSY;  Surgeon: Corbin Ade, MD;  Location: [facility];  Service: Endoscopy;;  colon    COLONOSCOPY WITH PROPOFOL N/A 07/06/2019   one 3 mm polyp hyperplastic, segmental biopsies negative   DILITATION & CURRETTAGE/HYSTROSCOPY WITH NOVASURE ABLATION N/A 10/08/2017   Procedure: DILATATION & CURETTAGE/HYSTEROSCOPY WITH MINERVA  ENDOMETRIAL ABLATION (Procedure #2);  Surgeon: Tilda Burrow, MD;  Location: [facility];  Service: Gynecology;  Laterality: N/A;   ESOPHAGOGASTRODUODENOSCOPY N/A 12/10/2014   SLF: hematoemesis due to moderate erosive gastritis, duoedentitis, and duodenal ulcer   FOOT ARTHRODESIS Right 02/13/2022   Procedure: RIGHT FOOT EXCISION OF MEDIAL CALCANEAL HYPERTROPHY, TALUS HYPERTROPHY, SUBTALAR JOINT ARTHRODESIS;  Surgeon: Terance Hart, MD;  Location: [facility];  Service: Orthopedics;  Laterality: Right;   LEEP     POLYPECTOMY  07/06/2019   Procedure: POLYPECTOMY;  Surgeon: Corbin Ade, MD;  Location: [facility];  Service: Endoscopy;;   surgery for endometriosis  2005   TUBAL LIGATION Bilateral 10/08/2017   Procedure: BILATERAL TUBAL STERILIZATION WITH  FALLOPE RINGS APPLICATION (Procedure #1);  Surgeon: Tilda Burrow, MD;  Location: [facility];  Service: Gynecology;  Laterality: Bilateral;   VAGINAL HYSTERECTOMY N/A 12/14/2020   Procedure: TOTAL HYSTERECTOMY VAGINAL;  Surgeon: Lazaro Arms, MD;  Location: [facility];  Service: Gynecology;  Laterality: N/A;    OB History     Gravida  3   Para  2   Term  2    Preterm      AB  1   Living  2      SAB      IAB  1   Ectopic      Multiple  0   Live Births  2            Home Medications    Prior to Admission medications   Medication Sig Start Date End Date Taking? Authorizing Provider  alum & mag hydroxide-simeth (MYLANTA MAXIMUM STRENGTH) 400-400-40 MG/5ML suspension Take 15 mLs by mouth every 6 (six) hours as needed for up to 10 days for indigestion. 03/05/23 03/15/23 Yes Leath-Warren, Sadie Haber, NP  promethazine-dextromethorphan (PROMETHAZINE-DM) 6.25-15 MG/5ML syrup Take 5 mLs by mouth 4 (four) times daily as needed. 03/05/23  Yes Leath-Warren, Sadie Haber, NP  doxycycline (VIBRAMYCIN) 100 MG capsule Take 1 capsule (100 mg total) by mouth 2 (two) times daily. 08/28/22   Mardella Layman, MD  gabapentin (NEURONTIN) 300 MG capsule Take 300 mg by mouth every 8 (eight) hours. 03/27/22   [provider]  guaiFENesin-codeine 100-10 MG/5ML syrup Take 5 mLs by mouth every 6 (six) hours as needed. 03/04/23   Minna Antis, MD  levocetirizine (XYZAL) 5 MG tablet Take 1 tablet (5 mg total) by mouth every evening. 08/07/22   [name], MD  Melatonin 1 MG CHEW Chew 1 tablet by mouth at bedtime.    [provider]  methocarbamol (ROBAXIN) 500 MG tablet Take 500 mg by mouth every 8 (eight) hours. 03/27/22   [provider]  morphine (MS CONTIN) 15 MG 12 hr tablet Take 15 mg by mouth every 12 (twelve) hours. 05/01/22   [provider]  Oxycodone HCl 10 MG TABS Take 10 mg by mouth every 6 (six) hours as needed. 05/01/22   [provider]  pantoprazole (PROTONIX) 40 MG tablet TAKE 1 TABLET(40 MG) BY MOUTH DAILY Patient taking differently: Take 40 mg by mouth daily. 05/08/22   [name], MD  Vitamin D, Ergocalciferol, (DRISDOL) 1.25 MG (50000 UNIT) CAPS capsule Take 1 capsule (50,000 Units total) by mouth every 7 (seven) days. 06/08/22   [name], MD    Family History Family History   Problem Relation Age of Onset   Cancer Mother        kidney    Asthma Mother    Hypertension Mother    Colon polyps Sister        53, large polyp, done at [facility]    Cancer Paternal Grandmother    Asthma Son    Colon cancer Neg Hx     Social History Social History   Tobacco Use   Smoking status: Former    Current packs/day: 0.25    Average packs/day: 0.3 packs/day for 14.0 years (3.5 ttl pk-yrs)    Types: Cigarettes   Smokeless tobacco: Never   Tobacco comments:    quit in 2017   Vaping Use   Vaping status: Never Used  Substance Use Topics   Alcohol use: Not Currently   Drug use: Not Currently  Types: Marijuana    Comment: previous -none in 8 months     Allergies   Flexeril [cyclobenzaprine] and Ibuprofen   Review of Systems Review of Systems Per HPI  Physical Exam Triage Vital Signs ED Triage Vitals  Encounter Vitals Group     BP 03/05/23 1822 (!) 156/97     Systolic BP Percentile --      Diastolic BP Percentile --      Pulse Rate 03/05/23 1822 (!) 102     Resp 03/05/23 1822 16     Temp 03/05/23 1822 98.6 F (37 C)     Temp Source 03/05/23 1822 Oral     SpO2 03/05/23 1822 98 %     Weight --      Height --      Head Circumference --      Peak Flow --      Pain Score 03/05/23 1823 3     Pain Loc --      Pain Education --      Exclude from Growth Chart --    No data found.  Updated Vital Signs BP (!) 156/97 (BP Location: Right Arm)   Pulse (!) 102   Temp 98.6 F (37 C) (Oral)   Resp 16   LMP 12/08/2020   SpO2 98%   Visual Acuity Right Eye Distance:   Left Eye Distance:   Bilateral Distance:    Right Eye Near:   Left Eye Near:    Bilateral Near:     Physical Exam Vitals and nursing note reviewed. Exam conducted with a chaperone present.  Constitutional:      General: She is not in acute distress.    Appearance: She is well-developed.  HENT:     Head: Normocephalic.     Mouth/Throat:     Mouth: Mucous membranes are moist.      Pharynx: No posterior oropharyngeal erythema.  Eyes:     Pupils: Pupils are equal, round, and reactive to light.  Cardiovascular:     Rate and Rhythm: Regular rhythm.     Pulses: Normal pulses.     Heart sounds: Normal heart sounds.  Pulmonary:     Effort: Pulmonary effort is normal.     Breath sounds: Normal breath sounds.  Chest:     Chest wall: Tenderness (tenderness noted under left breast/left chest wall) present.  Abdominal:     General: Bowel sounds are normal. There is no distension.     Palpations: Abdomen is soft. There is no mass.     Tenderness: There is no abdominal tenderness. There is no right CVA tenderness, left CVA tenderness, guarding or rebound.     Hernia: No hernia is present.  Musculoskeletal:     Cervical back: Normal range of motion.  Skin:    General: Skin is warm and dry.  Neurological:     Mental Status: She is alert and oriented to person, place, and time.  Psychiatric:        Mood and Affect: Mood normal.        Behavior: Behavior normal.      UC Treatments / Results  Labs (all labs ordered are listed, but only abnormal results are displayed) Labs Reviewed - No data to display  EKG: Normal sinus rhythm, no STEMI.  Compared to EKGs performed on 07/25/2022, 07/19/2022, and 05/06/2022.   Radiology No results found.  Procedures Procedures (including critical care time)  Medications Ordered in UC Medications - No data to display  Initial Impression / Assessment and Plan / UC Course  I have reviewed the triage vital signs and the nursing notes.  Pertinent labs & imaging results that were available during my care of the patient were reviewed by me and considered in my medical decision making (see chart for details).  EKG shows normal sinus rhythm, no STEMI.  Chest x-ray is pending, however, there appears to be a possible gas bubble in the area consistent with where patient is experiencing her pain or discomfort.  Patient advised she will be  contacted only if the chest x-ray is abnormal.  Will start patient on simethicone to help with gas.  Promethazine DM was also prescribed for patient's cough as she was instructed to discontinue the codeine/guaifenesin cough syrup.  Supportive care recommendations were provided and discussed with the patient to include increasing fluids, allowing for plenty of rest, and over-the-counter analgesics.  Patient advised to follow-up with her PCP if symptoms fail to improve.  Patient advised to go to the emergency department if she experiences worsening chest pain, shortness of breath, difficulty breathing, or other concerns.  Patient is in agreement with this plan of care and verbalizes understanding.  All questions were answered.  Patient stable for discharge.   Final Clinical Impressions(s) / UC Diagnoses   Final diagnoses:  Gas pain  Chest pain, unspecified type  Cough, unspecified type     Discharge Instructions      The chest x-ray result is pending.  You will be contacted only if the chest x-ray is abnormal.  You will also have access to the results via MyChart. Take medication as prescribed.  Stop the codeine/guaifenesin cough syrup. Increase fluids and allow for plenty of rest. May take over-the-counter Tylenol or ibuprofen as needed for pain, fever, general discomfort. May use normal saline nasal spray throughout the day to help with nasal congestion and runny nose. For the cough, recommend using a humidifier and sleeping elevated while symptoms persist. If symptoms fail to improve, you may follow-up with your primary care physician for further evaluation. Go to the emergency department if you experience worsening chest pain, shortness of breath, difficulty breathing, or other concerns. Follow-up as needed.     ED Prescriptions     Medication Sig Dispense Auth. Provider   alum & mag hydroxide-simeth (MYLANTA MAXIMUM STRENGTH) 400-400-40 MG/5ML suspension Take 15 mLs by mouth every 6  (six) hours as needed for up to 10 days for indigestion. 355 mL Leath-Warren, Sadie Haber, NP   promethazine-dextromethorphan (PROMETHAZINE-DM) 6.25-15 MG/5ML syrup Take 5 mLs by mouth 4 (four) times daily as needed. 118 mL Leath-Warren, Sadie Haber, NP      PDMP not reviewed this encounter.   Abran Cantor, NP 03/05/23 1913

## 2023-03-05 NOTE — Discharge Instructions (Signed)
The chest x-ray result is pending.  You will be contacted only if the chest x-ray is abnormal.  You will also have access to the results via MyChart. Take medication as prescribed.  Stop the codeine/guaifenesin cough syrup. Increase fluids and allow for plenty of rest. May take over-the-counter Tylenol or ibuprofen as needed for pain, fever, general discomfort. May use normal saline nasal spray throughout the day to help with nasal congestion and runny nose. For the cough, recommend using a humidifier and sleeping elevated while symptoms persist. If symptoms fail to improve, you may follow-up with your primary care physician for further evaluation. Go to the emergency department if you experience worsening chest pain, shortness of breath, difficulty breathing, or other concerns. Follow-up as needed.

## 2023-03-05 NOTE — ED Triage Notes (Signed)
Pt states chest pain under her left breast for the past 30 minutes. States she just started taking cough medicine with codeine in it last night.

## 2023-03-07 ENCOUNTER — Telehealth: Payer: Self-pay

## 2023-03-07 NOTE — Telephone Encounter (Signed)
Pt called to ask why she was unable to see her chest X-Ray, stating that she was told on the day of her visit 03/05/2023 that she was told she would be able to they would be available via MyChart. I informed patient that unfortunately the images themselves do not post to MyChart only the results.  She requested that I either email her the x-ray's or print them off spoke with Provider and provider okayed me to print off x-rays I informed patient that x-rays were printed off and she could come in to pick them up. Pt verbalized understanding.

## 2023-03-08 DIAGNOSIS — F411 Generalized anxiety disorder: Secondary | ICD-10-CM | POA: Diagnosis not present

## 2023-03-08 DIAGNOSIS — F4310 Post-traumatic stress disorder, unspecified: Secondary | ICD-10-CM | POA: Diagnosis not present

## 2023-03-29 DIAGNOSIS — F411 Generalized anxiety disorder: Secondary | ICD-10-CM | POA: Diagnosis not present

## 2023-03-29 DIAGNOSIS — F4310 Post-traumatic stress disorder, unspecified: Secondary | ICD-10-CM | POA: Diagnosis not present

## 2023-04-05 DIAGNOSIS — F4310 Post-traumatic stress disorder, unspecified: Secondary | ICD-10-CM | POA: Diagnosis not present

## 2023-04-05 DIAGNOSIS — F411 Generalized anxiety disorder: Secondary | ICD-10-CM | POA: Diagnosis not present

## 2023-04-12 DIAGNOSIS — F411 Generalized anxiety disorder: Secondary | ICD-10-CM | POA: Diagnosis not present

## 2023-04-12 DIAGNOSIS — F4310 Post-traumatic stress disorder, unspecified: Secondary | ICD-10-CM | POA: Diagnosis not present

## 2023-04-15 ENCOUNTER — Other Ambulatory Visit: Payer: Self-pay

## 2023-04-15 ENCOUNTER — Emergency Department (HOSPITAL_COMMUNITY): Payer: Medicaid Other

## 2023-04-15 ENCOUNTER — Ambulatory Visit: Payer: Self-pay | Admitting: Internal Medicine

## 2023-04-15 ENCOUNTER — Encounter (HOSPITAL_COMMUNITY): Payer: Self-pay

## 2023-04-15 ENCOUNTER — Emergency Department (HOSPITAL_COMMUNITY)
Admission: EM | Admit: 2023-04-15 | Discharge: 2023-04-15 | Disposition: A | Discharge status: Home / Self Care | Payer: Medicaid Other | Attending: Student | Admitting: Student

## 2023-04-15 DIAGNOSIS — M79675 Pain in left toe(s): Secondary | ICD-10-CM | POA: Diagnosis not present

## 2023-04-15 DIAGNOSIS — R200 Anesthesia of skin: Secondary | ICD-10-CM | POA: Diagnosis not present

## 2023-04-15 DIAGNOSIS — M47812 Spondylosis without myelopathy or radiculopathy, cervical region: Secondary | ICD-10-CM | POA: Diagnosis not present

## 2023-04-15 DIAGNOSIS — R519 Headache, unspecified: Secondary | ICD-10-CM | POA: Diagnosis not present

## 2023-04-15 DIAGNOSIS — R202 Paresthesia of skin: Secondary | ICD-10-CM | POA: Diagnosis not present

## 2023-04-15 DIAGNOSIS — M542 Cervicalgia: Secondary | ICD-10-CM | POA: Diagnosis not present

## 2023-04-15 DIAGNOSIS — E010 Iodine-deficiency related diffuse (endemic) goiter: Secondary | ICD-10-CM | POA: Diagnosis not present

## 2023-04-15 MED ORDER — LIDOCAINE 5 % EX PTCH
1.0000 | MEDICATED_PATCH | Freq: Once | CUTANEOUS | Status: DC
  Administered 2023-04-15: 1 via TRANSDERMAL
  Filled 2023-04-15: qty 1

## 2023-04-15 NOTE — Telephone Encounter (Signed)
Copied from CRM [id number]. Topic: Clinical - Red Word Triage >> Apr 15, 2023 11:36 AM Dondra Prader A wrote: Red Word that prompted transfer to Nurse Triage: Pt is stating that she is leaving the ER: [facility] and still in a lot pain neck, spine, and left 4th toe. She stated the ER doctor stated to contact PCP as soon as possible.   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 Pt being discharge from the ED at this time.       Answer Assessment - Initial Assessment Questions 1. REASON FOR CALL or QUESTION: "What is your reason for calling today?" or "How can I best help you?" or "What question do you have that I can help answer?"     Patient requesting PCP appt per ED physician  2. CALLER: Document the source of call. (e.g., laboratory, patient).     patient  Protocols used: PCP Call - No Triage-A-AH

## 2023-04-15 NOTE — Discharge Instructions (Addendum)
Evaluation today was overall reassuring.  Recommend you follow-up with your PCP.  Recommend continue taking Tylenol for your toe pain and neck pain.  Recommend applying ice as well to your neck 3-4 times a day for the next 3 days.
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
## 2023-04-16 ENCOUNTER — Encounter (HOSPITAL_COMMUNITY): Payer: Self-pay

## 2023-04-16 ENCOUNTER — Emergency Department (HOSPITAL_COMMUNITY): Payer: Medicaid Other

## 2023-04-16 ENCOUNTER — Other Ambulatory Visit: Payer: Self-pay

## 2023-04-16 ENCOUNTER — Emergency Department (HOSPITAL_COMMUNITY)
Admission: EM | Admit: 2023-04-16 | Discharge: 2023-04-16 | Disposition: A | Discharge status: Home / Self Care | Payer: Medicaid Other | Attending: Emergency Medicine | Admitting: Emergency Medicine

## 2023-04-16 DIAGNOSIS — M79675 Pain in left toe(s): Secondary | ICD-10-CM | POA: Diagnosis not present

## 2023-04-16 DIAGNOSIS — M4716 Other spondylosis with myelopathy, lumbar region: Secondary | ICD-10-CM | POA: Diagnosis not present

## 2023-04-16 DIAGNOSIS — J45909 Unspecified asthma, uncomplicated: Secondary | ICD-10-CM | POA: Diagnosis not present

## 2023-04-16 DIAGNOSIS — M549 Dorsalgia, unspecified: Secondary | ICD-10-CM | POA: Diagnosis not present

## 2023-04-16 DIAGNOSIS — M50023 Cervical disc disorder at C6-C7 level with myelopathy: Secondary | ICD-10-CM | POA: Diagnosis not present

## 2023-04-16 DIAGNOSIS — S299XXA Unspecified injury of thorax, initial encounter: Secondary | ICD-10-CM | POA: Diagnosis not present

## 2023-04-16 DIAGNOSIS — M542 Cervicalgia: Secondary | ICD-10-CM | POA: Insufficient documentation

## 2023-04-16 DIAGNOSIS — E049 Nontoxic goiter, unspecified: Secondary | ICD-10-CM | POA: Diagnosis not present

## 2023-04-16 DIAGNOSIS — S3992XA Unspecified injury of lower back, initial encounter: Secondary | ICD-10-CM | POA: Diagnosis not present

## 2023-04-16 DIAGNOSIS — M545 Low back pain, unspecified: Secondary | ICD-10-CM | POA: Diagnosis not present

## 2023-04-16 LAB — CBC WITH DIFFERENTIAL/PLATELET
Abs Immature Granulocytes: 0.01 10*3/uL (ref 0.00–0.07)
Basophils Absolute: 0 10*3/uL (ref 0.0–0.1)
Basophils Relative: 0 %
Eosinophils Absolute: 0.1 10*3/uL (ref 0.0–0.5)
Eosinophils Relative: 1 %
HCT: 37.6 % (ref 36.0–46.0)
Hemoglobin: 12 g/dL (ref 12.0–15.0)
Immature Granulocytes: 0 %
Lymphocytes Relative: 52 %
Lymphs Abs: 3.3 10*3/uL (ref 0.7–4.0)
MCH: 31.2 pg (ref 26.0–34.0)
MCHC: 31.9 g/dL (ref 30.0–36.0)
MCV: 97.7 fL (ref 80.0–100.0)
Monocytes Absolute: 0.5 10*3/uL (ref 0.1–1.0)
Monocytes Relative: 8 %
Neutro Abs: 2.5 10*3/uL (ref 1.7–7.7)
Neutrophils Relative %: 39 %
Platelets: 260 10*3/uL (ref 150–400)
RBC: 3.85 MIL/uL — ABNORMAL LOW (ref 3.87–5.11)
RDW: 12.5 % (ref 11.5–15.5)
WBC: 6.3 10*3/uL (ref 4.0–10.5)
nRBC: 0 % (ref 0.0–0.2)

## 2023-04-16 LAB — COMPREHENSIVE METABOLIC PANEL
ALT: 15 U/L (ref 0–44)
AST: 15 U/L (ref 15–41)
Albumin: 4 g/dL (ref 3.5–5.0)
Alkaline Phosphatase: 43 U/L (ref 38–126)
Anion gap: 10 (ref 5–15)
BUN: 12 mg/dL (ref 6–20)
CO2: 21 mmol/L — ABNORMAL LOW (ref 22–32)
Calcium: 9.3 mg/dL (ref 8.9–10.3)
Chloride: 107 mmol/L (ref 98–111)
Creatinine, Ser: 0.6 mg/dL (ref 0.44–1.00)
GFR, Estimated: >60 mL/min (ref >60)
Glucose, Bld: 116 mg/dL — ABNORMAL HIGH (ref 70–99)
Potassium: 3.4 mmol/L — ABNORMAL LOW (ref 3.5–5.1)
Sodium: 138 mmol/L (ref 135–145)
Total Bilirubin: 0.6 mg/dL (ref <1.2)
Total Protein: 7.5 g/dL (ref 6.5–8.1)

## 2023-04-16 LAB — MAGNESIUM: Magnesium: 2.1 mg/dL (ref 1.7–2.4)

## 2023-04-16 LAB — CK: Total CK: 148 U/L (ref 38–234)

## 2023-04-16 MED ORDER — GADOBUTROL 1 MMOL/ML IV SOLN
7.0000 mL | Freq: Once | INTRAVENOUS | Status: AC | PRN
  Administered 2023-04-16: 7 mL via INTRAVENOUS

## 2023-04-16 MED ORDER — OXYCODONE-ACETAMINOPHEN 5-325 MG PO TABS
1.0000 | ORAL_TABLET | Freq: Once | ORAL | Status: AC
  Administered 2023-04-16: 1 via ORAL
  Filled 2023-04-16: qty 1

## 2023-04-16 MED ORDER — METHOCARBAMOL 500 MG PO TABS
1000.0000 mg | ORAL_TABLET | Freq: Once | ORAL | Status: AC
  Administered 2023-04-16: 1000 mg via ORAL
  Filled 2023-04-16: qty 2

## 2023-04-16 MED ORDER — HYDROCODONE-ACETAMINOPHEN 5-325 MG PO TABS: 1.0000 | ORAL_TABLET | ORAL | 0 refills | Status: DC | PRN

## 2023-04-16 MED ORDER — HYDROMORPHONE HCL 1 MG/ML IJ SOLN: 1.0000 mg | Freq: Once | INTRAMUSCULAR | Status: DC
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
## 2023-04-16 NOTE — ED Provider Notes (Signed)
  Physical Exam  BP 106/64   Pulse 71   Temp 98.2 F (36.8 C) (Oral)   Resp 18   Ht 5\' 4"  (1.626 m)   Wt 74.4 kg   LMP 12/08/2020   SpO2 99%   BMI 28.15 kg/m   Physical Exam Vitals and nursing note reviewed.  Constitutional:      General: She is not in acute distress.    Appearance: She is well-developed.  HENT:     Head: Normocephalic and atraumatic.  Eyes:     Conjunctiva/sclera: Conjunctivae normal.  Cardiovascular:     Rate and Rhythm: Normal rate and regular rhythm.     Heart sounds: No murmur heard. Pulmonary:     Effort: Pulmonary effort is normal. No respiratory distress.     Breath sounds: Normal breath sounds.  Abdominal:     Palpations: Abdomen is soft.     Tenderness: There is no abdominal tenderness.  Musculoskeletal:        General: No swelling.     Cervical back: Neck supple. Tenderness present.  Skin:    General: Skin is warm and dry.     Capillary Refill: Capillary refill takes less than 2 seconds.  Neurological:     Mental Status: She is alert.  Psychiatric:        Mood and Affect: Mood normal.     Procedures  Procedures  ED Course / MDM    Medical Decision Making Amount and/or Complexity of Data Reviewed Labs: ordered. Radiology: ordered.  Risk Prescription drug management.   Patient received in handoff.  Neck and back pain pending reevaluation after pain medicine.  On my reevaluation, patient's history is concerning.  She stated that she had an episode overnight where she was unable to move her arms and legs unless she stretches her neck out.  She states that last night her movements were not limited by pain and she felt that they were objectively weak.  She does have a history of complex regional pain syndrome and while this certainly may be at play today, I did obtain broad MRI imaging of the CT and L-spine that were was reassuringly negative for acute cord infarct, compressive myelopathy or other life or limb threatening pathologies.   She was placed in a postop shoe for suspected turf toe.  On evaluation, symptoms have improved and she currently does not meet inpatient criteria for admission and thus was discharged with outpatient follow-up.  Given return precautions which she voiced understanding.       Glendora Score, MD 04/16/23 [phone]

## 2023-04-16 NOTE — ED Notes (Signed)
Pt remains in MRI 

## 2023-04-16 NOTE — ED Notes (Signed)
Unsuccessful IV attempt. Charge RN asked to attempt.

## 2023-04-16 NOTE — ED Notes (Signed)
ED Provider at bedside. 

## 2023-04-16 NOTE — ED Notes (Signed)
Patient transported to MRI 

## 2023-04-16 NOTE — Progress Notes (Unsigned)
   Established Patient Office Visit   Subjective  Patient ID: Valerie Huber, female    DOB: [birth year]  Age: 37 y.o. MRN: [id number]  No chief complaint on file.   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  HPI  ROS    Objective:     LMP 12/08/2020  {Vitals History (Optional):23777}  Physical Exam   No results found for any visits on 04/17/23.  The ASCVD Risk score (Arnett DK, et al., 2019) failed to calculate for the following reasons:   The 2019 ASCVD risk score is only valid for ages 25 to 5    Assessment & Plan:  There are no diagnoses linked to this encounter.  No follow-ups on file.   Cruzita Lederer Newman Nip, FNP

## 2023-04-16 NOTE — Patient Instructions (Signed)
        Great to see you today.  I have refilled the medication(s) we provide.    Imaging shows findings of a partial empty sella and tortuous optic nerves. These findings are nonspecific, meaning they are not tied to a single condition, but they can sometimes be associated with idiopathic intracranial hypertension (IIH). IIH is a condition where there is increased pressure in the fluid around the brain without a clear cause. Symptoms may include headaches, vision changes, or ringing in the ears. While these imaging results alone do not confirm IIH, they can support a diagnosis if your symptoms    - Please take medications as prescribed. - Follow up with your primary health provider if any health concerns arises. - If symptoms worsen please contact your primary care provider and/or visit the emergency department.

## 2023-04-16 NOTE — ED Notes (Signed)
Pt verbalized understanding of discharge instructions. Opportunity for questions provided.  

## 2023-04-16 NOTE — ED Notes (Signed)
Pt is back from MRI.

## 2023-04-16 NOTE — ED Provider Notes (Signed)
[place] [facility] AT [facility] Provider Note   CSN: [id number] Arrival date & time: 04/16/23  0456     History  Chief Complaint  Patient presents with   Back Pain    Valerie Huber is a 37 y.o. female.   Back Pain Patient presents for back pain.  Medical history includes asthma, endometriosis, seizures, GERD, OSA.  She was seen in the ED yesterday for neck pain following roughhousing with her siblings the day before.  During previous ED visit, she underwent CT of head and cervical spine in addition to x-ray of left foot.  Studies were negative for acute findings.  Since her recent ED visit, pain is worsened.  It extends from neck all the way to lumbar spine.  She states that it is similar to pain that she had during prior ED visit.  She has had difficulty walking and has been using crutches that she had from prior surgery.  She has been taking Tylenol and gabapentin for her pain without relief.  She denies any recent difficulty urinating.     Home Medications Prior to Admission medications   Medication Sig Start Date End Date Taking? Authorizing Provider  doxycycline (VIBRAMYCIN) 100 MG capsule Take 1 capsule (100 mg total) by mouth 2 (two) times daily. 08/28/22   Mardella Layman, MD  gabapentin (NEURONTIN) 300 MG capsule Take 300 mg by mouth every 8 (eight) hours. 03/27/22   [provider]  guaiFENesin-codeine 100-10 MG/5ML syrup Take 5 mLs by mouth every 6 (six) hours as needed. 03/04/23   Minna Antis, MD  levocetirizine (XYZAL) 5 MG tablet Take 1 tablet (5 mg total) by mouth every evening. 08/07/22   [name], MD  Melatonin 1 MG CHEW Chew 1 tablet by mouth at bedtime.    [provider]  methocarbamol (ROBAXIN) 500 MG tablet Take 500 mg by mouth every 8 (eight) hours. 03/27/22   [provider]  morphine (MS CONTIN) 15 MG 12 hr tablet Take 15 mg by mouth every 12 (twelve) hours. 05/01/22   [provider]   Oxycodone HCl 10 MG TABS Take 10 mg by mouth every 6 (six) hours as needed. 05/01/22   [provider]  pantoprazole (PROTONIX) 40 MG tablet TAKE 1 TABLET(40 MG) BY MOUTH DAILY Patient taking differently: Take 40 mg by mouth daily. 05/08/22   [name], MD  promethazine-dextromethorphan (PROMETHAZINE-DM) 6.25-15 MG/5ML syrup Take 5 mLs by mouth 4 (four) times daily as needed. 03/05/23   Leath-Warren, Sadie Haber, NP  Vitamin D, Ergocalciferol, (DRISDOL) 1.25 MG (50000 UNIT) CAPS capsule Take 1 capsule (50,000 Units total) by mouth every 7 (seven) days. 06/08/22   [name], MD      Allergies    Flexeril [cyclobenzaprine] and Ibuprofen    Review of Systems   Review of Systems  Musculoskeletal:  Positive for arthralgias, back pain and neck pain.  All other systems reviewed and are negative.   Physical Exam Updated Vital Signs BP (!) 146/132   Pulse 80   Temp 98.2 F (36.8 C) (Oral)   Resp 18   Ht 5\' 4"  (1.626 m)   Wt 74.4 kg   LMP 12/08/2020   SpO2 100%   BMI 28.15 kg/m  Physical Exam Vitals and nursing note reviewed.  Constitutional:      General: She is not in acute distress.    Appearance: Normal appearance. She is well-developed. She is not ill-appearing, toxic-appearing or diaphoretic.  HENT:  Head: Normocephalic and atraumatic.     Right Ear: External ear normal.     Left Ear: External ear normal.     Nose: Nose normal.     Mouth/Throat:     Mouth: Mucous membranes are moist.  Eyes:     Extraocular Movements: Extraocular movements intact.     Conjunctiva/sclera: Conjunctivae normal.  Cardiovascular:     Rate and Rhythm: Normal rate and regular rhythm.  Pulmonary:     Effort: Pulmonary effort is normal. No respiratory distress.  Abdominal:     General: There is no distension.     Palpations: Abdomen is soft.     Tenderness: There is no abdominal tenderness.  Musculoskeletal:        General: Tenderness present. No swelling or deformity.      Cervical back: Normal range of motion and neck supple.     Right lower leg: No edema.     Left lower leg: No edema.  Skin:    General: Skin is warm and dry.     Coloration: Skin is not jaundiced or pale.  Neurological:     General: No focal deficit present.     Mental Status: She is alert and oriented to person, place, and time.  Psychiatric:        Mood and Affect: Mood normal.        Behavior: Behavior normal.     ED Results / Procedures / Treatments   Labs (all labs ordered are listed, but only abnormal results are displayed) Labs Reviewed  CBC WITH DIFFERENTIAL/PLATELET - Abnormal; Notable for the following components:      Result Value   RBC 3.85 (*)    All other components within normal limits  COMPREHENSIVE METABOLIC PANEL - Abnormal; Notable for the following components:   Potassium 3.4 (*)    CO2 21 (*)    Glucose, Bld 116 (*)    All other components within normal limits  MAGNESIUM  CK    EKG None  Radiology CT Thoracic Spine Wo Contrast  Result Date: 04/16/2023 CLINICAL DATA:  Back trauma with no prior injury. Back injury on Sunday EXAM: CT THORACIC AND LUMBAR SPINE WITHOUT CONTRAST TECHNIQUE: Multidetector CT imaging of the thoracic and lumbar spine was performed without contrast. Multiplanar CT image reconstructions were also generated. RADIATION DOSE REDUCTION: This exam was performed according to the departmental dose-optimization program which includes automated exposure control, adjustment of the mA and/or kV according to patient size and/or use of iterative reconstruction technique. COMPARISON:  None Available. FINDINGS: CT THORACIC SPINE FINDINGS Alignment: Normal. Vertebrae: No acute fracture or focal pathologic process. Paraspinal and other soft tissues: Goiter. Disc levels: Mild spondylitic spurring ventrally at lower thoracic levels. No visible herniation or impingement. CT LUMBAR SPINE FINDINGS Segmentation: 5 lumbar type vertebrae. Alignment: Normal.  Vertebrae: No acute fracture or focal pathologic process. Paraspinal and other soft tissues: Negative. Disc levels: No herniation or impingement seen. Asymmetric right degenerative facet spurring at L5-S1 which could affect the traversing L5 nerve root. IMPRESSION: 1. No evidence of injury to the thoracic or lumbar spine. 2. Chronic, degenerative facet spurring on the right at L5-S1 which contacts the traversing L5 nerve root. Electronically Signed   By: Tiburcio Pea M.D.   On: 04/16/2023 06:37   CT Lumbar Spine Wo Contrast  Result Date: 04/16/2023 CLINICAL DATA:  Back trauma with no prior injury. Back injury on Sunday EXAM: CT THORACIC AND LUMBAR SPINE WITHOUT CONTRAST TECHNIQUE: Multidetector CT imaging of the  thoracic and lumbar spine was performed without contrast. Multiplanar CT image reconstructions were also generated. RADIATION DOSE REDUCTION: This exam was performed according to the departmental dose-optimization program which includes automated exposure control, adjustment of the mA and/or kV according to patient size and/or use of iterative reconstruction technique. COMPARISON:  None Available. FINDINGS: CT THORACIC SPINE FINDINGS Alignment: Normal. Vertebrae: No acute fracture or focal pathologic process. Paraspinal and other soft tissues: Goiter. Disc levels: Mild spondylitic spurring ventrally at lower thoracic levels. No visible herniation or impingement. CT LUMBAR SPINE FINDINGS Segmentation: 5 lumbar type vertebrae. Alignment: Normal. Vertebrae: No acute fracture or focal pathologic process. Paraspinal and other soft tissues: Negative. Disc levels: No herniation or impingement seen. Asymmetric right degenerative facet spurring at L5-S1 which could affect the traversing L5 nerve root. IMPRESSION: 1. No evidence of injury to the thoracic or lumbar spine. 2. Chronic, degenerative facet spurring on the right at L5-S1 which contacts the traversing L5 nerve root. Electronically Signed   By:  Tiburcio Pea M.D.   On: 04/16/2023 06:37   CT Head Wo Contrast  Result Date: 04/15/2023 CLINICAL DATA:  Pain after rough housing with her siblings yesterday EXAM: CT HEAD WITHOUT CONTRAST CT CERVICAL SPINE WITHOUT CONTRAST TECHNIQUE: Multidetector CT imaging of the head and cervical spine was performed following the standard protocol without intravenous contrast. Multiplanar CT image reconstructions of the cervical spine were also generated. RADIATION DOSE REDUCTION: This exam was performed according to the departmental dose-optimization program which includes automated exposure control, adjustment of the mA and/or kV according to patient size and/or use of iterative reconstruction technique. COMPARISON:  11/26/2017 CT head, 06/03/2020 CT cervical spine FINDINGS: CT HEAD FINDINGS Brain: No evidence of acute infarct, hemorrhage, mass, mass effect, or midline shift. No hydrocephalus or extra-axial fluid collection. Partial empty sella. Normal craniocervical junction. Vascular: No hyperdense vessel. Skull: Negative for fracture or focal lesion. Sinuses/Orbits: Minimal mucosal thickening in the ethmoid air cells. Somewhat tortuous optic nerves. No acute finding in the orbits. Other: The mastoid air cells are well aerated. CT CERVICAL SPINE FINDINGS Alignment: No traumatic listhesis. Straightening and mild reversal of the normal cervical lordosis, which may be positional. Skull base and vertebrae: No acute fracture or suspicious osseous lesion. Soft tissues and spinal canal: No prevertebral fluid or swelling. No visible canal hematoma. 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 Disc levels: Mild degenerative changes in the cervical spine.No high-grade spinal canal stenosis. Upper chest: No focal pulmonary opacity or pleural effusion. IMPRESSION: 1. No acute intracranial process. 2. No acute fracture or traumatic listhesis in the cervical spine. 3. Thyromegaly, with extension of the thyroid  into the mediastinum. 4. 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 Electronically Signed   By: Wiliam Ke M.D.   On: 04/15/2023 11:08   CT Cervical Spine Wo Contrast  Result Date: 04/15/2023 CLINICAL DATA:  Pain after rough housing with her siblings yesterday EXAM: CT HEAD WITHOUT CONTRAST CT CERVICAL SPINE WITHOUT CONTRAST TECHNIQUE: Multidetector CT imaging of the head and cervical spine was performed following the standard protocol without intravenous contrast. Multiplanar CT image reconstructions of the cervical spine were also generated. RADIATION DOSE REDUCTION: This exam was performed according to the departmental dose-optimization program which includes automated exposure control, adjustment of the mA and/or kV according to patient size and/or use of iterative reconstruction technique. COMPARISON:  11/26/2017 CT head, 06/03/2020 CT cervical spine FINDINGS: CT HEAD  FINDINGS Brain: No evidence of acute infarct, hemorrhage, mass, mass effect, or midline shift. No hydrocephalus or extra-axial fluid collection. Partial empty sella. Normal craniocervical junction. Vascular: No hyperdense vessel. Skull: Negative for fracture or focal lesion. Sinuses/Orbits: Minimal mucosal thickening in the ethmoid air cells. Somewhat tortuous optic nerves. No acute finding in the orbits. Other: The mastoid air cells are well aerated. CT CERVICAL SPINE FINDINGS Alignment: No traumatic listhesis. Straightening and mild reversal of the normal cervical lordosis, which may be positional. Skull base and vertebrae: No acute fracture or suspicious osseous lesion. Soft tissues and spinal canal: No prevertebral fluid or swelling. No visible canal hematoma. 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 Disc levels: Mild degenerative changes in the cervical spine.No high-grade spinal canal stenosis. Upper chest: No  focal pulmonary opacity or pleural effusion. IMPRESSION: 1. No acute intracranial process. 2. No acute fracture or traumatic listhesis in the cervical spine. 3. 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 4. 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 Electronically Signed   By: Wiliam Ke M.D.   On: 04/15/2023 11:08   DG Foot Complete Left  Result Date: 04/15/2023 CLINICAL DATA:  Fourth toe pain and injury. EXAM: LEFT FOOT - COMPLETE 3+ VIEW COMPARISON:  None Available. FINDINGS: Normal alignment in left foot. Negative for fracture or dislocation. There may be mild soft tissue swelling throughout the foot. IMPRESSION: No acute bone abnormality to the left foot. Electronically Signed   By: Richarda Overlie M.D.   On: 04/15/2023 08:50    Procedures Procedures    Medications Ordered in ED Medications  oxyCODONE-acetaminophen (PERCOCET/ROXICET) 5-325 MG per tablet 1 tablet (1 tablet Oral Given 04/16/23 0610)  methocarbamol (ROBAXIN) tablet 1,000 mg (1,000 mg Oral Given 04/16/23 0610)  oxyCODONE-acetaminophen (PERCOCET/ROXICET) 5-325 MG per tablet 1 tablet (1 tablet Oral Given 04/16/23 0709)    ED Course/ Medical Decision Making/ A&P                                 Medical Decision Making Amount and/or Complexity of Data Reviewed Labs: ordered. Radiology: ordered.  Risk Prescription drug management.   This patient presents to the ED for concern of neck pain, this involves an extensive number of treatment options, and is a complaint that carries with it a high risk of complications and morbidity.  The differential diagnosis includes acute injury, radiculopathy, complex pain syndrome   Co morbidities that complicate the patient evaluation  asthma, endometriosis, seizures, GERD, OSA, complex regional pain syndrome   Additional history obtained:  Additional history obtained  from N/A External records from outside source obtained and reviewed including EMR   Lab Tests:  I Ordered, and personally interpreted labs.  The pertinent results include: Normal kidney function, mild hypokalemia with otherwise normal electrolytes, normal hemoglobin, no leukocytosis, normal CK   Imaging Studies ordered:  I ordered imaging studies including CT of thoracic and lumbar spine I independently visualized and interpreted imaging which showed chronic degenerative change without acute findings I agree with the radiologist interpretation   Cardiac Monitoring: / EKG:  The patient was maintained on a cardiac monitor.  I personally viewed and interpreted the cardiac monitored which showed an underlying rhythm of: Sinus rhythm  Problem List / ED Course / Critical interventions / Medication management  Patient presents for worsened pain in area  of neck, thoracic, and lumbar spine.  This reportedly started 2 days ago after she was wrestling with her siblings.  She was seen in the ED yesterday morning.  She underwent CT of head and cervical spine at the time.  She states that spit symptoms have worsened since then.  On arrival in the ED, vital signs are notable for moderate hypertension.  On exam, patient is laying in bed in right lateral decubitus position, tearful.  When pressing down spine, she does endorse tenderness throughout her spine.  She has paraspinous tenderness as well.  Pain seems out of proportion.  Pain medications were ordered in the ED today.  Will obtain lab work further imaging studies.  Per chart review, she does have history of complex regional pain syndrome.  She was previously seeing Dr. Lajoyce Corners about this.  Although she was obtaining high-dose prescriptions for narcotics, does not appear that she has filled any of these in the past 8 months.  On reassessment, patient's pain improved to 6/10 severity.  She is no longer tearful.  She remains in right lateral decubitus  position.  Additional Percocet was ordered.  CT imaging did not show any acute findings.  She does have chronic degenerative change present.  Lab work is reassuring.  Care of patient was on as an oncoming ED provider. I ordered medication including Percocet and Robaxin for analgesia Reevaluation of the patient after these medicines showed that the patient improved I have reviewed the patients home medicines and have made adjustments as needed   Social Determinants of Health:  Has PCP         Final Clinical Impression(s) / ED Diagnoses Final diagnoses:  Acute back pain, unspecified back location, unspecified back pain laterality    Rx / DC Orders ED Discharge Orders     None         Gloris Manchester, MD 04/16/23 [phone]

## 2023-04-17 ENCOUNTER — Encounter: Payer: Self-pay | Admitting: Family Medicine

## 2023-04-17 ENCOUNTER — Ambulatory Visit: Payer: Medicaid Other | Admitting: Family Medicine

## 2023-04-17 VITALS — BP 133/91 | HR 103 | Ht 64.0 in | Wt 173.1 lb

## 2023-04-17 DIAGNOSIS — E782 Mixed hyperlipidemia: Secondary | ICD-10-CM | POA: Diagnosis not present

## 2023-04-17 DIAGNOSIS — G43909 Migraine, unspecified, not intractable, without status migrainosus: Secondary | ICD-10-CM | POA: Insufficient documentation

## 2023-04-17 DIAGNOSIS — J301 Allergic rhinitis due to pollen: Secondary | ICD-10-CM | POA: Diagnosis not present

## 2023-04-17 DIAGNOSIS — R7303 Prediabetes: Secondary | ICD-10-CM | POA: Diagnosis not present

## 2023-04-17 DIAGNOSIS — G43709 Chronic migraine without aura, not intractable, without status migrainosus: Secondary | ICD-10-CM

## 2023-04-17 DIAGNOSIS — E559 Vitamin D deficiency, unspecified: Secondary | ICD-10-CM

## 2023-04-17 DIAGNOSIS — D509 Iron deficiency anemia, unspecified: Secondary | ICD-10-CM | POA: Diagnosis not present

## 2023-04-17 DIAGNOSIS — R030 Elevated blood-pressure reading, without diagnosis of hypertension: Secondary | ICD-10-CM | POA: Diagnosis not present

## 2023-04-17 MED ORDER — VITAMIN D (ERGOCALCIFEROL) 1.25 MG (50000 UNIT) PO CAPS: 50000.0000 [IU] | ORAL_CAPSULE | ORAL | 1 refills | Status: AC

## 2023-04-17 MED ORDER — PANTOPRAZOLE SODIUM 40 MG PO TBEC: 40.0000 mg | DELAYED_RELEASE_TABLET | Freq: Every day | ORAL | 2 refills | Status: AC

## 2023-04-17 MED ORDER — BLOOD PRESSURE KIT DEVI: 1 refills | Status: AC

## 2023-04-17 MED ORDER — TOPIRAMATE 25 MG PO TABS: 25.0000 mg | ORAL_TABLET | Freq: Every day | ORAL | 2 refills | Status: AC

## 2023-04-17 MED ORDER — PROPRANOLOL HCL 10 MG PO TABS: 10.0000 mg | ORAL_TABLET | Freq: Two times a day (BID) | ORAL | 2 refills | Status: AC | PRN

## 2023-04-17 MED ORDER — LEVOCETIRIZINE DIHYDROCHLORIDE 5 MG PO TABS: 5.0000 mg | ORAL_TABLET | Freq: Every evening | ORAL | 5 refills | Status: AC

## 2023-04-17 NOTE — Assessment & Plan Note (Addendum)
Initial visit  Head CT showed: Partial empty sella and tortuous optic nerves, which are nonspecific but can be seen in the setting of idiopathic intracranial hypertension.  Follow up in 2 weeks with at home blood pressure to monitor trends. Patient currently not on any blood pressure medications  Discussed with  patient to monitor their blood pressure regularly and maintain a heart-healthy diet rich in fruits, vegetables, whole grains, and low-fat dairy, while reducing sodium intake to less than 2,300 mg per day. Regular physical activity, such as 30 minutes of moderate exercise most days of the week, will help lower blood pressure and improve overall cardiovascular health. Avoiding smoking, limiting alcohol consumption, and managing stress. Take  prescribed medication, & take it as directed and avoid skipping doses. Seek emergency care if your blood pressure is (over 180/100) or you experience chest pain, shortness of breath, or sudden vision changes.Patient verbalizes understanding regarding plan of care and all questions answered.

## 2023-04-17 NOTE — Assessment & Plan Note (Addendum)
Patient reports 4 migraines per week lasting about 2-4 hours per day. Trial on Topamax 25 mg once daily Propranolol 10 mg PRN Advise methods include deep breathing, cognitive behavioral therapy focusing on changing thoughts, Limit or avoid alcohol, caffeine, chocolate, canner foods, MSG and aspartame.  Implement sleep hygiene includes not watching TV or listen music in bed, don't eat heavy meals within a couple of hours of bedtime, don't use your phone, laptop, or tablet at bedtime

## 2023-04-26 DIAGNOSIS — F411 Generalized anxiety disorder: Secondary | ICD-10-CM | POA: Diagnosis not present

## 2023-04-26 DIAGNOSIS — F4310 Post-traumatic stress disorder, unspecified: Secondary | ICD-10-CM | POA: Diagnosis not present

## 2023-04-30 NOTE — Patient Instructions (Signed)

## 2023-04-30 NOTE — Progress Notes (Unsigned)
   Established Patient Office Visit   Subjective  Patient ID: Valerie Huber, female    DOB: 14-Sep-1985  Age: 37 y.o. MRN: [id number]  No chief complaint on file.   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  HPI  ROS    Objective:     LMP 12/08/2020  {Vitals History (Optional):23777}  Physical Exam   No results found for any visits on 05/01/23.  The ASCVD Risk score (Arnett DK, et al., 2019) failed to calculate for the following reasons:   The 2019 ASCVD risk score is only valid for ages 51 to 20    Assessment & Plan:  There are no diagnoses linked to this encounter.  No follow-ups on file.   Cruzita Lederer Newman Nip, FNP

## 2023-05-01 ENCOUNTER — Encounter (INDEPENDENT_AMBULATORY_CARE_PROVIDER_SITE_OTHER): Payer: Medicaid Other | Admitting: Family Medicine

## 2023-05-01 NOTE — Progress Notes (Signed)
NO SHOW

## 2023-05-03 DIAGNOSIS — F411 Generalized anxiety disorder: Secondary | ICD-10-CM | POA: Diagnosis not present

## 2023-05-03 DIAGNOSIS — F4310 Post-traumatic stress disorder, unspecified: Secondary | ICD-10-CM | POA: Diagnosis not present

## 2023-05-10 DIAGNOSIS — F411 Generalized anxiety disorder: Secondary | ICD-10-CM | POA: Diagnosis not present

## 2023-05-10 DIAGNOSIS — F4310 Post-traumatic stress disorder, unspecified: Secondary | ICD-10-CM | POA: Diagnosis not present

## 2023-05-14 ENCOUNTER — Encounter: Payer: Self-pay | Admitting: Internal Medicine

## 2023-05-16 ENCOUNTER — Ambulatory Visit: Payer: Medicaid Other | Admitting: Family Medicine

## 2023-05-16 ENCOUNTER — Encounter: Payer: Self-pay | Admitting: Family Medicine

## 2023-05-16 VITALS — BP 110/79 | HR 82 | Resp 17 | Ht 64.0 in | Wt 181.0 lb

## 2023-05-16 DIAGNOSIS — R030 Elevated blood-pressure reading, without diagnosis of hypertension: Secondary | ICD-10-CM

## 2023-05-16 DIAGNOSIS — R7303 Prediabetes: Secondary | ICD-10-CM | POA: Diagnosis not present

## 2023-05-16 DIAGNOSIS — B349 Viral infection, unspecified: Secondary | ICD-10-CM | POA: Diagnosis not present

## 2023-05-16 DIAGNOSIS — E782 Mixed hyperlipidemia: Secondary | ICD-10-CM | POA: Diagnosis not present

## 2023-05-16 DIAGNOSIS — D509 Iron deficiency anemia, unspecified: Secondary | ICD-10-CM | POA: Diagnosis not present

## 2023-05-16 MED ORDER — PROMETHAZINE-DM 6.25-15 MG/5ML PO SYRP: 5.0000 mL | ORAL_SOLUTION | Freq: Four times a day (QID) | ORAL | 0 refills | Status: DC | PRN
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
## 2023-05-16 NOTE — Assessment & Plan Note (Signed)
 The patient's blood pressure is currently controlled, with chart readings showing a systolic in the 120s and a diastolic in the 80s. The patient is asymptomatic at this time. I encouraged the patient to continue lifestyle modifications, including decreasing sodium intake and increasing physical activity. A heart-healthy diet rich in fruits, vegetables, whole grains, and low-fat dairy products was also recommended. The patient verbalized understanding and is aware of the plan of care. BP Readings from Last 3 Encounters:  05/16/23 110/79  04/17/23 (!) 133/91  04/16/23 106/64

## 2023-05-16 NOTE — Assessment & Plan Note (Signed)
 I will initiate therapy with Promethazine  DM 5 mL by mouth every 4 hours as needed for cough and cold symptoms. The patient was encouraged to increase her fluid intake to at least 64 ounces of warm fluids, such as tea, to promote throat comfort and hydration. Additionally, I recommended warm salt gargles 3-4 times daily to help with throat pain or discomfort, as well as sugar-free honey-based throat lozenges to soothe the sore throat.  The patient was encouraged to rest her vocal cords and avoid speaking or whispering excessively to allow them to heal. Using a humidifier or vaporizer in dry air was also advised to prevent irritation of the vocal cords. She was further advised to avoid irritants such as smoking, secondhand smoke, and exposure to chemicals, perfumes, and pollutants, which could worsen hoarseness. The patient was instructed to follow up in 7 to 10 days if her symptoms worsen or fail to improve with the current treatment regimen. The patient verbalized understanding and is aware of the plan of care.

## 2023-05-16 NOTE — Patient Instructions (Addendum)
 I appreciate the opportunity to provide care to you today!    Follow up:  3 months with pcp  Viral Illness:  I recommend symptomatic treatments with the following: Start taking Promethazine  DM 5 mL by mouth every 4 hours as needed for cough and cold symptoms. Increase fluid intake and allow for plenty of rest. Take Tylenol  as needed for pain, fever, or general discomfort. Perform warm saltwater gargles 3-4 times daily to help with throat pain or discomfort. (Mix 1/2 teaspoon of salt in a glass of warm water  and gargle several times daily to reduce throat inflammation and soothe irritation.) Look for sugar-free or honey-based throat lozenges to help with a sore throat. Use a humidifier at bedtime to help with cough and nasal congestion. For nasal congestion: The use of heated humidified air is a safe and effective therapy. Saline nasal sprays may also help alleviate nasal symptoms of the common cold. Follow up if your symptoms do not improve after 7 to 10 days of symptom onset.   Nonpharmacological interventions for hoarseness include:  Voice Rest: Avoid speaking or whispering excessively to allow the vocal cords to rest and heal.  Hydration: Drink plenty of fluids, particularly water , to keep the vocal cords moist. Herbal teas (such as chamomile or ginger) or warm water  with honey can also soothe the throat.  Humidification: Use a humidifier or vaporizer to add moisture to the air, especially in dry environments, to help prevent irritation of the vocal cords.  Avoid Irritants: Avoid smoking, secondhand smoke, and exposure to other irritants like strong chemicals, perfumes, and pollutants that may worsen hoarseness.  Steam Inhalation: Inhale steam from a bowl of hot water  or take a warm shower to help loosen mucus and soothe the throat.  Proper Vocal Technique: Use proper vocal techniques, such as speaking in a comfortable pitch and volume, to avoid straining the vocal cords.  Rest the  Voice: Avoid shouting, talking loudly, or speaking for long periods, which can further strain the vocal cords.  Avoid Throat Clearing: Try not to clear your throat frequently, as this can irritate the vocal cords.  Warm Saltwater Gargles: Gargling with warm saltwater can help soothe irritation in the throat.   Please continue to a heart-healthy diet and increase your physical activities. Try to exercise for at least five days a week.    It was a pleasure to see you and I look forward to continuing to work together on your health and well-being. Please do not hesitate to call the office if you need care or have questions about your care.  In case of emergency, please visit the Emergency Department for urgent care, or contact our clinic at [phone] to schedule an appointment. We're here to help you!   Have a wonderful day and week. With Gratitude, Mattew Chriswell MSN, FNP-BC

## 2023-05-17 LAB — LIPID PANEL
Chol/HDL Ratio: 3.3 {ratio} (ref 0.0–4.4)
Cholesterol, Total: 207 mg/dL — ABNORMAL HIGH (ref 100–199)
HDL: 62 mg/dL (ref >39)
LDL Chol Calc (NIH): 118 mg/dL — ABNORMAL HIGH (ref 0–99)
Triglycerides: 152 mg/dL — ABNORMAL HIGH (ref 0–149)
VLDL Cholesterol Cal: 27 mg/dL (ref 5–40)

## 2023-05-17 LAB — IRON,TIBC AND FERRITIN PANEL
Ferritin: 168 ng/mL — ABNORMAL HIGH (ref 15–150)
Iron Saturation: 24 % (ref 15–55)
Iron: 86 ug/dL (ref 27–159)
Total Iron Binding Capacity: 353 ug/dL (ref 250–450)
UIBC: 267 ug/dL (ref 131–425)

## 2023-05-17 LAB — VITAMIN B12: Vitamin B-12: 387 pg/mL (ref 232–1245)

## 2023-05-17 LAB — HEMOGLOBIN A1C
Est. average glucose Bld gHb Est-mCnc: 111 mg/dL
Hgb A1c MFr Bld: 5.5 % (ref 4.8–5.6)

## 2023-05-19 LAB — COVID-19, FLU A+B AND RSV
Influenza A, NAA: NOT DETECTED
Influenza B, NAA: NOT DETECTED
RSV, NAA: NOT DETECTED
SARS-CoV-2, NAA: NOT DETECTED

## 2023-05-19 NOTE — Progress Notes (Signed)
 Please inform the patient that she tested neg for covid, flu and RSV.

## 2023-05-24 DIAGNOSIS — F411 Generalized anxiety disorder: Secondary | ICD-10-CM | POA: Diagnosis not present

## 2023-05-24 DIAGNOSIS — F4310 Post-traumatic stress disorder, unspecified: Secondary | ICD-10-CM | POA: Diagnosis not present

## 2023-05-31 DIAGNOSIS — F4310 Post-traumatic stress disorder, unspecified: Secondary | ICD-10-CM | POA: Diagnosis not present

## 2023-05-31 DIAGNOSIS — F411 Generalized anxiety disorder: Secondary | ICD-10-CM | POA: Diagnosis not present

## 2023-06-02 ENCOUNTER — Other Ambulatory Visit: Payer: Self-pay | Admitting: Family Medicine

## 2023-06-02 DIAGNOSIS — B349 Viral infection, unspecified: Secondary | ICD-10-CM

## 2023-06-03 MED ORDER — PROMETHAZINE-DM 6.25-15 MG/5ML PO SYRP: 5.0000 mL | ORAL_SOLUTION | Freq: Four times a day (QID) | ORAL | 0 refills | Status: AC | PRN

## 2023-06-14 DIAGNOSIS — F4310 Post-traumatic stress disorder, unspecified: Secondary | ICD-10-CM | POA: Diagnosis not present

## 2023-06-14 DIAGNOSIS — F411 Generalized anxiety disorder: Secondary | ICD-10-CM | POA: Diagnosis not present

## 2023-08-15 ENCOUNTER — Ambulatory Visit: Payer: Medicaid Other | Admitting: Internal Medicine

## 2023-08-21 ENCOUNTER — Encounter: Payer: Self-pay | Admitting: Internal Medicine

## 2023-08-22 ENCOUNTER — Ambulatory Visit: Admitting: Internal Medicine

## 2023-09-24 ENCOUNTER — Telehealth: Payer: Self-pay | Admitting: Internal Medicine

## 2023-10-02 ENCOUNTER — Ambulatory Visit
Admission: EM | Admit: 2023-10-02 | Discharge: 2023-10-02 | Disposition: A | Discharge status: Home / Self Care | Attending: Nurse Practitioner | Admitting: Nurse Practitioner

## 2023-10-02 DIAGNOSIS — R21 Rash and other nonspecific skin eruption: Secondary | ICD-10-CM | POA: Diagnosis not present

## 2023-10-02 DIAGNOSIS — L282 Other prurigo: Secondary | ICD-10-CM | POA: Diagnosis not present

## 2023-10-02 MED ORDER — DEXAMETHASONE SODIUM PHOSPHATE 10 MG/ML IJ SOLN
10.0000 mg | INTRAMUSCULAR | Status: AC
  Administered 2023-10-02: 10 mg via INTRAMUSCULAR

## 2023-10-02 MED ORDER — PREDNISONE 20 MG PO TABS
40.0000 mg | ORAL_TABLET | Freq: Every day | ORAL | 0 refills | Status: AC
Start: 2023-10-02 — End: 2023-10-07

## 2023-10-02 MED ORDER — TRIAMCINOLONE ACETONIDE 0.1 % EX CREA: 1.0000 | TOPICAL_CREAM | Freq: Two times a day (BID) | CUTANEOUS | 0 refills | Status: AC

## 2023-10-02 NOTE — ED Triage Notes (Addendum)
 Pt reports insect bites the right forearm, left  wrist  causing swelling, redness, pain, burning and itching, has tried benadryl  and hydrocortisone cream and cool wash clothes has not found relief x 1 day

## 2023-10-02 NOTE — ED Provider Notes (Signed)
 [facility]    CSN: [id number] Arrival date & time: 10/02/23  1049      History   Chief Complaint No chief complaint on file.   HPI Valerie Huber is a 38 y.o. female.   The history is provided by the patient.   Patient presents for possible insect bite to the right forearm, rash to the left forearm, and erythematous induration to the nape of her neck.  Patient states insect bite occurred last evening.  She states immediately after the bite, she noticed redness, but since then the redness has moderately increased in size.  Patient states that the area on the right forearm is "itching and burning."  Patient states that the rash to her left forearm also started around the same time.  She said it is also itchy.  Patient denies exposure to new soaps, medications, lotions, foods, or detergents.  States that she did wrap the right forearm and cool compresses, use over-the-counter hydrocortisone cream, and took Benadryl  for her symptoms with minimal relief.  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   Endometriosis    Irregular periods 12/31/2014   Pelvic pain in female 12/31/2014   Second degree uterine prolapse 12/31/2014   Seizures (HCC)    per pt last seizure 2013  no meds   Vaginal Pap smear, abnormal     Patient Active Problem List   Diagnosis Date Noted   Viral illness 05/16/2023   Migraines 04/17/2023   Elevated blood pressure reading 04/17/2023   Vitamin D  deficiency 06/08/2022   Encounter for general adult medical examination with abnormal findings 06/07/2022   Refused influenza vaccine 06/07/2022   Acute right ankle pain 01/26/2022   Mass of upper outer quadrant of left breast 12/14/2021   Gastroesophageal reflux disease without esophagitis 09/07/2021   OSA (obstructive sleep apnea) 09/07/2021   History of endometriosis    Seasonal allergic rhinitis due to pollen 09/01/2020   Encounter  for examination following treatment at hospital 04/12/2020   Chronic diarrhea 06/30/2019   Rectal bleeding    Complex regional pain syndrome type 1 of right lower extremity 05/20/2017   MDD (major depressive disorder), recurrent episode (HCC) 11/30/2015   H/O: substance abuse (HCC) 11/30/2015   History of seizures 11/30/2015   Marijuana use 07/05/2015   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/21/2014    Past Surgical History:  Procedure Laterality Date   ANKLE SURGERY Right 09/14/2021   BIOPSY  07/06/2019   Procedure: BIOPSY;  Surgeon: Suzette Espy, MD;  Location: [facility];  Service: Endoscopy;;  colon    COLONOSCOPY WITH PROPOFOL  N/A 07/06/2019   one 3 mm polyp hyperplastic, segmental biopsies negative   DILITATION & CURRETTAGE/HYSTROSCOPY WITH NOVASURE ABLATION N/A 10/08/2017   Procedure: DILATATION & CURETTAGE/HYSTEROSCOPY WITH MINERVA  ENDOMETRIAL ABLATION (Procedure #2);  Surgeon: Albino Hum, MD;  Location: [facility];  Service: Gynecology;  Laterality: N/A;   ESOPHAGOGASTRODUODENOSCOPY N/A 12/10/2014   SLF: hematoemesis due to moderate erosive gastritis, duoedentitis, and duodenal ulcer   FOOT ARTHRODESIS Right 02/13/2022   Procedure: RIGHT FOOT EXCISION OF MEDIAL CALCANEAL HYPERTROPHY, TALUS HYPERTROPHY, SUBTALAR JOINT ARTHRODESIS;  Surgeon: Donnamarie Gables, MD;  Location: [facility];  Service: Orthopedics;  Laterality: Right;   LEEP     POLYPECTOMY  07/06/2019   Procedure: POLYPECTOMY;  Surgeon: Suzette Espy, MD;  Location: [facility];  Service: Endoscopy;;   surgery for  endometriosis  2005   TUBAL LIGATION Bilateral 10/08/2017   Procedure: BILATERAL TUBAL STERILIZATION WITH FALLOPE RINGS APPLICATION (Procedure #1);  Surgeon: Albino Hum, MD;  Location: [facility];  Service: Gynecology;  Laterality: Bilateral;   VAGINAL HYSTERECTOMY N/A 12/14/2020   Procedure: TOTAL HYSTERECTOMY VAGINAL;  Surgeon: Wendelyn Halter, MD;  Location: [facility];   Service: Gynecology;  Laterality: N/A;    OB History     Gravida  3   Para  2   Term  2   Preterm      AB  1   Living  2      SAB      IAB  1   Ectopic      Multiple  0   Live Births  2            Home Medications    Prior to Admission medications   Medication Sig Start Date End Date Taking? Authorizing Provider  predniSONE  (DELTASONE ) 20 MG tablet Take 2 tablets (40 mg total) by mouth daily with breakfast for 5 days. 10/02/23 10/07/23 Yes Leath-Warren, Belen Bowers, NP  triamcinolone cream (KENALOG) 0.1 % Apply 1 Application topically 2 (two) times daily. 10/02/23  Yes Leath-Warren, Belen Bowers, NP  Blood Pressure Monitoring (BLOOD PRESSURE KIT) DEVI Use daily Patient not taking: Reported on 05/16/2023 04/17/23   Del Orbe Polanco, Iliana, FNP  gabapentin  (NEURONTIN ) 300 MG capsule Take 300 mg by mouth every 8 (eight) hours. 03/27/22   [provider]  levocetirizine (XYZAL ) 5 MG tablet Take 1 tablet (5 mg total) by mouth every evening. Patient not taking: Reported on 05/16/2023 04/17/23   Del Orbe Polanco, Iliana, FNP  Melatonin 1 MG CHEW Chew 1 tablet by mouth at bedtime. Patient not taking: Reported on 05/16/2023    [provider]  methocarbamol  (ROBAXIN ) 500 MG tablet Take 500 mg by mouth every 8 (eight) hours. Patient not taking: Reported on 05/16/2023 03/27/22   [provider]  pantoprazole  (PROTONIX ) 40 MG tablet Take 1 tablet (40 mg total) by mouth daily. Patient not taking: Reported on 05/16/2023 04/17/23   Del Orbe Polanco, Iliana, FNP  promethazine -dextromethorphan (PROMETHAZINE -DM) 6.25-15 MG/5ML syrup Take 5 mLs by mouth 4 (four) times daily as needed. 06/03/23   Meldon Sport, MD  propranolol  (INDERAL ) 10 MG tablet Take 1 tablet (10 mg total) by mouth every 12 (twelve) hours as needed. Patient not taking: Reported on 05/16/2023 04/17/23   Del Orbe Polanco, Iliana, FNP  topiramate  (TOPAMAX ) 25 MG tablet Take 1 tablet (25 mg total) by mouth  daily. Patient not taking: Reported on 05/16/2023 04/17/23   Del Orbe Polanco, Iliana, FNP  Vitamin D , Ergocalciferol , (DRISDOL ) 1.25 MG (50000 UNIT) CAPS capsule Take 1 capsule (50,000 Units total) by mouth every 7 (seven) days. Patient not taking: Reported on 05/16/2023 04/17/23   Del Abron Abt, FNP    Family History Family History  Problem Relation Age of Onset   Cancer Mother        kidney    Asthma Mother    Hypertension Mother    Colon polyps Sister        92, large polyp, done at [facility]    Cancer Paternal Grandmother    Asthma Son    Colon cancer Neg Hx     Social History Social History   Tobacco Use   Smoking status: Former    Current packs/day: 0.25    Average packs/day: 0.3 packs/day for 14.0 years (3.5  ttl pk-yrs)    Types: Cigarettes   Smokeless tobacco: Never   Tobacco comments:    quit in 2017   Vaping Use   Vaping status: Never Used  Substance Use Topics   Alcohol use: Not Currently   Drug use: Not Currently    Types: Marijuana    Comment: previous -none in 8 months     Allergies   Flexeril  [cyclobenzaprine ] and Ibuprofen    Review of Systems Review of Systems Per HPI  Physical Exam Triage Vital Signs ED Triage Vitals  Encounter Vitals Group     BP 10/02/23 1127 (!) 140/91     Systolic BP Percentile --      Diastolic BP Percentile --      Pulse Rate 10/02/23 1127 96     Resp 10/02/23 1127 18     Temp 10/02/23 1127 98.7 F (37.1 C)     Temp Source 10/02/23 1127 Oral     SpO2 10/02/23 1127 97 %     Weight --      Height --      Head Circumference --      Peak Flow --      Pain Score 10/02/23 1131 8     Pain Loc --      Pain Education --      Exclude from Growth Chart --    No data found.  Updated Vital Signs BP (!) 140/91 (BP Location: Right Arm)   Pulse 96   Temp 98.7 F (37.1 C) (Oral)   Resp 18   LMP 12/08/2020   SpO2 97%   Visual Acuity Right Eye Distance:   Left Eye Distance:   Bilateral Distance:    Right Eye  Near:   Left Eye Near:    Bilateral Near:     Physical Exam Vitals and nursing note reviewed.  Constitutional:      General: She is not in acute distress.    Appearance: Normal appearance.  HENT:     Head: Normocephalic.  Eyes:     Extraocular Movements: Extraocular movements intact.     Pupils: Pupils are equal, round, and reactive to light.  Pulmonary:     Effort: Pulmonary effort is normal.  Musculoskeletal:     Cervical back: Normal range of motion.  Skin:    General: Skin is warm and dry.     Findings: Erythema and rash present.       Neurological:     General: No focal deficit present.     Mental Status: She is alert and oriented to person, place, and time.  Psychiatric:        Mood and Affect: Mood normal.        Behavior: Behavior normal.      UC Treatments / Results  Labs (all labs ordered are listed, but only abnormal results are displayed) Labs Reviewed - No data to display  EKG   Radiology No results found.  Procedures Procedures (including critical care time)  Medications Ordered in UC Medications  dexamethasone  (DECADRON ) injection 10 mg (has no administration in time range)    Initial Impression / Assessment and Plan / UC Course  I have reviewed the triage vital signs and the nursing notes.  Pertinent labs & imaging results that were available during my care of the patient were reviewed by me and considered in my medical decision making (see chart for details).  Decadron  10 mg IM administered for itching and inflammation.  Patient with papular urticaria  noted to the right forearm, with rash and nonspecific skin eruption noted to the left forearm and neck.  Will treat with prednisone  40 mg for the next 5 days, and triamcinolone cream 0.1% to apply topically to the affected areas.  Supportive care recommendations were provided and discussed with the patient to include continuing over-the-counter antihistamines, cool compresses to the affected  area, and to avoid scratching or manipulating the areas.  Patient was given indications regarding follow-up.  Patient was in agreement with this plan of care and verbalizes understanding.  All questions were answered.  Patient stable for discharge.  Final Clinical Impressions(s) / UC Diagnoses   Final diagnoses:  Papular urticaria  Rash and nonspecific skin eruption   Discharge Instructions   None    ED Prescriptions     Medication Sig Dispense Auth. Provider   predniSONE  (DELTASONE ) 20 MG tablet Take 2 tablets (40 mg total) by mouth daily with breakfast for 5 days. 10 tablet Leath-Warren, Belen Bowers, NP   triamcinolone cream (KENALOG) 0.1 % Apply 1 Application topically 2 (two) times daily. 45 g Leath-Warren, Belen Bowers, NP      PDMP not reviewed this encounter.   Hardy Lia, NP 10/02/23 1525

## 2023-10-02 NOTE — Discharge Instructions (Signed)
 You were given an injection of Decadron  10mg . Start the prednisone  on 10/03/23. Take medication as prescribed. May also take over-the-counter Zyrtec during the daytime and Benadryl  at bedtime to help with itching. Avoid hot baths or showers while symptoms persist.  Recommend taking lukewarm baths. May apply cool cloths to the area to help with itching or discomfort. Avoid scratching, rubbing, or manipulating the areas while symptoms persist. Recommend Aveeno colloidal oatmeal bath to use to help with drying and itching. Follow up if symptoms do not improve.

## 2023-10-21 ENCOUNTER — Ambulatory Visit: Payer: Self-pay | Admitting: Internal Medicine

## 2023-10-22 DIAGNOSIS — I10 Essential (primary) hypertension: Secondary | ICD-10-CM | POA: Diagnosis not present

## 2023-10-22 DIAGNOSIS — R42 Dizziness and giddiness: Secondary | ICD-10-CM | POA: Diagnosis not present

## 2023-10-22 DIAGNOSIS — G4489 Other headache syndrome: Secondary | ICD-10-CM | POA: Diagnosis not present

## 2023-10-22 DIAGNOSIS — R000 Tachycardia, unspecified: Secondary | ICD-10-CM | POA: Diagnosis not present

## 2023-10-29 ENCOUNTER — Encounter: Payer: Self-pay | Admitting: Internal Medicine

## 2023-11-04 ENCOUNTER — Encounter: Payer: Self-pay | Admitting: Internal Medicine

## 2023-11-04 ENCOUNTER — Telehealth: Payer: Self-pay

## 2023-11-04 ENCOUNTER — Telehealth: Payer: Self-pay | Admitting: Internal Medicine

## 2023-11-04 NOTE — Telephone Encounter (Signed)
 Patient has missed more than three appointments in rolling 54-month period. Per no show policy, patient can be considered for dismissal.   Alternatively, registrars can contact patient to reschedule missed appointment and communicate NS policy. Please advise.

## 2023-11-04 NOTE — Telephone Encounter (Signed)
Dismissal Letter Sent

## 2023-11-04 NOTE — Telephone Encounter (Signed)
 error

## 2024-01-03 ENCOUNTER — Encounter: Payer: Self-pay | Admitting: Radiology

## 2024-03-16 ENCOUNTER — Encounter: Payer: Self-pay | Admitting: Radiology

## 2024-03-30 DIAGNOSIS — R200 Anesthesia of skin: Secondary | ICD-10-CM | POA: Diagnosis not present

## 2024-03-30 DIAGNOSIS — M62838 Other muscle spasm: Secondary | ICD-10-CM | POA: Diagnosis not present

## 2024-03-30 DIAGNOSIS — M542 Cervicalgia: Secondary | ICD-10-CM | POA: Diagnosis not present

## 2024-03-30 DIAGNOSIS — M5410 Radiculopathy, site unspecified: Secondary | ICD-10-CM | POA: Diagnosis not present
